# Patient Record
Sex: Male | Born: 1935
Health system: Southern US, Community
[De-identification: ages and names within clinical notes are randomized; demographics above are authoritative.]

## PROBLEM LIST (undated history)

## (undated) DIAGNOSIS — Z9289 Personal history of other medical treatment: Secondary | ICD-10-CM

## (undated) DIAGNOSIS — L0291 Cutaneous abscess, unspecified: Secondary | ICD-10-CM

## (undated) DIAGNOSIS — I219 Acute myocardial infarction, unspecified: Secondary | ICD-10-CM

## (undated) DIAGNOSIS — Z85038 Personal history of other malignant neoplasm of large intestine: Secondary | ICD-10-CM

## (undated) DIAGNOSIS — I482 Chronic atrial fibrillation, unspecified: Secondary | ICD-10-CM

## (undated) DIAGNOSIS — B957 Other staphylococcus as the cause of diseases classified elsewhere: Secondary | ICD-10-CM

## (undated) DIAGNOSIS — R55 Syncope and collapse: Secondary | ICD-10-CM

## (undated) DIAGNOSIS — Z86010 Personal history of colonic polyps: Secondary | ICD-10-CM

## (undated) DIAGNOSIS — I493 Ventricular premature depolarization: Secondary | ICD-10-CM

## (undated) DIAGNOSIS — I10 Essential (primary) hypertension: Secondary | ICD-10-CM

## (undated) DIAGNOSIS — E785 Hyperlipidemia, unspecified: Secondary | ICD-10-CM

## (undated) DIAGNOSIS — E079 Disorder of thyroid, unspecified: Secondary | ICD-10-CM

## (undated) DIAGNOSIS — T8149XA Infection following a procedure, other surgical site, initial encounter: Secondary | ICD-10-CM

## (undated) DIAGNOSIS — Z860101 Personal history of adenomatous and serrated colon polyps: Secondary | ICD-10-CM

## (undated) DIAGNOSIS — C801 Malignant (primary) neoplasm, unspecified: Secondary | ICD-10-CM

## (undated) HISTORY — PX: POLYPECTOMY: SHX149

## (undated) HISTORY — DX: Cutaneous abscess, unspecified: L02.91

## (undated) HISTORY — DX: Ventricular premature depolarization: I49.3

## (undated) HISTORY — DX: Infection following a procedure, other surgical site, initial encounter: T81.49XA

## (undated) HISTORY — DX: Other staphylococcus as the cause of diseases classified elsewhere: B95.7

## (undated) HISTORY — DX: Hyperlipidemia, unspecified: E78.5

## (undated) HISTORY — DX: Personal history of colonic polyps: Z86.010

## (undated) HISTORY — DX: Personal history of other medical treatment: Z92.89

## (undated) HISTORY — DX: Personal history of other malignant neoplasm of large intestine: Z85.038

## (undated) HISTORY — DX: Disorder of thyroid, unspecified: E07.9

## (undated) HISTORY — PX: INCISE AND DRAIN ABCESS: PRO64

## (undated) HISTORY — DX: Essential (primary) hypertension: I10

## (undated) HISTORY — PX: EYE SURGERY: SHX253

## (undated) HISTORY — DX: Syncope and collapse: R55

## (undated) HISTORY — DX: Chronic atrial fibrillation, unspecified: I48.20

## (undated) HISTORY — DX: Personal history of adenomatous and serrated colon polyps: Z86.0101

## (undated) HISTORY — DX: Malignant (primary) neoplasm, unspecified: C80.1

---

## 1975-05-16 HISTORY — PX: LUMBAR LAMINECTOMY: SHX95

## 1983-05-16 DIAGNOSIS — I219 Acute myocardial infarction, unspecified: Secondary | ICD-10-CM

## 1983-05-16 HISTORY — DX: Acute myocardial infarction, unspecified: I21.9

## 1998-05-15 DIAGNOSIS — Z85038 Personal history of other malignant neoplasm of large intestine: Secondary | ICD-10-CM

## 1998-05-15 HISTORY — DX: Personal history of other malignant neoplasm of large intestine: Z85.038

## 1998-05-15 HISTORY — PX: COLECTOMY: SHX59

## 1998-07-20 ENCOUNTER — Other Ambulatory Visit: Admission: RE | Admit: 1998-07-20 | Discharge: 1998-07-20 | Payer: Self-pay | Admitting: Gastroenterology

## 1998-07-22 ENCOUNTER — Inpatient Hospital Stay (HOSPITAL_COMMUNITY): Admission: RE | Admit: 1998-07-22 | Discharge: 1998-07-28 | Payer: Self-pay

## 1998-11-29 ENCOUNTER — Ambulatory Visit (HOSPITAL_COMMUNITY): Admission: RE | Admit: 1998-11-29 | Discharge: 1998-11-29 | Payer: Self-pay

## 1999-02-07 ENCOUNTER — Ambulatory Visit (HOSPITAL_COMMUNITY): Admission: RE | Admit: 1999-02-07 | Discharge: 1999-02-07 | Payer: Self-pay | Admitting: Oncology

## 1999-02-07 ENCOUNTER — Encounter: Payer: Self-pay | Admitting: Oncology

## 1999-04-07 ENCOUNTER — Emergency Department (HOSPITAL_COMMUNITY): Admission: EM | Admit: 1999-04-07 | Discharge: 1999-04-07 | Payer: Self-pay | Admitting: Emergency Medicine

## 1999-04-14 ENCOUNTER — Encounter (INDEPENDENT_AMBULATORY_CARE_PROVIDER_SITE_OTHER): Payer: Self-pay | Admitting: Specialist

## 1999-04-14 ENCOUNTER — Other Ambulatory Visit: Admission: RE | Admit: 1999-04-14 | Discharge: 1999-04-14 | Payer: Self-pay | Admitting: Gastroenterology

## 1999-05-05 ENCOUNTER — Ambulatory Visit (HOSPITAL_BASED_OUTPATIENT_CLINIC_OR_DEPARTMENT_OTHER): Admission: RE | Admit: 1999-05-05 | Discharge: 1999-05-05 | Payer: Self-pay

## 2000-01-12 ENCOUNTER — Encounter: Admission: RE | Admit: 2000-01-12 | Discharge: 2000-04-11 | Payer: Self-pay | Admitting: Oncology

## 2000-07-17 ENCOUNTER — Encounter: Payer: Self-pay | Admitting: Oncology

## 2000-07-17 ENCOUNTER — Ambulatory Visit (HOSPITAL_COMMUNITY): Admission: RE | Admit: 2000-07-17 | Discharge: 2000-07-17 | Payer: Self-pay | Admitting: Oncology

## 2000-08-29 ENCOUNTER — Encounter: Payer: Self-pay | Admitting: Urology

## 2000-08-29 ENCOUNTER — Ambulatory Visit (HOSPITAL_COMMUNITY): Admission: RE | Admit: 2000-08-29 | Discharge: 2000-08-29 | Payer: Self-pay | Admitting: Urology

## 2002-09-13 HISTORY — PX: HERNIA REPAIR: SHX51

## 2002-09-16 ENCOUNTER — Ambulatory Visit (HOSPITAL_COMMUNITY): Admission: RE | Admit: 2002-09-16 | Discharge: 2002-09-16 | Payer: Self-pay | Admitting: Surgery

## 2004-01-08 ENCOUNTER — Emergency Department (HOSPITAL_COMMUNITY): Admission: EM | Admit: 2004-01-08 | Discharge: 2004-01-08 | Payer: Self-pay | Admitting: Emergency Medicine

## 2004-05-15 HISTORY — PX: WOUND EXPLORATION: SHX2669

## 2004-07-21 ENCOUNTER — Encounter (INDEPENDENT_AMBULATORY_CARE_PROVIDER_SITE_OTHER): Payer: Self-pay | Admitting: *Deleted

## 2004-07-21 ENCOUNTER — Ambulatory Visit (HOSPITAL_BASED_OUTPATIENT_CLINIC_OR_DEPARTMENT_OTHER): Admission: RE | Admit: 2004-07-21 | Discharge: 2004-07-21 | Payer: Self-pay | Admitting: General Surgery

## 2004-07-21 ENCOUNTER — Ambulatory Visit (HOSPITAL_COMMUNITY): Admission: RE | Admit: 2004-07-21 | Discharge: 2004-07-21 | Payer: Self-pay | Admitting: General Surgery

## 2004-08-11 ENCOUNTER — Ambulatory Visit: Payer: Self-pay | Admitting: Oncology

## 2004-08-24 ENCOUNTER — Ambulatory Visit (HOSPITAL_COMMUNITY): Admission: RE | Admit: 2004-08-24 | Discharge: 2004-08-24 | Payer: Self-pay | Admitting: Oncology

## 2004-09-08 ENCOUNTER — Ambulatory Visit (HOSPITAL_COMMUNITY): Admission: RE | Admit: 2004-09-08 | Discharge: 2004-09-08 | Payer: Self-pay | Admitting: Oncology

## 2004-11-04 ENCOUNTER — Ambulatory Visit: Payer: Self-pay | Admitting: Oncology

## 2005-05-04 ENCOUNTER — Ambulatory Visit (HOSPITAL_COMMUNITY): Admission: RE | Admit: 2005-05-04 | Discharge: 2005-05-04 | Payer: Self-pay | Admitting: Oncology

## 2005-05-17 ENCOUNTER — Ambulatory Visit: Payer: Self-pay | Admitting: Gastroenterology

## 2005-06-01 ENCOUNTER — Ambulatory Visit: Payer: Self-pay | Admitting: Oncology

## 2005-07-04 ENCOUNTER — Ambulatory Visit: Payer: Self-pay | Admitting: Infectious Diseases

## 2005-11-20 ENCOUNTER — Ambulatory Visit: Payer: Self-pay | Admitting: Infectious Diseases

## 2006-01-24 ENCOUNTER — Ambulatory Visit: Payer: Self-pay | Admitting: Infectious Diseases

## 2006-02-15 ENCOUNTER — Encounter: Payer: Self-pay | Admitting: Infectious Diseases

## 2006-05-30 ENCOUNTER — Ambulatory Visit: Payer: Self-pay | Admitting: Oncology

## 2006-06-04 LAB — CBC WITH DIFFERENTIAL/PLATELET
BASO%: 1 % (ref 0.0–2.0)
EOS%: 1.9 % (ref 0.0–7.0)
HCT: 43.3 % (ref 38.7–49.9)
MCH: 30.1 pg (ref 28.0–33.4)
MCHC: 34.4 g/dL (ref 32.0–35.9)
MONO#: 0.3 10*3/uL (ref 0.1–0.9)
NEUT%: 56.9 % (ref 40.0–75.0)
RBC: 4.95 10*6/uL (ref 4.20–5.71)
RDW: 10.8 % — ABNORMAL LOW (ref 11.2–14.6)
WBC: 5.4 10*3/uL (ref 4.0–10.0)
lymph#: 1.8 10*3/uL (ref 0.9–3.3)

## 2006-06-04 LAB — COMPREHENSIVE METABOLIC PANEL
ALT: 26 U/L (ref 0–53)
AST: 23 U/L (ref 0–37)
Calcium: 9.6 mg/dL (ref 8.4–10.5)
Chloride: 104 mEq/L (ref 96–112)
Creatinine, Ser: 0.85 mg/dL (ref 0.40–1.50)
Sodium: 144 mEq/L (ref 135–145)
Total Protein: 6.5 g/dL (ref 6.0–8.3)

## 2006-06-04 LAB — LACTATE DEHYDROGENASE: LDH: 154 U/L (ref 94–250)

## 2006-12-12 ENCOUNTER — Ambulatory Visit (HOSPITAL_COMMUNITY): Admission: RE | Admit: 2006-12-12 | Discharge: 2006-12-12 | Payer: Self-pay | Admitting: Infectious Diseases

## 2006-12-12 ENCOUNTER — Ambulatory Visit: Payer: Self-pay | Admitting: Infectious Diseases

## 2006-12-12 DIAGNOSIS — B957 Other staphylococcus as the cause of diseases classified elsewhere: Secondary | ICD-10-CM | POA: Insufficient documentation

## 2006-12-12 DIAGNOSIS — S0000XA Unspecified superficial injury of scalp, initial encounter: Secondary | ICD-10-CM | POA: Insufficient documentation

## 2006-12-12 DIAGNOSIS — S1090XA Unspecified superficial injury of unspecified part of neck, initial encounter: Secondary | ICD-10-CM

## 2006-12-12 DIAGNOSIS — S0080XA Unspecified superficial injury of other part of head, initial encounter: Secondary | ICD-10-CM

## 2006-12-12 DIAGNOSIS — G4700 Insomnia, unspecified: Secondary | ICD-10-CM | POA: Insufficient documentation

## 2006-12-12 HISTORY — DX: Other staphylococcus as the cause of diseases classified elsewhere: B95.7

## 2007-05-16 HISTORY — PX: COLONOSCOPY: SHX174

## 2007-05-23 ENCOUNTER — Ambulatory Visit: Payer: Self-pay | Admitting: Gastroenterology

## 2007-05-30 ENCOUNTER — Ambulatory Visit: Payer: Self-pay | Admitting: Oncology

## 2007-05-31 ENCOUNTER — Ambulatory Visit: Payer: Self-pay | Admitting: Gastroenterology

## 2007-05-31 ENCOUNTER — Encounter: Payer: Self-pay | Admitting: Gastroenterology

## 2007-06-04 LAB — COMPREHENSIVE METABOLIC PANEL
ALT: 42 U/L (ref 0–53)
AST: 33 U/L (ref 0–37)
Calcium: 9.5 mg/dL (ref 8.4–10.5)
Chloride: 104 mEq/L (ref 96–112)
Creatinine, Ser: 0.96 mg/dL (ref 0.40–1.50)
Sodium: 142 mEq/L (ref 135–145)
Total Protein: 6.9 g/dL (ref 6.0–8.3)

## 2007-06-04 LAB — CBC WITH DIFFERENTIAL/PLATELET
BASO%: 0.4 % (ref 0.0–2.0)
Basophils Absolute: 0 10*3/uL (ref 0.0–0.1)
EOS%: 2.3 % (ref 0.0–7.0)
Eosinophils Absolute: 0.1 10*3/uL (ref 0.0–0.5)
HCT: 45.6 % (ref 38.7–49.9)
HGB: 15.5 g/dL (ref 13.0–17.1)
LYMPH%: 30 % (ref 14.0–48.0)
MCH: 29.3 pg (ref 28.0–33.4)
MCHC: 34 g/dL (ref 32.0–35.9)
MCV: 86.1 fL (ref 81.6–98.0)
MONO#: 0.5 10*3/uL (ref 0.1–0.9)
MONO%: 8.6 % (ref 0.0–13.0)
NEUT#: 3.6 10*3/uL (ref 1.5–6.5)
NEUT%: 58.7 % (ref 40.0–75.0)
Platelets: 235 10*3/uL (ref 145–400)
RBC: 5.3 10*6/uL (ref 4.20–5.71)
RDW: 12.7 % (ref 11.2–14.6)
WBC: 6.2 10*3/uL (ref 4.0–10.0)
lymph#: 1.8 10*3/uL (ref 0.9–3.3)

## 2007-06-04 LAB — CEA: CEA: 0.9 ng/mL (ref 0.0–5.0)

## 2008-01-28 ENCOUNTER — Encounter: Admission: RE | Admit: 2008-01-28 | Discharge: 2008-01-28 | Payer: Self-pay | Admitting: Internal Medicine

## 2008-05-29 ENCOUNTER — Ambulatory Visit: Payer: Self-pay | Admitting: Oncology

## 2008-06-02 LAB — COMPREHENSIVE METABOLIC PANEL
ALT: 52 U/L (ref 0–53)
Alkaline Phosphatase: 57 U/L (ref 39–117)
Creatinine, Ser: 0.53 mg/dL (ref 0.40–1.50)
Sodium: 143 mEq/L (ref 135–145)
Total Bilirubin: 0.9 mg/dL (ref 0.3–1.2)
Total Protein: 6.1 g/dL (ref 6.0–8.3)

## 2008-06-02 LAB — CBC WITH DIFFERENTIAL/PLATELET
BASO%: 0.5 % (ref 0.0–2.0)
EOS%: 1.1 % (ref 0.0–7.0)
HCT: 42.1 % (ref 38.7–49.9)
HGB: 14 g/dL (ref 13.0–17.1)
LYMPH%: 37.5 % (ref 14.0–48.0)
MONO#: 0.6 10*3/uL (ref 0.1–0.9)
MONO%: 16.4 % — ABNORMAL HIGH (ref 0.0–13.0)
NEUT#: 1.7 10*3/uL (ref 1.5–6.5)
RDW: 11.4 % (ref 11.2–14.6)
lymph#: 1.4 10*3/uL (ref 0.9–3.3)

## 2008-06-09 ENCOUNTER — Encounter: Payer: Self-pay | Admitting: Gastroenterology

## 2008-06-09 LAB — MORPHOLOGY
PLT EST: ADEQUATE
RBC Comments: NORMAL

## 2008-06-09 LAB — CBC WITH DIFFERENTIAL/PLATELET
BASO%: 0.4 % (ref 0.0–2.0)
Eosinophils Absolute: 0 10*3/uL (ref 0.0–0.5)
MCHC: 34.1 g/dL (ref 32.0–35.9)
MONO#: 0.8 10*3/uL (ref 0.1–0.9)
NEUT#: 2 10*3/uL (ref 1.5–6.5)
Platelets: 152 10*3/uL (ref 145–400)
RBC: 4.89 10*6/uL (ref 4.20–5.71)
RDW: 11.6 % (ref 11.2–14.6)
WBC: 4.4 10*3/uL (ref 4.0–10.0)
lymph#: 1.4 10*3/uL (ref 0.9–3.3)

## 2008-06-09 LAB — CHCC SMEAR

## 2008-06-17 ENCOUNTER — Encounter (HOSPITAL_COMMUNITY): Admission: RE | Admit: 2008-06-17 | Discharge: 2008-09-15 | Payer: Self-pay | Admitting: Endocrinology

## 2008-07-06 LAB — CBC WITH DIFFERENTIAL/PLATELET
Basophils Absolute: 0 10*3/uL (ref 0.0–0.1)
Eosinophils Absolute: 0.1 10*3/uL (ref 0.0–0.5)
HCT: 40.5 % (ref 38.4–49.9)
HGB: 13.7 g/dL (ref 13.0–17.1)
LYMPH%: 31.9 % (ref 14.0–49.0)
MONO#: 0.5 10*3/uL (ref 0.1–0.9)
NEUT#: 2.3 10*3/uL (ref 1.5–6.5)
NEUT%: 53.7 % (ref 39.0–75.0)
Platelets: 160 10*3/uL (ref 140–400)
WBC: 4.3 10*3/uL (ref 4.0–10.3)
lymph#: 1.4 10*3/uL (ref 0.9–3.3)

## 2008-07-06 LAB — CHCC SMEAR

## 2008-07-06 LAB — HEPATIC FUNCTION PANEL
Albumin: 3.8 g/dL (ref 3.5–5.2)
Total Bilirubin: 0.6 mg/dL (ref 0.3–1.2)
Total Protein: 6 g/dL (ref 6.0–8.3)

## 2008-07-06 LAB — MORPHOLOGY: PLT EST: ADEQUATE

## 2008-12-04 ENCOUNTER — Encounter: Payer: Self-pay | Admitting: Family Medicine

## 2009-04-26 ENCOUNTER — Encounter: Admission: RE | Admit: 2009-04-26 | Discharge: 2009-04-26 | Payer: Self-pay | Admitting: *Deleted

## 2009-05-28 ENCOUNTER — Ambulatory Visit: Payer: Self-pay | Admitting: Oncology

## 2009-06-08 ENCOUNTER — Encounter: Payer: Self-pay | Admitting: Gastroenterology

## 2009-06-08 LAB — MORPHOLOGY: PLT EST: ADEQUATE

## 2009-06-08 LAB — CBC WITH DIFFERENTIAL/PLATELET
Basophils Absolute: 0 10*3/uL (ref 0.0–0.1)
EOS%: 2.5 % (ref 0.0–7.0)
HGB: 16.2 g/dL (ref 13.0–17.1)
LYMPH%: 32.1 % (ref 14.0–49.0)
MCH: 29.5 pg (ref 27.2–33.4)
MCV: 88 fL (ref 79.3–98.0)
MONO#: 0.7 10*3/uL (ref 0.1–0.9)
MONO%: 10.7 % (ref 0.0–14.0)
NEUT#: 3.6 10*3/uL (ref 1.5–6.5)
NEUT%: 54.2 % (ref 39.0–75.0)
Platelets: 166 10*3/uL (ref 140–400)
WBC: 6.6 10*3/uL (ref 4.0–10.3)
lymph#: 2.1 10*3/uL (ref 0.9–3.3)

## 2009-06-08 LAB — COMPREHENSIVE METABOLIC PANEL
BUN: 14 mg/dL (ref 6–23)
CO2: 32 mEq/L (ref 19–32)
Calcium: 10.2 mg/dL (ref 8.4–10.5)
Chloride: 103 mEq/L (ref 96–112)
Creatinine, Ser: 0.99 mg/dL (ref 0.40–1.50)
Total Bilirubin: 0.8 mg/dL (ref 0.3–1.2)

## 2009-12-23 ENCOUNTER — Ambulatory Visit: Payer: Self-pay | Admitting: Cardiovascular Disease

## 2010-01-04 ENCOUNTER — Ambulatory Visit: Payer: Self-pay | Admitting: Cardiovascular Disease

## 2010-01-21 ENCOUNTER — Ambulatory Visit: Payer: Self-pay | Admitting: Cardiovascular Disease

## 2010-03-07 ENCOUNTER — Ambulatory Visit: Payer: Self-pay | Admitting: Cardiology

## 2010-03-21 ENCOUNTER — Ambulatory Visit: Payer: Self-pay | Admitting: Cardiovascular Disease

## 2010-03-22 ENCOUNTER — Ambulatory Visit: Payer: Self-pay | Admitting: Cardiovascular Disease

## 2010-04-14 ENCOUNTER — Ambulatory Visit: Payer: Self-pay | Admitting: Cardiovascular Disease

## 2010-05-27 ENCOUNTER — Ambulatory Visit: Payer: Self-pay | Admitting: Oncology

## 2010-05-31 LAB — CBC WITH DIFFERENTIAL/PLATELET
BASO%: 0.6 % (ref 0.0–2.0)
Basophils Absolute: 0 10*3/uL (ref 0.0–0.1)
EOS%: 1.8 % (ref 0.0–7.0)
Eosinophils Absolute: 0.1 10*3/uL (ref 0.0–0.5)
HCT: 45.2 % (ref 38.4–49.9)
HGB: 15.3 g/dL (ref 13.0–17.1)
LYMPH%: 32.8 % (ref 14.0–49.0)
MCH: 30 pg (ref 27.2–33.4)
MCHC: 33.9 g/dL (ref 32.0–36.0)
MCV: 88.6 fL (ref 79.3–98.0)
MONO#: 0.6 10*3/uL (ref 0.1–0.9)
MONO%: 11.9 % (ref 0.0–14.0)
NEUT#: 2.9 10*3/uL (ref 1.5–6.5)
NEUT%: 52.9 % (ref 39.0–75.0)
Platelets: 154 10*3/uL (ref 140–400)
RBC: 5.1 10*6/uL (ref 4.20–5.82)
RDW: 12.9 % (ref 11.0–14.6)
WBC: 5.5 10*3/uL (ref 4.0–10.3)
lymph#: 1.8 10*3/uL (ref 0.9–3.3)

## 2010-05-31 LAB — COMPREHENSIVE METABOLIC PANEL
ALT: 20 U/L (ref 0–53)
AST: 24 U/L (ref 0–37)
Albumin: 4.5 g/dL (ref 3.5–5.2)
Alkaline Phosphatase: 46 U/L (ref 39–117)
BUN: 15 mg/dL (ref 6–23)
CO2: 24 mEq/L (ref 19–32)
Calcium: 10 mg/dL (ref 8.4–10.5)
Chloride: 104 mEq/L (ref 96–112)
Creatinine, Ser: 0.98 mg/dL (ref 0.40–1.50)
Glucose, Bld: 131 mg/dL — ABNORMAL HIGH (ref 70–99)
Potassium: 4 mEq/L (ref 3.5–5.3)
Sodium: 140 mEq/L (ref 135–145)
Total Bilirubin: 0.7 mg/dL (ref 0.3–1.2)
Total Protein: 6.4 g/dL (ref 6.0–8.3)

## 2010-05-31 LAB — PSA: PSA: 1.14 ng/mL (ref <=4.00)

## 2010-05-31 LAB — CEA: CEA: 1.1 ng/mL (ref 0.0–5.0)

## 2010-05-31 LAB — LACTATE DEHYDROGENASE: LDH: 134 U/L (ref 94–250)

## 2010-06-14 NOTE — Progress Notes (Signed)
Summary: Office Visit  Office Visit   Imported By: Randon Goldsmith 12/14/2006 12:56:47  _____________________________________________________________________  External Attachment:    Type:   Image     Comment:   External Document

## 2010-06-14 NOTE — Letter (Signed)
[facility]   Imported By: Sherian Rein 06/24/2009 12:26:01  _____________________________________________________________________  External Attachment:    Type:   Image     Comment:   External Document

## 2010-06-20 ENCOUNTER — Encounter (HOSPITAL_BASED_OUTPATIENT_CLINIC_OR_DEPARTMENT_OTHER): Payer: Medicare Other | Admitting: Oncology

## 2010-06-20 ENCOUNTER — Encounter: Payer: Self-pay | Admitting: Gastroenterology

## 2010-06-20 DIAGNOSIS — Z85038 Personal history of other malignant neoplasm of large intestine: Secondary | ICD-10-CM

## 2010-07-25 ENCOUNTER — Encounter (INDEPENDENT_AMBULATORY_CARE_PROVIDER_SITE_OTHER): Payer: BLUE CROSS/BLUE SHIELD

## 2010-07-25 DIAGNOSIS — Z7901 Long term (current) use of anticoagulants: Secondary | ICD-10-CM

## 2010-07-25 DIAGNOSIS — I4891 Unspecified atrial fibrillation: Secondary | ICD-10-CM

## 2010-07-26 NOTE — Letter (Signed)
Summary: [facility]  [facility]   Imported By: Sherian Rein 07/20/2010 14:37:20  _____________________________________________________________________  External Attachment:    Type:   Image     Comment:   External Document

## 2010-08-01 ENCOUNTER — Encounter (INDEPENDENT_AMBULATORY_CARE_PROVIDER_SITE_OTHER): Payer: Medicare Other

## 2010-08-01 DIAGNOSIS — I4891 Unspecified atrial fibrillation: Secondary | ICD-10-CM

## 2010-08-01 DIAGNOSIS — Z7901 Long term (current) use of anticoagulants: Secondary | ICD-10-CM

## 2010-08-08 ENCOUNTER — Ambulatory Visit (INDEPENDENT_AMBULATORY_CARE_PROVIDER_SITE_OTHER): Payer: Medicare Other | Admitting: *Deleted

## 2010-08-08 DIAGNOSIS — Z7901 Long term (current) use of anticoagulants: Secondary | ICD-10-CM

## 2010-08-08 DIAGNOSIS — I4891 Unspecified atrial fibrillation: Secondary | ICD-10-CM | POA: Insufficient documentation

## 2010-08-08 LAB — POCT INR: INR: 2.6

## 2010-08-22 ENCOUNTER — Ambulatory Visit (INDEPENDENT_AMBULATORY_CARE_PROVIDER_SITE_OTHER): Payer: Medicare Other | Admitting: *Deleted

## 2010-08-22 DIAGNOSIS — I4891 Unspecified atrial fibrillation: Secondary | ICD-10-CM

## 2010-08-22 DIAGNOSIS — Z7901 Long term (current) use of anticoagulants: Secondary | ICD-10-CM

## 2010-09-12 ENCOUNTER — Encounter: Payer: Medicare Other | Admitting: *Deleted

## 2010-09-12 ENCOUNTER — Ambulatory Visit (INDEPENDENT_AMBULATORY_CARE_PROVIDER_SITE_OTHER): Payer: Medicare Other | Admitting: *Deleted

## 2010-09-12 DIAGNOSIS — Z7901 Long term (current) use of anticoagulants: Secondary | ICD-10-CM

## 2010-09-12 DIAGNOSIS — I4891 Unspecified atrial fibrillation: Secondary | ICD-10-CM

## 2010-09-12 LAB — POCT INR: INR: 2.8

## 2010-09-30 NOTE — Op Note (Signed)
[facility]  Patient:    Christian Maxwell, Christian Maxwell                   MRN: [id number] Proc. Date: 08/29/00 Adm. Date:  [id number] Attending:  Thermon Leyland                           Operative Report  PREOPERATIVE DIAGNOSIS:  Symptomatic left hydrocele.  POSTOPERATIVE DIAGNOSIS:  Symptomatic left hydrocele.  PROCEDURE:  Left hydrocele repair.  SURGEON:  Dr. Isabel Caprice.  ANESTHESIA:  IV sedation with spermatic cord block.  INDICATIONS FOR PROCEDURE:  [name] is a 77 year old male. He has had a longstanding complaint of some left scrotal swelling. We have diagnosed him with what appears to be a simple unilocular hydrocele. He had no obvious testicular abnormality on ultrasound. We had previously recommended that he simply observe this unless it became more bothersome to him. He recently returned for evaluation and felt that this was getting larger and becoming more annoying to him. After discussion, he elected to have this repaired. Pros and cons of the surgical approach as well as the potential complications were all discussed with him and full informed consent was obtained.  TECHNIQUE AND FINDINGS:  The patient was brought to the operating room. He had some intravenous sedation and after prepping he had a spermatic cord block and local infiltration of the scrotal skin with a combination of Marcaine and lidocaine. A small horizontal incision was made in the left hemiscrotum. This was carried down to the level of the tunica vaginalis and the hydrocele sac was entered. The tunica vaginalis was markedly thickened and at least several times normal thickness. Approximately 200 cc of straw colored hydrocele fluid was obtained. The testicle was then brought out of the incision and carefully examined. The testicle and epididymal structure showed no significant abnormalities. Again the patient had a fairly thick hydrocele sac with quite a lot of adhesions to  the surrounding spermatic cord structures. A small amount of redundant sac was dissected free and trimmed with electrocautery. The remaining edges of the hydrocele sac were then reefed in a lord manner with some 3-0 Vicryl suture. Hemostasis was quite good. The testicle was then returned to the hemiscrotum making sure that the spermatic cord was not twisted in any way. The dartos layer was closed with a running #0 Vicryl suture and the skin was closed with interrupted 4-0 Vicryl. A clear plastic dressing was applied and then fluffs were used for some additional scrotal support. The patient appeared to tolerate the procedure well and there were no obvious complications. DD:  08/29/00 TD:  08/29/00 Job: [id number]

## 2010-09-30 NOTE — Op Note (Signed)
NAMEMatias, [name]               ACCOUNT NO.:  [id number]   MEDICAL RECORD NO.:  [id number]          PATIENT TYPE:  AMB   LOCATION:  DSC                          FACILITY:  [facility]   PHYSICIAN:  Gabrielle Dare. [name], M.D.DATE OF BIRTH:  07-Dec-1932   DATE OF PROCEDURE:  07/21/2004  DATE OF DISCHARGE:                                 OPERATIVE REPORT   Dr. transecting   PREOPERATIVE DIAGNOSIS:  Stitch abscess abdominal wall.   POSTOPERATIVE DIAGNOSIS:  Stitch abscess abdominal wall.   PROCEDURE:  Excision stitch abscess abdominal wall.   SURGEON:  Gabrielle Dare. [name], M.D.   ANESTHESIA:  MAC.   HISTORY OF PRESENT ILLNESS:  The patient is a 77 year old white male who  underwent colectomy by Dr. Milus Mallick for colon cancer about five  years ago.  Dr. Magnus Ivan and myself had seen him in our office since that  time for a stitch abscess in the lower third of his the incision.  There he  did not respond to conservative treatment. He initially put off having  surgery but returned to the office in order to schedule elective excision of  this stitch abscess. He continues to have some irritation at the area.   PROCEDURE IN DETAIL:  Informed consent was obtained.  The patient was  identified and received intravenous antibiotics. He was brought to the  operating room.  MAC anesthesia was administered.  A 50/50 mixture of a  0.25% Marcaine with epinephrine and 1% lidocaine was injected into the  surrounding area.  An elliptical incision was made encompassing this 1 cm  bud of granulation tissue. Subcutaneous tissues were dissected down to the  anterior fascia. This stitch abscess was excised in one piece. It seems that  most of the suture has dissolved.  The area was traced down and taken down  and cut off right at the anterior fascia.  There seemed to be a at least a  portion of a stitched within that was divided.  The specimen was sent to  pathology. No other stitch or abnormality was  visible along its fascia.  The  area was copiously irrigated.  Some additional local anesthetic was injected  and the deep subcutaneous tissues were approximated with interrupted 3-0  Vicryl sutures. The superficial subcutaneous tissues were also approximated  with some interrupted 3-0 Vicryl suture sparingly and then the skin was  closed with interrupted 3-0 nylon sutures after insuring excellent  hemostasis. Sponge, needle and instrument counts were correct. A sterile  dressing was applied. The patient tolerated procedure well without apparent  complication and was taken to the recovery room in stable condition.     BET/MEDQ  D:  07/21/2004  T:  07/21/2004  Job:  [id number]

## 2010-09-30 NOTE — Op Note (Signed)
NAMEDARRYEL, DIODATO                         ACCOUNT NO.:  [id number]   MEDICAL RECORD NO.:  [id number]                   PATIENT TYPE:  OIB   LOCATION:  2874                                 FACILITY:  [facility]   PHYSICIAN:  Abigail Miyamoto, M.D.              DATE OF BIRTH:  02-Apr-1933   DATE OF PROCEDURE:  09/16/2002  DATE OF DISCHARGE:                                 OPERATIVE REPORT   PREOPERATIVE DIAGNOSIS:  Right inguinal hernia.   POSTOPERATIVE DIAGNOSIS:  Right inguinal hernia.   OPERATION PERFORMED:  Right inguinal hernia repair with mesh.   SURGEON:  Douglas A. Magnus Ivan, M.D.   ANESTHESIA:  General endotracheal.   ESTIMATED BLOOD LOSS:  Minimal.   DESCRIPTION OF PROCEDURE:  The patient was brought to the operating room and  identified.  He was placed supine on the operating table and general  anesthesia was induced.  His right groin and abdomen was prepped and draped  in the usual sterile fashion.  Using a 10 blade, a small longitudinal  incision was made in the patient's right lower quadrant.  The incision was  carried down through Scarpa's fascia with electrocautery.  The external  oblique fascia was then identified and opened with a scalpel and further  toward the external ring with the Metzenbaum scissors.  The underlying  testicular cord and structures were identified and controlled with a Penrose  drain.  The patient was found to have a large indirect inguinal hernia with  a chronic, thick-appearing sac.  The hernia sac and its contents were  separated from the surrounding cord structures.  The sac was opened up and  found to contain only some omentum.  The entire sac and omentum were reduced  back into the abdominal cavity.  Next, the preperitoneal space was dissected  out with a Ray-Tec sponge.  A piece of Prolene mesh from the [employer] Prolene  hernia system was brought onto the field.  A large piece of mesh was then  used.  The preperitoneal portion was  placed into the preperitoneal space and  unfolded.  The extraperitoneal portion was opened up into the inguinal  floor.  A small slit was cut in the mesh.  It was incorporated around the  cord structures.  The mesh was then sewn in place circumferentially with  interrupted 2-0 Vicryl sutures sewing it to the pubic tubercle, the shelving  edge of the inguinal ligament, transversalis fascia and out laterally.  Good  coverage of the inguinal floor appeared to be achieved.  The external  oblique fascia was then closed with running 2-0 Vicryl suture.  Scarpa's  fascia was then closed with interrupted 3-0 Vicryl sutures.  The wound was  then anesthetized with 0.5% Marcaine.  The skin was then closed with running  4-0 Monocryl.  Steri-Strips, gauze and tape were then applied.  The patient  tolerated the procedure well.  All sponge, needle  and instrument counts were  correct at the end of the procedure.  The patient was then extubated in the  operating room and taken in stable condition to the recovery room.                                                  Abigail Miyamoto, M.D.   DB/MEDQ  D:  09/16/2002  T:  09/16/2002  Job:  [id number]

## 2010-09-30 NOTE — Op Note (Signed)
. [facility]  Patient:    Christian Maxwell, Christian Maxwell                        MRN: [id number] Proc. Date: 11/29/98 Attending:  Milus Mallick, M.D.                           Operative Report  [facility] [place] NUMBER:  [id number].  PREOPERATIVE DIAGNOSIS:  Carcinoma of the colon.  POSTOPERATIVE DIAGNOSIS:  Carcinoma of the colon.  PROCEDURE:  Insertion of Bard subcutaneous port.  SURGEON:  Milus Mallick, M.D.   NO FURTHER DICTATION - STATIC DD:  11/29/98 TD:  11/29/98 Job: [id number]

## 2010-10-13 ENCOUNTER — Other Ambulatory Visit: Payer: Self-pay | Admitting: *Deleted

## 2010-10-13 ENCOUNTER — Encounter: Payer: Medicare Other | Admitting: *Deleted

## 2010-10-13 DIAGNOSIS — E785 Hyperlipidemia, unspecified: Secondary | ICD-10-CM

## 2010-10-13 DIAGNOSIS — Z79899 Other long term (current) drug therapy: Secondary | ICD-10-CM

## 2010-10-14 ENCOUNTER — Encounter: Payer: Self-pay | Admitting: Cardiovascular Disease

## 2010-10-14 ENCOUNTER — Ambulatory Visit (INDEPENDENT_AMBULATORY_CARE_PROVIDER_SITE_OTHER): Payer: Medicare Other | Admitting: Cardiovascular Disease

## 2010-10-14 ENCOUNTER — Other Ambulatory Visit (INDEPENDENT_AMBULATORY_CARE_PROVIDER_SITE_OTHER): Payer: Medicare Other | Admitting: *Deleted

## 2010-10-14 VITALS — BP 110/66 | HR 64 | Ht 70.0 in | Wt 195.6 lb

## 2010-10-14 DIAGNOSIS — E785 Hyperlipidemia, unspecified: Secondary | ICD-10-CM

## 2010-10-14 DIAGNOSIS — I4891 Unspecified atrial fibrillation: Secondary | ICD-10-CM

## 2010-10-14 DIAGNOSIS — Z79899 Other long term (current) drug therapy: Secondary | ICD-10-CM

## 2010-10-14 DIAGNOSIS — Z7901 Long term (current) use of anticoagulants: Secondary | ICD-10-CM

## 2010-10-14 LAB — LIPID PANEL
Cholesterol: 129 mg/dL (ref 0–200)
HDL: 43.9 mg/dL (ref >39.00)
LDL Cholesterol: 58 mg/dL (ref 0–99)
Total CHOL/HDL Ratio: 3

## 2010-10-14 LAB — BASIC METABOLIC PANEL
BUN: 20 mg/dL (ref 6–23)
CO2: 26 mEq/L (ref 19–32)
Chloride: 108 mEq/L (ref 96–112)
Creatinine, Ser: 0.7 mg/dL (ref 0.4–1.5)
Glucose, Bld: 106 mg/dL — ABNORMAL HIGH (ref 70–99)
Sodium: 139 mEq/L (ref 135–145)

## 2010-10-14 LAB — CBC WITH DIFFERENTIAL/PLATELET
Basophils Relative: 0.4 % (ref 0.0–3.0)
Eosinophils Relative: 1.1 % (ref 0.0–5.0)
Hemoglobin: 15.1 g/dL (ref 13.0–17.0)
Lymphocytes Relative: 20.5 % (ref 12.0–46.0)
Lymphs Abs: 2 10*3/uL (ref 0.7–4.0)
Monocytes Absolute: 1 10*3/uL (ref 0.1–1.0)
Monocytes Relative: 10.6 % (ref 3.0–12.0)
Neutro Abs: 6.5 10*3/uL (ref 1.4–7.7)
RBC: 5.02 Mil/uL (ref 4.22–5.81)

## 2010-10-14 LAB — HEPATIC FUNCTION PANEL
Albumin: 3.7 g/dL (ref 3.5–5.2)
Total Bilirubin: 1.3 mg/dL — ABNORMAL HIGH (ref 0.3–1.2)
Total Protein: 6.3 g/dL (ref 6.0–8.3)

## 2010-10-14 LAB — POCT INR: INR: 1.5

## 2010-10-14 NOTE — Assessment & Plan Note (Addendum)
His atrial fibrillation is very well controlled. We will continue with his same medications.  His INR was only 1.5 today but he skipped several doses. I've encouraged him to get back on his medicine. We'll check his  INR in Coumadin clinic.

## 2010-10-14 NOTE — Patient Instructions (Signed)
Try Zertec ( certrizine) for allergies

## 2010-10-14 NOTE — Progress Notes (Signed)
Christian Maxwell Date of Birth  1933/05/12 [facility] [address]     [address] [place], [place]  [address] [phone]  Fax  [phone]  History of Present Illness:  Christian Maxwell is an elderly gentleman with a history of atrial fibrillation, hypothyroidism, and hyperlipidemia.  He presents today for followup of these issues. He does not have any complaints today.  Current Outpatient Prescriptions  Medication Sig Dispense Refill  . aspirin 81 MG tablet Take 81 mg by mouth daily.        [name] atorvastatin (LIPITOR) 40 MG tablet Take 40 mg by mouth daily.        . calcium carbonate (OS-CAL - DOSED IN MG OF ELEMENTAL CALCIUM) 1250 MG tablet Take 1 tablet by mouth daily.        [name] CALCIUM PO Take by mouth daily.        . fish oil-omega-3 fatty acids 1000 MG capsule Take by mouth daily.        [name] FOLIC ACID PO Take by mouth daily.        . metoprolol (LOPRESSOR) 50 MG tablet Take 50 mg by mouth 2 (two) times daily.        . Thiamine HCl (VITAMIN B-1 PO) Take by mouth daily.        [name] warfarin (COUMADIN) 5 MG tablet Take 5 mg by mouth as directed.        . Zolpidem Tartrate (AMBIEN PO) Take by mouth at bedtime.        [name] DISCONTD: dabigatran (PRADAXA) 150 MG CAPS Take 150 mg by mouth every 12 (twelve) hours.        [name] DISCONTD: doxycycline (DORYX) 100 MG DR capsule Take 100 mg by mouth daily.           No Known Allergies  Past Medical History  Diagnosis Date  . Hypertension   . Hyperlipidemia   . Syncope and collapse   . Atrial fibrillation   . Chest pain   . Dyspnea   . PVC's (premature ventricular contractions)     Past Surgical History  Procedure Date  . Lumbar laminectomy 1977  . Colon surgery 2000    History  Smoking status  . Former Smoker  . Quit date: 10/13/1977  Smokeless tobacco  . Not on file    History  Alcohol Use No    Family History  Problem Relation Age of Onset  . Stroke Mother   . Stroke Father   . Hypertension  Father   . Heart attack Father   . Stroke Sister   . Stroke Brother   . Stroke Brother     Reviw of Systems:  Reviewed in the HPI.  All other systems are negative.  Physical Exam: BP 110/66  Pulse 64  Ht 5\' 10"  (1.778 m)  Wt 195 lb 9.6 oz (88.724 kg)  BMI 28.07 kg/m2 The patient is alert and oriented x 3.  The mood and affect are normal.  The skin is warm and dry.  Color is normal.  The HEENT exam reveals that the sclera are nonicteric.  The mucous membranes are moist.  The carotids are 2+ without bruits.  There is no thyromegaly.  There is no JVD.  The lungs are clear.  The chest wall is non tender.  The heart exam reveals an irregular rate with a normal S1 and S2.  There are no murmurs, gallops, or rubs.  The PMI is not  displaced.   Abdominal exam reveals good bowel sounds.  There is no guarding or rebound.  There is no hepatosplenomegaly or tenderness.  There are no masses.  Exam of the legs reveal no clubbing, cyanosis, or edema.  The legs are without rashes.  The distal pulses are intact.  Cranial nerves II - XII are intact.  Motor and sensory functions are intact.  The gait is normal.  Assessment / Plan:

## 2010-10-14 NOTE — Assessment & Plan Note (Signed)
He continue with his same dose of atorvastatin we'll check a lipid level and basic metabolic profile, HFP at his next visit.

## 2010-10-17 ENCOUNTER — Ambulatory Visit (INDEPENDENT_AMBULATORY_CARE_PROVIDER_SITE_OTHER): Payer: Self-pay | Admitting: *Deleted

## 2010-10-17 DIAGNOSIS — R0989 Other specified symptoms and signs involving the circulatory and respiratory systems: Secondary | ICD-10-CM

## 2010-10-17 LAB — POCT INR: INR: 1.5

## 2010-10-18 NOTE — Progress Notes (Signed)
Pt called with normal results Pt verbalized understanding. Alfonso Ramus RN

## 2010-10-21 ENCOUNTER — Ambulatory Visit (INDEPENDENT_AMBULATORY_CARE_PROVIDER_SITE_OTHER): Payer: Medicare Other | Admitting: *Deleted

## 2010-10-21 DIAGNOSIS — I4891 Unspecified atrial fibrillation: Secondary | ICD-10-CM

## 2010-10-21 DIAGNOSIS — Z7901 Long term (current) use of anticoagulants: Secondary | ICD-10-CM

## 2010-11-11 ENCOUNTER — Ambulatory Visit (INDEPENDENT_AMBULATORY_CARE_PROVIDER_SITE_OTHER): Payer: Medicare Other | Admitting: *Deleted

## 2010-11-11 DIAGNOSIS — Z7901 Long term (current) use of anticoagulants: Secondary | ICD-10-CM

## 2010-11-11 DIAGNOSIS — I4891 Unspecified atrial fibrillation: Secondary | ICD-10-CM

## 2010-12-09 ENCOUNTER — Ambulatory Visit (INDEPENDENT_AMBULATORY_CARE_PROVIDER_SITE_OTHER): Payer: Medicare Other | Admitting: *Deleted

## 2010-12-09 DIAGNOSIS — Z7901 Long term (current) use of anticoagulants: Secondary | ICD-10-CM

## 2010-12-09 DIAGNOSIS — I4891 Unspecified atrial fibrillation: Secondary | ICD-10-CM

## 2011-01-06 ENCOUNTER — Encounter: Payer: Medicare Other | Admitting: *Deleted

## 2011-01-06 ENCOUNTER — Ambulatory Visit (INDEPENDENT_AMBULATORY_CARE_PROVIDER_SITE_OTHER): Payer: Medicare Other | Admitting: *Deleted

## 2011-01-06 DIAGNOSIS — Z7901 Long term (current) use of anticoagulants: Secondary | ICD-10-CM

## 2011-01-06 DIAGNOSIS — I4891 Unspecified atrial fibrillation: Secondary | ICD-10-CM

## 2011-01-06 LAB — POCT INR: INR: 2.3

## 2011-01-18 ENCOUNTER — Ambulatory Visit (INDEPENDENT_AMBULATORY_CARE_PROVIDER_SITE_OTHER): Payer: Medicare Other | Admitting: General Surgery

## 2011-01-18 ENCOUNTER — Encounter (INDEPENDENT_AMBULATORY_CARE_PROVIDER_SITE_OTHER): Payer: Self-pay | Admitting: General Surgery

## 2011-01-18 VITALS — BP 118/68 | HR 68 | Temp 97.2°F | Ht 70.0 in | Wt 196.4 lb

## 2011-01-18 DIAGNOSIS — L02219 Cutaneous abscess of trunk, unspecified: Secondary | ICD-10-CM

## 2011-01-18 DIAGNOSIS — L03319 Cellulitis of trunk, unspecified: Secondary | ICD-10-CM

## 2011-01-18 DIAGNOSIS — L02211 Cutaneous abscess of abdominal wall: Secondary | ICD-10-CM

## 2011-01-18 NOTE — Patient Instructions (Signed)
Tomorrow her removed and the wound packing. Washing the shower daily and keep the wound covered until healed. Take her antibiotics until they are gone. Call if not improving.

## 2011-01-18 NOTE — Progress Notes (Signed)
Subjective:   Abscess  Patient ID: Christian Maxwell, male   DOB: 11/30/1932, 77 y.o.   MRN: [id number]  HPI The patient is a 77 year old male with a history of colectomy by Dr. Orpah Greek in 2000 for colon cancer. He has had recurring problems with a wound abscess. In 2006 he had exploration of the lower end of his incision under local anesthesia by Dr. [name] for a possible stitch abscess. He however has had several recurrent abscesses in the same area since then. The last one was drained in the office about 1-1/2 years ago but we don't have those records today. He now presents with several days of pain and swelling at the lower end of his midline incision suprapubic. Just this afternoon it began draining bloody fluid. He has no generalized abdominal or GI complaints.  Past Medical History  Diagnosis Date  . Hypertension   . Hyperlipidemia   . Syncope and collapse   . Atrial fibrillation   . Chest pain   . Dyspnea   . PVC's (premature ventricular contractions)   . Abscess   . MRSA (methicillin resistant Staphylococcus aureus)    Past Surgical History  Procedure Date  . Lumbar laminectomy 1977  . Colon surgery 2000  . Wound exploration 2006    For possible stitch abscess Dr [name]   Current Outpatient Prescriptions  Medication Sig Dispense Refill  . aspirin 81 MG tablet Take 81 mg by mouth daily.        [name] atorvastatin (LIPITOR) 40 MG tablet Take 40 mg by mouth daily.        . calcium carbonate (OS-CAL - DOSED IN MG OF ELEMENTAL CALCIUM) 1250 MG tablet Take 1 tablet by mouth daily.        [name] CALCIUM PO Take by mouth daily.        . fish oil-omega-3 fatty acids 1000 MG capsule Take by mouth daily.        [name] FOLIC ACID PO Take by mouth daily.        . metoprolol (LOPRESSOR) 50 MG tablet Take 50 mg by mouth 2 (two) times daily.        . Thiamine HCl (VITAMIN B-1 PO) Take by mouth daily.        [name] warfarin (COUMADIN) 5 MG tablet Take 5 mg by mouth as directed.        . Zolpidem Tartrate  (AMBIEN PO) Take by mouth at bedtime.         No Known Allergies    Review of Systems  Constitutional: Negative for fever.  Gastrointestinal: Negative for vomiting, abdominal pain, diarrhea and constipation.       Objective:   Physical Exam General: Alert, well-developed, no acute distress Abdomen: Long healed midline incision. Generally soft and nontender. At the lower end of his incision in the suprapubic area is a partially draining fluctuant and indurated area with mild erythema measuring about 3-4 cm.     Assessment:    Recurrent wound abscess with history of colectomy in 2000. He had wound exploration and attempt to remove a stitch abscess in 2006 under local anesthesia. He has had recurrent abscesses in the same area. This may represent a stitch abscess. There is nothing clinically to suggest a fistula or intra-abdominal problem.     Plan:     Today under local anesthesia I unroofed the area. I vigorously explored the base of the wound which goes down to the fascia. I was unable to retrieve any  suture material or other foreign material. The wound was cleaned and packed. We will put him on antibiotics for 10 days. He will return in 4 weeks. We can discuss pros and cons of further wound exploration. If we decide to do this I would probably get a CT scan rule out any intra-abdominal component.

## 2011-01-24 ENCOUNTER — Ambulatory Visit (INDEPENDENT_AMBULATORY_CARE_PROVIDER_SITE_OTHER): Payer: Medicare Other | Admitting: Surgery

## 2011-01-24 ENCOUNTER — Encounter (INDEPENDENT_AMBULATORY_CARE_PROVIDER_SITE_OTHER): Payer: Self-pay | Admitting: Surgery

## 2011-01-24 ENCOUNTER — Telehealth: Payer: Self-pay | Admitting: *Deleted

## 2011-01-24 VITALS — BP 102/68 | HR 70 | Temp 96.2°F | Ht 70.0 in | Wt 197.2 lb

## 2011-01-24 DIAGNOSIS — L03319 Cellulitis of trunk, unspecified: Secondary | ICD-10-CM

## 2011-01-24 DIAGNOSIS — L02219 Cutaneous abscess of trunk, unspecified: Secondary | ICD-10-CM

## 2011-01-24 MED ORDER — DOXYCYCLINE HYCLATE 100 MG PO TABS: 100.0000 mg | ORAL_TABLET | Freq: Two times a day (BID) | ORAL | Status: AC

## 2011-01-24 NOTE — Patient Instructions (Addendum)
Try doxycycline antibiotic x 3 weeks to control infection.  SHAVE ALL PUBIC HAIRS OFF for the rest of your life  Consider surgery to remove remaining permanent stitch in incision.  MRSA Overview MRSA stands for methicillin-resistant staphylococcus aureus. It is a type of bacteria that is resistant to some common antibiotics and can cause infections in the skin and many other places in the body. Staphylococcus aureus, often called "staph," are bacteria that normally live on the skin or in the nose. Staph on the surface of the skin do not cause problems. However, if the staph enter the body through a cut or a wound or another break in the skin, an infection can happen.   Up until recently, problems with the MRSA type of staph have mainly been in healthcare settings. There are now more and more problems with MRSA infections in the community. Infections with MRSA are serious health problems that can cause death.  Most MRSA infections are acquired in one of two ways:  Healthcare-Associated MRSA (HA-MRSA):   Can be acquired by people in any healthcare setting. MRSA can be a big problem for the elderly, people with weakened immune systems, dialysis patients (people undergoing blood cleansing treatment because of kidney failure), and those who have had surgery.   Community-Associated MRSA (CA-MRSA):   Community spread of MRSA is becoming more common. It is known to spread among small children, in crowded settings or in situations where there is close skin-to-skin contact. MRSA can be spread through shared items, such as children's toys, razors, towels or sports equipment.  CAUSES All staph, including MRSA, are normally harmless unless they enter the body through a scratch, cut or another wound such as with surgery. All staph, including MRSA, can be spread from person to person by touching contaminated objects as well as through direct contact. SPECIAL GROUPS MRSA can present problems for special groups  of people. Some of these groups include:  Breastfeeding women.   The most common problem is CA-MRSA infection of the breast (mastitis). There is evidence that MRSA can be passed to an infant from infected breast milk. Your caregiver may recommend that you stop breastfeeding until the mastitis is under control.   If you are breastfeeding and have a MRSA infection in a place other than the breast, you may usually continue breastfeeding while under treatment. If taking antibiotics, ask your caregiver if it is safe to continue breastfeeding while taking your prescribed medicines.   Neonates (babies from birth to one month old) and infants (babies from one month to one year old). There is evidence that MRSA can be passed to an infant at birth if the mother has a CA-MRSA on the skin, in or around the birth canal, or an infection in the uterus, cervix, or vagina. CA-MRSA infection can look the same as a normal newborn or infant rash or several other skin infections. This can make it hard to diagnose MRSA.   Immune compromised individuals. If you have an immune system problem, you may have a higher chance of developing an MRSA infection.   Persons after any type of surgery. Staph in general, including MRSA, are the most common cause of infections that occur at the site of recent surgery.   Persons on long-term steroid medications. These kinds of medications can lower your resistance to infection. This can increase the chances of getting MRSA.  DIAGNOSIS  Diagnosis of MRSA is done by cultures of fluid samples that may come from:   Swabs taken  from cuts or wounds in infected areas.   Nasal swabs.   Saliva or deep cough specimens from the lungs (sputum).   Urine.   Blood.   Many people are "colonized" with MRSA but have no signs of infection. This means that people carry the MRSA germ on their skin or in their nose and do not have and may never develop MRSA infection.  TREATMENT Treatment varies  and is based on how serious, how deep or how extensive the infection process is. For example:  Some skin infections, such as a small boil or abscess, may be treated by draining pus from the site of the infection.   Deeper or more widespread soft tissue infections are usually treated with surgery to drain pus and antibiotics given by vein or by mouth. This may be recommended even if you are pregnant.   Serious infections may require a hospital stay.   If antibiotics are given, they may be needed for several weeks.  PREVENTION Because many people are colonized with staph, including MRSA, preventing spread of the bacteria from person to person is most important. The best way to prevent the spread of bacteria and other germs is through proper hand washing or by using alcohol-based hand disinfectants. The following are other ways to help prevent MRSA infection within the hospital and community settings.   Healthcare Settings:   In health care settings, strict hand washing or hand disinfection procedures should be followed before and after touching every patient.   Patients infected with MRSA are placed in isolation to prevent the spread of the bacteria.   Health care workers will routinely wear disposable gowns and gloves when touching or caring for patients infected with MRSA.   Hospital surfaces are disinfected frequently.   Community Settings:   Wash your hands frequently with soap and water for at least 15 seconds. Otherwise, use alcohol based hand disinfectants when soap and water is not available.   Do not share personal items. For example, avoid sharing razors, towels, clothing and athletic equipment.   Keep wounds covered. Pus from infected sores may contain MRSA and other bacteria. Keep cuts and abrasions clean and covered with sterile, dry bandages until they are healed.   If you have a wound that appears infected, ask your doctor if a culture for MRSA and other bacteria should be  done.   Take all antibiotic medication as prescribed by your caregiver. Even if you feel better, do not stop taking it.   If you are breastfeeding, talk to your caregiver about MRSA. You may be asked to temporarily stop breastfeeding.  HOME CARE INSTRUCTIONS  Take all medications as prescribed. Do not skip medications or stop them early just because you seem to be doing better.   For those with MRSA infections, avoid close contact with those around you as much as possible. Do not use towels, razors, toothbrushes, or bedding etc. that will be used by others.   To fight the infection, follow your caregiver's instructions for wound care.  SEEK IMMEDIATE MEDICAL CARE IF:  The infection appears to be getting worse, such as:   Increased warmth, redness or tenderness around the wound site.   A red line develops and extends from the infection site.   The area around the infection begins to turn dark in color.   Wound drainage is tan, yellow or green in appearance.   Foul smell.   You develop nausea and vomiting or cannot keep medicine down.   You  or your child has an unexplained oral temperature above 102.0 F (38.9 C). You or your child develop sweating, chills or body aches and pains.   Your baby is older than 3 months with a rectal temperature of 102 F (38.9 C) or higher.   Your baby is 34 months old or younger with a rectal temperature of 100.4 F (38 C) or higher.   You have difficulty breathing.  MAKE SURE YOU:   Understand these instructions.   Will watch your condition.   Will get help right away if you are not doing well or get worse.  Document Released: 05/01/2005 Document Re-Released: 07/26/2009 [facility] Patient Information 2011 [place], [place].Cellulitis Cellulitis is an infection of the skin and the tissue beneath it. The area is typically red and tender. It is caused by germs (bacteria) (usually staph or strep) that enter the body through cuts or sores. Cellulitis  most commonly occurs in the arms or lower legs.  HOME CARE INSTRUCTIONS  If you are given a prescription for medications which kill germs (antibiotics), take as directed until finished.   If the infection is on the arm or leg, keep the limb elevated as able.   Use a warm cloth several times per day to relieve pain and encourage healing.   See your caregiver for recheck of the infected site as scheduled or sooner if problems arise.   Only take over-the-counter or prescription medicines for pain, discomfort, or fever as directed by your caregiver.  SEEK MEDICAL CARE IF:  An oral temperature above 102F develops, not controlled by medication.   The area of redness (inflammation) is spreading, there are red streaks coming from the infected site, or if a part of the infection begins to turn dark in color.   The joint or bone underneath the infected skin becomes painful after the skin has healed.   The infection returns in the same or another area after it seems to have gone away.   A boil or bump swells up. This may be an abscess.   New, unexplained problems such as pain or fever develop.  SEEK IMMEDIATE MEDICAL CARE IF:  You or your child feels drowsy or lethargic.   There is vomiting, diarrhea, or lasting discomfort or feeling ill (malaise) with muscle aches and pains.  MAKE SURE YOU:   Understand these instructions.   Will watch your condition.   Will get help right away if you are not doing well or get worse.  Document Released: 02/08/2005 Document Re-Released: 02/26/2009 [facility] Patient Information 2011 [place], [place].

## 2011-01-24 NOTE — Progress Notes (Signed)
Subjective:     Patient ID: Christian Maxwell, male   DOB: [birth year], 77 y.o.   MRN: [id number]  HPI  Reason for visit: Followup on lower gum wound. History of recurrent stitch abscesses and abdominal wall abscesses.  The patient is a elderly male familiar to our group. He had an open colectomy in 2000 for a colon cancer stage II by Dr. Orpah Greek. His fascia was closed with Prolene. He had an open inguinal hernia repair with mesh and absorbable suture by Dr. Magnus Ivan in 2004.  He has had issues with subcutaneous abscesses/infections after these operations. These have been presumed to be due to stitch abscesses. He had it operatively explored in 2006 by Dr. [name]. He is come back several times the past 2 years to have them drained in the office. He's been placed on oral Bactrim for several months intermittently at a time.  Follwed by ID (Dr. [name]). He's been primarily followed by Dr. [name] but many other members of the group have seen him.  More recently, he had an inflamed area drained last week by Dr. Johna Sheriff. He thinks he was placed on Augmentin but cannot recall what exactly was put on. He comes today for followup.  He feels much better  No new lesions. The drainage has gone down. They are no longer able to pack it. Tenderness down.  Past Medical History  Diagnosis Date  . Hypertension   . Hyperlipidemia   . Syncope and collapse   . Atrial fibrillation   . Chest pain   . Dyspnea   . PVC's (premature ventricular contractions)   . Abscess   . MRSA (methicillin resistant Staphylococcus aureus)   . Cancer 2000    T3N0   Past Surgical History  Procedure Date  . Lumbar laminectomy 1977  . Wound exploration 2006    For possible stitch abscess Dr [name]  . Incise and drain abcess 2009,2010,2011,2012  . Hernia repair [phone]    DB  . Colon surgery 2000   Current outpatient prescriptions:aspirin 81 MG tablet, Take 81 mg by mouth daily.  , Disp: , Rfl: ;  atorvastatin (LIPITOR) 40  MG tablet, Take 40 mg by mouth daily.  , Disp: , Rfl: ;  calcium carbonate (OS-CAL - DOSED IN MG OF ELEMENTAL CALCIUM) 1250 MG tablet, Take 1 tablet by mouth daily.  , Disp: , Rfl: ;  CALCIUM PO, Take by mouth daily.  , Disp: , Rfl: ;  fish oil-omega-3 fatty acids 1000 MG capsule, Take by mouth daily.  , Disp: , Rfl:  FOLIC ACID PO, Take by mouth daily.  , Disp: , Rfl: ;  metoprolol (LOPRESSOR) 50 MG tablet, Take 50 mg by mouth 2 (two) times daily.  , Disp: , Rfl: ;  Thiamine HCl (VITAMIN B-1 PO), Take by mouth daily.  , Disp: , Rfl: ;  warfarin (COUMADIN) 5 MG tablet, Take 5 mg by mouth as directed.  , Disp: , Rfl: ;  Zolpidem Tartrate (AMBIEN PO), Take by mouth at bedtime.  , Disp: , Rfl:  doxycycline (VIBRA-TABS) 100 MG tablet, Take 1 tablet (100 mg total) by mouth 2 (two) times daily., Disp: 42 tablet, Rfl: 3  No Known Allergies   Review of Systems  Skin: Positive for wound. Negative for color change, pallor and rash.  All other systems reviewed and are negative.       Objective:   Physical Exam  Constitutional: He is oriented to person, place, and time. He appears well-developed and  well-nourished. No distress.  HENT:  Head: Normocephalic.  Mouth/Throat: Oropharynx is clear and moist. No oropharyngeal exudate.  Eyes: Conjunctivae and EOM are normal. Pupils are equal, round, and reactive to light. No scleral icterus.  Neck: Normal range of motion. Neck supple. No tracheal deviation present.  Cardiovascular: Normal rate, regular rhythm and intact distal pulses.   Pulmonary/Chest: Effort normal and breath sounds normal. No respiratory distress.  Abdominal: Soft. He exhibits no distension. There is no tenderness. Hernia confirmed negative in the right inguinal area and confirmed negative in the left inguinal area.       Suprapubic SQ Nodules x2 (both) 1X2cm.  Mobile, firm. Not fluctulent.  Central 2mm opening with sanguinopurulent scant drainage.  Proves ~74mm deep.   No hernia.  Numerous  pubic hairs - I shaved  Musculoskeletal: Normal range of motion. He exhibits no tenderness.  Lymphadenopathy:    He has no cervical adenopathy.       Right: No inguinal adenopathy present.       Left: No inguinal adenopathy present.  Neurological: He is alert and oriented to person, place, and time. No cranial nerve deficit. He exhibits normal muscle tone. Coordination normal.  Skin: Skin is warm and dry. No rash noted. He is not diaphoretic. No erythema. No pallor.  Psychiatric: He has a normal mood and affect. His behavior is normal. Judgment and thought content normal.       Assessment:     Recurrent subcutaneous infections suspicious for stitch abscesses. Improved with most recent incision and drainage last week, though not fully resolved.    Plan:     I placed him on doxycycline. 3 weeks minimum. Probable refills.  ? ID re-evaluation?  I agree with Dr. Johna Sheriff that it would be reasonable to consider a CAT scan to make sure there is no deep pockets that are causing persistent problems if this does not improve. I do not know if he benefit from an operative reexploration to remove those nodular areas & make sure no more prolene/stitch remain.  It does not seem to be in the right groin, so hopefully the mesh is not involved.  Return to clinic in 1 to 2 weeks to make sure this area improves.  Do not pack at this point since it seems to me be improved according to the patient. Continue to follow closely.

## 2011-01-24 NOTE — Telephone Encounter (Signed)
Pt walked in here to office be seen, was seen at [facility] and sent here because in afib and k+ 6.6. Pt walking without assist, no sob, appears stable no acute distress. Wants to be seen now because he has app in 20 min upstairs with a Careers adviser. Told him to go to his app and stop back here after and I will update him. I called [facility] and they repeated his k+ and it was then normal per Dr Marylouise Stacks staff/ pt unaware of new updated k+ level. They wanted him to be seen for afib/ app to be made. Pt will be updated when he returns.

## 2011-01-30 ENCOUNTER — Ambulatory Visit (INDEPENDENT_AMBULATORY_CARE_PROVIDER_SITE_OTHER): Payer: Medicare Other | Admitting: Cardiovascular Disease

## 2011-01-30 ENCOUNTER — Encounter: Payer: Self-pay | Admitting: Cardiovascular Disease

## 2011-01-30 VITALS — BP 110/64 | HR 76 | Ht 70.0 in | Wt 198.8 lb

## 2011-01-30 DIAGNOSIS — I4891 Unspecified atrial fibrillation: Secondary | ICD-10-CM

## 2011-01-30 NOTE — Progress Notes (Signed)
Christian Maxwell Date of Birth  June 15, 1932 [facility] [address]    [address] [place], [place]  [address] [phone]  Fax  [phone]  History of Present Illness:  Christian Maxwell is a 77 year old gentleman with a history of atrial fibrillation, hyperlipidemia, and a history of PVCs. He exercises on a regular basis. He has not had any episodes of chest pain or shortness of breath. He denies any syncope or presyncope.  He was having some episodes of dizziness but these seemed to have resolved.  Current Outpatient Prescriptions on File Prior to Visit  Medication Sig Dispense Refill  . aspirin 81 MG tablet Take 81 mg by mouth daily.        [name] atorvastatin (LIPITOR) 40 MG tablet Take 40 mg by mouth daily.        [name] CALCIUM PO Take by mouth daily.        [name] doxycycline (VIBRA-TABS) 100 MG tablet Take 1 tablet (100 mg total) by mouth 2 (two) times daily.  42 tablet  3  . fish oil-omega-3 fatty acids 1000 MG capsule Take by mouth daily.        [name] FOLIC ACID PO Take by mouth daily.        . metoprolol (LOPRESSOR) 50 MG tablet Take 50 mg by mouth 2 (two) times daily.        . Thiamine HCl (VITAMIN B-1 PO) Take by mouth daily.        [name] warfarin (COUMADIN) 5 MG tablet Take 5 mg by mouth as directed.        . Zolpidem Tartrate (AMBIEN PO) Take by mouth at bedtime.          No Known Allergies  Past Medical History  Diagnosis Date  . Hypertension   . Hyperlipidemia   . Syncope and collapse   . Atrial fibrillation   . Chest pain   . Dyspnea   . PVC's (premature ventricular contractions)   . Abscess   . MRSA (methicillin resistant Staphylococcus aureus)   . Cancer 2000    T3N0    Past Surgical History  Procedure Date  . Lumbar laminectomy 1977  . Wound exploration 2006    For possible stitch abscess Dr [name]  . Incise and drain abcess 2009,2010,2011,2012  . Hernia repair [phone]    DB  . Colon surgery 2000    History  Smoking status  . Former Smoker  . Quit date:  10/13/1977  Smokeless tobacco  . Not on file    History  Alcohol Use No    Family History  Problem Relation Age of Onset  . Stroke Mother   . Stroke Father   . Hypertension Father   . Heart attack Father   . Stroke Sister   . Stroke Brother   . Stroke Brother     Reviw of Systems:  Reviewed in the HPI.  All other systems are negative.  Physical Exam: BP 110/64  Pulse 76  Ht 5\' 10"  (1.778 m)  Wt 198 lb 12.8 oz (90.175 kg)  BMI 28.52 kg/m2 The patient is alert and oriented x 3.  The mood and affect are normal.   Skin: warm and dry.  Color is normal.    HEENT:   the sclera are nonicteric.  The mucous membranes are moist.  The carotids are 2+ without bruits.  There is no thyromegaly.  There is no JVD.    Lungs: clear.  The chest wall is non tender.  Heart: irregular rate with a normal S1 and S2.  There are no murmurs, gallops, or rubs. The PMI is not displaced.     Abdomen: good bowel sounds.  There is no guarding or rebound.  There is no hepatosplenomegaly or tenderness.  There are no masses.   Extremities:  no clubbing, cyanosis, or edema.  The legs are without rashes.  The distal pulses are intact.   Neuro:  Cranial nerves II - XII are intact.  Motor and sensory functions are intact.    The gait is normal.  ECG:  Assessment / Plan:

## 2011-01-30 NOTE — Assessment & Plan Note (Signed)
His heart rate is well controlled. He remains in atrial fibrillation.  He is followed in our Coumadin clinic and his INR levels have been well controlled.

## 2011-02-01 ENCOUNTER — Encounter (INDEPENDENT_AMBULATORY_CARE_PROVIDER_SITE_OTHER): Payer: Self-pay | Admitting: Surgery

## 2011-02-01 ENCOUNTER — Ambulatory Visit (INDEPENDENT_AMBULATORY_CARE_PROVIDER_SITE_OTHER): Payer: Medicare Other | Admitting: Surgery

## 2011-02-01 VITALS — BP 112/70 | HR 70 | Temp 97.1°F | Resp 16 | Ht 70.0 in | Wt 197.0 lb

## 2011-02-01 DIAGNOSIS — L03319 Cellulitis of trunk, unspecified: Secondary | ICD-10-CM

## 2011-02-01 DIAGNOSIS — L02219 Cutaneous abscess of trunk, unspecified: Secondary | ICD-10-CM

## 2011-02-01 NOTE — Patient Instructions (Signed)
Complete antibiotics (last day 02 October).  Keep pubic hair trimmed to prevent new or continuing infections  Consider follow-up to see if further surgery or studies needed to prevent further infections.

## 2011-02-01 NOTE — Progress Notes (Signed)
Subjective:     Patient ID: Christian Maxwell, male   DOB: Mar 17, 1933, 77 y.o.   MRN: [id number]  HPI  Procedure:  I&D abscess suprapubic abd wall 01/18/2011 by Dr. Johna Sheriff.  Reason for visit: Followup on lower abdominal wound. History of recurrent stitch abscesses and abdominal wall abscesses.  The patient is a elderly male familiar to our group. He had an open colectomy in 2000 for a colon cancer stage II by Dr. Orpah Greek. His fascia was closed with running Prolene. He had an open inguinal hernia repair with mesh and absorbable suture by Dr. Magnus Ivan in 2004.  He has had issues with subcutaneous abscesses/infections after these operations. These have been presumed to be due to stitch abscesses. He had it operatively explored in 2006 by Dr. [name]. He is come back several times the past 2 years to have infections drained in the office. He's been placed on oral Bactrim for several months intermittently at a time.  Follwed by ID (Dr. [name]). He's been primarily followed by Dr. [name] but many other members of the group have seen him.  More recently, he had an inflamed area drained early Sept 2012 by Dr. Johna Sheriff. The patient is in the middle of a doxycycline course until early Oct.  He feels much better.  No new lesions. The drainage has stopped.  Tenderness down.  Past Medical History  Diagnosis Date  . Hypertension   . Hyperlipidemia   . Syncope and collapse   . Atrial fibrillation   . Chest pain   . Dyspnea   . PVC's (premature ventricular contractions)   . Abscess   . MRSA (methicillin resistant Staphylococcus aureus)   . Cancer 2000    T3N0   Past Surgical History  Procedure Date  . Lumbar laminectomy 1977  . Wound exploration 2006    For possible stitch abscess Dr [name]  . Incise and drain abcess 2009,2010,2011,2012  . Hernia repair [phone]    DB  . Colon surgery 2000   Current outpatient prescriptions:aspirin 81 MG tablet, Take 81 mg by mouth daily.  , Disp: , Rfl: ;   atorvastatin (LIPITOR) 40 MG tablet, Take 40 mg by mouth daily.  , Disp: , Rfl: ;  CALCIUM PO, Take by mouth daily.  , Disp: , Rfl: ;  doxycycline (VIBRA-TABS) 100 MG tablet, Take 1 tablet (100 mg total) by mouth 2 (two) times daily., Disp: 42 tablet, Rfl: 3;  fish oil-omega-3 fatty acids 1000 MG capsule, Take by mouth daily.  , Disp: , Rfl:  FOLIC ACID PO, Take by mouth daily.  , Disp: , Rfl: ;  metoprolol (LOPRESSOR) 50 MG tablet, Take 50 mg by mouth 2 (two) times daily.  , Disp: , Rfl: ;  SIMVASTATIN PO, Take by mouth.  , Disp: , Rfl: ;  Thiamine HCl (VITAMIN B-1 PO), Take by mouth daily.  , Disp: , Rfl: ;  warfarin (COUMADIN) 5 MG tablet, Take 5 mg by mouth as directed.  , Disp: , Rfl: ;  Zolpidem Tartrate (AMBIEN PO), Take by mouth at bedtime.  , Disp: , Rfl:   No Known Allergies   Review of Systems  Skin: Positive for wound. Negative for color change, pallor and rash.  All other systems reviewed and are negative.       Objective:   Physical Exam  Constitutional: He is oriented to person, place, and time. He appears well-developed and well-nourished. No distress.  HENT:  Head: Normocephalic.  Mouth/Throat: Oropharynx is clear and  moist. No oropharyngeal exudate.  Eyes: Conjunctivae and EOM are normal. Pupils are equal, round, and reactive to light. No scleral icterus.  Neck: Normal range of motion. Neck supple. No tracheal deviation present.  Cardiovascular: Normal rate, regular rhythm and intact distal pulses.   Pulmonary/Chest: Effort normal and breath sounds normal. No respiratory distress.  Abdominal: Soft. He exhibits no distension. There is no tenderness. Hernia confirmed negative in the right inguinal area and confirmed negative in the left inguinal area.       Suprapubic SQ Nodules x2 (both) 1X2cm.  Mobile, firm. Not fluctulent.  Wound closed.  No fluctuance.  Mild erythema 5mm.  Improved.  No hernia.  Surrounding pubic hairs remain trimmed.  Musculoskeletal: Normal range of  motion. He exhibits no tenderness.  Lymphadenopathy:    He has no cervical adenopathy.       Right: No inguinal adenopathy present.       Left: No inguinal adenopathy present.  Neurological: He is alert and oriented to person, place, and time. No cranial nerve deficit. He exhibits normal muscle tone. Coordination normal.  Skin: Skin is warm and dry. No rash noted. He is not diaphoretic. No erythema. No pallor.  Psychiatric: He has a normal mood and affect. His behavior is normal. Judgment and thought content normal.       Assessment:     Recurrent subcutaneous infections suspicious for stitch abscesses. Improved with most recent incision and drainage 2 weeks ago, though not fully resolved.    Plan:     Complete doxycycline x3 weeks minimum. Probable refills.  ? ID re-evaluation?  I agree with Dr. Johna Sheriff that it would be reasonable to consider a CAT scan to make sure there is no deep pockets that are causing persistent problems if this does not improve. I do not know if he benefit from an operative reexploration to remove those nodular areas & make sure no more prolene/stitch remain.  It does not seem to be in the right groin, so hopefully the mesh is not involved.  Return to clinic in 2-3 weeks to make sure this area improves.  He prefers Dr. [name] if possible.  See if more workup ?CT or surgery needed.  Do not pack at this point since it is improved.  Keep pubic hairs trimmed to avoid folliculitis / recurrent infections

## 2011-02-02 ENCOUNTER — Telehealth: Payer: Self-pay | Admitting: Cardiovascular Disease

## 2011-02-02 NOTE — Telephone Encounter (Signed)
Had duplicate orders on AV sheet, explained after reviewing that it was placed in computer twice,

## 2011-02-02 NOTE — Telephone Encounter (Signed)
Pt stating that paper that was given to him when he left. Pt wife would like to talk to someone about it. Please call [phone] (cell) if pt doesn't answer home number.

## 2011-02-03 ENCOUNTER — Ambulatory Visit (INDEPENDENT_AMBULATORY_CARE_PROVIDER_SITE_OTHER): Payer: Medicare Other | Admitting: *Deleted

## 2011-02-03 DIAGNOSIS — I4891 Unspecified atrial fibrillation: Secondary | ICD-10-CM

## 2011-02-03 DIAGNOSIS — Z7901 Long term (current) use of anticoagulants: Secondary | ICD-10-CM

## 2011-02-03 LAB — POCT INR: INR: 2.3

## 2011-02-08 ENCOUNTER — Ambulatory Visit (INDEPENDENT_AMBULATORY_CARE_PROVIDER_SITE_OTHER): Payer: Medicare Other | Admitting: General Surgery

## 2011-02-08 ENCOUNTER — Other Ambulatory Visit (INDEPENDENT_AMBULATORY_CARE_PROVIDER_SITE_OTHER): Payer: Self-pay | Admitting: General Surgery

## 2011-02-08 ENCOUNTER — Encounter (INDEPENDENT_AMBULATORY_CARE_PROVIDER_SITE_OTHER): Payer: Self-pay | Admitting: General Surgery

## 2011-02-08 VITALS — BP 100/82 | HR 66 | Temp 97.3°F | Resp 18 | Ht 70.0 in | Wt 197.5 lb

## 2011-02-08 DIAGNOSIS — L03319 Cellulitis of trunk, unspecified: Secondary | ICD-10-CM

## 2011-02-08 DIAGNOSIS — L02211 Cutaneous abscess of abdominal wall: Secondary | ICD-10-CM

## 2011-02-08 NOTE — Patient Instructions (Signed)
Keep wound covered. Continue Doxycycline

## 2011-02-08 NOTE — Progress Notes (Signed)
Subjective:     Patient ID: Christian Maxwell, male   DOB: 07/07/1932, 77 y.o.   MRN: [id number]  HPI  Patient presents for followup of abdominal abscess. This has recently incised and drained. He is currently taking doxycycline. He has a history of stitch abscesses of his abdominal wall. He has also had localized abdominal wall abscess with MRSA in the past. He is still having a little bit of drainage. Most of the pain is improved. Review of Systems     Objective:   Physical Exam  Eyes: Conjunctivae are normal. Pupils are equal, round, and reactive to light.  Pulmonary/Chest: Effort normal. No respiratory distress. He has no wheezes.  Abdominal: Soft. He exhibits no distension. There is no tenderness. There is no rebound.  Low on the midline of the abdominal wall is a small I. and D. site. There is minimal serosanguineous drainage. There is no residual fluctuance. There is no erythema. There is minimal tenderness. The fluid was sent for culture.     Assessment:     Abdominal wall abscess    Plan:     Cultures were sent today. He'll continue his doxycycline. If this area does not stop draining we may need to proceed with excising the surrounding tissue in the operating room. He has undergone this previously. See him back in 2 weeks. Questions were answered.

## 2011-02-11 LAB — WOUND CULTURE: Gram Stain: NONE SEEN

## 2011-02-17 ENCOUNTER — Encounter (INDEPENDENT_AMBULATORY_CARE_PROVIDER_SITE_OTHER): Payer: Medicare Other | Admitting: General Surgery

## 2011-02-22 ENCOUNTER — Encounter (INDEPENDENT_AMBULATORY_CARE_PROVIDER_SITE_OTHER): Payer: Self-pay | Admitting: General Surgery

## 2011-02-22 ENCOUNTER — Ambulatory Visit (INDEPENDENT_AMBULATORY_CARE_PROVIDER_SITE_OTHER): Payer: Medicare Other | Admitting: General Surgery

## 2011-02-22 VITALS — BP 106/62 | HR 60 | Temp 96.9°F | Resp 16 | Ht 70.0 in | Wt 196.6 lb

## 2011-02-22 DIAGNOSIS — L02211 Cutaneous abscess of abdominal wall: Secondary | ICD-10-CM

## 2011-02-22 DIAGNOSIS — L02219 Cutaneous abscess of trunk, unspecified: Secondary | ICD-10-CM

## 2011-02-22 NOTE — Progress Notes (Signed)
Subjective:     Patient ID: Christian Maxwell, male   DOB: 1932/12/30, 77 y.o.   MRN: [id number]  HPI Patient presented for followup of lower abdominal wall abscess. This is on the midline at the bottom of his previous laparotomy scar. He has a history of chronic recurrent stitch abscesses, at times these are also infected with MRSA. He is currently off antibiotics. He continues to have draining from the small sinus.  Review of Systems     Objective:   Physical Exam  Constitutional: He appears well-developed and well-nourished.  HENT:  Head: Normocephalic and atraumatic.  Neck: Normal range of motion. Neck supple.  Pulmonary/Chest: Effort normal and breath sounds normal.  Abdominal: Soft. He exhibits no distension. There is no tenderness.  There is a 1 cm there is a small amount of cloudy drainage. The does not seem to track with the tip ofA silver nitrate stick. This has not changed significantly from his last visit. Cardiac: Irregular irregular the patient.     Assessment:     Chronic stitch abscess lower abdominal wall.    Plan:     I feel this is non-improving. We need to excise it surgically. Procedures and benefits were discussed in detail with the patient. He will need to hold his Coumadin and aspirin for 5 days. We will obtain clearance for that from Dr. Delane Ginger  his cardiologist. We will then plan to schedule in the near future. Questions were answered.

## 2011-02-22 NOTE — Patient Instructions (Signed)
Scheduling will call you after we receive cardiac clearance

## 2011-03-01 ENCOUNTER — Ambulatory Visit (INDEPENDENT_AMBULATORY_CARE_PROVIDER_SITE_OTHER): Payer: Medicare Other | Admitting: General Surgery

## 2011-03-01 ENCOUNTER — Encounter (INDEPENDENT_AMBULATORY_CARE_PROVIDER_SITE_OTHER): Payer: Self-pay | Admitting: General Surgery

## 2011-03-01 VITALS — BP 106/68 | HR 60 | Temp 96.5°F | Resp 20 | Ht 70.0 in | Wt 197.0 lb

## 2011-03-01 DIAGNOSIS — L923 Foreign body granuloma of the skin and subcutaneous tissue: Secondary | ICD-10-CM

## 2011-03-01 NOTE — Progress Notes (Signed)
Subjective:     Patient ID: Christian Maxwell, male   DOB: [birth year], 77 y.o.   MRN: [id number]  HPI This patient is known to our practice for a prior colectomy for colon cancer approximately 10 years ago. He has done well from his cancer standpoint, however he has continual drainage from a lower midline sinus. He has some purulent drainage draining from a small area at the lower abdomen. He has some discomfort in areas well and has been treated in the past for MRSA. He is taking antibiotics twice daily. He denies any fevers or chills. He is scheduled for surgery in approximately 2 weeks with Dr. [name].  Review of Systems     Objective:   Physical Exam No distress and nontoxic-appearing  Abdomen soft and nontender on exam his midline incision is well-healed but at the lower midline he has a small 1-2 mm sinus tract with some purulent drainage but no surrounding cellulitis or significant tenderness. He has some redness in the outline of his Band-Aid but this does not appear to be cellulitis but reactive to his Band-Aid.    Assessment:     Abdominal wall sinus and suture abscess-no evidence of acute infection    Plan:     This is chronically infected and likely a residual suture abscess or granuloma due to foreign body. He is rescheduled for wound exploration with Dr. [name] in 2 weeks and I agree that this is what is necessary for treatment of this. There is no evidence of acute infection or need for emergent incision and drainage.

## 2011-03-03 ENCOUNTER — Encounter: Payer: Medicare Other | Admitting: *Deleted

## 2011-03-16 ENCOUNTER — Ambulatory Visit (HOSPITAL_BASED_OUTPATIENT_CLINIC_OR_DEPARTMENT_OTHER): Admission: RE | Admit: 2011-03-16 | Payer: Medicare Other | Source: Ambulatory Visit | Admitting: General Surgery

## 2011-03-22 ENCOUNTER — Ambulatory Visit (INDEPENDENT_AMBULATORY_CARE_PROVIDER_SITE_OTHER): Payer: Medicare Other | Admitting: General Surgery

## 2011-03-22 ENCOUNTER — Encounter (INDEPENDENT_AMBULATORY_CARE_PROVIDER_SITE_OTHER): Payer: Self-pay | Admitting: General Surgery

## 2011-03-22 VITALS — BP 122/80 | HR 72 | Wt 196.0 lb

## 2011-03-22 DIAGNOSIS — L03319 Cellulitis of trunk, unspecified: Secondary | ICD-10-CM

## 2011-03-22 DIAGNOSIS — L02219 Cutaneous abscess of trunk, unspecified: Secondary | ICD-10-CM

## 2011-03-22 DIAGNOSIS — L02211 Cutaneous abscess of abdominal wall: Secondary | ICD-10-CM

## 2011-03-22 NOTE — Progress Notes (Signed)
Subjective:     Patient ID: Christian Maxwell, male   DOB: Sep 08, 1932, 77 y.o.   MRN: [id number]  HPI  Cerclage presents for followup of draining stitch abscess of the abdominal wall. He was scheduled for excision of this area earlier this week. He called and canceled that surgery saying that the area was much improved. He does wear a bandage at night but has not noted much drainage. Review of Systems     Objective:   Physical Exam  Neck: Normal range of motion. Neck supple.  Cardiovascular: Normal rate and regular rhythm.   Pulmonary/Chest: Effort normal and breath sounds normal. No respiratory distress. He has no wheezes.  Abdominal: Soft. Bowel sounds are normal. He exhibits no distension. There is no tenderness.  Lower midline has a small bit of granulation tissue. There is less fullness and less induration in the area. There is minimal pink discharge expressible. There is no evidence of cellulitis or ongoing infection.     Assessment:     Improvement in her lower midline abdominal wall drainage. This is likely a chronic stitch abscess.    Plan:       The patient is reluctant to have the area excised. After great to observe things and see him back in followup. At the end of the exam he shiowed  me an area on his right knee where he fell and suffered an abrasion. There is no evidence of infection. There is a 2 cm abrasion. This was dressed with antibiotic ointment and a bandage. We'll see him back in 8 weeks.

## 2011-04-21 ENCOUNTER — Encounter: Payer: Self-pay | Admitting: Cardiovascular Disease

## 2011-04-21 ENCOUNTER — Ambulatory Visit (INDEPENDENT_AMBULATORY_CARE_PROVIDER_SITE_OTHER): Payer: Medicare Other | Admitting: Cardiovascular Disease

## 2011-04-21 ENCOUNTER — Telehealth (INDEPENDENT_AMBULATORY_CARE_PROVIDER_SITE_OTHER): Payer: Self-pay

## 2011-04-21 DIAGNOSIS — I951 Orthostatic hypotension: Secondary | ICD-10-CM

## 2011-04-21 DIAGNOSIS — R079 Chest pain, unspecified: Secondary | ICD-10-CM

## 2011-04-21 DIAGNOSIS — I959 Hypotension, unspecified: Secondary | ICD-10-CM

## 2011-04-21 DIAGNOSIS — R42 Dizziness and giddiness: Secondary | ICD-10-CM | POA: Insufficient documentation

## 2011-04-21 DIAGNOSIS — R0602 Shortness of breath: Secondary | ICD-10-CM

## 2011-04-21 DIAGNOSIS — R0789 Other chest pain: Secondary | ICD-10-CM | POA: Insufficient documentation

## 2011-04-21 MED ORDER — METOPROLOL TARTRATE 25 MG PO TABS: 25.0000 mg | ORAL_TABLET | Freq: Two times a day (BID) | ORAL | Status: DC

## 2011-04-21 NOTE — Telephone Encounter (Signed)
Offered reassurance to [name] r/t lower abdomen . Walk-in pt, no scheduled appointment, requested to be seen by RN. Examination of lower abdomen unremarkable, no redness , no fever , no c/o pain or discomfort. Pt . Has appointment with Dr. [name] in less than 3 weeks.

## 2011-04-21 NOTE — Patient Instructions (Signed)
Your physician recommends that you schedule a follow-up appointment in: 3 months  Your physician recommends that you return for lab work in: 3 months BMP  Your physician has recommended you make the following change in your medication:   DECREASE: metoprolol to 25 mg one tablet 2 times a day 12 hours apart

## 2011-04-21 NOTE — Assessment & Plan Note (Signed)
He's having some dizziness which is probably related to his low blood pressure and bradycardia. We'll decrease his metoprolol to 25 mg twice a day. We'll followup with him in several months.

## 2011-04-21 NOTE — Assessment & Plan Note (Signed)
Christian Maxwell presents with some vague episodes of chest pain and chest discomfort. He has a long history of atrial flutter ablation. For further evaluation, I like to schedule him for a [employer] study. We'll also get an echocardiogram. I'll see him back in the office in several months for a followup office visit.

## 2011-04-21 NOTE — Progress Notes (Addendum)
Christian Maxwell Date of Birth  Apr 23, 1933 [facility] [address]    [address] [place], [place]  [address] [phone]  Fax  [phone]  History of Present Illness:  Christian Maxwell is a 77 year old gentleman with a history of atrial fibrillation, hyperlipidemia, and a history of PVCs. He exercises on a regular basis. He has not had any episodes of chest pain or shortness of breath. He denies any syncope or presyncope.  He was having some episodes of dizziness but these seemed to have resolved.  He's also been having some shortness of breath particularly when he bends over. He's not had any exertional shortness breath.     Current Outpatient Prescriptions on File Prior to Visit  Medication Sig Dispense Refill  . aspirin 81 MG tablet Take 81 mg by mouth daily.        [name] atorvastatin (LIPITOR) 40 MG tablet Take 40 mg by mouth daily.        [name] CALCIUM PO Take by mouth daily.        . fish oil-omega-3 fatty acids 1000 MG capsule Take by mouth daily.        [name] FOLIC ACID PO Take by mouth daily.        . metoprolol (LOPRESSOR) 50 MG tablet Take 50 mg by mouth 2 (two) times daily.        [name] SIMVASTATIN PO Take by mouth.        . Thiamine HCl (VITAMIN B-1 PO) Take by mouth daily.        [name] warfarin (COUMADIN) 5 MG tablet Take 5 mg by mouth as directed.        . Zolpidem Tartrate (AMBIEN PO) Take by mouth at bedtime.          No Known Allergies  Past Medical History  Diagnosis Date  . Hypertension   . Hyperlipidemia   . Syncope and collapse   . Atrial fibrillation   . Chest pain   . Dyspnea   . PVC's (premature ventricular contractions)   . Abscess   . MRSA (methicillin resistant Staphylococcus aureus)   . Cancer 2000    T3N0    Past Surgical History  Procedure Date  . Lumbar laminectomy 1977  . Wound exploration 2006    For possible stitch abscess Dr [name]  . Incise and drain abcess 2009,2010,2011,2012  . Hernia repair [phone]    DB  . Colon surgery 2000    History    Smoking status  . Former Smoker  . Quit date: 10/13/1977  Smokeless tobacco  . Not on file    History  Alcohol Use No    Family History  Problem Relation Age of Onset  . Stroke Mother   . Stroke Father   . Hypertension Father   . Heart attack Father   . Stroke Sister   . Stroke Brother   . Stroke Brother     Reviw of Systems:  Reviewed in the HPI.  All other systems are negative.  Physical Exam: BP 90/54  Pulse 94  Resp 18  Ht 5\' 10"  (1.778 m)  Wt 197 lb (89.359 kg)  BMI 28.27 kg/m2 The patient is alert and oriented x 3.  The mood and affect are normal.   Skin: warm and dry.  Color is normal.    HEENT:   the sclera are nonicteric.  The mucous membranes are moist.  The carotids are 2+ without bruits.  There is no thyromegaly.  There is  no JVD.    Lungs: clear.  The chest wall is non tender.    Heart: irregular rate with a normal S1 and S2.  There are no murmurs, gallops, or rubs. The PMI is not displaced.     Abdomen: good bowel sounds.  There is no guarding or rebound.  There is no hepatosplenomegaly or tenderness.  There are no masses.   Extremities:  no clubbing, cyanosis, or edema.  The legs are without rashes.  The distal pulses are intact.   Neuro:  Cranial nerves II - XII are intact.  Motor and sensory functions are intact.    The gait is normal.  ECG: Atrial fibrillation with a slow ventricular response.  Assessment / Plan:

## 2011-05-17 ENCOUNTER — Encounter (INDEPENDENT_AMBULATORY_CARE_PROVIDER_SITE_OTHER): Payer: Medicare Other | Admitting: General Surgery

## 2011-06-08 ENCOUNTER — Ambulatory Visit (INDEPENDENT_AMBULATORY_CARE_PROVIDER_SITE_OTHER): Payer: Medicare Other | Admitting: Cardiovascular Disease

## 2011-06-08 ENCOUNTER — Encounter: Payer: Self-pay | Admitting: Cardiovascular Disease

## 2011-06-08 VITALS — BP 103/72 | HR 51 | Ht 70.0 in | Wt 195.8 lb

## 2011-06-08 DIAGNOSIS — I4891 Unspecified atrial fibrillation: Secondary | ICD-10-CM

## 2011-06-08 DIAGNOSIS — R0600 Dyspnea, unspecified: Secondary | ICD-10-CM

## 2011-06-08 DIAGNOSIS — E785 Hyperlipidemia, unspecified: Secondary | ICD-10-CM

## 2011-06-08 DIAGNOSIS — R0609 Other forms of dyspnea: Secondary | ICD-10-CM

## 2011-06-08 NOTE — Progress Notes (Signed)
Christian Maxwell Date of Birth  1932-08-08 [name]     [place]  [address]    [address]   [address] [place], [place]  [address]    [place], [place]  [address] [phone]  Fax  [phone]  [phone]  Fax [phone]   History of Present Illness:  Problem list: 1. Atrial fibrillation 2. Hyperlipidemia 3. Premature ventricular contractions  Christian Maxwell is a 78 year old gentleman with a history of atrial fibrillation, hyperlipidemia, and a history of PVCs. He exercises on a regular basis. He has not had any episodes of chest pain or shortness of breath. He denies any syncope or presyncope.   he presents today for shortness of breath. He short of breath at rest and also with exertion. He denies any cough or sputum production. He denies any upper respiratory illness.  Current Outpatient Prescriptions on File Prior to Visit  Medication Sig Dispense Refill  . aspirin 81 MG tablet Take 81 mg by mouth daily.        [name] atorvastatin (LIPITOR) 40 MG tablet Take 40 mg by mouth daily.        [name] CALCIUM PO Take by mouth daily.        . fish oil-omega-3 fatty acids 1000 MG capsule Take by mouth daily.        [name] FOLIC ACID PO Take by mouth daily.        . metoprolol (LOPRESSOR) 25 MG tablet Take 1 tablet (25 mg total) by mouth 2 (two) times daily.  60 tablet  5  . SIMVASTATIN PO Take by mouth.        . Thiamine HCl (VITAMIN B-1 PO) Take by mouth daily.        [name] warfarin (COUMADIN) 5 MG tablet Take 5 mg by mouth as directed.        . Zolpidem Tartrate (AMBIEN PO) Take by mouth at bedtime.          No Known Allergies  Past Medical History  Diagnosis Date  . Hypertension   . Hyperlipidemia   . Syncope and collapse   . Atrial fibrillation   . Chest pain   . Dyspnea   . PVC's (premature ventricular contractions)   . Abscess   . MRSA (methicillin resistant Staphylococcus aureus)   . Cancer 2000    T3N0    Past Surgical History  Procedure Date  . Lumbar  laminectomy 1977  . Wound exploration 2006    For possible stitch abscess Dr [name]  . Incise and drain abcess 2009,2010,2011,2012  . Hernia repair [phone]    DB  . Colon surgery 2000    History  Smoking status  . Former Smoker  . Quit date: 10/13/1977  Smokeless tobacco  . Not on file    History  Alcohol Use No    Family History  Problem Relation Age of Onset  . Stroke Mother   . Stroke Father   . Hypertension Father   . Heart attack Father   . Stroke Sister   . Stroke Brother   . Stroke Brother     Reviw of Systems:  Reviewed in the HPI.  All other systems are negative.  Physical Exam: BP 103/72  Pulse 51  Ht 5\' 10"  (1.778 m)  Wt 195 lb 12.8 oz (88.814 kg)  BMI 28.09 kg/m2  SpO2 96% The patient is alert and oriented x 3.  The mood and affect are normal.   Skin: warm  and dry.  Color is normal.    HEENT:   Normocephalic/atraumatic. No JVD. His carotids are normal.  Lungs: Lungs are clear.   Heart: Irregularly irregular. He has no murmurs.    Abdomen: Is good bowel sounds. There is no hepatomegaly  Extremities:  No clubbing cyanosis or edema.  Neuro:  Exam is nonfocal. His gait is normal.    ECG:  His oxygen saturation was 96 at rest. The nurse ambulated him for about a minute or 2. He walks 200 feet. His O2 saturation stayed at 95%. His heart rate went up to 100.  Assessment / Plan:

## 2011-06-08 NOTE — Progress Notes (Signed)
Ambulated 100 ft on room air, spo2 range 92-97, greatest hr 114 down to 70s

## 2011-06-08 NOTE — Assessment & Plan Note (Signed)
He has stable atrial fibrillation. He is seen in our Coumadin clinic.

## 2011-06-08 NOTE — Patient Instructions (Signed)
Your physician recommends that you schedule a follow-up appointment in: 3 months  Your physician has requested that you have an echocardiogram. Echocardiography is a painless test that uses sound waves to create images of your heart. It provides your doctor with information about the size and shape of your heart and how well your heart's chambers and valves are working. This procedure takes approximately one hour. There are no restrictions for this procedure.  Your physician recommends that you return for a FASTING lipid profile: 3 months 

## 2011-06-08 NOTE — Assessment & Plan Note (Signed)
Christian Maxwell has been complaining of some shortness of breath. He has these episodes both with rest and exertion. His O2 saturation at rest was 96%. He ambulated for 200 feet. His heart rate got up to 100. His O2 sat stayed between 94- 96%  We'll have him get an echocardiogram. We'll see him back in several months.

## 2011-06-15 ENCOUNTER — Ambulatory Visit (INDEPENDENT_AMBULATORY_CARE_PROVIDER_SITE_OTHER): Payer: Medicare Other | Admitting: Nurse Practitioner

## 2011-06-15 ENCOUNTER — Encounter: Payer: Self-pay | Admitting: Nurse Practitioner

## 2011-06-15 VITALS — BP 126/84 | HR 80 | Ht 70.0 in | Wt 196.0 lb

## 2011-06-15 DIAGNOSIS — R0989 Other specified symptoms and signs involving the circulatory and respiratory systems: Secondary | ICD-10-CM | POA: Diagnosis not present

## 2011-06-15 DIAGNOSIS — R0609 Other forms of dyspnea: Secondary | ICD-10-CM | POA: Diagnosis not present

## 2011-06-15 DIAGNOSIS — I4891 Unspecified atrial fibrillation: Secondary | ICD-10-CM

## 2011-06-15 DIAGNOSIS — R0600 Dyspnea, unspecified: Secondary | ICD-10-CM

## 2011-06-15 NOTE — Assessment & Plan Note (Signed)
He seems ok from our standpoint. I have asked him to keep his echo appointment next week. We will see him back as scheduled in April. Patient is agreeable to this plan and will call if any problems develop in the interim.

## 2011-06-15 NOTE — Patient Instructions (Signed)
Lets get your ultrasound as planned of your heart.   We will see you back at your regular time.  Call the [facility] office at [phone] if you have any questions, problems or concerns.

## 2011-06-15 NOTE — Progress Notes (Signed)
Christian Maxwell Date of Birth: October 23, 1932 Medical Record #[id number]  History of Present Illness: Christian Maxwell is seen today for a work in visit. He is seen for Dr. Elease Hashimoto. He is a 78 year old male with atrial fib, hyperlipidemia and PVC's. He was here about one week ago with the complaint of shortness of breath. His oxygen sats were satisfactory with ambulation in the office. Heart rate increased to about 100. He is set up for an echo next week.  He comes in today. He says he is feeling better. No cough. No swelling. Not really short of breath. No chest pain. Feels ok on his medicines.   Current Outpatient Prescriptions on File Prior to Visit  Medication Sig Dispense Refill  . aspirin 81 MG tablet Take 81 mg by mouth daily.        [name] atorvastatin (LIPITOR) 40 MG tablet Take 40 mg by mouth daily.        [name] CALCIUM PO Take by mouth daily.        . fish oil-omega-3 fatty acids 1000 MG capsule Take by mouth daily.        [name] FOLIC ACID PO Take by mouth daily.        . metoprolol (LOPRESSOR) 25 MG tablet Take 1 tablet (25 mg total) by mouth 2 (two) times daily.  60 tablet  5  . SIMVASTATIN PO Take by mouth.        . Thiamine HCl (VITAMIN B-1 PO) Take by mouth daily.        [name] warfarin (COUMADIN) 5 MG tablet Take 5 mg by mouth as directed.        . Zolpidem Tartrate (AMBIEN PO) Take by mouth at bedtime.          No Known Allergies  Past Medical History  Diagnosis Date  . Hypertension   . Hyperlipidemia   . Syncope and collapse   . Atrial fibrillation   . Dyspnea   . PVC's (premature ventricular contractions)   . Abscess   . MRSA (methicillin resistant Staphylococcus aureus)   . Cancer 2000    T3N0  . Normal nuclear stress test 2004    Past Surgical History  Procedure Date  . Lumbar laminectomy 1977  . Wound exploration 2006    For possible stitch abscess Dr [name]  . Incise and drain abcess 2009,2010,2011,2012  . Hernia repair [phone]    DB  . Colon surgery 2000    History    Smoking status  . Former Smoker  . Quit date: 10/13/1977  Smokeless tobacco  . Not on file    History  Alcohol Use No    Family History  Problem Relation Age of Onset  . Stroke Mother   . Stroke Father   . Hypertension Father   . Heart attack Father   . Stroke Sister   . Stroke Brother   . Stroke Brother     Review of Systems: The review of systems is per the HPI.  All other systems were reviewed and are negative.  Physical Exam: BP 126/84  Pulse 80  Ht 5\' 10"  (1.778 m)  Wt 196 lb (88.905 kg)  BMI 28.12 kg/m2  SpO2 99% Patient is pleasant and in no acute distress. Skin is warm and dry. Color is normal.  HEENT is unremarkable. Normocephalic/atraumatic. PERRL. Sclera are nonicteric. Neck is supple. No masses. No JVD. Lungs are clear. Cardiac exam shows an irregular rhythm. Rate is controlled. Abdomen is soft.  Extremities are without edema. Gait and ROM are intact. No gross neurologic deficits noted.  LABORATORY DATA: N/A   Assessment / Plan:

## 2011-06-15 NOTE — Assessment & Plan Note (Signed)
This is chronic. Rate is controlled. He remains on coumadin.

## 2011-06-16 ENCOUNTER — Other Ambulatory Visit (HOSPITAL_COMMUNITY): Payer: Medicare Other

## 2011-06-17 DIAGNOSIS — S60429A Blister (nonthermal) of unspecified finger, initial encounter: Secondary | ICD-10-CM | POA: Diagnosis not present

## 2011-06-20 ENCOUNTER — Other Ambulatory Visit (HOSPITAL_COMMUNITY): Payer: Medicare Other

## 2011-06-20 ENCOUNTER — Other Ambulatory Visit (HOSPITAL_COMMUNITY): Payer: Medicare Other | Admitting: Radiology

## 2011-06-21 ENCOUNTER — Ambulatory Visit (HOSPITAL_COMMUNITY): Payer: Medicare Other | Attending: Cardiovascular Disease | Admitting: Radiology

## 2011-06-21 DIAGNOSIS — R55 Syncope and collapse: Secondary | ICD-10-CM | POA: Diagnosis not present

## 2011-06-21 DIAGNOSIS — Z87891 Personal history of nicotine dependence: Secondary | ICD-10-CM | POA: Diagnosis not present

## 2011-06-21 DIAGNOSIS — R0609 Other forms of dyspnea: Secondary | ICD-10-CM | POA: Diagnosis not present

## 2011-06-21 DIAGNOSIS — I4891 Unspecified atrial fibrillation: Secondary | ICD-10-CM | POA: Diagnosis not present

## 2011-06-21 DIAGNOSIS — I4949 Other premature depolarization: Secondary | ICD-10-CM | POA: Insufficient documentation

## 2011-06-21 DIAGNOSIS — R0989 Other specified symptoms and signs involving the circulatory and respiratory systems: Secondary | ICD-10-CM | POA: Insufficient documentation

## 2011-06-21 DIAGNOSIS — R079 Chest pain, unspecified: Secondary | ICD-10-CM | POA: Diagnosis not present

## 2011-06-21 DIAGNOSIS — R0600 Dyspnea, unspecified: Secondary | ICD-10-CM

## 2011-06-21 DIAGNOSIS — E785 Hyperlipidemia, unspecified: Secondary | ICD-10-CM | POA: Diagnosis not present

## 2011-07-18 DIAGNOSIS — H18419 Arcus senilis, unspecified eye: Secondary | ICD-10-CM | POA: Diagnosis not present

## 2011-07-18 DIAGNOSIS — H2510 Age-related nuclear cataract, unspecified eye: Secondary | ICD-10-CM | POA: Diagnosis not present

## 2011-07-18 DIAGNOSIS — H25049 Posterior subcapsular polar age-related cataract, unspecified eye: Secondary | ICD-10-CM | POA: Diagnosis not present

## 2011-07-18 DIAGNOSIS — H25019 Cortical age-related cataract, unspecified eye: Secondary | ICD-10-CM | POA: Diagnosis not present

## 2011-07-19 ENCOUNTER — Encounter (INDEPENDENT_AMBULATORY_CARE_PROVIDER_SITE_OTHER): Payer: Self-pay | Admitting: General Surgery

## 2011-07-19 ENCOUNTER — Ambulatory Visit (INDEPENDENT_AMBULATORY_CARE_PROVIDER_SITE_OTHER): Payer: Medicare Other | Admitting: General Surgery

## 2011-07-19 VITALS — BP 112/70 | HR 87 | Temp 97.0°F | Ht 70.0 in | Wt 193.8 lb

## 2011-07-19 DIAGNOSIS — T8140XA Infection following a procedure, unspecified, initial encounter: Secondary | ICD-10-CM | POA: Diagnosis not present

## 2011-07-19 DIAGNOSIS — T8141XA Infection following a procedure, superficial incisional surgical site, initial encounter: Secondary | ICD-10-CM

## 2011-07-19 HISTORY — DX: Infection following a procedure, superficial incisional surgical site, initial encounter: T81.41XA

## 2011-07-19 NOTE — Progress Notes (Signed)
Subjective:     Patient ID: Christian Maxwell, male   DOB: 1933-05-05, 78 y.o.   MRN: [id number]  HPI Patient has history of chronic abdominal wall stitch abscesses. Patient previously become infected with MRSA. He was scheduled for excision of the area at the lower point of his old scar several months ago. He canceled that surgery. Since then it is doing better. He says it only occasionally drinks. He is having no pain.  Review of Systems     Objective:   Physical Exam  Cardiovascular:       Irregularly irregular  Pulmonary/Chest: Effort normal and breath sounds normal. He has no wheezes.  Abdominal: Soft. Bowel sounds are normal. He exhibits no distension. There is no tenderness. There is no rebound.       Small bud of granulation tissue in the suprapubic region at the lower end of midline incision, minimal clear fluid can be  Expressed,no cellulitis       Assessment:     Chronic stitch abscess    Plan:     I have offered again to excise this area. The patient wants to avoid surgery. There is no evidence of infection.  I am willing to see how it looks in 2 months. We will discuss things again then.

## 2011-07-20 ENCOUNTER — Ambulatory Visit: Payer: Medicare Other | Admitting: Cardiovascular Disease

## 2011-07-25 ENCOUNTER — Ambulatory Visit: Payer: Medicare Other | Admitting: Family Medicine

## 2011-07-27 ENCOUNTER — Telehealth: Payer: Self-pay | Admitting: Gastroenterology

## 2011-07-27 NOTE — Telephone Encounter (Signed)
Pt has tried some milk of magnesia and is constipated. Suggested pt try a bottle of magnesium citrate. Pt instructed to take 1/2 the bottle and wait 1 hour. If no results take the rest of the bottle. Pt verbalized understanding.

## 2011-07-31 ENCOUNTER — Telehealth: Payer: Self-pay

## 2011-07-31 DIAGNOSIS — R413 Other amnesia: Secondary | ICD-10-CM | POA: Diagnosis not present

## 2011-07-31 DIAGNOSIS — E0500 Thyrotoxicosis with diffuse goiter without thyrotoxic crisis or storm: Secondary | ICD-10-CM | POA: Diagnosis not present

## 2011-07-31 DIAGNOSIS — C189 Malignant neoplasm of colon, unspecified: Secondary | ICD-10-CM | POA: Diagnosis not present

## 2011-07-31 DIAGNOSIS — E785 Hyperlipidemia, unspecified: Secondary | ICD-10-CM | POA: Diagnosis not present

## 2011-07-31 NOTE — Telephone Encounter (Signed)
Received VM from pt requesting an appt with Dr Cyndie Chime for "constipation."    Called pt back who reports he is no longer constipated after utilizing magnesium citrate as directed by RN at GI office.  Questions if "Dr Cyndie Chime would want to see me since I was constipated."  Instructed pt he could continue to follow with GI office and if Dr Patsy Lager services were needed GI office would notify us.   Per Dr Patsy Lager office note on 06/20/10 - pt has "graduated" from our practice.  Pt ensured that Dr Cyndie Chime would be glad to see him in the future should the need arise.   Pt verbalizes appreciation and understanding. dph

## 2011-08-02 ENCOUNTER — Encounter (INDEPENDENT_AMBULATORY_CARE_PROVIDER_SITE_OTHER): Payer: Medicare Other | Admitting: General Surgery

## 2011-08-04 ENCOUNTER — Other Ambulatory Visit: Payer: Self-pay | Admitting: *Deleted

## 2011-08-04 MED ORDER — SIMVASTATIN 40 MG PO TABS: 40.0000 mg | ORAL_TABLET | Freq: Every evening | ORAL | Status: DC

## 2011-08-07 DIAGNOSIS — M899 Disorder of bone, unspecified: Secondary | ICD-10-CM | POA: Diagnosis not present

## 2011-08-07 DIAGNOSIS — M949 Disorder of cartilage, unspecified: Secondary | ICD-10-CM | POA: Diagnosis not present

## 2011-08-07 DIAGNOSIS — I2510 Atherosclerotic heart disease of native coronary artery without angina pectoris: Secondary | ICD-10-CM | POA: Diagnosis not present

## 2011-08-07 DIAGNOSIS — M542 Cervicalgia: Secondary | ICD-10-CM | POA: Diagnosis not present

## 2011-08-22 DIAGNOSIS — M79609 Pain in unspecified limb: Secondary | ICD-10-CM | POA: Diagnosis not present

## 2011-08-22 DIAGNOSIS — D2370 Other benign neoplasm of skin of unspecified lower limb, including hip: Secondary | ICD-10-CM | POA: Diagnosis not present

## 2011-08-28 ENCOUNTER — Other Ambulatory Visit: Payer: Medicare Other

## 2011-08-28 ENCOUNTER — Ambulatory Visit (INDEPENDENT_AMBULATORY_CARE_PROVIDER_SITE_OTHER): Payer: Medicare Other | Admitting: Cardiovascular Disease

## 2011-08-28 ENCOUNTER — Encounter: Payer: Self-pay | Admitting: Cardiovascular Disease

## 2011-08-28 VITALS — BP 130/80 | HR 78 | Ht 69.0 in | Wt 194.1 lb

## 2011-08-28 DIAGNOSIS — E785 Hyperlipidemia, unspecified: Secondary | ICD-10-CM

## 2011-08-28 DIAGNOSIS — R0609 Other forms of dyspnea: Secondary | ICD-10-CM

## 2011-08-28 DIAGNOSIS — R0600 Dyspnea, unspecified: Secondary | ICD-10-CM

## 2011-08-28 DIAGNOSIS — I4891 Unspecified atrial fibrillation: Secondary | ICD-10-CM | POA: Diagnosis not present

## 2011-08-28 NOTE — Patient Instructions (Signed)
Your physician wants you to follow-up in: 6 MONTHS  You will receive a reminder letter in the mail two months in advance. If you don't receive a letter, please call our office to schedule the follow-up appointment.  Your physician recommends that you return for a FASTING lipid profile: TODAY AND IN 6 MONTHS  

## 2011-08-28 NOTE — Assessment & Plan Note (Addendum)
He has chronic atrial fibrillation. He is asymptomatic. He remains on Coumadin.

## 2011-08-28 NOTE — Assessment & Plan Note (Signed)
We'll check labs today.  We'll also check and again was seen again in 6 months for an office visit.

## 2011-08-28 NOTE — Progress Notes (Signed)
Christian Maxwell Date of Birth  1932/06/08 [facility], [facility], [facility]     [place]  [address]    [address]   [address] [place], [place]  [address]    [place], [place]  [address] [phone]  Fax  [phone]  [phone]  Fax [phone]   History of Present Illness:  Problem list: 1. Atrial fibrillation 2. Hyperlipidemia 3. Premature ventricular contractions 4. Hypothyroidism    Christian Maxwell is a 78 year old gentleman with a history of atrial fibrillation, hyperlipidemia, and a history of PVCs. He exercises on a regular basis. He has not had any episodes of chest pain or shortness of breath. He denies any syncope or presyncope.  He's doing much better. He was having some shortness of breath earlier in the year and the end of last year.  Current Outpatient Prescriptions on File Prior to Visit  Medication Sig Dispense Refill  . aspirin 81 MG tablet Take 81 mg by mouth daily.        [name] atorvastatin (LIPITOR) 40 MG tablet Take 40 mg by mouth daily.        [name] CALCIUM PO Take by mouth daily.        . fish oil-omega-3 fatty acids 1000 MG capsule Take by mouth daily.        [name] FOLIC ACID PO Take by mouth daily.        . metoprolol (LOPRESSOR) 25 MG tablet Take 1 tablet (25 mg total) by mouth 2 (two) times daily.  60 tablet  5  . simvastatin (ZOCOR) 40 MG tablet Take 1 tablet (40 mg total) by mouth every evening.  30 tablet  6  . Thiamine HCl (VITAMIN B-1 PO) Take by mouth daily.        [name] warfarin (COUMADIN) 5 MG tablet Take 5 mg by mouth as directed.        . Zolpidem Tartrate (AMBIEN PO) Take by mouth at bedtime.          No Known Allergies  Past Medical History  Diagnosis Date  . Hypertension   . Hyperlipidemia   . Syncope and collapse   . Atrial fibrillation   . Dyspnea   . PVC's (premature ventricular contractions)   . Abscess   . MRSA (methicillin resistant Staphylococcus aureus)   . Cancer 2000    T3N0  . Normal nuclear stress test 2004    Past Surgical  History  Procedure Date  . Lumbar laminectomy 1977  . Wound exploration 2006    For possible stitch abscess Dr [name]  . Incise and drain abcess 2009,2010,2011,2012  . Hernia repair [phone]    DB  . Colon surgery 2000    History  Smoking status  . Former Smoker  . Quit date: 10/13/1977  Smokeless tobacco  . Not on file    History  Alcohol Use No    Family History  Problem Relation Age of Onset  . Stroke Mother   . Stroke Father   . Hypertension Father   . Heart attack Father   . Stroke Sister   . Stroke Brother   . Stroke Brother     Reviw of Systems:  Reviewed in the HPI.  All other systems are negative.  Physical Exam: BP 130/80  Pulse 78  Ht 5\' 9"  (1.753 m)  Wt 194 lb 1.9 oz (88.052 kg)  BMI 28.67 kg/m2 The patient is alert and oriented x 3.  The mood and affect are normal.  Skin: warm and dry.  Color is normal.    HEENT:   Normocephalic/atraumatic. No JVD. His carotids are normal.  Lungs: Lungs are clear.   Heart: Irregularly irregular. He has no murmurs.    Abdomen: Is good bowel sounds. There is no hepatomegaly  Extremities:  No clubbing cyanosis or edema.  Neuro:  Exam is nonfocal. His gait is normal.    ECG:  Assessment / Plan:

## 2011-08-29 LAB — LIPID PANEL
Cholesterol: 180 mg/dL (ref 0–200)
HDL: 42.9 mg/dL (ref >39.00)
Triglycerides: 165 mg/dL — ABNORMAL HIGH (ref 0.0–149.0)

## 2011-08-29 LAB — BASIC METABOLIC PANEL
Calcium: 10.2 mg/dL (ref 8.4–10.5)
Creatinine, Ser: 0.9 mg/dL (ref 0.4–1.5)
GFR: 91.7 mL/min (ref >60.00)
Sodium: 140 mEq/L (ref 135–145)

## 2011-08-29 LAB — HEPATIC FUNCTION PANEL
AST: 25 U/L (ref 0–37)
Bilirubin, Direct: 0.1 mg/dL (ref 0.0–0.3)
Total Bilirubin: 1 mg/dL (ref 0.3–1.2)

## 2011-08-30 ENCOUNTER — Other Ambulatory Visit: Payer: Self-pay | Admitting: Cardiovascular Disease

## 2011-08-30 MED ORDER — SIMVASTATIN 40 MG PO TABS: 40.0000 mg | ORAL_TABLET | Freq: Every evening | ORAL | Status: DC

## 2011-09-11 DIAGNOSIS — H2510 Age-related nuclear cataract, unspecified eye: Secondary | ICD-10-CM | POA: Diagnosis not present

## 2011-09-11 DIAGNOSIS — H269 Unspecified cataract: Secondary | ICD-10-CM | POA: Diagnosis not present

## 2011-09-12 DIAGNOSIS — H2510 Age-related nuclear cataract, unspecified eye: Secondary | ICD-10-CM | POA: Diagnosis not present

## 2011-09-20 ENCOUNTER — Ambulatory Visit (INDEPENDENT_AMBULATORY_CARE_PROVIDER_SITE_OTHER): Payer: Medicare Other | Admitting: General Surgery

## 2011-09-20 VITALS — BP 120/70 | HR 72 | Temp 96.7°F | Resp 18 | Wt 195.0 lb

## 2011-09-20 DIAGNOSIS — T8140XA Infection following a procedure, unspecified, initial encounter: Secondary | ICD-10-CM

## 2011-09-20 NOTE — Progress Notes (Signed)
Patient ID: Christian Maxwell, male   DOB: [birth year], 78 y.o.   MRN: [id number]

## 2011-09-20 NOTE — Progress Notes (Signed)
Subjective:     Patient ID: DEV DHONDT, male   DOB: 08/29/1932, 78 y.o.   MRN: [id number]  HPI Patient presents for followup lower midline postoperative stitch abscess. This has been uncomplicated in the past by MRSA infection. He claims it is doing much better. He occasionally has some drainage but this is nearly resolved. He is having no pain.  Review of Systems     Objective:   Physical Exam Awake and alert Lungs clear Abdomen soft, nontender, nondistended. Lower midline has a spot of granulation tissue consistent with his chronic stitch abscess. There is no current drainage. There is no cellulitis. No induration.    Assessment:     Chronic stitch abscess lower abdomen    Plan:     I Again offered to excise this area as a outpatient procedure. I have scheduled this before and the patient canceled. He claims is feeling much better he does not want to go through surgery. He has agreed to call me if his symptoms change at all. If that occurs we will again discuss surgical excision.

## 2011-10-02 DIAGNOSIS — H269 Unspecified cataract: Secondary | ICD-10-CM | POA: Diagnosis not present

## 2011-10-02 DIAGNOSIS — H2510 Age-related nuclear cataract, unspecified eye: Secondary | ICD-10-CM | POA: Diagnosis not present

## 2011-10-04 ENCOUNTER — Telehealth: Payer: Self-pay | Admitting: *Deleted

## 2011-10-04 NOTE — Telephone Encounter (Signed)
Called pt to inform that will need to be seen in coumadin clinic to obtain INR before will be able to refill coumadin. This message left on his mobile phone with clinic number to return call

## 2011-10-19 DIAGNOSIS — I4891 Unspecified atrial fibrillation: Secondary | ICD-10-CM | POA: Diagnosis not present

## 2011-10-19 DIAGNOSIS — M542 Cervicalgia: Secondary | ICD-10-CM | POA: Diagnosis not present

## 2011-11-03 ENCOUNTER — Ambulatory Visit (INDEPENDENT_AMBULATORY_CARE_PROVIDER_SITE_OTHER): Payer: Medicare Other | Admitting: *Deleted

## 2011-11-03 DIAGNOSIS — I4891 Unspecified atrial fibrillation: Secondary | ICD-10-CM

## 2011-11-03 LAB — POCT INR: INR: 3.5

## 2011-11-03 MED ORDER — WARFARIN SODIUM 5 MG PO TABS: ORAL_TABLET | ORAL | Status: DC

## 2011-11-13 DIAGNOSIS — L0291 Cutaneous abscess, unspecified: Secondary | ICD-10-CM

## 2011-11-13 HISTORY — DX: Cutaneous abscess, unspecified: L02.91

## 2011-11-22 ENCOUNTER — Encounter (INDEPENDENT_AMBULATORY_CARE_PROVIDER_SITE_OTHER): Payer: Self-pay | Admitting: General Surgery

## 2011-11-22 ENCOUNTER — Ambulatory Visit (INDEPENDENT_AMBULATORY_CARE_PROVIDER_SITE_OTHER): Payer: Medicare Other | Admitting: General Surgery

## 2011-11-22 ENCOUNTER — Telehealth: Payer: Self-pay | Admitting: Cardiovascular Disease

## 2011-11-22 ENCOUNTER — Encounter: Payer: Self-pay | Admitting: Physician Assistant

## 2011-11-22 ENCOUNTER — Ambulatory Visit (INDEPENDENT_AMBULATORY_CARE_PROVIDER_SITE_OTHER): Payer: Medicare Other | Admitting: Physician Assistant

## 2011-11-22 VITALS — BP 113/80 | HR 74 | Temp 98.6°F | Resp 18 | Ht 70.0 in | Wt 193.4 lb

## 2011-11-22 VITALS — BP 106/74 | HR 90 | Ht 70.0 in | Wt 195.0 lb

## 2011-11-22 DIAGNOSIS — Z01810 Encounter for preprocedural cardiovascular examination: Secondary | ICD-10-CM | POA: Diagnosis not present

## 2011-11-22 DIAGNOSIS — T8140XA Infection following a procedure, unspecified, initial encounter: Secondary | ICD-10-CM

## 2011-11-22 DIAGNOSIS — I4891 Unspecified atrial fibrillation: Secondary | ICD-10-CM | POA: Diagnosis not present

## 2011-11-22 NOTE — Telephone Encounter (Signed)
Pt needs to have surgical clearance for a wall abscess and needs appt asap Christian Maxwell wants to know is there any way pt can seen this Friday when he comes for coumadin I looked at all the PA's schedules and they are booked. I told Christian Maxwell I would have nurse to call her back

## 2011-11-22 NOTE — Progress Notes (Signed)
[address] [place], [place]  [address] Phone: [phone] Fax:  [phone]  Date:  11/22/2011   Name:  Christian Maxwell   DOB:  [birth year]   MRN:  [id number]  PCP:  Johny Sax, MD  Primary Cardiologist:  Dr. Delane Ginger  Primary Electrophysiologist:  None    History of Present Illness: Christian Maxwell is a 78 y.o. male who returns for surgical clearance.  He has a history of atrial fibrillation, hyperlipidemia, post-ablative hypothyroidism and PVCs.  Cardiolite in 2004 negative for ischemia.  Echocardiogram 06/2011: Mild LVH, EF 55-60%, mild MR, moderate LAE, mild to moderate RAE.  He has a history of colectomy and has a chronic stitch abscess that has been followed by general surgery.  He was seen again today and the plan is to proceed with excision.  However, this needs to be done off of Coumadin.  He was referred for clearance today.  He is overall doing well.  Denies chest pain, shortness of breath, syncope and palpitations.  Denies orthopnea, PND or edema.  He describes class 1-2 symptoms.  He can achieve greater than 4 METs without anginal symptoms.     Past Medical History  Diagnosis Date  . Hypertension   . Hyperlipidemia   . Syncope and collapse   . Atrial fibrillation   . Dyspnea   . PVC's (premature ventricular contractions)   . Abscess   . MRSA (methicillin resistant Staphylococcus aureus)   . Cancer 2000    T3N0  . Normal nuclear stress test 2004    Current Outpatient Prescriptions  Medication Sig Dispense Refill  . aspirin 81 MG tablet Take 81 mg by mouth daily.        [name] atorvastatin (LIPITOR) 40 MG tablet Take 40 mg by mouth daily.        [name] CALCIUM PO Take by mouth daily.        . fish oil-omega-3 fatty acids 1000 MG capsule Take by mouth daily.        [name] FOLIC ACID PO Take by mouth daily.        . metoprolol (LOPRESSOR) 25 MG tablet Take 1 tablet (25 mg total) by mouth 2 (two) times daily.  60 tablet  5  . simvastatin (ZOCOR)  40 MG tablet Take 1 tablet (40 mg total) by mouth every evening.  90 tablet  2  . Thiamine HCl (VITAMIN B-1 PO) Take by mouth daily.        [name] warfarin (COUMADIN) 5 MG tablet Take as directed by coumadin clinic  40 tablet  0  . Zolpidem Tartrate (AMBIEN PO) Take by mouth at bedtime.          Allergies: No Known Allergies  History  Substance Use Topics  . Smoking status: Former Smoker    Quit date: 10/13/1977  . Smokeless tobacco: Former Neurosurgeon    Types: Chew    Quit date: 10/13/1977  . Alcohol Use: No     ROS:  Please see the history of present illness.    All other systems reviewed and negative.   PHYSICAL EXAM: VS:  BP 106/74  Pulse 90  Ht 5\' 10"  (1.778 m)  Wt 195 lb (88.451 kg)  BMI 27.98 kg/m2 Well nourished, well developed, in no acute distress HEENT: normal Neck: no JVD Cardiac:  normal S1, S2; Irregularly irregular; no murmur Lungs:  clear to auscultation bilaterally, no wheezing, rhonchi or rales Abd: soft, nontender, no hepatomegaly Ext: no edema  Skin: warm and dry Neuro:  CNs 2-12 intact, no focal abnormalities noted  EKG:  Atrial fibrillation, heart rate 73, normal axis      ASSESSMENT AND PLAN:  1.  Atrial fibrillation The patient does not have any unstable cardiac conditions.  His heart rate in AFib is well controlled.  The patient can achieve 4 METs or greater without anginal symptoms.  According to [facility] and AHA guidelines, the patient requires no further cardiac workup prior to his noncardiac surgery.  The patient should be at acceptable risk.  Our service is available as necessary in the perioperative period.   He has a CHADS2 score of 1 (age).  He is overall low risk for thrombosis.  Bridging anticoagulation is not recommended.  He may hold his Coumadin and aspirin 5 days prior to his procedure and restart postoperatively when felt to be safe by his surgeon.  Follow up with Dr. Delane Ginger scheduled.   Signed, Tereso Newcomer, PA-C  2:24 PM 11/22/2011

## 2011-11-22 NOTE — Progress Notes (Signed)
Patient ID: Christian Maxwell, male   DOB: 12/10/1932, 78 y.o.   MRN: [id number]  Chief Complaint  Patient presents with  . Follow-up    HPI Christian Maxwell is a 78 y.o. male.   HPIPatient has history of chronic recurrent stitch abscess at the lower point of his lower midline incision. It has been infected at times with MRSA. I have scheduled to excise the area several months ago but he canceled as things are improving. Area is bothering him more again and is draining some tan fluid on an intermittent basis. He has no generalized abdominal pain.  Past Medical History  Diagnosis Date  . Hypertension   . Hyperlipidemia   . Syncope and collapse   . Atrial fibrillation   . Dyspnea   . PVC's (premature ventricular contractions)   . Abscess   . MRSA (methicillin resistant Staphylococcus aureus)   . Cancer 2000    T3N0  . Normal nuclear stress test 2004    Past Surgical History  Procedure Date  . Lumbar laminectomy 1977  . Wound exploration 2006    For possible stitch abscess Dr Calil Amor  . Incise and drain abcess 2009,2010,2011,2012  . Hernia repair 5/42004    DB  . Colon surgery 2000    Family History  Problem Relation Age of Onset  . Stroke Mother   . Stroke Father   . Hypertension Father   . Heart attack Father   . Stroke Sister   . Stroke Brother   . Stroke Brother     Social History History  Substance Use Topics  . Smoking status: Former Smoker    Quit date: 10/13/1977  . Smokeless tobacco: Not on file  . Alcohol Use: No    No Known Allergies  Current Outpatient Prescriptions  Medication Sig Dispense Refill  . aspirin 81 MG tablet Take 81 mg by mouth daily.        . atorvastatin (LIPITOR) 40 MG tablet Take 40 mg by mouth daily.        . CALCIUM PO Take by mouth daily.        . fish oil-omega-3 fatty acids 1000 MG capsule Take by mouth daily.        . FOLIC ACID PO Take by mouth daily.        . metoprolol (LOPRESSOR) 25 MG tablet Take 1 tablet (25 mg total)  by mouth 2 (two) times daily.  60 tablet  5  . simvastatin (ZOCOR) 40 MG tablet Take 1 tablet (40 mg total) by mouth every evening.  90 tablet  2  . Thiamine HCl (VITAMIN B-1 PO) Take by mouth daily.        . warfarin (COUMADIN) 5 MG tablet Take as directed by coumadin clinic  40 tablet  0  . Zolpidem Tartrate (AMBIEN PO) Take by mouth at bedtime.          Review of Systems Review of Systems  Constitutional: Negative.   HENT: Negative.   Eyes: Negative.   Respiratory: Negative.   Cardiovascular:       Atrial fibrillation  Gastrointestinal:       See history of present illness  Genitourinary: Negative.   Musculoskeletal: Negative.   Neurological: Negative.     Blood pressure 113/80, pulse 74, temperature 98.6 F (37 C), temperature source Temporal, resp. rate 18, height 5' 10" (1.778 m), weight 193 lb 6.4 oz (87.726 kg).  Physical Exam Physical Exam  Constitutional: He appears well-developed. No distress.    HENT:  Head: Normocephalic.  Eyes: EOM are normal. Pupils are equal, round, and reactive to light.  Neck: Normal range of motion. Neck supple.  Cardiovascular: Normal rate and intact distal pulses.        Irregularly irregular rhythm  Pulmonary/Chest: Effort normal and breath sounds normal. No respiratory distress. He has no wheezes. He has no rales.  Abdominal: Soft. He exhibits no distension. There is no tenderness. There is no rebound and no guarding.       Granulation tissue area with mild tan drainage and inferior aspect of lower midline scar.No significant surrounding erythema or fluctuance.  Musculoskeletal: Normal range of motion.    Data Reviewed   Assessment    Chronic stitch abscess lower midline    Plan    I again offered excision of the area as an outpatient surgical procedure. He'll need to have his Coumadin for 5 days before so we will obtain cardiac clearance for this from Dr.Nahser. Procedure, risks, and benefits were discussed in detail with the  patient. This will be an outpatient procedure. He is agreeable.       [name] E 11/22/2011, 9:56 AM    

## 2011-11-22 NOTE — Patient Instructions (Addendum)
OK TO HOLD YOUR COUMADIN AND ASPIRIN 5 DAYS BEFORE SURGERY AND RE-START AFTER SURGERY PER RECOMMENDATION FROM THE SURGEON.  FOLLOW UP WITH DR. Elease Hashimoto 02/25/12

## 2011-11-22 NOTE — Telephone Encounter (Signed)
App with [employer] PA for today, pt accepting of plan.

## 2011-11-22 NOTE — Patient Instructions (Addendum)
Are schedulers will call you once we have cardiac clearance

## 2011-11-24 ENCOUNTER — Other Ambulatory Visit: Payer: Self-pay | Admitting: General Surgery

## 2011-11-27 NOTE — Progress Notes (Signed)
Pt very active Abscess [id number] MRSA-treated for that  To come in for pt-[id number] Cardiac clearance given

## 2011-11-30 DIAGNOSIS — E0500 Thyrotoxicosis with diffuse goiter without thyrotoxic crisis or storm: Secondary | ICD-10-CM | POA: Diagnosis not present

## 2011-11-30 DIAGNOSIS — R413 Other amnesia: Secondary | ICD-10-CM | POA: Diagnosis not present

## 2011-11-30 DIAGNOSIS — E785 Hyperlipidemia, unspecified: Secondary | ICD-10-CM | POA: Diagnosis not present

## 2011-12-01 ENCOUNTER — Encounter (HOSPITAL_BASED_OUTPATIENT_CLINIC_OR_DEPARTMENT_OTHER)
Admission: RE | Admit: 2011-12-01 | Discharge: 2011-12-01 | Disposition: A | Discharge status: Home / Self Care | Payer: Medicare Other | Source: Ambulatory Visit | Attending: General Surgery | Admitting: General Surgery

## 2011-12-01 DIAGNOSIS — L02219 Cutaneous abscess of trunk, unspecified: Secondary | ICD-10-CM | POA: Diagnosis not present

## 2011-12-01 DIAGNOSIS — Z01812 Encounter for preprocedural laboratory examination: Secondary | ICD-10-CM | POA: Diagnosis not present

## 2011-12-01 DIAGNOSIS — Z8614 Personal history of Methicillin resistant Staphylococcus aureus infection: Secondary | ICD-10-CM | POA: Diagnosis not present

## 2011-12-01 DIAGNOSIS — E785 Hyperlipidemia, unspecified: Secondary | ICD-10-CM | POA: Diagnosis not present

## 2011-12-01 DIAGNOSIS — I10 Essential (primary) hypertension: Secondary | ICD-10-CM | POA: Diagnosis not present

## 2011-12-01 LAB — BASIC METABOLIC PANEL
BUN: 15 mg/dL (ref 6–23)
CO2: 28 mEq/L (ref 19–32)
Calcium: 10.3 mg/dL (ref 8.4–10.5)
Chloride: 103 mEq/L (ref 96–112)
Creatinine, Ser: 0.86 mg/dL (ref 0.50–1.35)
GFR calc Af Amer: >90 mL/min (ref >90)
GFR calc non Af Amer: 82 mL/min — ABNORMAL LOW (ref >90)
Glucose, Bld: 122 mg/dL — ABNORMAL HIGH (ref 70–99)
Potassium: 4.3 mEq/L (ref 3.5–5.1)
Sodium: 141 mEq/L (ref 135–145)

## 2011-12-01 LAB — APTT: aPTT: 28 seconds (ref 24–37)

## 2011-12-04 ENCOUNTER — Encounter (HOSPITAL_BASED_OUTPATIENT_CLINIC_OR_DEPARTMENT_OTHER): Payer: Self-pay | Admitting: Anesthesiology

## 2011-12-04 ENCOUNTER — Encounter (HOSPITAL_BASED_OUTPATIENT_CLINIC_OR_DEPARTMENT_OTHER)
Admission: RE | Disposition: A | Discharge status: Home / Self Care | Payer: Self-pay | Source: Ambulatory Visit | Attending: General Surgery

## 2011-12-04 ENCOUNTER — Ambulatory Visit (HOSPITAL_BASED_OUTPATIENT_CLINIC_OR_DEPARTMENT_OTHER)
Admission: RE | Admit: 2011-12-04 | Discharge: 2011-12-04 | Disposition: A | Discharge status: Home / Self Care | Payer: Medicare Other | Source: Ambulatory Visit | Attending: General Surgery | Admitting: General Surgery

## 2011-12-04 ENCOUNTER — Ambulatory Visit (HOSPITAL_BASED_OUTPATIENT_CLINIC_OR_DEPARTMENT_OTHER): Payer: Medicare Other | Admitting: Anesthesiology

## 2011-12-04 ENCOUNTER — Encounter (HOSPITAL_BASED_OUTPATIENT_CLINIC_OR_DEPARTMENT_OTHER): Payer: Self-pay | Admitting: *Deleted

## 2011-12-04 DIAGNOSIS — I10 Essential (primary) hypertension: Secondary | ICD-10-CM | POA: Diagnosis not present

## 2011-12-04 DIAGNOSIS — T8140XA Infection following a procedure, unspecified, initial encounter: Secondary | ICD-10-CM | POA: Diagnosis not present

## 2011-12-04 DIAGNOSIS — Z8614 Personal history of Methicillin resistant Staphylococcus aureus infection: Secondary | ICD-10-CM | POA: Diagnosis not present

## 2011-12-04 DIAGNOSIS — L03319 Cellulitis of trunk, unspecified: Secondary | ICD-10-CM

## 2011-12-04 DIAGNOSIS — L02219 Cutaneous abscess of trunk, unspecified: Secondary | ICD-10-CM | POA: Insufficient documentation

## 2011-12-04 DIAGNOSIS — Z01812 Encounter for preprocedural laboratory examination: Secondary | ICD-10-CM | POA: Insufficient documentation

## 2011-12-04 DIAGNOSIS — E785 Hyperlipidemia, unspecified: Secondary | ICD-10-CM | POA: Diagnosis not present

## 2011-12-04 SURGERY — INCISION AND DRAINAGE, ABSCESS
Anesthesia: General | Site: Abdomen | Wound class: Dirty or Infected

## 2011-12-04 MED ORDER — VANCOMYCIN HCL IN DEXTROSE 1-5 GM/200ML-% IV SOLN
1000.0000 mg | INTRAVENOUS | Status: AC
  Administered 2011-12-04 (×2): 1000 mg via INTRAVENOUS

## 2011-12-04 MED ORDER — LIDOCAINE HCL (CARDIAC) 20 MG/ML IV SOLN
INTRAVENOUS | Status: DC | PRN
  Administered 2011-12-04: 50 mg via INTRAVENOUS

## 2011-12-04 MED ORDER — FENTANYL CITRATE 0.05 MG/ML IJ SOLN
INTRAMUSCULAR | Status: DC | PRN
  Administered 2011-12-04 (×2): 50 ug via INTRAVENOUS

## 2011-12-04 MED ORDER — LACTATED RINGERS IV SOLN
INTRAVENOUS | Status: DC
  Administered 2011-12-04: 08:00:00 via INTRAVENOUS

## 2011-12-04 MED ORDER — METOCLOPRAMIDE HCL 5 MG/ML IJ SOLN
INTRAMUSCULAR | Status: DC | PRN
  Administered 2011-12-04: 10 mg via INTRAVENOUS

## 2011-12-04 MED ORDER — PROPOFOL 10 MG/ML IV EMUL
INTRAVENOUS | Status: DC | PRN
  Administered 2011-12-04: 200 mg via INTRAVENOUS

## 2011-12-04 MED ORDER — BUPIVACAINE-EPINEPHRINE 0.5% -1:200000 IJ SOLN
INTRAMUSCULAR | Status: DC | PRN
  Administered 2011-12-04: 20 mL

## 2011-12-04 MED ORDER — ONDANSETRON HCL 4 MG/2ML IJ SOLN
INTRAMUSCULAR | Status: DC | PRN
  Administered 2011-12-04: 4 mg via INTRAVENOUS

## 2011-12-04 MED ORDER — OXYCODONE-ACETAMINOPHEN 5-325 MG PO TABS: 1.0000 | ORAL_TABLET | Freq: Four times a day (QID) | ORAL | Status: AC | PRN

## 2011-12-04 MED ORDER — DEXAMETHASONE SODIUM PHOSPHATE 4 MG/ML IJ SOLN
INTRAMUSCULAR | Status: DC | PRN
  Administered 2011-12-04: 8 mg via INTRAVENOUS

## 2011-12-04 SURGICAL SUPPLY — 57 items
ADH SKN CLS APL DERMABOND .7 (GAUZE/BANDAGES/DRESSINGS) ×1
APL SKNCLS STERI-STRIP NONHPOA (GAUZE/BANDAGES/DRESSINGS)
BENZOIN TINCTURE PRP APPL 2/3 (GAUZE/BANDAGES/DRESSINGS) IMPLANT
BLADE HEX COATED 2.75 (ELECTRODE) ×2 IMPLANT
BLADE SURG 15 STRL LF DISP TIS (BLADE) ×1 IMPLANT
BLADE SURG 15 STRL SS (BLADE) ×2
BLADE SURG ROTATE 9660 (MISCELLANEOUS) IMPLANT
CANISTER SUCTION 1200CC (MISCELLANEOUS) IMPLANT
CHLORAPREP W/TINT 26ML (MISCELLANEOUS) IMPLANT
CLOTH BEACON ORANGE TIMEOUT ST (SAFETY) ×2 IMPLANT
COVER MAYO STAND STRL (DRAPES) ×2 IMPLANT
COVER TABLE BACK 60X90 (DRAPES) ×2 IMPLANT
DECANTER SPIKE VIAL GLASS SM (MISCELLANEOUS) ×1 IMPLANT
DERMABOND ADVANCED (GAUZE/BANDAGES/DRESSINGS) ×1
DERMABOND ADVANCED .7 DNX12 (GAUZE/BANDAGES/DRESSINGS) IMPLANT
DRAPE PED LAPAROTOMY (DRAPES) ×2 IMPLANT
DRAPE U-SHAPE 76X120 STRL (DRAPES) IMPLANT
DRAPE UTILITY XL STRL (DRAPES) ×2 IMPLANT
ELECT REM PT RETURN 9FT ADLT (ELECTROSURGICAL) ×2
ELECTRODE REM PT RTRN 9FT ADLT (ELECTROSURGICAL) ×1 IMPLANT
GAUZE SPONGE 4X4 12PLY STRL LF (GAUZE/BANDAGES/DRESSINGS) ×2 IMPLANT
GLOVE BIO SURGEON STRL SZ7 (GLOVE) ×1 IMPLANT
GLOVE BIO SURGEON STRL SZ8 (GLOVE) ×2 IMPLANT
GLOVE BIOGEL PI IND STRL 8 (GLOVE) ×1 IMPLANT
GLOVE BIOGEL PI INDICATOR 8 (GLOVE) ×1
GOWN PREVENTION PLUS XLARGE (GOWN DISPOSABLE) ×1 IMPLANT
GOWN PREVENTION PLUS XXLARGE (GOWN DISPOSABLE) ×3 IMPLANT
NDL HYPO 25X1 1.5 SAFETY (NEEDLE) ×1 IMPLANT
NEEDLE 27GAX1X1/2 (NEEDLE) IMPLANT
NEEDLE HYPO 25X1 1.5 SAFETY (NEEDLE) ×2 IMPLANT
NS IRRIG 1000ML POUR BTL (IV SOLUTION) IMPLANT
PACK BASIN DAY SURGERY FS (CUSTOM PROCEDURE TRAY) ×2 IMPLANT
PENCIL BUTTON HOLSTER BLD 10FT (ELECTRODE) ×2 IMPLANT
SHEET MEDIUM DRAPE 40X70 STRL (DRAPES) IMPLANT
SLEEVE SCD COMPRESS KNEE MED (MISCELLANEOUS) IMPLANT
SPONGE GAUZE 4X4 12PLY (GAUZE/BANDAGES/DRESSINGS) IMPLANT
SPONGE LAP 4X18 X RAY DECT (DISPOSABLE) IMPLANT
STRIP CLOSURE SKIN 1/2X4 (GAUZE/BANDAGES/DRESSINGS) IMPLANT
SUT ETHILON 3 0 PS 1 (SUTURE) IMPLANT
SUT ETHILON 5 0 PS 2 18 (SUTURE) IMPLANT
SUT MON AB 4-0 PC3 18 (SUTURE) IMPLANT
SUT SILK 2 0 FS (SUTURE) IMPLANT
SUT VIC AB 3-0 PS1 18 (SUTURE)
SUT VIC AB 3-0 PS1 18XBRD (SUTURE) IMPLANT
SUT VIC AB 3-0 SH 27 (SUTURE)
SUT VIC AB 3-0 SH 27X BRD (SUTURE) IMPLANT
SUT VIC AB 4-0 SH 18 (SUTURE) ×2 IMPLANT
SUT VIC AB 5-0 PS2 18 (SUTURE) IMPLANT
SWAB CULTURE LIQ STUART DBL (MISCELLANEOUS) IMPLANT
SYR BULB 3OZ (MISCELLANEOUS) IMPLANT
SYR CONTROL 10ML LL (SYRINGE) ×2 IMPLANT
TOWEL OR 17X24 6PK STRL BLUE (TOWEL DISPOSABLE) ×3 IMPLANT
TOWEL OR NON WOVEN STRL DISP B (DISPOSABLE) ×2 IMPLANT
TUBE ANAEROBIC SPECIMEN COL (MISCELLANEOUS) IMPLANT
TUBE CONNECTING 20X1/4 (TUBING) IMPLANT
WATER STERILE IRR 1000ML POUR (IV SOLUTION) IMPLANT
YANKAUER SUCT BULB TIP NO VENT (SUCTIONS) IMPLANT

## 2011-12-04 NOTE — Anesthesia Preprocedure Evaluation (Signed)
Anesthesia Evaluation  Patient identified by MRN, date of birth, ID band Patient awake    Reviewed: Allergy & Precautions, H&P , NPO status , Patient's Chart, lab work & pertinent test results, reviewed documented beta blocker date and time   Airway Mallampati: II TM Distance: >3 FB Neck ROM: full    Dental   Pulmonary shortness of breath and with exertion,          Cardiovascular hypertension, On Medications and On Home Beta Blockers + dysrhythmias Atrial Fibrillation     Neuro/Psych negative neurological ROS  negative psych ROS   GI/Hepatic negative GI ROS, Neg liver ROS,   Endo/Other  negative endocrine ROS  Renal/GU negative Renal ROS  negative genitourinary   Musculoskeletal   Abdominal   Peds  Hematology negative hematology ROS (+)   Anesthesia Other Findings See surgeon's H&P   Reproductive/Obstetrics negative OB ROS                           Anesthesia Physical Anesthesia Plan  ASA: III  Anesthesia Plan: General   Post-op Pain Management:    Induction: Intravenous  Airway Management Planned: LMA  Additional Equipment:   Intra-op Plan:   Post-operative Plan: Extubation in OR  Informed Consent: I have reviewed the patients History and Physical, chart, labs and discussed the procedure including the risks, benefits and alternatives for the proposed anesthesia with the patient or authorized representative who has indicated his/her understanding and acceptance.   Dental Advisory Given  Plan Discussed with: CRNA and Surgeon  Anesthesia Plan Comments:         Anesthesia Quick Evaluation

## 2011-12-04 NOTE — Anesthesia Procedure Notes (Addendum)
Performed by: Zenia Resides D   Procedure Name: LMA Insertion Date/Time: 12/04/2011 8:38 AM Performed by: Zenia Resides D Pre-anesthesia Checklist: Patient identified, Emergency Drugs available, Suction available, Patient being monitored and Timeout performed Patient Re-evaluated:Patient Re-evaluated prior to inductionOxygen Delivery Method: Circle System Utilized Preoxygenation: Pre-oxygenation with 100% oxygen Intubation Type: IV induction Ventilation: Mask ventilation without difficulty LMA: LMA inserted LMA Size: 4.0 Number of attempts: 1 Airway Equipment and Method: bite block Placement Confirmation: positive ETCO2 and breath sounds checked- equal and bilateral Tube secured with: Tape Dental Injury: Teeth and Oropharynx as per pre-operative assessment

## 2011-12-04 NOTE — Anesthesia Postprocedure Evaluation (Signed)
Anesthesia Post Note  Patient: Christian Maxwell  Procedure(s) Performed: Procedure(s) (LRB): INCISION AND DRAINAGE ABSCESS (N/A)  Anesthesia type: General  Patient location: PACU  Post pain: Pain level controlled  Post assessment: Patient's Cardiovascular Status Stable  Last Vitals:  Filed Vitals:   12/04/11 0945  BP: 126/84  Pulse: 52  Temp:   Resp: 13    Post vital signs: Reviewed and stable  Level of consciousness: alert  Complications: No apparent anesthesia complications

## 2011-12-04 NOTE — Interval H&P Note (Signed)
History and Physical Interval Note:  12/04/2011 8:26 AM  Christian Maxwell  has presented today for surgery, with the diagnosis of chronic stitch abscess  The various methods of treatment have been discussed with the patient and family. After consideration of risks, benefits and other options for treatment, the patient has consented to  Procedure(s) (LRB): INCISION AND DRAINAGE ABSCESS (N/A) as a surgical intervention .  The patient's history has been reviewed, patient re-examined, no change in status, stable for surgery.  I have reviewed the patient's chart and labs.  Questions were answered to the patient's satisfaction.     Samie Barclift E

## 2011-12-04 NOTE — Transfer of Care (Signed)
Immediate Anesthesia Transfer of Care Note  Patient: Christian Maxwell  Procedure(s) Performed: Procedure(s) (LRB): INCISION AND DRAINAGE ABSCESS (N/A)  Patient Location: PACU  Anesthesia Type: General  Level of Consciousness: awake  Airway & Oxygen Therapy: Patient Spontanous Breathing and Patient connected to face mask oxygen  Post-op Assessment: Report given to PACU RN and Post -op Vital signs reviewed and stable  Post vital signs: Reviewed and stable  Complications: No apparent anesthesia complications

## 2011-12-04 NOTE — H&P (View-Only) (Signed)
Patient ID: Christian Maxwell, male   DOB: 1932/11/16, 78 y.o.   MRN: [id number]  Chief Complaint  Patient presents with  . Follow-up    HPI Christian Maxwell is a 78 y.o. male.   HPIPatient has history of chronic recurrent stitch abscess at the lower point of his lower midline incision. It has been infected at times with MRSA. I have scheduled to excise the area several months ago but he canceled as things are improving. Area is bothering him more again and is draining some tan fluid on an intermittent basis. He has no generalized abdominal pain.  Past Medical History  Diagnosis Date  . Hypertension   . Hyperlipidemia   . Syncope and collapse   . Atrial fibrillation   . Dyspnea   . PVC's (premature ventricular contractions)   . Abscess   . MRSA (methicillin resistant Staphylococcus aureus)   . Cancer 2000    T3N0  . Normal nuclear stress test 2004    Past Surgical History  Procedure Date  . Lumbar laminectomy 1977  . Wound exploration 2006    For possible stitch abscess Dr [name]  . Incise and drain abcess 2009,2010,2011,2012  . Hernia repair [phone]    DB  . Colon surgery 2000    Family History  Problem Relation Age of Onset  . Stroke Mother   . Stroke Father   . Hypertension Father   . Heart attack Father   . Stroke Sister   . Stroke Brother   . Stroke Brother     Social History History  Substance Use Topics  . Smoking status: Former Smoker    Quit date: 10/13/1977  . Smokeless tobacco: Not on file  . Alcohol Use: No    No Known Allergies  Current Outpatient Prescriptions  Medication Sig Dispense Refill  . aspirin 81 MG tablet Take 81 mg by mouth daily.        [name] atorvastatin (LIPITOR) 40 MG tablet Take 40 mg by mouth daily.        [name] CALCIUM PO Take by mouth daily.        . fish oil-omega-3 fatty acids 1000 MG capsule Take by mouth daily.        [name] FOLIC ACID PO Take by mouth daily.        . metoprolol (LOPRESSOR) 25 MG tablet Take 1 tablet (25 mg total)  by mouth 2 (two) times daily.  60 tablet  5  . simvastatin (ZOCOR) 40 MG tablet Take 1 tablet (40 mg total) by mouth every evening.  90 tablet  2  . Thiamine HCl (VITAMIN B-1 PO) Take by mouth daily.        [name] warfarin (COUMADIN) 5 MG tablet Take as directed by coumadin clinic  40 tablet  0  . Zolpidem Tartrate (AMBIEN PO) Take by mouth at bedtime.          Review of Systems Review of Systems  Constitutional: Negative.   HENT: Negative.   Eyes: Negative.   Respiratory: Negative.   Cardiovascular:       Atrial fibrillation  Gastrointestinal:       See history of present illness  Genitourinary: Negative.   Musculoskeletal: Negative.   Neurological: Negative.     Blood pressure 113/80, pulse 74, temperature 98.6 F (37 C), temperature source Temporal, resp. rate 18, height 5\' 10"  (1.778 m), weight 193 lb 6.4 oz (87.726 kg).  Physical Exam Physical Exam  Constitutional: He appears well-developed. No distress.  HENT:  Head: Normocephalic.  Eyes: EOM are normal. Pupils are equal, round, and reactive to light.  Neck: Normal range of motion. Neck supple.  Cardiovascular: Normal rate and intact distal pulses.        Irregularly irregular rhythm  Pulmonary/Chest: Effort normal and breath sounds normal. No respiratory distress. He has no wheezes. He has no rales.  Abdominal: Soft. He exhibits no distension. There is no tenderness. There is no rebound and no guarding.       Granulation tissue area with mild tan drainage and inferior aspect of lower midline scar.No significant surrounding erythema or fluctuance.  Musculoskeletal: Normal range of motion.    Data Reviewed   Assessment    Chronic stitch abscess lower midline    Plan    I again offered excision of the area as an outpatient surgical procedure. He'll need to have his Coumadin for 5 days before so we will obtain cardiac clearance for this from Dr.Nahser. Procedure, risks, and benefits were discussed in detail with the  patient. This will be an outpatient procedure. He is agreeable.       Levenia Skalicky E 11/22/2011, 9:56 AM

## 2011-12-04 NOTE — Op Note (Signed)
12/04/2011  9:14 AM  PATIENT:  Christian Maxwell  78 y.o. male  PRE-OPERATIVE DIAGNOSIS:  Chronic abdominal wall abscess  POST-OPERATIVE DIAGNOSIS:  Chronic abdominal wall abscess  PROCEDURE:  Procedure(s):  EXCISION CHRONIC ABDOMINAL WALL ABSCESS  SURGEON:  Surgeon(s): [name], MD  PHYSICIAN ASSISTANT:   ASSISTANTS: none   ANESTHESIA:   local and general  EBL:     BLOOD ADMINISTERED:none  DRAINS: none   SPECIMEN:  Excision  DISPOSITION OF SPECIMEN:  PATHOLOGY  COUNTS:  YES  DICTATION: .Dragon Dictation Patient presents for excision of chronic abdominal wall abscess. This has grown MRSA in the past.He was identified in the preop holding area. He received vancomycin for antibiotics intravenously. His site was marked. Informed consent was obtained. He was brought to the operating room and general anesthesia with laryngeal mask airway was administered by the anesthesia staff. His abdomen was prepped and draped in sterile fashion. We did a time out procedure. 1/4% marcaine with epi was injected. An elliptical incision was made transversely to the encompass the entirety of the chronic inflammation area and facilitate closure. Subcutaneous tissues were dissected down. There was a central mass of chronic inflammation the tract down to the anterior fascia. This was all excised just off of the anterior fascia. I did not see a stitch, however, I excised the entirety en mass.  This was sent to pathology. The fascia remained intact without hernia. Hemostasis was obtained and subcutaneous tissues. Some additional local anesthetic was injected. Area was copiously irrigated. Subcutaneous tissues were approximated with interrupted 3-0 Vicryl sutures. Skin was closed with running 4-0 Monocryl subcuticular stitch followed by Dermabond. Sponge needle and instrument counts were correct. Patient tolerated procedure well without apparent complication was taken recovery in stable condition. There  were no apparent complications.  PATIENT DISPOSITION:  PACU - hemodynamically stable.   Delay start of Pharmacological VTE agent (>24hrs) due to surgical blood loss or risk of bleeding:  no  Violeta Gelinas, MD, MPH, FACS Pager: [phone]  7/22/20139:14 AM

## 2011-12-05 ENCOUNTER — Ambulatory Visit (INDEPENDENT_AMBULATORY_CARE_PROVIDER_SITE_OTHER): Payer: Medicare Other | Admitting: General Surgery

## 2011-12-05 ENCOUNTER — Encounter (INDEPENDENT_AMBULATORY_CARE_PROVIDER_SITE_OTHER): Payer: Self-pay | Admitting: General Surgery

## 2011-12-05 ENCOUNTER — Telehealth (INDEPENDENT_AMBULATORY_CARE_PROVIDER_SITE_OTHER): Payer: Self-pay | Admitting: General Surgery

## 2011-12-05 VITALS — BP 98/62 | HR 64 | Temp 98.1°F | Resp 18

## 2011-12-05 DIAGNOSIS — IMO0001 Reserved for inherently not codable concepts without codable children: Secondary | ICD-10-CM

## 2011-12-05 DIAGNOSIS — Z4800 Encounter for change or removal of nonsurgical wound dressing: Secondary | ICD-10-CM

## 2011-12-05 NOTE — Telephone Encounter (Signed)
The patient contacted the office stating he is having dark red blood coming from incision site and is concerned it is to much, The patient denies changing his bandage since surgery yesterday he would like for [place] to look at the area. He will call back when his wife comes home and come to the office for a nurse visit.

## 2011-12-05 NOTE — Progress Notes (Signed)
The patient enters the office c/o bleeding from incision site, pt had surgery yesterday for a post operative stitch abscess. Incision is approximately 3-4 inches long with a small area of dehiscence on the left approximately 1/8 in.. It appears that the glue had come off of the incision in that area. 3 steri strips 1/4 in placed in the area, the patient was advised to let them fall off on their own, shower as normal and to contact the office if the bleeding continues, explained the difference between seepage and bleeding. Area dressed with guaze. Patient verbalized understanding.

## 2011-12-06 ENCOUNTER — Telehealth (INDEPENDENT_AMBULATORY_CARE_PROVIDER_SITE_OTHER): Payer: Self-pay

## 2011-12-06 NOTE — Telephone Encounter (Signed)
Patient called in complaining of his wound still leaking blood.  He wonders if he should be seen.  I spoke to Dr [name] and he says this is expected due to the size of incision and area he removed.  He wants him to reinforce the bandage and call us before the weekend to let us know if it is no better.  I called the patient and asked him to reinforce with gauze and call me Friday morning if no better.  I tentatively scheduled him for Friday at 2:45 in the urgent clinic and told him we can cancel if needed.  He agrees.

## 2011-12-07 ENCOUNTER — Encounter (HOSPITAL_BASED_OUTPATIENT_CLINIC_OR_DEPARTMENT_OTHER): Payer: Self-pay

## 2011-12-08 ENCOUNTER — Encounter (INDEPENDENT_AMBULATORY_CARE_PROVIDER_SITE_OTHER): Payer: Self-pay | Admitting: General Surgery

## 2011-12-08 ENCOUNTER — Ambulatory Visit (INDEPENDENT_AMBULATORY_CARE_PROVIDER_SITE_OTHER): Payer: Medicare Other | Admitting: General Surgery

## 2011-12-08 VITALS — BP 128/70 | HR 76 | Temp 97.0°F | Resp 18 | Ht 70.0 in | Wt 191.8 lb

## 2011-12-08 DIAGNOSIS — Z9884 Bariatric surgery status: Secondary | ICD-10-CM

## 2011-12-08 NOTE — Progress Notes (Signed)
History: The patient returns to the office early having had excision of a stitch abscess by Dr. [name] for days ago. The first couple of days after surgery he had some moderate bleeding from the incision. It is actually not bled anymore today. He's not having any unusual pain or fever.  Exam: There was a gauze with some old blood on it that I removed. There is a moderate amount of ecchymosis suprapubically along the incision. The incision itself has a Steri-Strip and Dermabond appears intact. There is no active bleeding or any significant hematoma that I can see.  The wound was redressed and he will change her dry gauze dressing daily. He will cause for any further concerns as needed getting back to the office within 2 weeks.

## 2011-12-08 NOTE — Progress Notes (Deleted)
History: Patient returns for followup lab and placed in 2009. She has had very good weight loss but we had to remove half the fluid from her band one month ago due to worsening reflux symptoms. This completely relieved her reflux symptoms and she is no longer requiring acid reduction medication. She also feels like she is hungry her needing more. She has been on 4 pounds in one month.  Exam: BP 128/70  Pulse 76  Temp 97 F (36.1 C) (Temporal)  Resp 18  Ht 5\' 10"  (1.778 m)  Wt 191 lb 12.8 oz (87 kg)  BMI 27.52 kg/m2  Gen.: Appears well Abdomen: Soft and nontender  Assessment and plan: Reflux relieved likely secondary to restriction. We elected to go ahead with the refill today and I added 1 cc to bring her to 3 cc. She will likely need about 3-1/2-3.75 cc ultimately. Return in one month.

## 2011-12-08 NOTE — Addendum Note (Signed)
Addended by: Glenna Fellows T on: 12/08/2011 03:04 PM   Modules accepted: Level of Service

## 2011-12-11 ENCOUNTER — Telehealth (INDEPENDENT_AMBULATORY_CARE_PROVIDER_SITE_OTHER): Payer: Self-pay

## 2011-12-11 NOTE — Telephone Encounter (Signed)
Pathology consistent with foreign body reaction most likely from suture.  Thanks

## 2011-12-11 NOTE — Telephone Encounter (Signed)
The patient called about his incision.  He says there is still some drainage on the bandage.  He wants to know if he should come by and let me look at it.  He was seen Friday by Dr Johna Sheriff and told to change the dressing daily.  I asked him to remove the bandage on it now and make sure he is showering and keeping it clean and dry.  He then should apply a clean gauze bandage to it daily.  He and his wife who got on the phone with me asked about the lab results from surgery.  I saw a pathology report and told them those results.  They want to know if it showed that the stitch was in what was removed that keeps causing the abscess.  He keeps having this recur every 3 years or so. I told them from what I read, it doesn't say that the stitch was in the mass.  They asked about MRSA and I didn't see where that was tested.  The patient has an appointment 8/14.  I asked him to call if there are anymore problems.

## 2011-12-12 ENCOUNTER — Ambulatory Visit (INDEPENDENT_AMBULATORY_CARE_PROVIDER_SITE_OTHER): Payer: Medicare Other | Admitting: Surgery

## 2011-12-12 ENCOUNTER — Encounter (INDEPENDENT_AMBULATORY_CARE_PROVIDER_SITE_OTHER): Payer: Self-pay | Admitting: Surgery

## 2011-12-12 VITALS — BP 112/62 | HR 88 | Temp 96.9°F | Resp 20 | Ht 70.0 in | Wt 191.8 lb

## 2011-12-12 DIAGNOSIS — IMO0002 Reserved for concepts with insufficient information to code with codable children: Secondary | ICD-10-CM | POA: Insufficient documentation

## 2011-12-12 NOTE — Patient Instructions (Signed)
WOUND CARE  It is important that the wound be kept open.   -Keeping the skin edges apart will allow the wound to gradually heal from the base upwards.   - If the skin edges of the wound close too early, a new fluid pocket can form and infection can occur. -This is the reason to pack deeper wounds with gauze or ribbon -This is why drained wounds cannot be sewed closed right away  A healthy wound should form a lining of bright red "beefy" granulating tissue that will help shrink the wound and help the edges grow new skin into it.   -A little mucus / yellow discharge is normal (the body's natural way to try and form a scab) and should be gently washed off with soap and water with daily dressing changes.  -Green or foul smelling drainage implies bacterial colonization and can slow wound healing - a short course of antibiotic ointment (3-5 days) can help it clear up.  Call the doctor if it does not improve or worsens  -Avoid use of antibiotic ointments for more than a week as they can slow wound healing over time.    -Sometimes other wound care products will be used to reduce need for dressing changes and/or help clean up dirty wounds -Sometimes the surgeon needs to debride the wound in the office to remove dead or infected tissue out of the wound so it can heal more quickly and safely.    Change the dressing at least once a day -Wash the wound with mild soap and water gently every day.  It is good to shower or bathe the wound to help it clean out. -Use clean 4x4 gauze for medium/large wounds or ribbon plain NU-gauze for smaller wounds (it does not need to be sterile, just clean) -Keep the raw wound moist with a little saline or KY (saline) gel on the gauze.  -A dry wound will take longer to heal.  -Keep the skin dry around the wound to prevent breakdown and irritation. -Pack the wound down to the base -The goal is to keep the skin apart, not overpack the wound -Use a Q-tip or blunt-tipped kabob  stick toothpick to push the gauze down to the base in narrow or deep wounds   -Cover with a clean gauze and tape -paper or Medipore tape tend to be gentle on the skin -rotate the orientation of the tape to avoid repeated stress/trauma on the skin -using an ACE or Coban wrap on wounds on arms or legs can be used instead.  Complete all antibiotics through the entire prescription to help the infection heal and prevent new places of infection   Returning the see the surgeon is helpful to follow the healing process and help the wound close as fast as possible.    Hematoma A hematoma is a pocket of blood that collects under the skin, in an organ, in a body space, in a joint space, or in other tissue. The blood can clot to form a lump that you can see and feel. The lump is often firm, sore, and sometimes even painful and tender. Most hematomas get better in a few days to weeks. However, some hematomas may be serious and require medical care.Hematomas can range in size from very small to very large. CAUSES  A hematoma can be caused by a blunt or penetrating injury. It can also be caused by leakage from a blood vessel under the skin. Spontaneous leakage from a blood vessel is  more likely to occur in elderly people, especially those taking blood thinners. Sometimes, a hematoma can develop after certain medical procedures. SYMPTOMS  Unlike a bruise, a hematoma forms a firm lump that you can feel. This lump is the collection of blood. The collection of blood can also cause your skin to turn a blue to dark blue color. If the hematoma is close to the surface of the skin, it often produces a yellowish color in the skin. DIAGNOSIS  Your caregiver can determine whether you have a hematoma based on your history and a physical exam. TREATMENT  Hematomas usually go away on their own over time. Rarely does the blood need to be drained out of the body. HOME CARE INSTRUCTIONS   Put ice on the injured area.   Put  ice in a plastic bag.   Place a towel between your skin and the bag.   Leave the ice on for 15 to 20 minutes, 3 to 4 times a day for the first 1 to 2 days.   After the first 2 days, switch to using warm compresses on the hematoma.   Elevate the injured area to help decrease pain and swelling. Wrapping the area with an elastic bandage may also be helpful. Compression helps to reduce swelling and promotes shrinking of the hematoma. Make sure the bandage is not wrapped too tight.   If your hematoma is on a lower extremity and is painful, crutches may be helpful for a couple days.   Only take over-the-counter or prescription medicines for pain, discomfort, or fever as directed by your caregiver. Most patients can take acetaminophen or ibuprofen for the pain.  SEEK IMMEDIATE MEDICAL CARE IF:   You have increasing pain, or your pain is not controlled with medicine.   You have a fever.   You have worsening swelling or discoloration.   Your skin over the hematoma breaks or starts bleeding.  MAKE SURE YOU:   Understand these instructions.   Will watch your condition.   Will get help right away if you are not doing well or get worse.  Document Released: 12/14/2003 Document Revised: 04/20/2011 Document Reviewed: 01/02/2011 [facility] Patient Information 2012 [place], [place].

## 2011-12-12 NOTE — Progress Notes (Signed)
Subjective:     Patient ID: Christian Maxwell, male   DOB: 1933/03/01, 78 y.o.   MRN: [id number]  HPI  Christian Maxwell  Mar 07, 1933 [id number]  Patient Care Team: [name], MD as PCP - General [name], MD as Consulting Physician (General Surgery)  This patient is a 78 y.o.male who presents today for surgical evaluation.   Procedure: Excision of fibrotic material in the lower abdomen, probable stitch abscess  Pathology:  FINAL DIAGNOSIS Diagnosis Soft tissue mass, simple excision, Abdominal wall - FIBROSIS, INFLAMMATION AND FOCAL FOREIGN BODY REACTION. - NO EVIDENCE OF MALIGNANCY. [name] MD Pathologist, Electronic Signature (Case signed 12/05/2011) Specimen [name] and Clinical Information Specimen(s) Obtained: Soft tissue mass, simple excision, Abdominal wall Specimen Clinical Information Chronic stitch abscess (gat) [name] Received in fixative is a mass of yellow lobulated adipose tissue measuring 3.5 x 2.5 x 2.5 cm. It displays an adherent ellipse of tan white, wrinkled, hair bearing skin measuring 3.3 x 1.0 cm. The mass is firm to palpation. Sectioning reveals a markedly fibrotic area located in the center of the specimen, containing what appears to be a 0.5 cm area of abscess cavity. A presentative section is submitted in a single block. (JC:eps 12/04/11)  Reason for visit: Concern of possible wound infection.  Patient is eight days from removal of some chronic scar tissue concerning for a stitch abscess.  No evidence of any definite stitch/abscess.  Area was primarily closed.  Patient is chronically anticoagulation on warfarin.  His back on that..Some swelling around his incision and bruising as well.  Has had a couple instances of bloody drainage.  Less now.  His wife is concerned that perhaps he was getting an infection in needed to be on antibiotics.  No fevers or chills.  Patient denies any severe pain.  His wife wanted him to be seen today so we fit  him in urgently.  Both of them were sort of tag-teaming me with numerous questions.  Patient Active Problem List  Diagnosis  . INSOMNIA, MILD  . INJURY, SUPERFICIAL, HEAD NEC W/O INFECTION  . Atrial fibrillation  . Hyperlipidemia  . Chest pain  . Dizziness  . Dyspnea  . Hematoma, postoperative    Past Medical History  Diagnosis Date  . Hypertension   . Hyperlipidemia   . Syncope and collapse   . Atrial fibrillation   . Dyspnea   . PVC's (premature ventricular contractions)   . Abscess   . MRSA (methicillin resistant Staphylococcus aureus)   . Cancer 2000    T3N0  . Normal nuclear stress test 2004  . METHICILLIN RESISTANT STAPHYLOCOCCUS AUREUS INFECTION 12/12/2006    Annotation: stitch abcess in lower abdomen after colon cancer resection Qualifier: Diagnosis of  By: [name] MD, [name]    . Postoperative stitch abscess 07/19/2011    Past Surgical History  Procedure Date  . Lumbar laminectomy 1977  . Wound exploration 2006    For possible stitch abscess Dr [name]  . Incise and drain abcess 2009,2010,2011,2012  . Hernia repair [phone]    DB  . Colon surgery 2000    History   Social History  . Marital Status: Married    Spouse Name: N/A    Number of Children: N/A  . Years of Education: N/A   Occupational History  . Not on file.   Social History Main Topics  . Smoking status: Former Smoker    Quit date: 10/13/1977  . Smokeless tobacco: Former Neurosurgeon    Types:  [name]    Quit date: 10/13/1977  . Alcohol Use: No  . Drug Use: No  . Sexually Active: Yes   Other Topics Concern  . Not on file   Social History Narrative  . No narrative on file    Family History  Problem Relation Age of Onset  . Stroke Mother   . Stroke Father   . Hypertension Father   . Heart attack Father   . Stroke Sister   . Stroke Brother   . Stroke Brother     Current Outpatient Prescriptions  Medication Sig Dispense Refill  . aspirin 81 MG tablet Take 81 mg by mouth daily.         [name] CALCIUM PO Take by mouth daily.        . fish oil-omega-3 fatty acids 1000 MG capsule Take by mouth daily.        [name] FOLIC ACID PO Take by mouth daily.        . metoprolol (LOPRESSOR) 25 MG tablet Take 1 tablet (25 mg total) by mouth 2 (two) times daily.  60 tablet  5  . oxyCODONE-acetaminophen (ROXICET) 5-325 MG per tablet Take 1-2 tablets by mouth every 6 (six) hours as needed for pain.  30 tablet  0  . simvastatin (ZOCOR) 40 MG tablet Take 1 tablet (40 mg total) by mouth every evening.  90 tablet  2  . Thiamine HCl (VITAMIN B-1 PO) Take by mouth daily.        [name] warfarin (COUMADIN) 5 MG tablet Take as directed by coumadin clinic  40 tablet  0  . Zolpidem Tartrate (AMBIEN PO) Take by mouth at bedtime.           No Known Allergies  BP 112/62  Pulse 88  Temp 96.9 F (36.1 C) (Temporal)  Resp 20  Ht 5\' 10"  (1.778 m)  Wt 191 lb 12.8 oz (87 kg)  BMI 27.52 kg/m2  No results found.   Review of Systems  Constitutional: Negative for fever, chills and diaphoresis.  HENT: Negative for sore throat, trouble swallowing and neck pain.   Eyes: Negative for photophobia and visual disturbance.  Respiratory: Negative for choking and shortness of breath.   Cardiovascular: Negative for chest pain and palpitations.  Gastrointestinal: Positive for abdominal pain. Negative for nausea, vomiting, diarrhea, constipation, blood in stool, abdominal distention, anal bleeding and rectal pain.  Genitourinary: Negative for dysuria, urgency, difficulty urinating and testicular pain.  Musculoskeletal: Negative for myalgias, arthralgias and gait problem.  Skin: Positive for color change. Negative for rash.  Neurological: Negative for dizziness, speech difficulty, weakness and numbness.  Hematological: Negative for adenopathy. Bruises/bleeds easily.  Psychiatric/Behavioral: Negative for hallucinations, confusion and agitation.       Objective:   Physical Exam  Constitutional: He is oriented to person,  place, and time. He appears well-developed and well-nourished. No distress.  HENT:  Head: Normocephalic.  Mouth/Throat: Oropharynx is clear and moist. No oropharyngeal exudate.  Eyes: Conjunctivae and EOM are normal. Pupils are equal, round, and reactive to light. No scleral icterus.  Neck: Normal range of motion. No tracheal deviation present.  Cardiovascular: Normal rate, normal heart sounds and intact distal pulses.   Pulmonary/Chest: Effort normal. No respiratory distress.  Abdominal: Soft. He exhibits no distension. There is no tenderness. Hernia confirmed negative in the right inguinal area and confirmed negative in the left inguinal area.         Incisions clean with normal healing ridges.  No hernias  Musculoskeletal: Normal range of motion. He exhibits no tenderness.  Neurological: He is alert and oriented to person, place, and time. No cranial nerve deficit. He exhibits normal muscle tone. Coordination normal.  Skin: Skin is warm and dry. No rash noted. He is not diaphoretic.  Psychiatric: He has a normal mood and affect. His behavior is normal.       Assessment:     Postoperative hematoma with ecchymosis and a chronic anticoagulated patient.  No evidence of cellulitis    Plan:     I went and sterilely probed through the granulation tissue centrally.  I released a few milliliters of old hematoma.  No purulence.  Rest of closure intact.  I placed a wick of ribbon gauze in it.  He tolerated well.  Tried to reassure the patient and his wife that there is no evidence of cellulitis or abscess.  Allow the wick to stay in for a few days and let it fall out.  Allow whole blood to drain out.  Do heating pads to help encourage area to heal up.  Followup with Dr. [name] later to make sure the area heals up.  If the area becomes much more painful or hot or tender or has more purulent drainage, then may benefit from antibiotics.  However, given the fact year he has a history of MRSA as  complicated medically, I do not want to put him on antibiotics anymore that he needs to be.  He and his wife seemed to be reassured after reexplaining things a few times

## 2011-12-18 ENCOUNTER — Ambulatory Visit (INDEPENDENT_AMBULATORY_CARE_PROVIDER_SITE_OTHER): Payer: Medicare Other | Admitting: General Surgery

## 2011-12-18 ENCOUNTER — Other Ambulatory Visit: Payer: Self-pay | Admitting: Cardiovascular Disease

## 2011-12-18 ENCOUNTER — Encounter (INDEPENDENT_AMBULATORY_CARE_PROVIDER_SITE_OTHER): Payer: Self-pay | Admitting: General Surgery

## 2011-12-18 VITALS — BP 106/66 | HR 82 | Temp 96.8°F | Ht 70.0 in | Wt 193.0 lb

## 2011-12-18 DIAGNOSIS — Z09 Encounter for follow-up examination after completed treatment for conditions other than malignant neoplasm: Secondary | ICD-10-CM

## 2011-12-18 MED ORDER — METOPROLOL TARTRATE 25 MG PO TABS: 25.0000 mg | ORAL_TABLET | Freq: Two times a day (BID) | ORAL | Status: DC

## 2011-12-18 NOTE — Telephone Encounter (Signed)
[phone] regarding met[facility]rolol med.  Warfarin med was recvd via surecript, but no metoprolol.

## 2011-12-18 NOTE — Progress Notes (Signed)
Pt doing well with less discomfort and minimal pain  Exam: Wound is somewhat indurated but no erythema,  Small open area in center probed but no unusual drainage  A/P Progressing well.  F/U arranged Dr [name] in two week.

## 2011-12-20 ENCOUNTER — Ambulatory Visit (INDEPENDENT_AMBULATORY_CARE_PROVIDER_SITE_OTHER): Payer: Medicare Other | Admitting: Pharmacist

## 2011-12-20 DIAGNOSIS — I4891 Unspecified atrial fibrillation: Secondary | ICD-10-CM | POA: Diagnosis not present

## 2011-12-22 ENCOUNTER — Encounter (INDEPENDENT_AMBULATORY_CARE_PROVIDER_SITE_OTHER): Payer: Medicare Other | Admitting: General Surgery

## 2011-12-27 ENCOUNTER — Ambulatory Visit (INDEPENDENT_AMBULATORY_CARE_PROVIDER_SITE_OTHER): Payer: Medicare Other | Admitting: General Surgery

## 2011-12-27 ENCOUNTER — Encounter (INDEPENDENT_AMBULATORY_CARE_PROVIDER_SITE_OTHER): Payer: Self-pay | Admitting: General Surgery

## 2011-12-27 VITALS — BP 124/86 | HR 87 | Temp 98.1°F | Resp 20 | Ht 70.0 in | Wt 193.2 lb

## 2011-12-27 DIAGNOSIS — T8140XA Infection following a procedure, unspecified, initial encounter: Secondary | ICD-10-CM

## 2011-12-27 NOTE — Patient Instructions (Addendum)
Please call if any further problems and be sure to let me know if this small area on the back of your right arm does not go away

## 2011-12-27 NOTE — Progress Notes (Signed)
Subjective:     Patient ID: Christian Maxwell, male   DOB: 21-Jul-1932, 78 y.o.   MRN: [id number]  HPI Patient presents status post excision of chronic stitch granuloma lower midline wound. His wound has healed and not draining anything for one week.  Review of Systems     Objective:   Physical Exam Wound is healed there is no evidence of ongoing infections no drainage during the exam the patient showed me a small area of the back of his right arm which feels like a small subcutaneous mass with overlying skin change, possibly a cyst. He says it has gotten smaller over the past week.    Assessment:     Doing well postoperatively    Plan:     No further wound care necessary. Return when necessary. Advised patient to watch this area on his arm closely for the next month. If it does not go away I would like to see him again as it may need to be removed. I answered his questions.

## 2012-01-08 DIAGNOSIS — Z7901 Long term (current) use of anticoagulants: Secondary | ICD-10-CM | POA: Diagnosis not present

## 2012-01-08 DIAGNOSIS — S6980XA Other specified injuries of unspecified wrist, hand and finger(s), initial encounter: Secondary | ICD-10-CM | POA: Diagnosis not present

## 2012-01-08 DIAGNOSIS — L03019 Cellulitis of unspecified finger: Secondary | ICD-10-CM | POA: Diagnosis not present

## 2012-01-08 DIAGNOSIS — Z79899 Other long term (current) drug therapy: Secondary | ICD-10-CM | POA: Diagnosis not present

## 2012-01-08 DIAGNOSIS — S6990XA Unspecified injury of unspecified wrist, hand and finger(s), initial encounter: Secondary | ICD-10-CM | POA: Diagnosis not present

## 2012-01-08 DIAGNOSIS — L02519 Cutaneous abscess of unspecified hand: Secondary | ICD-10-CM | POA: Diagnosis not present

## 2012-01-08 DIAGNOSIS — L0291 Cutaneous abscess, unspecified: Secondary | ICD-10-CM | POA: Diagnosis not present

## 2012-01-08 DIAGNOSIS — I252 Old myocardial infarction: Secondary | ICD-10-CM | POA: Diagnosis not present

## 2012-01-17 ENCOUNTER — Ambulatory Visit (INDEPENDENT_AMBULATORY_CARE_PROVIDER_SITE_OTHER): Payer: Medicare Other | Admitting: Pharmacist

## 2012-01-17 ENCOUNTER — Encounter (INDEPENDENT_AMBULATORY_CARE_PROVIDER_SITE_OTHER): Payer: Self-pay | Admitting: General Surgery

## 2012-01-17 ENCOUNTER — Ambulatory Visit (INDEPENDENT_AMBULATORY_CARE_PROVIDER_SITE_OTHER): Payer: Medicare Other | Admitting: General Surgery

## 2012-01-17 VITALS — BP 128/80 | HR 88 | Temp 98.6°F | Resp 20 | Ht 70.0 in | Wt 192.8 lb

## 2012-01-17 DIAGNOSIS — I4891 Unspecified atrial fibrillation: Secondary | ICD-10-CM | POA: Diagnosis not present

## 2012-01-17 DIAGNOSIS — T8141XA Infection following a procedure, superficial incisional surgical site, initial encounter: Secondary | ICD-10-CM

## 2012-01-17 DIAGNOSIS — T8140XA Infection following a procedure, unspecified, initial encounter: Secondary | ICD-10-CM

## 2012-01-17 NOTE — Progress Notes (Signed)
Subjective:     Patient ID: Christian Maxwell, male   DOB: 06/17/1932, 78 y.o.   MRN: [id number]  HPI Patient presents status post excision of chronic stitch abscess in lower abdominal wall. He is doing well. No wound drainage.  Review of Systems     Objective:   Physical Exam Abdomen is soft and nontender. Incision clean dry and intact. No drainage. No infection.    Assessment:     Doing well status post excision of chronic stitch abscess    Plan:     May stop antibiotics. He is cleared to write his bike and return to normal activities. Return when necessary.

## 2012-02-07 ENCOUNTER — Ambulatory Visit (INDEPENDENT_AMBULATORY_CARE_PROVIDER_SITE_OTHER): Payer: Medicare Other | Admitting: Pharmacist

## 2012-02-07 DIAGNOSIS — I4891 Unspecified atrial fibrillation: Secondary | ICD-10-CM

## 2012-02-12 ENCOUNTER — Telehealth: Payer: Self-pay | Admitting: General Surgery

## 2012-02-12 NOTE — Telephone Encounter (Signed)
Returned call regarding surgery for a mass on his R arm.  No answer. Left message

## 2012-02-13 ENCOUNTER — Telehealth: Payer: Self-pay | Admitting: General Surgery

## 2012-02-13 ENCOUNTER — Other Ambulatory Visit: Payer: Self-pay | Admitting: General Surgery

## 2012-02-13 NOTE — Telephone Encounter (Signed)
I returned patient's call regarding scheduling surgery for removal of mass right arm.  We discussed this in the office previously.  I spoke to his wife and we will proceed with scheduling.

## 2012-02-28 ENCOUNTER — Ambulatory Visit: Payer: Medicare Other | Admitting: Cardiovascular Disease

## 2012-02-28 ENCOUNTER — Ambulatory Visit (INDEPENDENT_AMBULATORY_CARE_PROVIDER_SITE_OTHER): Payer: Medicare Other | Admitting: *Deleted

## 2012-02-28 ENCOUNTER — Encounter: Payer: Self-pay | Admitting: Cardiovascular Disease

## 2012-02-28 ENCOUNTER — Ambulatory Visit (INDEPENDENT_AMBULATORY_CARE_PROVIDER_SITE_OTHER): Payer: Medicare Other | Admitting: Cardiovascular Disease

## 2012-02-28 VITALS — BP 120/72 | HR 57 | Ht 70.0 in | Wt 193.0 lb

## 2012-02-28 DIAGNOSIS — I4891 Unspecified atrial fibrillation: Secondary | ICD-10-CM

## 2012-02-28 LAB — POCT INR: INR: 2.9

## 2012-02-28 NOTE — Assessment & Plan Note (Addendum)
Christian Maxwell  is doing well. He's not having episodes of shortness breath or dizziness.  We will continue with his same medications. I seen again in 6 months for a followup office visit.  He has a small mass on his right arm. He is at low risk for having this removed. He may stop Coumadin for 5 days prior to the excision of this small mass. He should restart his Coumadin on the day after surgery if his wound is stable from the surgeon's standpoint.

## 2012-02-28 NOTE — Progress Notes (Signed)
Christian Maxwell Date of Birth  1932-07-05 [facility]     [place]  [address]    [address]   [address] [place], [place]  [address]    [place], [place]  [address] [phone]  Fax  [phone]  [phone]  Fax [phone]   History of Present Illness:  Problem list: 1. Atrial fibrillation 2. Hyperlipidemia 3. Premature ventricular contractions 4. Hypothyroidism    Christian Maxwell is a 78 year old gentleman with a history of atrial fibrillation, hyperlipidemia, and a history of PVCs. He exercises on a regular basis. He has not had any episodes of chest pain or shortness of breath. He denies any syncope or presyncope.  He's doing much better. He was having some shortness of breath earlier in the year and the end of last year.  Current Outpatient Prescriptions on File Prior to Visit  Medication Sig Dispense Refill  . aspirin 81 MG tablet Take 81 mg by mouth daily.        [name] CALCIUM PO Take by mouth daily.        . fish oil-omega-3 fatty acids 1000 MG capsule Take by mouth daily.        [name] FOLIC ACID PO Take by mouth daily.        . metoprolol tartrate (LOPRESSOR) 25 MG tablet Take 1 tablet (25 mg total) by mouth 2 (two) times daily.  60 tablet  5  . simvastatin (ZOCOR) 40 MG tablet Take 1 tablet (40 mg total) by mouth every evening.  90 tablet  2  . Thiamine HCl (VITAMIN B-1 PO) Take by mouth daily.        [name] warfarin (COUMADIN) 5 MG tablet TAKE AS DIRECTED BY COUMADIN CLINIC  40 tablet  2  . Zolpidem Tartrate (AMBIEN PO) Take by mouth at bedtime.          No Known Allergies  Past Medical History  Diagnosis Date  . Hypertension   . Hyperlipidemia   . Syncope and collapse   . Atrial fibrillation   . Dyspnea   . PVC's (premature ventricular contractions)   . Abscess   . MRSA (methicillin resistant Staphylococcus aureus)   . Cancer 2000    T3N0  . Normal nuclear stress test 2004  . METHICILLIN RESISTANT STAPHYLOCOCCUS AUREUS INFECTION 12/12/2006   Annotation: stitch abcess in lower abdomen after colon cancer resection Qualifier: Diagnosis of  By: [name] MD, [name]    . Postoperative stitch abscess 07/19/2011    Past Surgical History  Procedure Date  . Lumbar laminectomy 1977  . Wound exploration 2006    For possible stitch abscess Dr [name]  . Incise and drain abcess 2009,2010,2011,2012  . Hernia repair [phone]    DB  . Colon surgery 2000    History  Smoking status  . Former Smoker  . Quit date: 10/13/1977  Smokeless tobacco  . Former Neurosurgeon  . Types: Chew  . Quit date: 10/13/1977    History  Alcohol Use No    Family History  Problem Relation Age of Onset  . Stroke Mother   . Stroke Father   . Hypertension Father   . Heart attack Father   . Stroke Sister   . Stroke Brother   . Stroke Brother     Reviw of Systems:  Reviewed in the HPI.  All other systems are negative.  Physical Exam: BP 120/72  Pulse 57  Ht 5\' 10"  (1.778 m)  Wt 193 lb (87.544 kg)  BMI 27.69 kg/m2  SpO2 97% The patient is alert and oriented x 3.  The mood and affect are normal.   Skin: warm and dry.  Color is normal.    HEENT:   Normocephalic/atraumatic. No JVD. His carotids are normal.  Lungs: Lungs are clear.   Heart: Irregularly irregular. He has no murmurs.    Abdomen: Is good bowel sounds. There is no hepatomegaly  Extremities:  No clubbing cyanosis or edema.  There is a small mobile mass on his right elbow. It is nontender. There is no drainage.  Neuro:  Exam is nonfocal. His gait is normal.    ECG:  Assessment / Plan:

## 2012-02-28 NOTE — Patient Instructions (Addendum)
Your physician wants you to follow-up in: 6 MONTHS You will receive a reminder letter in the mail two months in advance. If you don't receive a letter, please call our office to schedule the follow-up appointment. 

## 2012-03-01 ENCOUNTER — Encounter (HOSPITAL_BASED_OUTPATIENT_CLINIC_OR_DEPARTMENT_OTHER): Payer: Self-pay | Admitting: *Deleted

## 2012-03-04 ENCOUNTER — Encounter: Payer: Self-pay | Admitting: Cardiovascular Disease

## 2012-03-04 NOTE — Progress Notes (Signed)
To come in for pt [id number] ekg 7/13 Saw dr Melburn Popper 02/28/12 for cardiac clearance

## 2012-03-05 ENCOUNTER — Encounter: Payer: Self-pay | Admitting: Cardiovascular Disease

## 2012-03-05 ENCOUNTER — Encounter (HOSPITAL_BASED_OUTPATIENT_CLINIC_OR_DEPARTMENT_OTHER)
Admission: RE | Admit: 2012-03-05 | Discharge: 2012-03-05 | Disposition: A | Discharge status: Home / Self Care | Payer: Medicare Other | Source: Ambulatory Visit | Attending: General Surgery | Admitting: General Surgery

## 2012-03-05 DIAGNOSIS — R229 Localized swelling, mass and lump, unspecified: Secondary | ICD-10-CM | POA: Diagnosis not present

## 2012-03-05 DIAGNOSIS — I10 Essential (primary) hypertension: Secondary | ICD-10-CM | POA: Diagnosis not present

## 2012-03-05 LAB — BASIC METABOLIC PANEL
BUN: 16 mg/dL (ref 6–23)
CO2: 30 mEq/L (ref 19–32)
Chloride: 102 mEq/L (ref 96–112)
Creatinine, Ser: 0.85 mg/dL (ref 0.50–1.35)
GFR calc Af Amer: >90 mL/min (ref >90)
Glucose, Bld: 109 mg/dL — ABNORMAL HIGH (ref 70–99)
Potassium: 4.3 mEq/L (ref 3.5–5.1)

## 2012-03-05 LAB — APTT: aPTT: 30 seconds (ref 24–37)

## 2012-03-05 NOTE — Progress Notes (Signed)
Lab cleared with dr singer for pt

## 2012-03-07 ENCOUNTER — Ambulatory Visit (HOSPITAL_BASED_OUTPATIENT_CLINIC_OR_DEPARTMENT_OTHER): Payer: Medicare Other | Admitting: Anesthesiology

## 2012-03-07 ENCOUNTER — Ambulatory Visit (HOSPITAL_BASED_OUTPATIENT_CLINIC_OR_DEPARTMENT_OTHER)
Admission: RE | Admit: 2012-03-07 | Discharge: 2012-03-07 | Disposition: A | Discharge status: Home / Self Care | Payer: Medicare Other | Source: Ambulatory Visit | Attending: General Surgery | Admitting: General Surgery

## 2012-03-07 ENCOUNTER — Encounter (HOSPITAL_BASED_OUTPATIENT_CLINIC_OR_DEPARTMENT_OTHER)
Admission: RE | Disposition: A | Discharge status: Home / Self Care | Payer: Self-pay | Source: Ambulatory Visit | Attending: General Surgery

## 2012-03-07 ENCOUNTER — Encounter (HOSPITAL_BASED_OUTPATIENT_CLINIC_OR_DEPARTMENT_OTHER): Payer: Self-pay | Admitting: Anesthesiology

## 2012-03-07 ENCOUNTER — Encounter (HOSPITAL_BASED_OUTPATIENT_CLINIC_OR_DEPARTMENT_OTHER): Payer: Self-pay | Admitting: *Deleted

## 2012-03-07 DIAGNOSIS — I10 Essential (primary) hypertension: Secondary | ICD-10-CM | POA: Insufficient documentation

## 2012-03-07 DIAGNOSIS — D2110 Benign neoplasm of connective and other soft tissue of unspecified upper limb, including shoulder: Secondary | ICD-10-CM

## 2012-03-07 DIAGNOSIS — L0390 Cellulitis, unspecified: Secondary | ICD-10-CM | POA: Diagnosis not present

## 2012-03-07 DIAGNOSIS — L0291 Cutaneous abscess, unspecified: Secondary | ICD-10-CM | POA: Diagnosis not present

## 2012-03-07 DIAGNOSIS — R229 Localized swelling, mass and lump, unspecified: Secondary | ICD-10-CM | POA: Diagnosis not present

## 2012-03-07 DIAGNOSIS — R2230 Localized swelling, mass and lump, unspecified upper limb: Secondary | ICD-10-CM

## 2012-03-07 HISTORY — PX: MASS EXCISION: SHX2000

## 2012-03-07 SURGERY — EXCISION MASS
Anesthesia: General | Site: Arm Upper | Laterality: Right

## 2012-03-07 MED ORDER — PROPOFOL 10 MG/ML IV BOLUS
INTRAVENOUS | Status: DC | PRN
  Administered 2012-03-07: 150 mg via INTRAVENOUS

## 2012-03-07 MED ORDER — ONDANSETRON HCL 4 MG/2ML IJ SOLN
INTRAMUSCULAR | Status: DC | PRN
  Administered 2012-03-07: 4 mg via INTRAVENOUS

## 2012-03-07 MED ORDER — CHLORHEXIDINE GLUCONATE 4 % EX LIQD: 1.0000 "application " | Freq: Once | CUTANEOUS | Status: DC

## 2012-03-07 MED ORDER — FENTANYL CITRATE 0.05 MG/ML IJ SOLN
INTRAMUSCULAR | Status: DC | PRN
  Administered 2012-03-07: 50 ug via INTRAVENOUS

## 2012-03-07 MED ORDER — CEFAZOLIN SODIUM-DEXTROSE 2-3 GM-% IV SOLR
2.0000 g | INTRAVENOUS | Status: AC
  Administered 2012-03-07: 2 g via INTRAVENOUS

## 2012-03-07 MED ORDER — LIDOCAINE HCL (CARDIAC) 20 MG/ML IV SOLN
INTRAVENOUS | Status: DC | PRN
  Administered 2012-03-07: 25 mg via INTRAVENOUS

## 2012-03-07 MED ORDER — OXYCODONE-ACETAMINOPHEN 5-325 MG PO TABS: 1.0000 | ORAL_TABLET | ORAL | Status: DC | PRN

## 2012-03-07 MED ORDER — ONDANSETRON HCL 4 MG/2ML IJ SOLN: 4.0000 mg | Freq: Once | INTRAMUSCULAR | Status: DC | PRN

## 2012-03-07 MED ORDER — ACETAMINOPHEN 10 MG/ML IV SOLN: 1000.0000 mg | Freq: Once | INTRAVENOUS | Status: DC | PRN

## 2012-03-07 MED ORDER — LACTATED RINGERS IV SOLN
INTRAVENOUS | Status: DC
  Administered 2012-03-07: 12:00:00 via INTRAVENOUS

## 2012-03-07 MED ORDER — FENTANYL CITRATE 0.05 MG/ML IJ SOLN: INTRAMUSCULAR | Status: DC | PRN

## 2012-03-07 MED ORDER — BUPIVACAINE-EPINEPHRINE 0.25% -1:200000 IJ SOLN
INTRAMUSCULAR | Status: DC | PRN
  Administered 2012-03-07: 10 mL

## 2012-03-07 MED ORDER — HYDROMORPHONE HCL PF 1 MG/ML IJ SOLN: 0.2500 mg | INTRAMUSCULAR | Status: DC | PRN

## 2012-03-07 MED ORDER — DEXAMETHASONE SODIUM PHOSPHATE 4 MG/ML IJ SOLN
INTRAMUSCULAR | Status: DC | PRN
  Administered 2012-03-07: 10 mg via INTRAVENOUS

## 2012-03-07 SURGICAL SUPPLY — 57 items
ADH SKN CLS APL DERMABOND .7 (GAUZE/BANDAGES/DRESSINGS)
APL SKNCLS STERI-STRIP NONHPOA (GAUZE/BANDAGES/DRESSINGS)
BENZOIN TINCTURE PRP APPL 2/3 (GAUZE/BANDAGES/DRESSINGS) IMPLANT
BLADE HEX COATED 2.75 (ELECTRODE) ×2 IMPLANT
BLADE SURG 15 STRL LF DISP TIS (BLADE) ×1 IMPLANT
BLADE SURG 15 STRL SS (BLADE) ×2
BLADE SURG ROTATE 9660 (MISCELLANEOUS) IMPLANT
CANISTER SUCTION 1200CC (MISCELLANEOUS) IMPLANT
CHLORAPREP W/TINT 26ML (MISCELLANEOUS) ×2 IMPLANT
CLOTH BEACON ORANGE TIMEOUT ST (SAFETY) ×2 IMPLANT
COVER MAYO STAND STRL (DRAPES) ×2 IMPLANT
COVER TABLE BACK 60X90 (DRAPES) ×2 IMPLANT
DECANTER SPIKE VIAL GLASS SM (MISCELLANEOUS) ×2 IMPLANT
DERMABOND ADVANCED (GAUZE/BANDAGES/DRESSINGS)
DERMABOND ADVANCED .7 DNX12 (GAUZE/BANDAGES/DRESSINGS) IMPLANT
DRAPE PED LAPAROTOMY (DRAPES) ×2 IMPLANT
DRAPE U-SHAPE 76X120 STRL (DRAPES) IMPLANT
DRAPE UTILITY XL STRL (DRAPES) ×2 IMPLANT
ELECT REM PT RETURN 9FT ADLT (ELECTROSURGICAL) ×2
ELECTRODE REM PT RTRN 9FT ADLT (ELECTROSURGICAL) ×1 IMPLANT
GAUZE SPONGE 4X4 12PLY STRL LF (GAUZE/BANDAGES/DRESSINGS) ×2 IMPLANT
GLOVE BIO SURGEON STRL SZ7 (GLOVE) ×2 IMPLANT
GLOVE BIO SURGEON STRL SZ8 (GLOVE) ×2 IMPLANT
GLOVE BIOGEL PI IND STRL 7.0 (GLOVE) ×2 IMPLANT
GLOVE BIOGEL PI IND STRL 8 (GLOVE) ×1 IMPLANT
GLOVE BIOGEL PI INDICATOR 7.0 (GLOVE) ×2
GLOVE BIOGEL PI INDICATOR 8 (GLOVE) ×1
GOWN PREVENTION PLUS XLARGE (GOWN DISPOSABLE) ×2 IMPLANT
GOWN PREVENTION PLUS XXLARGE (GOWN DISPOSABLE) ×2 IMPLANT
NDL HYPO 25X1 1.5 SAFETY (NEEDLE) ×1 IMPLANT
NEEDLE 27GAX1X1/2 (NEEDLE) IMPLANT
NEEDLE HYPO 25X1 1.5 SAFETY (NEEDLE) ×2 IMPLANT
NS IRRIG 1000ML POUR BTL (IV SOLUTION) IMPLANT
PACK BASIN DAY SURGERY FS (CUSTOM PROCEDURE TRAY) ×2 IMPLANT
PENCIL BUTTON HOLSTER BLD 10FT (ELECTRODE) ×2 IMPLANT
SHEET MEDIUM DRAPE 40X70 STRL (DRAPES) IMPLANT
SLEEVE SCD COMPRESS KNEE MED (MISCELLANEOUS) ×1 IMPLANT
SPONGE GAUZE 4X4 12PLY (GAUZE/BANDAGES/DRESSINGS) IMPLANT
SPONGE LAP 4X18 X RAY DECT (DISPOSABLE) IMPLANT
STRIP CLOSURE SKIN 1/2X4 (GAUZE/BANDAGES/DRESSINGS) IMPLANT
SUT ETHILON 3 0 PS 1 (SUTURE) IMPLANT
SUT ETHILON 5 0 PS 2 18 (SUTURE) IMPLANT
SUT MON AB 4-0 PC3 18 (SUTURE) ×1 IMPLANT
SUT SILK 2 0 FS (SUTURE) IMPLANT
SUT VIC AB 3-0 PS1 18 (SUTURE)
SUT VIC AB 3-0 PS1 18XBRD (SUTURE) IMPLANT
SUT VIC AB 3-0 SH 27 (SUTURE)
SUT VIC AB 3-0 SH 27X BRD (SUTURE) IMPLANT
SUT VIC AB 4-0 SH 18 (SUTURE) ×2 IMPLANT
SUT VIC AB 5-0 PS2 18 (SUTURE) IMPLANT
SYR BULB 3OZ (MISCELLANEOUS) IMPLANT
SYR CONTROL 10ML LL (SYRINGE) ×2 IMPLANT
TOWEL OR 17X24 6PK STRL BLUE (TOWEL DISPOSABLE) ×4 IMPLANT
TOWEL OR NON WOVEN STRL DISP B (DISPOSABLE) ×2 IMPLANT
TUBE CONNECTING 20X1/4 (TUBING) IMPLANT
WATER STERILE IRR 1000ML POUR (IV SOLUTION) ×2 IMPLANT
YANKAUER SUCT BULB TIP NO VENT (SUCTIONS) IMPLANT

## 2012-03-07 NOTE — Interval H&P Note (Signed)
History and Physical Interval Note:  03/07/2012 12:27 PM  Christian Maxwell  has presented today for surgery, with the diagnosis of mass right arm  The various methods of treatment have been discussed with the patient and family. After consideration of risks, benefits and other options for treatment, the patient has consented to  Procedure(s) (LRB) with comments: EXCISION MASS (Right) - removal mass right arm as a surgical intervention .  The patient's history has been reviewed, patient examined, no change in status, stable for surgery.  I have reviewed the patient's chart and labs.  Questions were answered to the patient's satisfaction.     [name] E

## 2012-03-07 NOTE — Anesthesia Procedure Notes (Signed)
Procedure Name: LMA Insertion Performed by: Vasil Juhasz W Pre-anesthesia Checklist: Patient identified, Timeout performed, Emergency Drugs available, Suction available and Patient being monitored Patient Re-evaluated:Patient Re-evaluated prior to inductionOxygen Delivery Method: Circle system utilized Preoxygenation: Pre-oxygenation with 100% oxygen Intubation Type: IV induction Ventilation: Mask ventilation without difficulty LMA: LMA inserted LMA Size: 4.0 Number of attempts: 1 Placement Confirmation: breath sounds checked- equal and bilateral and positive ETCO2 Tube secured with: Tape Dental Injury: Teeth and Oropharynx as per pre-operative assessment      

## 2012-03-07 NOTE — H&P (Signed)
Christian Maxwell is an 78 y.o. male.   Chief Complaint: mass posterior R arm HPI: patient presents for removal mass posterior right arm  Past Medical History  Diagnosis Date  . Hypertension   . Hyperlipidemia   . Syncope and collapse   . Atrial fibrillation   . Dyspnea   . PVC's (premature ventricular contractions)   . Abscess 7/13    abd abscess  . MRSA (methicillin resistant Staphylococcus aureus)   . Cancer 2000    T3N0  . Normal nuclear stress test 2004  . METHICILLIN RESISTANT STAPHYLOCOCCUS AUREUS INFECTION 12/12/2006    Annotation: stitch abcess in lower abdomen after colon cancer resection Qualifier: Diagnosis of  By: [name] MD, [name]    . Postoperative stitch abscess 07/19/2011    Past Surgical History  Procedure Date  . Lumbar laminectomy 1977  . Wound exploration 2006    For possible stitch abscess Dr [name]  . Incise and drain abcess 2009,2010,2011,2012  . Hernia repair [phone]    DB  . Colon surgery 2000    Family History  Problem Relation Age of Onset  . Stroke Mother   . Stroke Father   . Hypertension Father   . Heart attack Father   . Stroke Sister   . Stroke Brother   . Stroke Brother    Social History:  reports that he quit smoking about 34 years ago. He quit smokeless tobacco use about 34 years ago. His smokeless tobacco use included Chew. He reports that he does not drink alcohol or use illicit drugs.  Allergies: No Known Allergies  Medications Prior to Admission  Medication Sig Dispense Refill  . aspirin 81 MG tablet Take 81 mg by mouth daily.        [name] CALCIUM PO Take by mouth daily.        . fish oil-omega-3 fatty acids 1000 MG capsule Take by mouth daily.        [name] FOLIC ACID PO Take by mouth daily.        . metoprolol tartrate (LOPRESSOR) 25 MG tablet Take 1 tablet (25 mg total) by mouth 2 (two) times daily.  60 tablet  5  . simvastatin (ZOCOR) 40 MG tablet Take 1 tablet (40 mg total) by mouth every evening.  90 tablet  2  . Thiamine HCl  (VITAMIN B-1 PO) Take by mouth daily.        [name] warfarin (COUMADIN) 5 MG tablet TAKE AS DIRECTED BY COUMADIN CLINIC  40 tablet  2  . Zolpidem Tartrate (AMBIEN PO) Take by mouth at bedtime.          No results found for this or any previous visit (from the past 48 hour(s)). No results found.  ROS  Blood pressure 116/78, pulse 68, temperature 97.4 F (36.3 C), temperature source Oral, resp. rate 16, height 5\' 10"  (1.778 m), weight 87.261 kg (192 lb 6 oz), SpO2 95.00%. Physical Exam  Constitutional: He appears well-developed.  HENT:  Head: Normocephalic.  Eyes: Pupils are equal, round, and reactive to light.  Neck: Normal range of motion.  Cardiovascular: Normal heart sounds.        irreg irreg  Respiratory: Effort normal. No respiratory distress. He has no wheezes.  GI: Soft. Bowel sounds are normal. He exhibits no distension. There is no tenderness.  Musculoskeletal:       Arms:      2cm mass posterior right arm, somewhat fixed to muscle     Assessment/Plan Mass posterior  right arm.  For removal.  Procedure, risks, benefits discussed. He agrees.  Casaundra Takacs E 03/07/2012, 12:24 PM

## 2012-03-07 NOTE — Op Note (Signed)
03/07/2012  1:30 PM  PATIENT:  Christian Maxwell  78 y.o. male  PRE-OPERATIVE DIAGNOSIS:  mass right arm  POST-OPERATIVE DIAGNOSIS:  mass right arm  PROCEDURE:  Procedure(s): EXCISION MASS RIGHT ARM 2CM WITH LAYERED CLOSURE  SURGEON:  Surgeon(s): [name], MD  PHYSICIAN ASSISTANT:   ASSISTANTS: none   ANESTHESIA:   local and general  EBL:  Total I/O In: 600 [I.V.:600] Out: -   BLOOD ADMINISTERED:none  DRAINS: none   SPECIMEN:  Excision  DISPOSITION OF SPECIMEN:  PATHOLOGY  COUNTS:  YES  DICTATION: .Dragon Dictation  Patient presents for excision of mass right posterior arm. Patient was identified in the preop holding area. Informed consent was obtained. Patient received intravenous antibiotics. His site was marked. He was brought to the operating room and general anesthesia with laryngeal mask airway was a minister by anesthesia. His right arm was prepped and draped in sterile fashion after positioning. Quarter percent Marcaine with epinephrine was injected over this palpable mass. Vertical incision was made. Subcutaneous tissues were dissected down revealing a 2 cm circular mass. This was dissected free off the underlying fascia and then freed up from surrounding subcuticular tissue with cautery. Mass was sent to pathology. Area was irrigated. Additional local was injected. Wound was then closed with deep layers tacked down to the fascia with interrupted 4-0 Vicryl. Skin was closed with running 4-0 Monocryl followed by Dermabond. Excellent hemostasis was obtained. There were no apparent complications. Patient tolerated procedure well and was taken recovery in stable condition.  PATIENT DISPOSITION:  PACU - hemodynamically stable.   Delay start of Pharmacological VTE agent (>24hrs) due to surgical blood loss or risk of bleeding:  no  Violeta Gelinas, MD, MPH, FACS Pager: [phone]  10/24/20131:30 PM

## 2012-03-07 NOTE — Transfer of Care (Signed)
Immediate Anesthesia Transfer of Care Note  Patient: Christian Maxwell  Procedure(s) Performed: Procedure(s) (LRB) with comments: EXCISION MASS (Right) - removal mass posterior right arm  Patient Location: PACU  Anesthesia Type: General  Level of Consciousness: awake  Airway & Oxygen Therapy: Patient Spontanous Breathing and Patient connected to face mask oxygen  Post-op Assessment: Report given to PACU RN and Post -op Vital signs reviewed and stable  Post vital signs: Reviewed and stable  Complications: No apparent anesthesia complications

## 2012-03-07 NOTE — Anesthesia Postprocedure Evaluation (Signed)
  Anesthesia Post-op Note  Patient: Christian Maxwell  Procedure(s) Performed: Procedure(s) (LRB) with comments: EXCISION MASS (Right) - removal mass posterior right arm  Patient Location: PACU  Anesthesia Type: General  Level of Consciousness: awake, alert  and oriented  Airway and Oxygen Therapy: Patient Spontanous Breathing  Post-op Pain: none  Post-op Assessment: Post-op Vital signs reviewed and Patient's Cardiovascular Status Stable  Post-op Vital Signs: stable  Complications: No apparent anesthesia complications

## 2012-03-07 NOTE — Anesthesia Preprocedure Evaluation (Addendum)
Anesthesia Evaluation  Patient identified by MRN, date of birth, ID band Patient awake    Reviewed: Allergy & Precautions, H&P , NPO status , Patient's Chart, lab work & pertinent test results  Airway Mallampati: II      Dental  (+) Teeth Intact and Dental Advisory Given   Pulmonary  breath sounds clear to auscultation        Cardiovascular Rhythm:Irregular Rate:Normal     Neuro/Psych    GI/Hepatic   Endo/Other    Renal/GU      Musculoskeletal   Abdominal   Peds  Hematology   Anesthesia Other Findings   Reproductive/Obstetrics                           Anesthesia Physical Anesthesia Plan  ASA: III  Anesthesia Plan: General   Post-op Pain Management:    Induction:   Airway Management Planned: LMA  Additional Equipment:   Intra-op Plan:   Post-operative Plan:   Informed Consent: I have reviewed the patients History and Physical, chart, labs and discussed the procedure including the risks, benefits and alternatives for the proposed anesthesia with the patient or authorized representative who has indicated his/her understanding and acceptance.   Dental advisory given  Plan Discussed with: CRNA and Surgeon  Anesthesia Plan Comments: (Chronic AFib EF 60% by Echo   Plan GA with LMA  Kipp Brood, MD)        Anesthesia Quick Evaluation

## 2012-03-08 ENCOUNTER — Telehealth: Payer: Self-pay | Admitting: General Surgery

## 2012-03-08 ENCOUNTER — Encounter (HOSPITAL_BASED_OUTPATIENT_CLINIC_OR_DEPARTMENT_OTHER): Payer: Self-pay | Admitting: General Surgery

## 2012-03-09 NOTE — Telephone Encounter (Signed)
Called to report pathology

## 2012-03-11 ENCOUNTER — Telehealth: Payer: Self-pay | Admitting: General Surgery

## 2012-03-11 ENCOUNTER — Ambulatory Visit (INDEPENDENT_AMBULATORY_CARE_PROVIDER_SITE_OTHER): Payer: Medicare Other | Admitting: General Surgery

## 2012-03-11 ENCOUNTER — Encounter (INDEPENDENT_AMBULATORY_CARE_PROVIDER_SITE_OTHER): Payer: Self-pay | Admitting: General Surgery

## 2012-03-11 VITALS — BP 124/62 | HR 84 | Temp 97.4°F | Resp 16 | Ht 70.0 in | Wt 194.6 lb

## 2012-03-11 DIAGNOSIS — T148XXA Other injury of unspecified body region, initial encounter: Secondary | ICD-10-CM | POA: Insufficient documentation

## 2012-03-11 NOTE — Progress Notes (Signed)
The patient had a right arm lesion excised by Dr. Violeta Gelinas. This was done 4 days ago. The wound has opened up and started to drain serosanguineous fluid. Initially drained the hematoma and now it is open. It measures approximately 3 cm long by a centimeter wide and has a shelf and the left that measures probably 2-3 cm circumferentially. I was able to easily packed a wet 4 x 4 gauze in the wound.  The wound does not appear to be infected. The only thing draining from it was serous fluid. I believe that he developed a hematoma which subsequently expresses of an open up the wound.  This should heal with conservative management which means wet-to-dry dressings at least once a day. The patient can shower with the wound and get wet with soap and water and then padded dry. He is returned to see Dr. [name] in approximately 2 weeks.

## 2012-03-11 NOTE — Telephone Encounter (Signed)
I spoke to the patient and gave him the pathology results.  His wound separated overnight Saturday with spontaneous evacuation of hematoma/seroma by history.  His wife is doing wound care and he is coming to the urgent office today for evaluation. Violeta Gelinas, MD, MPH, FACS Pager: [phone]

## 2012-03-14 ENCOUNTER — Ambulatory Visit (INDEPENDENT_AMBULATORY_CARE_PROVIDER_SITE_OTHER): Payer: Medicare Other

## 2012-03-14 DIAGNOSIS — T148XXA Other injury of unspecified body region, initial encounter: Secondary | ICD-10-CM

## 2012-03-14 NOTE — Progress Notes (Signed)
Pt is here today at his request to check abscess wound on his right arm.  The wound is uncovered and unpacked.  His wife is concerned that it may get infected and requested preventive antibiotics.  I asked our urgent office physician, Dr. Dwain Sarna, to see this patient.  He reassured him that the wound was not infected, and would heal very gradually from the inside out, and drainage was normal unless it began to look like pus. Continue packing the wound with damp saline gauze qd until next f/u appointment on 11/13 with Dr. [name].  Dr. Dwain Sarna explained in detail why he did not want to prescribe a preventive antibiotic. The patient was told to call if the wound becomes red and warm to the touch and we would see him same day in our urgent office.

## 2012-03-15 ENCOUNTER — Ambulatory Visit (INDEPENDENT_AMBULATORY_CARE_PROVIDER_SITE_OTHER): Payer: Medicare Other

## 2012-03-15 DIAGNOSIS — I4891 Unspecified atrial fibrillation: Secondary | ICD-10-CM

## 2012-03-15 LAB — POCT INR: INR: 1.4

## 2012-03-18 ENCOUNTER — Ambulatory Visit (INDEPENDENT_AMBULATORY_CARE_PROVIDER_SITE_OTHER): Payer: Medicare Other | Admitting: General Surgery

## 2012-03-18 ENCOUNTER — Encounter (INDEPENDENT_AMBULATORY_CARE_PROVIDER_SITE_OTHER): Payer: Self-pay | Admitting: General Surgery

## 2012-03-18 VITALS — BP 110/68 | HR 68 | Temp 97.0°F | Resp 16 | Ht 70.0 in | Wt 193.2 lb

## 2012-03-18 DIAGNOSIS — IMO0002 Reserved for concepts with insufficient information to code with codable children: Secondary | ICD-10-CM

## 2012-03-18 MED ORDER — OXYCODONE-ACETAMINOPHEN 5-325 MG PO TABS: 1.0000 | ORAL_TABLET | ORAL | Status: DC | PRN

## 2012-03-18 MED ORDER — CEPHALEXIN 500 MG PO CAPS: 500.0000 mg | ORAL_CAPSULE | Freq: Three times a day (TID) | ORAL | Status: AC

## 2012-03-18 NOTE — Assessment & Plan Note (Signed)
Pt with open wound. Due to the surrounding warmth and faint erythema, I will add 1 week keflex.  Follow up with Dr. [name] in 1 week as scheduled.

## 2012-03-18 NOTE — Patient Instructions (Signed)
Pack wound once daily.  Wean off oxycodone.  Discuss with primary care physician refill on cholesterol medication  Take antibiotic 3 times/day for 1 week.

## 2012-03-18 NOTE — Progress Notes (Signed)
HISTORY: Patient is a 78 year old male it is approximately a 1-1/2-2 weeks status post excision of right upper arm mass. He developed a hematoma that opened up. He's been doing packing twice daily. He is worried since his arm looked like it was a little red and is getting ready to go to the beach.    EXAM: General:  Alert and oriented x3. Incision:  1 x 2 cm x 3 cm open wound on posterior right arm. Around two thirds of a 4 x 4 inch gauze that is moist was able to be packed into the wound.   PATHOLOGY: Soft tissue mass, simple excision, Right posterior arm - BENIGN FIBROFATTY SOFT TISSUE WITH ACUTE AND CHRONIC INFLAMMATION, ABSCESS FORMATION, FAT NECROSIS AND GIANT CELL REACTION. - NO EVIDENCE OF MALIGNANCY.   ASSESSMENT AND PLAN:   Hematoma, postoperative Pt with open wound. Due to the surrounding warmth and faint erythema, I will add 1 week keflex.  Follow up with Dr. [name] in 1 week as scheduled.        Maudry Diego, MD Surgical Oncology, General & Endocrine Surgery [facility]  Johny Sax, MD [name], MD

## 2012-03-27 ENCOUNTER — Encounter (INDEPENDENT_AMBULATORY_CARE_PROVIDER_SITE_OTHER): Payer: Medicare Other | Admitting: General Surgery

## 2012-04-03 ENCOUNTER — Ambulatory Visit (INDEPENDENT_AMBULATORY_CARE_PROVIDER_SITE_OTHER): Payer: Medicare Other | Admitting: General Surgery

## 2012-04-03 ENCOUNTER — Encounter (INDEPENDENT_AMBULATORY_CARE_PROVIDER_SITE_OTHER): Payer: Self-pay | Admitting: General Surgery

## 2012-04-03 VITALS — BP 116/72 | HR 74 | Temp 97.8°F | Resp 16 | Ht 70.0 in | Wt 194.1 lb

## 2012-04-03 DIAGNOSIS — T148XXA Other injury of unspecified body region, initial encounter: Secondary | ICD-10-CM

## 2012-04-03 NOTE — Progress Notes (Signed)
Subjective:     Patient ID: Christian Maxwell, male   DOB: January 27, 1933, 78 y.o.   MRN: [id number]  HPI Patient presents status post removal of mass posterior right arm. Pathology revealed chronic cyst. He had primary dehiscence of the wound. He has been treating it with local wound care.  Review of Systems     Objective:   Physical Exam On physical exam the area is clean and healing. It is also contracting. It is only about 1 cm in size. Granulation base is red. There is no evidence of infection. Wound was cleaned and a new bandage was placed.    Assessment:     Status post excision of a chronic cyst right arm, Healing by secondary intention    Plan:     Return for followup in 3 weeks. Continue wound care.

## 2012-04-03 NOTE — Patient Instructions (Addendum)
Keep area covered with a bandage until it closes

## 2012-04-18 ENCOUNTER — Ambulatory Visit (INDEPENDENT_AMBULATORY_CARE_PROVIDER_SITE_OTHER): Payer: Medicare Other | Admitting: Cardiovascular Disease

## 2012-04-18 ENCOUNTER — Encounter: Payer: Self-pay | Admitting: Cardiovascular Disease

## 2012-04-18 VITALS — BP 110/66 | HR 62 | Ht 70.0 in | Wt 197.0 lb

## 2012-04-18 DIAGNOSIS — M940 Chondrocostal junction syndrome [Tietze]: Secondary | ICD-10-CM | POA: Diagnosis not present

## 2012-04-18 DIAGNOSIS — I4891 Unspecified atrial fibrillation: Secondary | ICD-10-CM

## 2012-04-18 NOTE — Progress Notes (Signed)
Christian Maxwell Feeback Date of Birth  01-20-1933 [facility]     [place]  [address]    [address]   [address] [place], [place]  [address]    [place], [place]  [address] [phone]  Fax  [phone]  [phone]  Fax [phone]   History of Present Illness:  Problem list: 1. Atrial fibrillation 2. Hyperlipidemia 3. Premature ventricular contractions 4. Hypothyroidism    Christian Maxwell is a 78 year old gentleman with a history of atrial fibrillation, hyperlipidemia, and a history of PVCs. He exercises on a regular basis. He has not had any episodes of chest pain or shortness of breath. He denies any syncope or presyncope.  He's doing much better. He was having some shortness of breath earlier in the year and the end of last year.  Dec. 4, 2013 :   He presents today with some right sided chest wall pain.   The discomfort increases with deep breath, right arm movement and with twisting of his torso.  No dyspnea.  No angina  Current Outpatient Prescriptions on File Prior to Visit  Medication Sig Dispense Refill  . aspirin 81 MG tablet Take 81 mg by mouth daily.        [name] CALCIUM PO Take by mouth daily.        . fish oil-omega-3 fatty acids 1000 MG capsule Take by mouth daily.        [name] FOLIC ACID PO Take by mouth daily.        . metoprolol tartrate (LOPRESSOR) 25 MG tablet Take 1 tablet (25 mg total) by mouth 2 (two) times daily.  60 tablet  5  . oxyCODONE-acetaminophen (ROXICET) 5-325 MG per tablet Take 1 tablet by mouth every 4 (four) hours as needed for pain.  30 tablet  0  . simvastatin (ZOCOR) 40 MG tablet Take 1 tablet (40 mg total) by mouth every evening.  90 tablet  2  . Thiamine HCl (VITAMIN B-1 PO) Take by mouth daily.        [name] warfarin (COUMADIN) 5 MG tablet TAKE AS DIRECTED BY COUMADIN CLINIC  40 tablet  2  . Zolpidem Tartrate (AMBIEN PO) Take by mouth at bedtime.          No Known Allergies  Past Medical History  Diagnosis Date  . Hypertension    . Hyperlipidemia   . Syncope and collapse   . Atrial fibrillation   . Dyspnea   . PVC's (premature ventricular contractions)   . Abscess 7/13    abd abscess  . MRSA (methicillin resistant Staphylococcus aureus)   . Cancer 2000    T3N0  . Normal nuclear stress test 2004  . METHICILLIN RESISTANT STAPHYLOCOCCUS AUREUS INFECTION 12/12/2006    Annotation: stitch abcess in lower abdomen after colon cancer resection Qualifier: Diagnosis of  By: [name] MD, [name]    . Postoperative stitch abscess 07/19/2011    Past Surgical History  Procedure Date  . Lumbar laminectomy 1977  . Wound exploration 2006    For possible stitch abscess Dr [name]  . Incise and drain abcess 2009,2010,2011,2012  . Hernia repair [phone]    DB  . Colon surgery 2000  . Mass excision 03/07/2012    Procedure: EXCISION MASS;  Surgeon: [name], MD;  Location: [facility];  Service: General;  Laterality: Right;  removal mass posterior right arm    History  Smoking status  . Former Smoker  . Quit date: 10/13/1977  Smokeless tobacco  . Former Neurosurgeon  . Types: Chew  . Quit date: 10/13/1977    History  Alcohol Use No    Family History  Problem Relation Age of Onset  . Stroke Mother   . Stroke Father   . Hypertension Father   . Heart attack Father   . Stroke Sister   . Stroke Brother   . Stroke Brother     Reviw of Systems:  Reviewed in the HPI.  All other systems are negative.  Physical Exam: BP 110/66  Pulse 62  Ht 5\' 10"  (1.778 m)  Wt 197 lb (89.359 kg)  BMI 28.27 kg/m2 The patient is alert and oriented x 3.  The mood and affect are normal.   Skin: warm and dry.  Color is normal.    HEENT:   Normocephalic/atraumatic. No JVD. His carotids are normal.  Lungs: Lungs are clear.   Heart: Irregularly irregular. He has no murmurs.   The right side of his chest has significant chest wall tenderness.  Compression of his rib cage on caused worsening of his rib pain. Abdomen:  Is good bowel sounds. There is no hepatomegaly  Extremities:  No clubbing cyanosis or edema.    Neuro:  Exam is nonfocal. His gait is normal.    ECG: 04/18/2012: Atrial fibrillation with controlled ventricular response. He has no ST or T wave changes.  Assessment / Plan:

## 2012-04-18 NOTE — Assessment & Plan Note (Addendum)
Christian Maxwell presents with symptoms that are c/w costoshronditis.  He has point tendernes of his lower rib on the right side just below his nipple.  This discomfort is reproduced with rib pressure, taking a deep breath, stretching, and right arm movement.   I have recommended that he take Motrin 200- 400 mg TID for 2-3 days to help with the costochondritis.    He will keep his apt. In May/June of 2014.  I'll see him sooner if needed.

## 2012-04-18 NOTE — Patient Instructions (Addendum)
Your physician wants you to follow-up in: KEEP RECALL IN April 2014 You will receive a reminder letter in the mail two months in advance. If you don't receive a letter, please call our office to schedule the follow-up appointment.  Your physician recommends that you continue on your current medications as directed. Please refer to the Current Medication list given to you today.  TAKE MOTRIN 1-2 TABLETS THREE  TIMES A DAY FOR 5-7 DAYS FOR PAIN

## 2012-04-18 NOTE — Assessment & Plan Note (Signed)
His atrial fibrillation remaines stable.

## 2012-05-01 ENCOUNTER — Encounter (INDEPENDENT_AMBULATORY_CARE_PROVIDER_SITE_OTHER): Payer: Self-pay | Admitting: General Surgery

## 2012-05-01 ENCOUNTER — Ambulatory Visit (INDEPENDENT_AMBULATORY_CARE_PROVIDER_SITE_OTHER): Payer: Medicare Other | Admitting: General Surgery

## 2012-05-01 VITALS — BP 120/82 | HR 60 | Temp 97.0°F | Resp 18 | Wt 196.0 lb

## 2012-05-01 DIAGNOSIS — T148XXA Other injury of unspecified body region, initial encounter: Secondary | ICD-10-CM

## 2012-05-01 NOTE — Progress Notes (Signed)
Subjective:     Patient ID: Christian Maxwell, male   DOB: 04-04-1933, 78 y.o.   MRN: [id number]  HPI Patient is status post excision of a chronic cyst posterior right arm. Wound is healing by secondary intention. It is much improved according to the patient.  Review of Systems     Objective:   Physical Exam What is almost completely closed. There is a central portion left open. Antibiotic ointment was applied and a bandage was placed.    Assessment:     Doing well status post excision of chronic cyst posterior right arm    Plan:     Continuing care for another week or so until the wound heals. Followup when necessary

## 2012-05-06 ENCOUNTER — Other Ambulatory Visit: Payer: Self-pay | Admitting: *Deleted

## 2012-05-06 MED ORDER — WARFARIN SODIUM 5 MG PO TABS: ORAL_TABLET | ORAL | Status: DC

## 2012-05-21 ENCOUNTER — Encounter: Payer: Self-pay | Admitting: Cardiovascular Disease

## 2012-05-21 ENCOUNTER — Ambulatory Visit (INDEPENDENT_AMBULATORY_CARE_PROVIDER_SITE_OTHER): Payer: Medicare Other | Admitting: Cardiovascular Disease

## 2012-05-21 VITALS — BP 120/60 | HR 73 | Ht 70.0 in | Wt 199.0 lb

## 2012-05-21 DIAGNOSIS — I4891 Unspecified atrial fibrillation: Secondary | ICD-10-CM | POA: Diagnosis not present

## 2012-05-21 NOTE — Progress Notes (Signed)
Christian Maxwell Date of Birth  07-06-1932 [facility]     [place]  [address]    [address]   [address] [place], [place]  [address]    [place], [place]  [address] [phone]  Fax  [phone]  [phone]  Fax [phone]   History of Present Illness:  Problem list: 1. Atrial fibrillation 2. Hyperlipidemia 3. Premature ventricular contractions 4. Hypothyroidism    Christian Maxwell is a 79 year old gentleman with a history of atrial fibrillation, hyperlipidemia, and a history of PVCs. He exercises on a regular basis. He has not had any episodes of chest pain or shortness of breath. He denies any syncope or presyncope.  He's doing much better. He was having some shortness of breath earlier in the year and the end of last year.  Dec. 4, 2013 :   He presents today with some right sided chest wall pain.   The discomfort increases with deep breath, right arm movement and with twisting of his torso.  No dyspnea.  No angina.    May 22, 2011: He's doing very well at this point. He is no longer having any episodes of chest wall pain.  He still rides a stationary bike for 30 minutes twice a day and does not have any episodes of pain.   Current Outpatient Prescriptions on File Prior to Visit  Medication Sig Dispense Refill  . aspirin 81 MG tablet Take 81 mg by mouth daily.        [name] CALCIUM PO Take by mouth daily.        . fish oil-omega-3 fatty acids 1000 MG capsule Take by mouth daily.        [name] FOLIC ACID PO Take by mouth daily.        . metoprolol tartrate (LOPRESSOR) 25 MG tablet Take 1 tablet (25 mg total) by mouth 2 (two) times daily.  60 tablet  5  . simvastatin (ZOCOR) 40 MG tablet Take 1 tablet (40 mg total) by mouth every evening.  90 tablet  2  . Thiamine HCl (VITAMIN B-1 PO) Take by mouth daily.        [name] warfarin (COUMADIN) 5 MG tablet TAKE AS DIRECTED BY COUMADIN CLINIC  40 tablet  2  . Zolpidem Tartrate (AMBIEN PO) Take by mouth at bedtime.            No Known Allergies  Past Medical History  Diagnosis Date  . Hypertension   . Hyperlipidemia   . Syncope and collapse   . Atrial fibrillation   . Dyspnea   . PVC's (premature ventricular contractions)   . Abscess 7/13    abd abscess  . MRSA (methicillin resistant Staphylococcus aureus)   . Cancer 2000    T3N0  . Normal nuclear stress test 2004  . METHICILLIN RESISTANT STAPHYLOCOCCUS AUREUS INFECTION 12/12/2006    Annotation: stitch abcess in lower abdomen after colon cancer resection Qualifier: Diagnosis of  By: [name] MD, [name]    . Postoperative stitch abscess 07/19/2011    Past Surgical History  Procedure Date  . Lumbar laminectomy 1977  . Wound exploration 2006    For possible stitch abscess Dr [name]  . Incise and drain abcess 2009,2010,2011,2012  . Hernia repair [phone]    DB  . Colon surgery 2000  . Mass excision 03/07/2012    Procedure: EXCISION MASS;  Surgeon: [name], MD;  Location: [facility];  Service: General;  Laterality: Right;  removal mass posterior right arm    History  Smoking status  . Former Smoker  . Quit date: 10/13/1977  Smokeless tobacco  . Former Neurosurgeon  . Types: Chew  . Quit date: 10/13/1977    History  Alcohol Use No    Family History  Problem Relation Age of Onset  . Stroke Mother   . Stroke Father   . Hypertension Father   . Heart attack Father   . Stroke Sister   . Stroke Brother   . Stroke Brother     Reviw of Systems:  Reviewed in the HPI.  All other systems are negative.  Physical Exam: BP 120/60  Pulse 73  Ht 5\' 10"  (1.778 m)  Wt 199 lb (90.266 kg)  BMI 28.55 kg/m2  SpO2 96% The patient is alert and oriented x 3.  The mood and affect are normal.   Skin: warm and dry.  Color is normal.    HEENT:   Normocephalic/atraumatic. No JVD. His carotids are normal.  Lungs: Lungs are clear.   Heart: Irregularly irregular. He has no murmurs.   The right side of his chest has significant  chest wall tenderness.  Compression of his rib cage on caused worsening of his rib pain. Abdomen: Is good bowel sounds. There is no hepatomegaly  Extremities:  No clubbing cyanosis or edema.    Neuro:  Exam is nonfocal. His gait is normal.    ECG: 04/18/2012: Atrial fibrillation with controlled ventricular response. He has no ST or T wave changes.  Assessment / Plan:

## 2012-05-21 NOTE — Assessment & Plan Note (Signed)
Christian Maxwell is doing much better. He's not having any further episodes of chest pain. His atrial fibrillation appears to be stable.  I have asked him to continue riding his stationary bike twice a day. I'll see him back in the office in 6 months. We'll get an EKG at that time.

## 2012-05-21 NOTE — Patient Instructions (Addendum)
Your physician wants you to follow-up in: 6 months  You will receive a reminder letter in the mail two months in advance. If you don't receive a letter, please call our office to schedule the follow-up appointment.  Your physician recommends that you continue on your current medications as directed. Please refer to the Current Medication list given to you today.  

## 2012-05-27 ENCOUNTER — Encounter: Payer: Self-pay | Admitting: Gastroenterology

## 2012-06-04 ENCOUNTER — Other Ambulatory Visit: Payer: Self-pay | Admitting: *Deleted

## 2012-06-04 MED ORDER — METOPROLOL TARTRATE 25 MG PO TABS: 25.0000 mg | ORAL_TABLET | Freq: Two times a day (BID) | ORAL | Status: DC

## 2012-06-04 NOTE — Telephone Encounter (Signed)
Fax Received. Refill Completed. Gorman Safi Chowoe (R.M.A)   

## 2012-06-24 ENCOUNTER — Ambulatory Visit (INDEPENDENT_AMBULATORY_CARE_PROVIDER_SITE_OTHER): Payer: Medicare Other | Admitting: Physician Assistant

## 2012-06-24 ENCOUNTER — Encounter: Payer: Self-pay | Admitting: Physician Assistant

## 2012-06-24 ENCOUNTER — Other Ambulatory Visit: Payer: Self-pay | Admitting: *Deleted

## 2012-06-24 VITALS — BP 96/54 | HR 64 | Ht 70.0 in | Wt 195.4 lb

## 2012-06-24 DIAGNOSIS — D126 Benign neoplasm of colon, unspecified: Secondary | ICD-10-CM | POA: Diagnosis not present

## 2012-06-24 DIAGNOSIS — Z85038 Personal history of other malignant neoplasm of large intestine: Secondary | ICD-10-CM

## 2012-06-24 DIAGNOSIS — Z86010 Personal history of colonic polyps: Secondary | ICD-10-CM

## 2012-06-24 MED ORDER — MOVIPREP 100 G PO SOLR: 1.0000 | Freq: Once | ORAL | Status: AC

## 2012-06-24 NOTE — Progress Notes (Signed)
Reviewed and agree with management. Shanyiah Conde D. Codie Hainer, M.D., FACG  

## 2012-06-24 NOTE — Progress Notes (Signed)
Subjective:    Patient ID: Christian Maxwell, male    DOB: 1932-08-05, 79 y.o.   MRN: [id number]  HPI  Christian Maxwell is a very nice 79 year old of white male known to Dr. Arlyce Dice from prior colonoscopies. Patient was diagnosed with a stage I colon cancer in 2000, this was a T3 N0 lesion and he underwent segmental colectomy. His last colonoscopy was done in January of 2009 he had one 10 mm polyp removed and this was an adenoma .He comes in today to discuss followup colonoscopy. He says he has been doing very well and has absolutely no GI complaints. He is on chronic Coumadin for atrial fibrillation, and is followed by Dr. Melburn Popper. He also has history of hyperlipidemia, and hypothyroidism. Last echo showed an EF of 55-60%    Review of Systems  Constitutional: Negative.   HENT: Negative.   Eyes: Negative.   Respiratory: Negative.   Cardiovascular: Negative.   Gastrointestinal: Negative.   Endocrine: Negative.   Genitourinary: Negative.   Musculoskeletal: Negative.   Neurological: Negative.   Hematological: Negative.   Psychiatric/Behavioral: Negative.    Outpatient Prescriptions Prior to Visit  Medication Sig Dispense Refill  . aspirin 81 MG tablet Take 81 mg by mouth daily.        [name] CALCIUM PO Take by mouth daily.        . fish oil-omega-3 fatty acids 1000 MG capsule Take by mouth daily.        [name] FOLIC ACID PO Take by mouth daily.        . metoprolol tartrate (LOPRESSOR) 25 MG tablet Take 1 tablet (25 mg total) by mouth 2 (two) times daily.  60 tablet  5  . simvastatin (ZOCOR) 40 MG tablet Take 1 tablet (40 mg total) by mouth every evening.  90 tablet  2  . Thiamine HCl (VITAMIN B-1 PO) Take by mouth daily.        [name] warfarin (COUMADIN) 5 MG tablet TAKE AS DIRECTED BY COUMADIN CLINIC  40 tablet  2  . Zolpidem Tartrate (AMBIEN PO) Take by mouth at bedtime.         No facility-administered medications prior to visit.   No Known Allergies Patient Active Problem List  Diagnosis  . INSOMNIA,  MILD  . INJURY, SUPERFICIAL, HEAD NEC W/O INFECTION  . Atrial fibrillation  . Hyperlipidemia  . Chest pain  . Dizziness  . Dyspnea  . Hematoma, postoperative  . Postoperative stitch abscess  . Open wound  . Costochondritis  . H/O colon cancer, stage I  . Adenomatous colon polyp   History  Substance Use Topics  . Smoking status: Former Smoker    Quit date: 10/13/1977  . Smokeless tobacco: Former Neurosurgeon    Types: Chew    Quit date: 10/13/1977  . Alcohol Use: No   family history includes Diabetes in his brother; Heart attack in his father; Heart disease in his brother; Hypertension in his father; and Stroke in his brothers, father, mother, and sister.     Objective:   Physical Exam well-developed elderly white male in no acute distress, very pleasant blood pressure 96/54 pulse 64 height 5 foot 10 weight 195. HEENT; traumatic normocephalic EOMI PERRLA sclera anicteric,Neck; Supple no JVD, Cardiovascular; regular rate and rhythm with S1-S2 no murmur or gallop, Pulmonary; clear bilaterally, Abdomen; soft nontender nondistended bowel sounds are active he has a low midline incisional scar no palpable mass or hepatosplenomegaly, Rectal ;exam not done, Extremities; no clubbing cyanosis or edema skin  warm and dry, Psych; mood and affect normal and appropriate        Assessment & Plan:  #32 79 year old male with history of stage I colon cancer diagnosed 2000 and status post sigmoid colectomy.-Due for followup colonoscopy #2 history of adenomatous colon polyps #3 chronic anticoagulation with Coumadin #4 atrial fibrillation #5 hyperlipidemia  Plan patient is scheduled for colonoscopy with Dr. Arlyce Dice, procedure was discussed in detail with the patient and he is agreeable to proceed. He will need to  come  off of his Coumadin for 5 days prior to the procedure, we discussed relative risks of stopping anticoagulation short-term. We will obtain consent from Dr. Melburn Popper, his cardiologist.

## 2012-06-24 NOTE — Patient Instructions (Addendum)
We sent a prescription to [facility] for the Moviprep.  You have been scheduled for a colonoscopy with propofol. Please follow written instructions given to you at your visit today.  Please pick up your prep kit at the pharmacy within the next 1-3 days. If you use inhalers (even only as needed) or a CPAP machine, please bring them with you on the day of your procedure.

## 2012-06-25 ENCOUNTER — Telehealth: Payer: Self-pay | Admitting: *Deleted

## 2012-06-25 NOTE — Telephone Encounter (Signed)
Dr. Elease Hashimoto got back to me, responded to my staff message. The patient can come off his Coumadin 5 days prior to his colonoscopy. He may restart it the day after the procedure.

## 2012-07-01 ENCOUNTER — Telehealth (INDEPENDENT_AMBULATORY_CARE_PROVIDER_SITE_OTHER): Payer: Self-pay | Admitting: General Surgery

## 2012-07-01 ENCOUNTER — Telehealth: Payer: Self-pay | Admitting: *Deleted

## 2012-07-01 NOTE — Telephone Encounter (Signed)
Pt called for appt, but was redirected to call his PCP.  His complaint was back pain, especially when he bends over.  Pt states he noted this upon waking today.  He will call his PCP to evaluate his back pain.

## 2012-07-01 NOTE — Telephone Encounter (Signed)
Called pt to advise him he can stop his Coumadin on 07-18-2012 and resume it on 3-12 -2014.   Colonoscopy scheduled for 07-23-2012.

## 2012-07-17 ENCOUNTER — Encounter (INDEPENDENT_AMBULATORY_CARE_PROVIDER_SITE_OTHER): Payer: Self-pay | Admitting: General Surgery

## 2012-07-17 ENCOUNTER — Ambulatory Visit (INDEPENDENT_AMBULATORY_CARE_PROVIDER_SITE_OTHER): Payer: Medicare Other | Admitting: General Surgery

## 2012-07-17 VITALS — BP 136/84 | HR 74 | Temp 97.8°F | Resp 18 | Ht 70.0 in | Wt 195.0 lb

## 2012-07-17 DIAGNOSIS — R300 Dysuria: Secondary | ICD-10-CM | POA: Diagnosis not present

## 2012-07-17 NOTE — Progress Notes (Signed)
Subjective:     Patient ID: Christian Maxwell, male   DOB: April 06, 1933, 79 y.o.   MRN: [id number]  HPI Patient is well known to me status post excision of recurrent stitch abscess lower midline. He returns complaining of a mass underneath the most recent excision. He was initially somewhat tender but the pain is resolved. It has had no drainage or redness. Of note he mentions frequent urination recently. He's had no fevers. No burning.  Review of Systems     Objective:   Physical Exam  Constitutional: He appears well-developed and well-nourished.  HENT:  Head: Normocephalic.  Pulmonary/Chest: Effort normal. No respiratory distress.  Genitourinary: Penis normal.   Abdomen is soft and nontender. Previous stitch granuloma excision site over his symphysis pubis has some underlying palpable scar tissue. There is no erythema. No cellulitis. No drainage. There is no tenderness on palpating this small area of scar tissue. No other evidence of infection along the abdominal wall.    Assessment:     Scar tissue after excision of abdominal wall stitch abscess without evidence of recurrent infection for a sheath, dysuria    Plan:     We'll check urinalysis with micro-. We will call him with these results. Of note he mentions that he is having a colonoscopy next week. It will be best to address his urine issues prior to that. He will let us know if there any changes in the scar tissue over his symphysis pubis.

## 2012-07-17 NOTE — Patient Instructions (Addendum)
We will call regarding your urinalysis

## 2012-07-18 ENCOUNTER — Telehealth (INDEPENDENT_AMBULATORY_CARE_PROVIDER_SITE_OTHER): Payer: Self-pay

## 2012-07-18 LAB — URINALYSIS, ROUTINE W REFLEX MICROSCOPIC
Glucose, UA: NEGATIVE mg/dL
Leukocytes, UA: NEGATIVE
Nitrite: NEGATIVE
Protein, ur: NEGATIVE mg/dL
Urobilinogen, UA: 0.2 mg/dL (ref 0.0–1.0)

## 2012-07-18 NOTE — Telephone Encounter (Signed)
I called and spoke to the wife and let her know the ua was ok.  She will tell the pt.

## 2012-07-18 NOTE — Telephone Encounter (Signed)
Message copied by Ivory Broad on Thu Jul 18, 2012  9:30 AM ------      Message from: [name]      Created: Thu Jul 18, 2012  8:10 AM       Please call Alvester and tell him his U/A is OK      thx ------

## 2012-07-23 ENCOUNTER — Encounter: Payer: Medicare Other | Admitting: Gastroenterology

## 2012-08-07 DIAGNOSIS — C189 Malignant neoplasm of colon, unspecified: Secondary | ICD-10-CM | POA: Diagnosis not present

## 2012-08-07 DIAGNOSIS — I4891 Unspecified atrial fibrillation: Secondary | ICD-10-CM | POA: Diagnosis not present

## 2012-08-07 DIAGNOSIS — R413 Other amnesia: Secondary | ICD-10-CM | POA: Diagnosis not present

## 2012-08-07 DIAGNOSIS — E0500 Thyrotoxicosis with diffuse goiter without thyrotoxic crisis or storm: Secondary | ICD-10-CM | POA: Diagnosis not present

## 2012-08-07 DIAGNOSIS — M542 Cervicalgia: Secondary | ICD-10-CM | POA: Diagnosis not present

## 2012-08-07 DIAGNOSIS — E785 Hyperlipidemia, unspecified: Secondary | ICD-10-CM | POA: Diagnosis not present

## 2012-08-08 ENCOUNTER — Encounter: Payer: Medicare Other | Admitting: Gastroenterology

## 2012-08-14 ENCOUNTER — Encounter (INDEPENDENT_AMBULATORY_CARE_PROVIDER_SITE_OTHER): Payer: Self-pay | Admitting: General Surgery

## 2012-08-14 ENCOUNTER — Ambulatory Visit (INDEPENDENT_AMBULATORY_CARE_PROVIDER_SITE_OTHER): Payer: Medicare Other | Admitting: General Surgery

## 2012-08-14 ENCOUNTER — Other Ambulatory Visit (INDEPENDENT_AMBULATORY_CARE_PROVIDER_SITE_OTHER): Payer: Self-pay | Admitting: General Surgery

## 2012-08-14 VITALS — BP 110/66 | HR 72 | Temp 97.5°F | Resp 16 | Ht 70.0 in | Wt 194.2 lb

## 2012-08-14 DIAGNOSIS — R1900 Intra-abdominal and pelvic swelling, mass and lump, unspecified site: Secondary | ICD-10-CM | POA: Diagnosis not present

## 2012-08-14 DIAGNOSIS — R222 Localized swelling, mass and lump, trunk: Secondary | ICD-10-CM

## 2012-08-14 DIAGNOSIS — IMO0002 Reserved for concepts with insufficient information to code with codable children: Secondary | ICD-10-CM | POA: Diagnosis not present

## 2012-08-14 DIAGNOSIS — T8140XA Infection following a procedure, unspecified, initial encounter: Secondary | ICD-10-CM | POA: Diagnosis not present

## 2012-08-14 LAB — BUN: BUN: 19 mg/dL (ref 6–23)

## 2012-08-14 NOTE — Progress Notes (Signed)
Subjective:     Patient ID: Christian Maxwell, male   DOB: [birth year], 79 y.o.   MRN: [id number]  HPI Patient presents for followup. He has history of multiple stitch granulomas and stitch abscesses from lower midline incision in the past. He developed soft tissue swelling over his pubic tubercle and presents for followup. It seems to get larger after doing heavy activity. No change in bowel or bladder habits.  Review of Systems     Objective:   Physical Exam  Constitutional: He is oriented to person, place, and time.  Neck: Normal range of motion.  Cardiovascular: Normal rate and regular rhythm.   Pulmonary/Chest: Effort normal. No respiratory distress.  Abdominal: Soft. He exhibits no distension. There is no tenderness. There is no rebound.    Mass over pubic tubercle  Neurological: He is alert and oriented to person, place, and time.       Assessment:     History multiple abdominal wall stitch abscesses now with mass over pubic tubercle    Plan:     To evaluate this further with a CT of the pelvis with intravenous contrast. He had lab work done in Dr. Meriam Sprague office last week. We will arrange CT pelvis with IV contrast.  I willl call him with the results. We will plan accordingly.

## 2012-08-16 ENCOUNTER — Ambulatory Visit
Admission: RE | Admit: 2012-08-16 | Discharge: 2012-08-16 | Disposition: A | Discharge status: Home / Self Care | Payer: Medicare Other | Source: Ambulatory Visit | Attending: General Surgery | Admitting: General Surgery

## 2012-08-16 DIAGNOSIS — N4829 Other inflammatory disorders of penis: Secondary | ICD-10-CM | POA: Diagnosis not present

## 2012-08-16 MED ORDER — IOHEXOL 300 MG/ML  SOLN
100.0000 mL | Freq: Once | INTRAMUSCULAR | Status: AC | PRN
  Administered 2012-08-16: 100 mL via INTRAVENOUS

## 2012-08-20 ENCOUNTER — Telehealth (INDEPENDENT_AMBULATORY_CARE_PROVIDER_SITE_OTHER): Payer: Self-pay | Admitting: General Surgery

## 2012-08-20 NOTE — Telephone Encounter (Signed)
Pt called and would like to speak with Dr. [name] to discuss results of CT and plan of care.  He can be reached at home tomorrow morning or on his cell phone otherwise.

## 2012-08-21 ENCOUNTER — Ambulatory Visit (INDEPENDENT_AMBULATORY_CARE_PROVIDER_SITE_OTHER): Payer: Medicare Other | Admitting: General Surgery

## 2012-08-21 ENCOUNTER — Encounter (INDEPENDENT_AMBULATORY_CARE_PROVIDER_SITE_OTHER): Payer: Self-pay | Admitting: General Surgery

## 2012-08-21 VITALS — BP 120/82 | HR 72 | Temp 97.0°F | Resp 20 | Wt 190.5 lb

## 2012-08-21 DIAGNOSIS — L03319 Cellulitis of trunk, unspecified: Secondary | ICD-10-CM

## 2012-08-21 DIAGNOSIS — L02219 Cutaneous abscess of trunk, unspecified: Secondary | ICD-10-CM | POA: Insufficient documentation

## 2012-08-21 MED ORDER — DOXYCYCLINE HYCLATE 50 MG PO CAPS: 100.0000 mg | ORAL_CAPSULE | Freq: Two times a day (BID) | ORAL | Status: DC

## 2012-08-21 MED ORDER — OXYCODONE HCL 5 MG PO TABS: 5.0000 mg | ORAL_TABLET | Freq: Three times a day (TID) | ORAL | Status: DC | PRN

## 2012-08-21 NOTE — Patient Instructions (Signed)
Change gauze daily. Leave packing taping until Saturday. Remove at bedtime. Continue to change gauze daily. I will let you know when the culture comes back.

## 2012-08-21 NOTE — Progress Notes (Signed)
Subjective:     Patient ID: ERAGON HAMMOND, male   DOB: [birth year], 79 y.o.   MRN: [id number]  HPI Patient underwent CT scan. This demonstrated the mass to in the suprapubic region was a fluid collection versus abscess. I called him and asked him to return for further evaluation.  Review of Systems     Objective:   Physical Exam Mass in suprapubic region is unchanged. Procedure note: The area was prepped in a sterile fashion. Local anesthetic was injected. 18-gauge needle was inserted. This withdrew purulent fluid. It was sent for culture. In light of that finding, I proceeded with incision and drainage of the area. Transverse incision was made. Subcutaneous tissues were dissected down into the pocket. This was cleaned out and packed with quarter-inch form gauze. Dry gauze dressing was placed on top. He tolerated this well.    Assessment:     Suprapubic abscess with history of previous stitch abscesses    Plan:    Doxycycline. He has tolerated this multiple times in the past while on Coumadin. I also gave her a prescription for oxycodone for pain control. We will check his cultures and notify him if any changes in antibiotics are needed. I gave him with instructions and I will see him back next week. I also, per his request, spoke with his wife on the phone regarding his disease process and the plan of care. I raised the possibility of seeing infectious disease again, he previously saw Dr. [name].  She was not interested in that. We will see how things go.

## 2012-08-23 LAB — WOUND CULTURE

## 2012-08-28 ENCOUNTER — Encounter (INDEPENDENT_AMBULATORY_CARE_PROVIDER_SITE_OTHER): Payer: Self-pay | Admitting: General Surgery

## 2012-08-28 ENCOUNTER — Ambulatory Visit (INDEPENDENT_AMBULATORY_CARE_PROVIDER_SITE_OTHER): Payer: Medicare Other | Admitting: General Surgery

## 2012-08-28 VITALS — BP 120/82 | HR 84 | Temp 96.8°F | Resp 12 | Wt 191.0 lb

## 2012-08-28 DIAGNOSIS — L02219 Cutaneous abscess of trunk, unspecified: Secondary | ICD-10-CM

## 2012-08-28 NOTE — Progress Notes (Signed)
Subjective:     Patient ID: Christian Maxwell, male   DOB: 07-04-1932, 79 y.o.   MRN: [id number]  HPI Patient underwent incision and drainage of suprapubic abscess. Cultures have been negative. He is taking doxycycline.  Review of Systems     Objective:   Physical Exam The area has healed. There is no overlying cellulitis. There is still some palpable scar tissue/fullness in the suprapubic region that is smaller than previously    Assessment:     Status post incision and drainage of suprapubic abscess    Plan:     Complete doxycycline course. Cultures are negative. I will see him back in a few weeks to make sure this area continues to resolve. I spoke to him and his wife regarding his history of intermittent subcutaneous abscesses. He did not want to go back and see infectious disease at this time. We will see how he does.

## 2012-09-02 ENCOUNTER — Encounter (INDEPENDENT_AMBULATORY_CARE_PROVIDER_SITE_OTHER): Payer: Self-pay | Admitting: General Surgery

## 2012-09-02 ENCOUNTER — Ambulatory Visit (INDEPENDENT_AMBULATORY_CARE_PROVIDER_SITE_OTHER): Payer: Medicare Other | Admitting: General Surgery

## 2012-09-02 VITALS — BP 110/62 | HR 84 | Temp 96.2°F | Resp 18 | Ht 70.0 in | Wt 187.6 lb

## 2012-09-02 DIAGNOSIS — L02219 Cutaneous abscess of trunk, unspecified: Secondary | ICD-10-CM

## 2012-09-02 NOTE — Progress Notes (Signed)
Subjective:     Patient ID: Christian Maxwell, male   DOB: 25-Oct-1932, 79 y.o.   MRN: [id number]  HPI The patient is a 79 year old white male who is a patient of Dr. Carollee Massed. He recently had an incision and drainage of a suprapubic abscess. He was placed on doxycycline. The patient states that the size of the area has continued to go down but he has recently noticed over the last day or 2 some bleeding from the opening in the skin. He denies any fevers or chills.  Review of Systems  Constitutional: Negative.   HENT: Negative.   Eyes: Negative.   Respiratory: Negative.   Cardiovascular: Negative.   Gastrointestinal: Negative.   Endocrine: Negative.   Genitourinary: Negative.   Musculoskeletal: Negative.   Skin: Positive for wound.  Allergic/Immunologic: Negative.   Neurological: Negative.   Hematological: Bruises/bleeds easily.  Psychiatric/Behavioral: Negative.        Objective:   Physical Exam  Constitutional: He is oriented to person, place, and time. He appears well-developed and well-nourished.  HENT:  Head: Normocephalic and atraumatic.  Eyes: Conjunctivae and EOM are normal. Pupils are equal, round, and reactive to light.  Neck: Normal range of motion. Neck supple.  Cardiovascular: Normal rate, regular rhythm and normal heart sounds.   Pulmonary/Chest: Effort normal and breath sounds normal.  Abdominal: Bowel sounds are normal.  There is an area of induration in the suprapubic area with a small opening in the skin. There is some watery-appearing bloody drainage coming from the area. There is no palpable fluctuance. This area was probed with a Q-tip and there is no significant cavity. Pressure was held on the area for several minutes and then the area was packed with gauze and a dressing was applied  Musculoskeletal: Normal range of motion.  Neurological: He is alert and oriented to person, place, and time.  Skin: Skin is warm and dry.  Psychiatric: He has a normal mood and  affect. His behavior is normal.       Assessment:     The patient is status post incision and drainage of a suprapubic abscess. He now has some bleeding from the area. He is on Coumadin and we have no recent check of his INR.     Plan:     I have packed the wound today. He remove the packing tomorrow and start showering. He go tomorrow to see cardiologist office to have his INR checked. He will followup next week with Dr. [name]

## 2012-09-02 NOTE — Patient Instructions (Signed)
Remove dressing on Tuesday and shower. Keep area clean and covered with gauze

## 2012-09-03 ENCOUNTER — Ambulatory Visit (INDEPENDENT_AMBULATORY_CARE_PROVIDER_SITE_OTHER): Payer: Medicare Other | Admitting: *Deleted

## 2012-09-03 DIAGNOSIS — I4891 Unspecified atrial fibrillation: Secondary | ICD-10-CM

## 2012-09-03 LAB — POCT INR: INR: 2.5

## 2012-09-04 ENCOUNTER — Telehealth (INDEPENDENT_AMBULATORY_CARE_PROVIDER_SITE_OTHER): Payer: Self-pay

## 2012-09-04 ENCOUNTER — Telehealth (INDEPENDENT_AMBULATORY_CARE_PROVIDER_SITE_OTHER): Payer: Self-pay | Admitting: General Surgery

## 2012-09-04 NOTE — Telephone Encounter (Signed)
Pt called to see if request for antibiotic was approved. Per note in epic today pt advised Dr [name] declined refill of antibiotic. Pt advised to monitor for fever,redness or other changes in wound. Pt to call if any wound changes and to keep appt next week with Dr [name].

## 2012-09-04 NOTE — Telephone Encounter (Signed)
Request for refill of doxycycline received via fax. Denied per Dr [name] and faxed back to [facility] at [phone]. Confirmation received.

## 2012-09-05 ENCOUNTER — Ambulatory Visit: Payer: Medicare Other | Admitting: Family Medicine

## 2012-09-06 ENCOUNTER — Encounter (INDEPENDENT_AMBULATORY_CARE_PROVIDER_SITE_OTHER): Payer: Medicare Other | Admitting: Surgery

## 2012-09-11 ENCOUNTER — Encounter (INDEPENDENT_AMBULATORY_CARE_PROVIDER_SITE_OTHER): Payer: Self-pay | Admitting: General Surgery

## 2012-09-11 ENCOUNTER — Telehealth: Payer: Self-pay | Admitting: *Deleted

## 2012-09-11 ENCOUNTER — Ambulatory Visit (INDEPENDENT_AMBULATORY_CARE_PROVIDER_SITE_OTHER): Payer: Medicare Other | Admitting: General Surgery

## 2012-09-11 VITALS — BP 120/82 | HR 60 | Temp 97.0°F | Wt 185.0 lb

## 2012-09-11 DIAGNOSIS — L02219 Cutaneous abscess of trunk, unspecified: Secondary | ICD-10-CM

## 2012-09-11 DIAGNOSIS — L03319 Cellulitis of trunk, unspecified: Secondary | ICD-10-CM | POA: Diagnosis not present

## 2012-09-11 MED ORDER — ZOLPIDEM TARTRATE 10 MG PO TABS: 10.0000 mg | ORAL_TABLET | Freq: Every evening | ORAL | Status: DC | PRN

## 2012-09-11 MED ORDER — CLINDAMYCIN HCL 150 MG PO CAPS: 300.0000 mg | ORAL_CAPSULE | Freq: Three times a day (TID) | ORAL | Status: AC

## 2012-09-11 MED ORDER — OXYCODONE HCL 5 MG PO TABS: 5.0000 mg | ORAL_TABLET | Freq: Four times a day (QID) | ORAL | Status: DC | PRN

## 2012-09-11 MED ORDER — CLINDAMYCIN HCL 150 MG PO CAPS: 150.0000 mg | ORAL_CAPSULE | Freq: Four times a day (QID) | ORAL | Status: DC

## 2012-09-11 NOTE — Progress Notes (Signed)
Subjective:     Patient ID: Christian Maxwell, male   DOB: Jun 29, 1932, 79 y.o.   MRN: [id number]  HPI I have been following him for suprapubic abscess. He completed his course of antibiotics. Previous cultures were negative. After the antibiotic stopped, he developed some redness in the area. He has been having mild drainage.  Review of Systems     Objective:   Physical Exam  HENT:  Head: Normocephalic and atraumatic.  Eyes: EOM are normal. Pupils are equal, round, and reactive to light.  Neck: Normal range of motion. Neck supple. No tracheal deviation present.  Cardiovascular:  Irregularly irregular  Pulmonary/Chest: Effort normal and breath sounds normal. No stridor. No respiratory distress. He has no wheezes.  Abdominal: Soft. He exhibits no distension. There is no rebound.    Suprapubic erythema and fullness with bloody purulent drainage from previous I&D site   Procedure: The area was prepped in a sterile fashion. Lidocaine was injected. The previous I&D site was opened again with the scalpel and probed. Large volume of bloody purulent fluid was removed. This was sent for culture. Wound was irrigated out and packed with iodoform. A dressing was placed. Hemostasis obtained with pressure.    Assessment:     Recurrent suprapubic abscess    Plan:     Incision and drainage as above, return in 2 days to see my partner for packing change, start clindamycin with plan for 14 days, we'll followup cultures. I also referred him back to Dr. [name] from infectious disease. In addition, given her prescription for oxycodone for pain medication. He also requested a refill on his Ambien. His primary care usually provides that. I agree given 30 days and then he will return to his primary for refill.

## 2012-09-11 NOTE — Telephone Encounter (Signed)
Ezra Sites RN, from [employer] called to ask about patient who cancelled his colonoscopy scheduled for 07-23-2012.  Dr. Elease Hashimoto, [facility], gave [place] clearance for the Coumadin, 5 days prior to the procedure( the patient can hold the Coumadin.)   The patient rescheduled the colonoscopy to 09-20-2012.  Olegario Messier asked if the patient can use the same Coumadin instructions.  Per Amy Esterwood PA-C, the patient can hold the Coumadin this time also for 5 days prior to the procedure date as long as there were no changes in the patients health regarding cardic , blood,  health issues.  The patient had a suprabic abscess which he had taken care of.  Ezra Sites RN informed of this.

## 2012-09-12 ENCOUNTER — Telehealth (INDEPENDENT_AMBULATORY_CARE_PROVIDER_SITE_OTHER): Payer: Self-pay

## 2012-09-12 ENCOUNTER — Ambulatory Visit (AMBULATORY_SURGERY_CENTER): Payer: Medicare Other | Admitting: *Deleted

## 2012-09-12 VITALS — Ht 70.0 in | Wt 191.4 lb

## 2012-09-12 DIAGNOSIS — Z86010 Personal history of colonic polyps: Secondary | ICD-10-CM

## 2012-09-12 DIAGNOSIS — Z85038 Personal history of other malignant neoplasm of large intestine: Secondary | ICD-10-CM

## 2012-09-12 MED ORDER — NA SULFATE-K SULFATE-MG SULF 17.5-3.13-1.6 GM/177ML PO SOLN: 1.0000 | Freq: Once | ORAL | Status: DC

## 2012-09-12 NOTE — Progress Notes (Signed)
No egg or soy allergy. ewm  No problems with sedation in the past. ewm 

## 2012-09-12 NOTE — Telephone Encounter (Signed)
Pt's appt with Dr. [name] cancelled and he has been rescheduled with Dr. Algis Liming on 10/02/12 at 2:00pm.  Dr. [name] had an emergency and will be unavailable.  Pt has been notified.

## 2012-09-13 ENCOUNTER — Encounter (INDEPENDENT_AMBULATORY_CARE_PROVIDER_SITE_OTHER): Payer: Self-pay | Admitting: General Surgery

## 2012-09-13 ENCOUNTER — Ambulatory Visit: Payer: Medicare Other | Admitting: Infectious Diseases

## 2012-09-13 ENCOUNTER — Ambulatory Visit (INDEPENDENT_AMBULATORY_CARE_PROVIDER_SITE_OTHER): Payer: Medicare Other | Admitting: General Surgery

## 2012-09-13 VITALS — BP 110/70 | HR 78 | Temp 97.0°F | Resp 18 | Wt 189.4 lb

## 2012-09-13 DIAGNOSIS — L0291 Cutaneous abscess, unspecified: Secondary | ICD-10-CM

## 2012-09-13 DIAGNOSIS — L0390 Cellulitis, unspecified: Secondary | ICD-10-CM | POA: Diagnosis not present

## 2012-09-13 NOTE — Progress Notes (Signed)
Subjective:     Patient ID: Christian Maxwell, male   DOB: [birth year], 79 y.o.   MRN: [id number]  HPI This patient has been followed by Dr. [name] for evaluation of a suprapubic skin abscess which seems to be recurrent. He recently had this opened up and drained again and remains on antibiotics although he has not taken antibiotics for the last one to 2 days. He says he feels much better and denies any fevers or chills or redness.  He is not doing any wound packing  Review of Systems     Objective:   Physical Exam No acute distress and nontoxic-appearing He has a small suprapubic incision and some surrounding induration but there is no erythema and minimal tenderness. I was able to express some purulent material from this incision and I repacked the incision with 1/4 inch Nu Gauze    Assessment:     Recurrent suprapubic abscess-improving This abscess is currently draining and I repacked it today. I recommended that he continue the antibiotics as he was already prescribed and I recommended that he packed this wound with the new gauze ribbon in order to prevent the skin from healing of to allow continued drainage. He has an appointment with infectious disease pending and he will followup with Dr. [name] next week as already scheduled      Plan:     Daily wound packing Continue antibiotics ID followup Followup with surgery next week

## 2012-09-18 ENCOUNTER — Ambulatory Visit (INDEPENDENT_AMBULATORY_CARE_PROVIDER_SITE_OTHER): Payer: Medicare Other | Admitting: General Surgery

## 2012-09-18 ENCOUNTER — Telehealth: Payer: Self-pay | Admitting: Gastroenterology

## 2012-09-18 ENCOUNTER — Encounter (INDEPENDENT_AMBULATORY_CARE_PROVIDER_SITE_OTHER): Payer: Self-pay | Admitting: General Surgery

## 2012-09-18 ENCOUNTER — Encounter (HOSPITAL_BASED_OUTPATIENT_CLINIC_OR_DEPARTMENT_OTHER): Payer: Self-pay | Admitting: *Deleted

## 2012-09-18 VITALS — BP 126/60 | HR 64 | Temp 97.4°F | Resp 16 | Ht 70.0 in | Wt 188.0 lb

## 2012-09-18 DIAGNOSIS — L02219 Cutaneous abscess of trunk, unspecified: Secondary | ICD-10-CM

## 2012-09-18 NOTE — Patient Instructions (Signed)
Stop your Coumadin today

## 2012-09-18 NOTE — Progress Notes (Signed)
Patient ID: Christian Maxwell, male   DOB: 10/26/1932, 79 y.o.   MRN: [id number]  Chief Complaint  Patient presents with  . Follow-up    f/u suprapubic abscess    HPI Christian Maxwell is a 79 y.o. male.  CC: ongoing suprapubic abscess HPI  Patient presents for F/U suprapubic abscess.  Redness has abated on clinda but drainage persists.He removed the packing prior to this visit.  Past Medical History  Diagnosis Date  . Hypertension   . Hyperlipidemia   . Syncope and collapse   . Atrial fibrillation   . Dyspnea   . PVC's (premature ventricular contractions)   . Abscess 7/13    abd abscess  . MRSA (methicillin resistant Staphylococcus aureus)   . Colon cancer 2000    T3N0  . Normal nuclear stress test 2004  . METHICILLIN RESISTANT STAPHYLOCOCCUS AUREUS INFECTION 12/12/2006    Annotation: stitch abcess in lower abdomen after colon cancer resection Qualifier: Diagnosis of  By: Hatcher MD, Jeffrey    . Postoperative stitch abscess 07/19/2011  . Internal hemorrhoids   . Hx of adenomatous colonic polyps     Past Surgical History  Procedure Laterality Date  . Lumbar laminectomy  1977  . Wound exploration  2006    For possible stitch abscess Dr Abshir Paolini  . Incise and drain abcess  2009,2010,2011,2012  . Hernia repair  09/2002    DB  . Colon surgery  2000  . Mass excision  03/07/2012    Procedure: EXCISION MASS;  Surgeon: Chalyn Amescua E Taitum Alms, MD;  Location: [facility];  Service: General;  Laterality: Right;  removal mass posterior right arm  . Polypectomy    . Colonoscopy      Family History  Problem Relation Age of Onset  . Stroke Mother   . Stroke Father   . Hypertension Father   . Heart attack Father   . Stroke Sister   . Stroke Brother   . Stroke Brother   . Diabetes Brother   . Heart disease Brother   . Colon cancer Neg Hx     Social History History  Substance Use Topics  . Smoking status: Former Smoker    Quit date: 10/13/1977  . Smokeless tobacco:  Former User    Types: Chew    Quit date: 10/13/1977  . Alcohol Use: No    No Known Allergies  Current Outpatient Prescriptions  Medication Sig Dispense Refill  . aspirin 81 MG tablet Take 81 mg by mouth daily. 2 po daily      . CALCIUM PO Take by mouth daily.        . clindamycin (CLEOCIN) 150 MG capsule Take 2 capsules (300 mg total) by mouth 3 (three) times daily.  56 capsule  0  . fish oil-omega-3 fatty acids 1000 MG capsule Take by mouth daily.        . FOLIC ACID PO Take by mouth daily.        . metoprolol tartrate (LOPRESSOR) 25 MG tablet Take 1 tablet (25 mg total) by mouth 2 (two) times daily.  60 tablet  5  . Na Sulfate-K Sulfate-Mg Sulf (SUPREP BOWEL PREP) SOLN Take 1 kit by mouth once. suprep as directed. No substitutions  354 mL  0  . oxyCODONE (ROXICODONE) 5 MG immediate release tablet Take 1 tablet (5 mg total) by mouth every 6 (six) hours as needed for pain.  30 tablet  0  . simvastatin (ZOCOR) 40 MG tablet Take 1   tablet (40 mg total) by mouth every evening.  90 tablet  2  . Thiamine HCl (VITAMIN B-1 PO) Take by mouth daily.        . warfarin (COUMADIN) 5 MG tablet TAKE AS DIRECTED BY COUMADIN CLINIC  40 tablet  2  . zolpidem (AMBIEN) 10 MG tablet Take 1 tablet (10 mg total) by mouth at bedtime as needed for sleep.  30 tablet  0   No current facility-administered medications for this visit.    Review of Systems Review of Systems  Blood pressure 126/60, pulse 64, temperature 97.4 F (36.3 C), temperature source Temporal, resp. rate 16, height 5' 10" (1.778 m), weight 188 lb (85.276 kg).  Physical Exam Physical Exam  Constitutional: He is oriented to person, place, and time. He appears well-developed and well-nourished.  HENT:  Head: Normocephalic.  Eyes: Pupils are equal, round, and reactive to light.  Cardiovascular: Normal rate and regular rhythm.   Pulmonary/Chest: Effort normal and breath sounds normal. No respiratory distress. He has no wheezes.  Abdominal:  Soft. He exhibits no distension. There is no tenderness.    No generalized tenderness, suprapubic area has decreased erythema, less but persistent fluctuance, opening probing and 10 cc of bloody purulent fluid was evacuated, plain new gauze was placed as packing  Genitourinary: Penis normal.  Abscess is just above base of penis  Musculoskeletal: Normal range of motion.  Neurological: He is alert and oriented to person, place, and time.    Assessment    Suprapubic abscess without complete resolution despite incision and drainage and antibiotics    Plan    I am not satisfied with progress. Will take to the operating room for wide excision of this area with plan to leave it open and heal by secondary intention. I also took a new culture today. He is on clindamycin. Previous culture did not grow any organisms. Procedure, risks, and benefits were discussed in detail with the patient. I also discussed this case with my partner, Dr. Layton, who saw him last week as well. Patient is agreeable. He'll need to stop his Coumadin today.       Lakeysha Slutsky E 09/18/2012, 9:15 AM    

## 2012-09-18 NOTE — Telephone Encounter (Signed)
Pt cancelled his colon appt because he is having a surgical procedure with [facility]. Pt has a Suprapubic abscess that has to be drained. Dr. Arlyce Dice notified.

## 2012-09-18 NOTE — Progress Notes (Signed)
Pt was here 10/13 for another surgery-to come in for pt ptt inr bmet

## 2012-09-19 ENCOUNTER — Encounter (HOSPITAL_BASED_OUTPATIENT_CLINIC_OR_DEPARTMENT_OTHER)
Admission: RE | Admit: 2012-09-19 | Discharge: 2012-09-19 | Disposition: A | Discharge status: Home / Self Care | Payer: Medicare Other | Source: Ambulatory Visit | Attending: General Surgery | Admitting: General Surgery

## 2012-09-19 ENCOUNTER — Encounter (HOSPITAL_BASED_OUTPATIENT_CLINIC_OR_DEPARTMENT_OTHER): Payer: Self-pay | Admitting: *Deleted

## 2012-09-19 DIAGNOSIS — Z85038 Personal history of other malignant neoplasm of large intestine: Secondary | ICD-10-CM | POA: Diagnosis not present

## 2012-09-19 DIAGNOSIS — I4949 Other premature depolarization: Secondary | ICD-10-CM | POA: Diagnosis not present

## 2012-09-19 DIAGNOSIS — Z7901 Long term (current) use of anticoagulants: Secondary | ICD-10-CM | POA: Diagnosis not present

## 2012-09-19 DIAGNOSIS — Z8614 Personal history of Methicillin resistant Staphylococcus aureus infection: Secondary | ICD-10-CM | POA: Diagnosis not present

## 2012-09-19 DIAGNOSIS — I4891 Unspecified atrial fibrillation: Secondary | ICD-10-CM | POA: Diagnosis not present

## 2012-09-19 DIAGNOSIS — Z7982 Long term (current) use of aspirin: Secondary | ICD-10-CM | POA: Diagnosis not present

## 2012-09-19 DIAGNOSIS — I10 Essential (primary) hypertension: Secondary | ICD-10-CM | POA: Diagnosis not present

## 2012-09-19 DIAGNOSIS — E785 Hyperlipidemia, unspecified: Secondary | ICD-10-CM | POA: Diagnosis not present

## 2012-09-19 DIAGNOSIS — L02219 Cutaneous abscess of trunk, unspecified: Secondary | ICD-10-CM | POA: Diagnosis not present

## 2012-09-19 DIAGNOSIS — Z87891 Personal history of nicotine dependence: Secondary | ICD-10-CM | POA: Diagnosis not present

## 2012-09-19 DIAGNOSIS — Z79899 Other long term (current) drug therapy: Secondary | ICD-10-CM | POA: Diagnosis not present

## 2012-09-19 LAB — BASIC METABOLIC PANEL
Chloride: 104 mEq/L (ref 96–112)
GFR calc Af Amer: >90 mL/min (ref >90)
GFR calc non Af Amer: 83 mL/min — ABNORMAL LOW (ref >90)
Potassium: 3.9 mEq/L (ref 3.5–5.1)
Sodium: 140 mEq/L (ref 135–145)

## 2012-09-19 LAB — PROTIME-INR: Prothrombin Time: 14.1 seconds (ref 11.6–15.2)

## 2012-09-19 LAB — APTT: aPTT: 30 seconds (ref 24–37)

## 2012-09-20 ENCOUNTER — Encounter (HOSPITAL_BASED_OUTPATIENT_CLINIC_OR_DEPARTMENT_OTHER): Payer: Self-pay | Admitting: Certified Registered Nurse Anesthetist

## 2012-09-20 ENCOUNTER — Encounter: Payer: Medicare Other | Admitting: Gastroenterology

## 2012-09-20 ENCOUNTER — Other Ambulatory Visit (INDEPENDENT_AMBULATORY_CARE_PROVIDER_SITE_OTHER): Payer: Self-pay

## 2012-09-20 ENCOUNTER — Encounter (HOSPITAL_BASED_OUTPATIENT_CLINIC_OR_DEPARTMENT_OTHER)
Admission: RE | Disposition: A | Discharge status: Home / Self Care | Payer: Self-pay | Source: Ambulatory Visit | Attending: General Surgery

## 2012-09-20 ENCOUNTER — Ambulatory Visit (HOSPITAL_BASED_OUTPATIENT_CLINIC_OR_DEPARTMENT_OTHER): Payer: Medicare Other | Admitting: Certified Registered Nurse Anesthetist

## 2012-09-20 ENCOUNTER — Ambulatory Visit (HOSPITAL_BASED_OUTPATIENT_CLINIC_OR_DEPARTMENT_OTHER)
Admission: RE | Admit: 2012-09-20 | Discharge: 2012-09-20 | Disposition: A | Discharge status: Home / Self Care | Payer: Medicare Other | Source: Ambulatory Visit | Attending: General Surgery | Admitting: General Surgery

## 2012-09-20 ENCOUNTER — Encounter (HOSPITAL_BASED_OUTPATIENT_CLINIC_OR_DEPARTMENT_OTHER): Payer: Self-pay | Admitting: *Deleted

## 2012-09-20 DIAGNOSIS — I10 Essential (primary) hypertension: Secondary | ICD-10-CM | POA: Insufficient documentation

## 2012-09-20 DIAGNOSIS — I4891 Unspecified atrial fibrillation: Secondary | ICD-10-CM | POA: Diagnosis not present

## 2012-09-20 DIAGNOSIS — Z85038 Personal history of other malignant neoplasm of large intestine: Secondary | ICD-10-CM | POA: Insufficient documentation

## 2012-09-20 DIAGNOSIS — Z7982 Long term (current) use of aspirin: Secondary | ICD-10-CM | POA: Insufficient documentation

## 2012-09-20 DIAGNOSIS — L02219 Cutaneous abscess of trunk, unspecified: Secondary | ICD-10-CM

## 2012-09-20 DIAGNOSIS — E785 Hyperlipidemia, unspecified: Secondary | ICD-10-CM | POA: Diagnosis not present

## 2012-09-20 DIAGNOSIS — L03319 Cellulitis of trunk, unspecified: Secondary | ICD-10-CM | POA: Insufficient documentation

## 2012-09-20 DIAGNOSIS — Z8614 Personal history of Methicillin resistant Staphylococcus aureus infection: Secondary | ICD-10-CM | POA: Insufficient documentation

## 2012-09-20 DIAGNOSIS — Z87891 Personal history of nicotine dependence: Secondary | ICD-10-CM | POA: Insufficient documentation

## 2012-09-20 DIAGNOSIS — I4949 Other premature depolarization: Secondary | ICD-10-CM | POA: Insufficient documentation

## 2012-09-20 DIAGNOSIS — L089 Local infection of the skin and subcutaneous tissue, unspecified: Secondary | ICD-10-CM | POA: Diagnosis not present

## 2012-09-20 DIAGNOSIS — Z79899 Other long term (current) drug therapy: Secondary | ICD-10-CM | POA: Insufficient documentation

## 2012-09-20 DIAGNOSIS — L0390 Cellulitis, unspecified: Secondary | ICD-10-CM | POA: Diagnosis not present

## 2012-09-20 DIAGNOSIS — Z7901 Long term (current) use of anticoagulants: Secondary | ICD-10-CM | POA: Insufficient documentation

## 2012-09-20 DIAGNOSIS — L0291 Cutaneous abscess, unspecified: Secondary | ICD-10-CM | POA: Diagnosis not present

## 2012-09-20 HISTORY — PX: INCISION AND DRAINAGE ABSCESS: SHX5864

## 2012-09-20 SURGERY — INCISION AND DRAINAGE, ABSCESS
Anesthesia: General | Site: Abdomen | Wound class: Dirty or Infected

## 2012-09-20 MED ORDER — BUPIVACAINE-EPINEPHRINE 0.5% -1:200000 IJ SOLN
INTRAMUSCULAR | Status: DC | PRN
  Administered 2012-09-20: 20 mL

## 2012-09-20 MED ORDER — ONDANSETRON HCL 4 MG/2ML IJ SOLN
INTRAMUSCULAR | Status: DC | PRN
  Administered 2012-09-20: 4 mg via INTRAVENOUS

## 2012-09-20 MED ORDER — FENTANYL CITRATE 0.05 MG/ML IJ SOLN
INTRAMUSCULAR | Status: DC | PRN
  Administered 2012-09-20: 25 ug via INTRAVENOUS
  Administered 2012-09-20: 50 ug via INTRAVENOUS

## 2012-09-20 MED ORDER — CHLORHEXIDINE GLUCONATE 4 % EX LIQD: 1.0000 "application " | Freq: Once | CUTANEOUS | Status: DC

## 2012-09-20 MED ORDER — 0.9 % SODIUM CHLORIDE (POUR BTL) OPTIME
TOPICAL | Status: DC | PRN
  Administered 2012-09-20: 1000 mL

## 2012-09-20 MED ORDER — LIDOCAINE HCL (CARDIAC) 20 MG/ML IV SOLN
INTRAVENOUS | Status: DC | PRN
  Administered 2012-09-20: 60 mg via INTRAVENOUS

## 2012-09-20 MED ORDER — ONDANSETRON HCL 4 MG/2ML IJ SOLN: 4.0000 mg | Freq: Four times a day (QID) | INTRAMUSCULAR | Status: DC | PRN

## 2012-09-20 MED ORDER — OXYCODONE HCL 5 MG PO TABS: 5.0000 mg | ORAL_TABLET | Freq: Once | ORAL | Status: DC | PRN

## 2012-09-20 MED ORDER — LACTATED RINGERS IV SOLN
INTRAVENOUS | Status: DC
  Administered 2012-09-20: 09:00:00 via INTRAVENOUS

## 2012-09-20 MED ORDER — FENTANYL CITRATE 0.05 MG/ML IJ SOLN: 25.0000 ug | INTRAMUSCULAR | Status: DC | PRN

## 2012-09-20 MED ORDER — DEXAMETHASONE SODIUM PHOSPHATE 4 MG/ML IJ SOLN
INTRAMUSCULAR | Status: DC | PRN
  Administered 2012-09-20: 4 mg via INTRAVENOUS

## 2012-09-20 MED ORDER — OXYCODONE HCL 5 MG PO TABS: 5.0000 mg | ORAL_TABLET | ORAL | Status: DC | PRN

## 2012-09-20 MED ORDER — VANCOMYCIN HCL IN DEXTROSE 1-5 GM/200ML-% IV SOLN
1000.0000 mg | INTRAVENOUS | Status: AC
  Administered 2012-09-20: 1000 mg via INTRAVENOUS

## 2012-09-20 MED ORDER — OXYCODONE HCL 5 MG/5ML PO SOLN: 5.0000 mg | Freq: Once | ORAL | Status: DC | PRN

## 2012-09-20 MED ORDER — PROPOFOL 10 MG/ML IV BOLUS
INTRAVENOUS | Status: DC | PRN
  Administered 2012-09-20: 150 mg via INTRAVENOUS

## 2012-09-20 SURGICAL SUPPLY — 57 items
ADH SKN CLS APL DERMABOND .7 (GAUZE/BANDAGES/DRESSINGS)
APL SKNCLS STERI-STRIP NONHPOA (GAUZE/BANDAGES/DRESSINGS)
BENZOIN TINCTURE PRP APPL 2/3 (GAUZE/BANDAGES/DRESSINGS) IMPLANT
BLADE HEX COATED 2.75 (ELECTRODE) ×2 IMPLANT
BLADE SURG 15 STRL LF DISP TIS (BLADE) ×1 IMPLANT
BLADE SURG 15 STRL SS (BLADE) ×2
BLADE SURG ROTATE 9660 (MISCELLANEOUS) ×2 IMPLANT
CANISTER SUCTION 1200CC (MISCELLANEOUS) ×1 IMPLANT
CHLORAPREP W/TINT 26ML (MISCELLANEOUS) ×2 IMPLANT
CLOTH BEACON ORANGE TIMEOUT ST (SAFETY) ×2 IMPLANT
COVER MAYO STAND STRL (DRAPES) ×2 IMPLANT
COVER TABLE BACK 60X90 (DRAPES) ×2 IMPLANT
DECANTER SPIKE VIAL GLASS SM (MISCELLANEOUS) ×2 IMPLANT
DERMABOND ADVANCED (GAUZE/BANDAGES/DRESSINGS)
DERMABOND ADVANCED .7 DNX12 (GAUZE/BANDAGES/DRESSINGS) IMPLANT
DRAPE PED LAPAROTOMY (DRAPES) ×2 IMPLANT
DRAPE U-SHAPE 76X120 STRL (DRAPES) IMPLANT
DRAPE UTILITY XL STRL (DRAPES) ×2 IMPLANT
ELECT REM PT RETURN 9FT ADLT (ELECTROSURGICAL) ×2
ELECTRODE REM PT RTRN 9FT ADLT (ELECTROSURGICAL) ×1 IMPLANT
GAUZE SPONGE 4X4 12PLY STRL LF (GAUZE/BANDAGES/DRESSINGS) ×2 IMPLANT
GLOVE BIO SURGEON STRL SZ8 (GLOVE) ×2 IMPLANT
GLOVE BIOGEL PI IND STRL 8 (GLOVE) ×1 IMPLANT
GLOVE BIOGEL PI INDICATOR 8 (GLOVE) ×1
GLOVE ECLIPSE 6.5 STRL STRAW (GLOVE) ×1 IMPLANT
GLOVE INDICATOR 6.5 STRL GRN (GLOVE) ×1 IMPLANT
GOWN PREVENTION PLUS XLARGE (GOWN DISPOSABLE) ×2 IMPLANT
GOWN PREVENTION PLUS XXLARGE (GOWN DISPOSABLE) ×2 IMPLANT
NDL HYPO 25X1 1.5 SAFETY (NEEDLE) ×1 IMPLANT
NEEDLE 27GAX1X1/2 (NEEDLE) IMPLANT
NEEDLE HYPO 25X1 1.5 SAFETY (NEEDLE) ×2 IMPLANT
NS IRRIG 1000ML POUR BTL (IV SOLUTION) ×1 IMPLANT
PACK BASIN DAY SURGERY FS (CUSTOM PROCEDURE TRAY) ×2 IMPLANT
PENCIL BUTTON HOLSTER BLD 10FT (ELECTRODE) ×2 IMPLANT
SHEET MEDIUM DRAPE 40X70 STRL (DRAPES) IMPLANT
SLEEVE SCD COMPRESS KNEE MED (MISCELLANEOUS) IMPLANT
SPONGE GAUZE 4X4 12PLY (GAUZE/BANDAGES/DRESSINGS) IMPLANT
SPONGE LAP 4X18 X RAY DECT (DISPOSABLE) ×1 IMPLANT
STRIP CLOSURE SKIN 1/2X4 (GAUZE/BANDAGES/DRESSINGS) IMPLANT
SUT ETHILON 3 0 PS 1 (SUTURE) IMPLANT
SUT ETHILON 5 0 PS 2 18 (SUTURE) IMPLANT
SUT MON AB 4-0 PC3 18 (SUTURE) IMPLANT
SUT SILK 2 0 FS (SUTURE) IMPLANT
SUT VIC AB 3-0 PS1 18 (SUTURE)
SUT VIC AB 3-0 PS1 18XBRD (SUTURE) IMPLANT
SUT VIC AB 3-0 SH 27 (SUTURE)
SUT VIC AB 3-0 SH 27X BRD (SUTURE) IMPLANT
SUT VIC AB 4-0 SH 18 (SUTURE) IMPLANT
SUT VIC AB 5-0 PS2 18 (SUTURE) IMPLANT
SWAB COLLECTION DEVICE MRSA (MISCELLANEOUS) ×2 IMPLANT
SYR BULB 3OZ (MISCELLANEOUS) IMPLANT
SYR CONTROL 10ML LL (SYRINGE) ×2 IMPLANT
TOWEL OR 17X24 6PK STRL BLUE (TOWEL DISPOSABLE) ×4 IMPLANT
TOWEL OR NON WOVEN STRL DISP B (DISPOSABLE) ×2 IMPLANT
TUBE ANAEROBIC SPECIMEN COL (MISCELLANEOUS) IMPLANT
TUBE CONNECTING 20X1/4 (TUBING) ×1 IMPLANT
YANKAUER SUCT BULB TIP NO VENT (SUCTIONS) ×1 IMPLANT

## 2012-09-20 NOTE — Transfer of Care (Signed)
Immediate Anesthesia Transfer of Care Note  Patient: Christian Maxwell  Procedure(s) Performed: Procedure(s): INCISION AND DRAINAGE SUPRAPUBIC ABSCESS (N/A)  Patient Location: PACU  Anesthesia Type:General  Level of Consciousness: awake, alert , oriented and patient cooperative  Airway & Oxygen Therapy: Patient Spontanous Breathing and Patient connected to face mask oxygen  Post-op Assessment: Report given to PACU RN and Post -op Vital signs reviewed and stable  Post vital signs: Reviewed and stable  Complications: No apparent anesthesia complications

## 2012-09-20 NOTE — Anesthesia Postprocedure Evaluation (Signed)
Anesthesia Post Note  Patient: Christian Maxwell  Procedure(s) Performed: Procedure(s) (LRB): INCISION AND DRAINAGE SUPRAPUBIC ABSCESS (N/A)  Anesthesia type: General  Patient location: PACU  Post pain: Pain level controlled and Adequate analgesia  Post assessment: Post-op Vital signs reviewed, Patient's Cardiovascular Status Stable, Respiratory Function Stable, Patent Airway and Pain level controlled  Last Vitals:  Filed Vitals:   09/20/12 1100  BP: 117/76  Pulse: 66  Temp:   Resp: 10    Post vital signs: Reviewed and stable  Level of consciousness: awake, alert  and oriented  Complications: No apparent anesthesia complications

## 2012-09-20 NOTE — Interval H&P Note (Signed)
History and Physical Interval Note:  09/20/2012 9:58 AM  Ly N Scarfone  has presented today for surgery, with the diagnosis of abscess  The various methods of treatment have been discussed with the patient and family. After consideration of risks, benefits and other options for treatment, the patient has consented to  Procedure(s): INCISION AND DRAINAGE SUPRAPUBIC ABSCESS (N/A) as a surgical intervention .  The patient's history has been reviewed, patient re-examined, site marked, no change in status, stable for surgery.  I have reviewed the patient's chart and labs.  Questions were answered to the patient's satisfaction.     Brendaliz Kuk E

## 2012-09-20 NOTE — Anesthesia Procedure Notes (Signed)
Procedure Name: LMA Insertion Date/Time: 09/20/2012 10:06 AM Performed by: Cordai Rodrigue D Pre-anesthesia Checklist: Patient identified, Emergency Drugs available, Suction available and Patient being monitored Patient Re-evaluated:Patient Re-evaluated prior to inductionOxygen Delivery Method: Circle System Utilized Preoxygenation: Pre-oxygenation with 100% oxygen Intubation Type: IV induction Ventilation: Mask ventilation without difficulty LMA: LMA inserted LMA Size: 4.0 Number of attempts: 1 Airway Equipment and Method: bite block Placement Confirmation: positive ETCO2 Tube secured with: Tape Dental Injury: Teeth and Oropharynx as per pre-operative assessment

## 2012-09-20 NOTE — Anesthesia Preprocedure Evaluation (Signed)
Anesthesia Evaluation  Patient identified by MRN, date of birth, ID band Patient awake    Reviewed: Allergy & Precautions, H&P , NPO status , Patient's Chart, lab work & pertinent test results  Airway Mallampati: II  Neck ROM: full    Dental   Pulmonary shortness of breath, former smoker,          Cardiovascular hypertension, + dysrhythmias Atrial Fibrillation     Neuro/Psych    GI/Hepatic   Endo/Other    Renal/GU      Musculoskeletal   Abdominal   Peds  Hematology   Anesthesia Other Findings   Reproductive/Obstetrics                           Anesthesia Physical Anesthesia Plan  ASA: II  Anesthesia Plan: General   Post-op Pain Management:    Induction: Intravenous  Airway Management Planned: LMA  Additional Equipment:   Intra-op Plan:   Post-operative Plan:   Informed Consent: I have reviewed the patients History and Physical, chart, labs and discussed the procedure including the risks, benefits and alternatives for the proposed anesthesia with the patient or authorized representative who has indicated his/her understanding and acceptance.     Plan Discussed with: CRNA, Anesthesiologist and Surgeon  Anesthesia Plan Comments:         Anesthesia Quick Evaluation

## 2012-09-20 NOTE — Op Note (Signed)
09/20/2012  10:40 AM  PATIENT:  Christian Maxwell  79 y.o. male  PRE-OPERATIVE DIAGNOSIS: Suprapubic abscess  POST-OPERATIVE DIAGNOSIS:  suprapubic abcess  PROCEDURE:  Procedure(s): INCISION AND DRAINAGE SUPRAPUBIC ABSCESS  SURGEON:  Surgeon(s): [name], MD  PHYSICIAN ASSISTANT:   ASSISTANTS: none   ANESTHESIA:   local and general  EBL:     BLOOD ADMINISTERED:none  DRAINS: none   SPECIMEN:  Excision  DISPOSITION OF SPECIMEN:  PATHOLOGY  COUNTS:  YES  DICTATION: .Dragon Dictation  Patient presents for incision and drainage of suprapubic abscess. He was identified in the preop holding area. His site was marked. He received intravenous antibiotics. He is brought to the operating room. General anesthesia with laryngeal mask airway was administered by the anesthesia staff. Lower abdomen and genital area were prepped and draped in sterile fashion. Time out procedure was done. Cultures were taken of pink purulent fluid which drained from the previous  I&D site. Local anesthetic was injected.A wide elliptical incision was made to encompass the area of fluctuance. Subcutaneous tissues were dissected down following a channel of chronic granulation tissue and abscess cavity extending slightly to the right of midline. All this tissue was excised. Area was copiously irrigated. No undrained collections were left. Cautery was used on some chronic granulation tissue in the area and to get good hemostasis. Wound was packed with sterile saline soaked gauze and covered with a dry dressing. All counts were correct. Patient tolerated procedure well without apparent complication and was taken recovery in stable condition  PATIENT DISPOSITION:  PACU - hemodynamically stable.   Delay start of Pharmacological VTE agent (>24hrs) due to surgical blood loss or risk of bleeding:  no  Violeta Gelinas, MD, MPH, FACS Pager: [phone]  5/9/201410:40 AM

## 2012-09-20 NOTE — H&P (View-Only) (Signed)
Patient ID: Christian Maxwell, male   DOB: 01-11-1933, 79 y.o.   MRN: [id number]  Chief Complaint  Patient presents with  . Follow-up    f/u suprapubic abscess    HPI Christian Maxwell is a 79 y.o. male.  CC: ongoing suprapubic abscess HPI  Patient presents for F/U suprapubic abscess.  Redness has abated on clinda but drainage persists.He removed the packing prior to this visit.  Past Medical History  Diagnosis Date  . Hypertension   . Hyperlipidemia   . Syncope and collapse   . Atrial fibrillation   . Dyspnea   . PVC's (premature ventricular contractions)   . Abscess 7/13    abd abscess  . MRSA (methicillin resistant Staphylococcus aureus)   . Colon cancer 2000    T3N0  . Normal nuclear stress test 2004  . METHICILLIN RESISTANT STAPHYLOCOCCUS AUREUS INFECTION 12/12/2006    Annotation: stitch abcess in lower abdomen after colon cancer resection Qualifier: Diagnosis of  By: [name] MD, [name]    . Postoperative stitch abscess 07/19/2011  . Internal hemorrhoids   . Hx of adenomatous colonic polyps     Past Surgical History  Procedure Laterality Date  . Lumbar laminectomy  1977  . Wound exploration  2006    For possible stitch abscess Dr [name]  . Incise and drain abcess  2009,2010,2011,2012  . Hernia repair  09/2002    DB  . Colon surgery  2000  . Mass excision  03/07/2012    Procedure: EXCISION MASS;  Surgeon: [name], MD;  Location: [facility];  Service: General;  Laterality: Right;  removal mass posterior right arm  . Polypectomy    . Colonoscopy      Family History  Problem Relation Age of Onset  . Stroke Mother   . Stroke Father   . Hypertension Father   . Heart attack Father   . Stroke Sister   . Stroke Brother   . Stroke Brother   . Diabetes Brother   . Heart disease Brother   . Colon cancer Neg Hx     Social History History  Substance Use Topics  . Smoking status: Former Smoker    Quit date: 10/13/1977  . Smokeless tobacco:  Former Neurosurgeon    Types: Chew    Quit date: 10/13/1977  . Alcohol Use: No    No Known Allergies  Current Outpatient Prescriptions  Medication Sig Dispense Refill  . aspirin 81 MG tablet Take 81 mg by mouth daily. 2 po daily      . CALCIUM PO Take by mouth daily.        . clindamycin (CLEOCIN) 150 MG capsule Take 2 capsules (300 mg total) by mouth 3 (three) times daily.  56 capsule  0  . fish oil-omega-3 fatty acids 1000 MG capsule Take by mouth daily.        [name] FOLIC ACID PO Take by mouth daily.        . metoprolol tartrate (LOPRESSOR) 25 MG tablet Take 1 tablet (25 mg total) by mouth 2 (two) times daily.  60 tablet  5  . Na Sulfate-K Sulfate-Mg Sulf (SUPREP BOWEL PREP) SOLN Take 1 kit by mouth once. suprep as directed. No substitutions  354 mL  0  . oxyCODONE (ROXICODONE) 5 MG immediate release tablet Take 1 tablet (5 mg total) by mouth every 6 (six) hours as needed for pain.  30 tablet  0  . simvastatin (ZOCOR) 40 MG tablet Take 1  tablet (40 mg total) by mouth every evening.  90 tablet  2  . Thiamine HCl (VITAMIN B-1 PO) Take by mouth daily.        [name] warfarin (COUMADIN) 5 MG tablet TAKE AS DIRECTED BY COUMADIN CLINIC  40 tablet  2  . zolpidem (AMBIEN) 10 MG tablet Take 1 tablet (10 mg total) by mouth at bedtime as needed for sleep.  30 tablet  0   No current facility-administered medications for this visit.    Review of Systems Review of Systems  Blood pressure 126/60, pulse 64, temperature 97.4 F (36.3 C), temperature source Temporal, resp. rate 16, height 5\' 10"  (1.778 m), weight 188 lb (85.276 kg).  Physical Exam Physical Exam  Constitutional: He is oriented to person, place, and time. He appears well-developed and well-nourished.  HENT:  Head: Normocephalic.  Eyes: Pupils are equal, round, and reactive to light.  Cardiovascular: Normal rate and regular rhythm.   Pulmonary/Chest: Effort normal and breath sounds normal. No respiratory distress. He has no wheezes.  Abdominal:  Soft. He exhibits no distension. There is no tenderness.    No generalized tenderness, suprapubic area has decreased erythema, less but persistent fluctuance, opening probing and 10 cc of bloody purulent fluid was evacuated, plain new gauze was placed as packing  Genitourinary: Penis normal.  Abscess is just above base of penis  Musculoskeletal: Normal range of motion.  Neurological: He is alert and oriented to person, place, and time.    Assessment    Suprapubic abscess without complete resolution despite incision and drainage and antibiotics    Plan    I am not satisfied with progress. Will take to the operating room for wide excision of this area with plan to leave it open and heal by secondary intention. I also took a new culture today. He is on clindamycin. Previous culture did not grow any organisms. Procedure, risks, and benefits were discussed in detail with the patient. I also discussed this case with my partner, Dr. Biagio Quint, who saw him last week as well. Patient is agreeable. He'll need to stop his Coumadin today.       Min Tunnell E 09/18/2012, 9:15 AM

## 2012-09-21 ENCOUNTER — Telehealth (INDEPENDENT_AMBULATORY_CARE_PROVIDER_SITE_OTHER): Payer: Self-pay | Admitting: Surgery

## 2012-09-21 DIAGNOSIS — I4891 Unspecified atrial fibrillation: Secondary | ICD-10-CM | POA: Diagnosis not present

## 2012-09-21 DIAGNOSIS — Z85038 Personal history of other malignant neoplasm of large intestine: Secondary | ICD-10-CM | POA: Diagnosis not present

## 2012-09-21 DIAGNOSIS — Z48817 Encounter for surgical aftercare following surgery on the skin and subcutaneous tissue: Secondary | ICD-10-CM | POA: Diagnosis not present

## 2012-09-21 DIAGNOSIS — M6281 Muscle weakness (generalized): Secondary | ICD-10-CM | POA: Diagnosis not present

## 2012-09-21 LAB — WOUND CULTURE
Gram Stain: NONE SEEN
Gram Stain: NONE SEEN
Organism ID, Bacteria: NO GROWTH

## 2012-09-21 NOTE — Telephone Encounter (Signed)
Patient with recurrent stitch/abdominal wall abscesses.  Underwent incision and drainage yesterday.  Wound packed.  They called today noting that home health has not called nor come by.  They do not have a number.  They do not recall this being discussed at discharge with the paperwork.  They claim they cannot find the discharge paperwork  However, they recall being told it was going to happen.  They do not feel comfortable doing dressing changes.  He is not having horrible drainage nor pain.  I apologize but noted that it would be difficult to set up home health on the weekend.  I recommend the outer dressing be changed daily and leave the packing in.  I recommended they come to the office Monday for the first dressing change and to have home health set up

## 2012-09-22 DIAGNOSIS — Z48817 Encounter for surgical aftercare following surgery on the skin and subcutaneous tissue: Secondary | ICD-10-CM | POA: Diagnosis not present

## 2012-09-22 DIAGNOSIS — I4891 Unspecified atrial fibrillation: Secondary | ICD-10-CM | POA: Diagnosis not present

## 2012-09-22 DIAGNOSIS — M6281 Muscle weakness (generalized): Secondary | ICD-10-CM | POA: Diagnosis not present

## 2012-09-22 DIAGNOSIS — Z85038 Personal history of other malignant neoplasm of large intestine: Secondary | ICD-10-CM | POA: Diagnosis not present

## 2012-09-23 ENCOUNTER — Telehealth (INDEPENDENT_AMBULATORY_CARE_PROVIDER_SITE_OTHER): Payer: Self-pay

## 2012-09-23 ENCOUNTER — Encounter (HOSPITAL_BASED_OUTPATIENT_CLINIC_OR_DEPARTMENT_OTHER): Payer: Self-pay | Admitting: General Surgery

## 2012-09-23 DIAGNOSIS — Z48817 Encounter for surgical aftercare following surgery on the skin and subcutaneous tissue: Secondary | ICD-10-CM | POA: Diagnosis not present

## 2012-09-23 DIAGNOSIS — M6281 Muscle weakness (generalized): Secondary | ICD-10-CM | POA: Diagnosis not present

## 2012-09-23 DIAGNOSIS — Z85038 Personal history of other malignant neoplasm of large intestine: Secondary | ICD-10-CM | POA: Diagnosis not present

## 2012-09-23 DIAGNOSIS — I4891 Unspecified atrial fibrillation: Secondary | ICD-10-CM | POA: Diagnosis not present

## 2012-09-23 LAB — CULTURE, ROUTINE-ABSCESS

## 2012-09-23 LAB — POCT HEMOGLOBIN-HEMACUE: Hemoglobin: 15.6 g/dL (ref 13.0–17.0)

## 2012-09-23 NOTE — Telephone Encounter (Signed)
I called and let Minus Liberty know that either is fine.

## 2012-09-23 NOTE — Telephone Encounter (Signed)
Yes - either

## 2012-09-23 NOTE — Telephone Encounter (Signed)
Johnny Bridge with [employer] called to ask the same question. The answer was relayed to her.

## 2012-09-23 NOTE — Telephone Encounter (Signed)
I called the pt to set up his follow up appointment for May 19th.  I asked him about home health and did he get a call this weekend.  He said he just talked to them.  I asked if they went out there this weekend and he said yes, Saturday and Sunday.  They will talk to him about going out today too.  I said I had to make sure because I heard he called Saturday because he hadn't heard from anyone.  He said they came later.

## 2012-09-23 NOTE — Telephone Encounter (Signed)
Rosie called to report Mrs Dudziak refuses to learn the dressing change.  Rosie asked if she can switch to Duracel Ag or Aquacel Ag so it can be done 3 x per week.

## 2012-09-25 DIAGNOSIS — M6281 Muscle weakness (generalized): Secondary | ICD-10-CM | POA: Diagnosis not present

## 2012-09-25 DIAGNOSIS — Z48817 Encounter for surgical aftercare following surgery on the skin and subcutaneous tissue: Secondary | ICD-10-CM | POA: Diagnosis not present

## 2012-09-25 DIAGNOSIS — I4891 Unspecified atrial fibrillation: Secondary | ICD-10-CM | POA: Diagnosis not present

## 2012-09-25 DIAGNOSIS — Z85038 Personal history of other malignant neoplasm of large intestine: Secondary | ICD-10-CM | POA: Diagnosis not present

## 2012-09-25 LAB — ANAEROBIC CULTURE

## 2012-09-27 DIAGNOSIS — M6281 Muscle weakness (generalized): Secondary | ICD-10-CM | POA: Diagnosis not present

## 2012-09-27 DIAGNOSIS — I4891 Unspecified atrial fibrillation: Secondary | ICD-10-CM | POA: Diagnosis not present

## 2012-09-27 DIAGNOSIS — Z48817 Encounter for surgical aftercare following surgery on the skin and subcutaneous tissue: Secondary | ICD-10-CM | POA: Diagnosis not present

## 2012-09-27 DIAGNOSIS — Z85038 Personal history of other malignant neoplasm of large intestine: Secondary | ICD-10-CM | POA: Diagnosis not present

## 2012-09-30 ENCOUNTER — Ambulatory Visit (INDEPENDENT_AMBULATORY_CARE_PROVIDER_SITE_OTHER): Payer: Medicare Other | Admitting: General Surgery

## 2012-09-30 ENCOUNTER — Encounter (INDEPENDENT_AMBULATORY_CARE_PROVIDER_SITE_OTHER): Payer: Self-pay | Admitting: General Surgery

## 2012-09-30 VITALS — BP 126/79 | HR 78 | Temp 98.6°F | Resp 16 | Ht 70.0 in | Wt 189.0 lb

## 2012-09-30 DIAGNOSIS — L02219 Cutaneous abscess of trunk, unspecified: Secondary | ICD-10-CM

## 2012-09-30 NOTE — Progress Notes (Signed)
Subjective:     Patient ID: Christian Maxwell, male   DOB: [birth year], 79 y.o.   MRN: [id number]  HPI S/p I&D/excision suprapubic abscess  Review of Systems     Objective:   Physical Exam Wound clean, no cellulitis, wet to dry placed    Assessment:     Doing well    Plan:     Operative CXs reviewed - all negative.  Finish ABX.  See Dr. Ainsley Spinner From ID this week.  Return 10/09/12.

## 2012-10-01 ENCOUNTER — Ambulatory Visit (INDEPENDENT_AMBULATORY_CARE_PROVIDER_SITE_OTHER): Payer: Medicare Other | Admitting: Pharmacist

## 2012-10-01 DIAGNOSIS — Z85038 Personal history of other malignant neoplasm of large intestine: Secondary | ICD-10-CM | POA: Diagnosis not present

## 2012-10-01 DIAGNOSIS — I4891 Unspecified atrial fibrillation: Secondary | ICD-10-CM

## 2012-10-01 DIAGNOSIS — M6281 Muscle weakness (generalized): Secondary | ICD-10-CM | POA: Diagnosis not present

## 2012-10-01 DIAGNOSIS — Z48817 Encounter for surgical aftercare following surgery on the skin and subcutaneous tissue: Secondary | ICD-10-CM | POA: Diagnosis not present

## 2012-10-02 ENCOUNTER — Ambulatory Visit: Payer: Medicare Other | Admitting: Infectious Disease

## 2012-10-02 DIAGNOSIS — I4891 Unspecified atrial fibrillation: Secondary | ICD-10-CM | POA: Diagnosis not present

## 2012-10-02 DIAGNOSIS — M6281 Muscle weakness (generalized): Secondary | ICD-10-CM | POA: Diagnosis not present

## 2012-10-02 DIAGNOSIS — Z85038 Personal history of other malignant neoplasm of large intestine: Secondary | ICD-10-CM | POA: Diagnosis not present

## 2012-10-02 DIAGNOSIS — Z48817 Encounter for surgical aftercare following surgery on the skin and subcutaneous tissue: Secondary | ICD-10-CM | POA: Diagnosis not present

## 2012-10-04 DIAGNOSIS — I4891 Unspecified atrial fibrillation: Secondary | ICD-10-CM | POA: Diagnosis not present

## 2012-10-04 DIAGNOSIS — M6281 Muscle weakness (generalized): Secondary | ICD-10-CM | POA: Diagnosis not present

## 2012-10-04 DIAGNOSIS — Z48817 Encounter for surgical aftercare following surgery on the skin and subcutaneous tissue: Secondary | ICD-10-CM | POA: Diagnosis not present

## 2012-10-04 DIAGNOSIS — Z85038 Personal history of other malignant neoplasm of large intestine: Secondary | ICD-10-CM | POA: Diagnosis not present

## 2012-10-07 DIAGNOSIS — M6281 Muscle weakness (generalized): Secondary | ICD-10-CM | POA: Diagnosis not present

## 2012-10-07 DIAGNOSIS — Z48817 Encounter for surgical aftercare following surgery on the skin and subcutaneous tissue: Secondary | ICD-10-CM | POA: Diagnosis not present

## 2012-10-07 DIAGNOSIS — Z85038 Personal history of other malignant neoplasm of large intestine: Secondary | ICD-10-CM | POA: Diagnosis not present

## 2012-10-07 DIAGNOSIS — I4891 Unspecified atrial fibrillation: Secondary | ICD-10-CM | POA: Diagnosis not present

## 2012-10-09 ENCOUNTER — Ambulatory Visit (INDEPENDENT_AMBULATORY_CARE_PROVIDER_SITE_OTHER): Payer: Medicare Other | Admitting: General Surgery

## 2012-10-09 ENCOUNTER — Encounter (INDEPENDENT_AMBULATORY_CARE_PROVIDER_SITE_OTHER): Payer: Self-pay | Admitting: General Surgery

## 2012-10-09 VITALS — BP 130/76 | HR 74 | Temp 98.0°F | Resp 18 | Ht 70.0 in | Wt 188.0 lb

## 2012-10-09 DIAGNOSIS — L02219 Cutaneous abscess of trunk, unspecified: Secondary | ICD-10-CM

## 2012-10-09 DIAGNOSIS — R1900 Intra-abdominal and pelvic swelling, mass and lump, unspecified site: Secondary | ICD-10-CM

## 2012-10-09 DIAGNOSIS — R222 Localized swelling, mass and lump, trunk: Secondary | ICD-10-CM

## 2012-10-09 NOTE — Progress Notes (Signed)
Subjective:     Patient ID: Christian Maxwell, male   DOB: 14-Mar-1933, 79 y.o.   MRN: [id number]  HPI  Patient presents status post incision, drainage, and debridement of suprapubic abscess. On nursing is applying a silver impregnated dressing. He is feeling better. He did not go to his infectious disease appointment. He claims he did not want to go. Review of Systems     Objective:   Physical Exam Wound is clean, still about a centimeter and a half deep. There is no undrained collection or fluctuance. No ongoing cellulitis. Wet-to-dry dressing was placed.    Assessment:     Healing gradually status post incision, drainage, and debridement of suprapubic abscess. All cultures have been negative.    Plan:     He has completed his previous course of antibiotics. He did not show up for his infectious disease referral. I recommend ongoing wound care. I will see him back next month. We have been in regular contact with home health nursing as well.

## 2012-10-10 DIAGNOSIS — I4891 Unspecified atrial fibrillation: Secondary | ICD-10-CM | POA: Diagnosis not present

## 2012-10-10 DIAGNOSIS — Z85038 Personal history of other malignant neoplasm of large intestine: Secondary | ICD-10-CM | POA: Diagnosis not present

## 2012-10-10 DIAGNOSIS — M6281 Muscle weakness (generalized): Secondary | ICD-10-CM | POA: Diagnosis not present

## 2012-10-10 DIAGNOSIS — Z48817 Encounter for surgical aftercare following surgery on the skin and subcutaneous tissue: Secondary | ICD-10-CM | POA: Diagnosis not present

## 2012-10-14 DIAGNOSIS — Z85038 Personal history of other malignant neoplasm of large intestine: Secondary | ICD-10-CM | POA: Diagnosis not present

## 2012-10-14 DIAGNOSIS — Z48817 Encounter for surgical aftercare following surgery on the skin and subcutaneous tissue: Secondary | ICD-10-CM | POA: Diagnosis not present

## 2012-10-14 DIAGNOSIS — I4891 Unspecified atrial fibrillation: Secondary | ICD-10-CM | POA: Diagnosis not present

## 2012-10-14 DIAGNOSIS — M6281 Muscle weakness (generalized): Secondary | ICD-10-CM | POA: Diagnosis not present

## 2012-10-16 ENCOUNTER — Telehealth (INDEPENDENT_AMBULATORY_CARE_PROVIDER_SITE_OTHER): Payer: Self-pay | Admitting: General Surgery

## 2012-10-16 NOTE — Telephone Encounter (Signed)
Christian Maxwell called to let us know they are d/c this patient b/c he is no longer home bound and his wife has been checked off to continue his wound dressing changes.

## 2012-10-17 ENCOUNTER — Ambulatory Visit (INDEPENDENT_AMBULATORY_CARE_PROVIDER_SITE_OTHER): Payer: Medicare Other

## 2012-10-17 DIAGNOSIS — I4891 Unspecified atrial fibrillation: Secondary | ICD-10-CM | POA: Diagnosis not present

## 2012-10-17 LAB — FUNGUS CULTURE W SMEAR

## 2012-10-17 LAB — POCT INR: INR: 2.7

## 2012-10-21 ENCOUNTER — Telehealth: Payer: Self-pay

## 2012-10-21 NOTE — Telephone Encounter (Signed)
pts wife said pt has appt 10/22/12 to establish with Dr Reece Agar; wants to make sure problem pt has with shoulder can be addressed at that appt. Mrs Wacker will be coming with pt and will mention to Dr Reece Agar tomorrow.

## 2012-10-22 ENCOUNTER — Ambulatory Visit (INDEPENDENT_AMBULATORY_CARE_PROVIDER_SITE_OTHER): Payer: Medicare Other | Admitting: Family Medicine

## 2012-10-22 ENCOUNTER — Encounter: Payer: Self-pay | Admitting: Family Medicine

## 2012-10-22 VITALS — BP 116/78 | HR 64 | Temp 97.5°F | Ht 70.0 in | Wt 187.8 lb

## 2012-10-22 DIAGNOSIS — G4700 Insomnia, unspecified: Secondary | ICD-10-CM

## 2012-10-22 DIAGNOSIS — J302 Other seasonal allergic rhinitis: Secondary | ICD-10-CM

## 2012-10-22 DIAGNOSIS — E079 Disorder of thyroid, unspecified: Secondary | ICD-10-CM | POA: Insufficient documentation

## 2012-10-22 DIAGNOSIS — J309 Allergic rhinitis, unspecified: Secondary | ICD-10-CM

## 2012-10-22 DIAGNOSIS — M25519 Pain in unspecified shoulder: Secondary | ICD-10-CM

## 2012-10-22 DIAGNOSIS — L02219 Cutaneous abscess of trunk, unspecified: Secondary | ICD-10-CM

## 2012-10-22 DIAGNOSIS — E785 Hyperlipidemia, unspecified: Secondary | ICD-10-CM | POA: Diagnosis not present

## 2012-10-22 DIAGNOSIS — M25512 Pain in left shoulder: Secondary | ICD-10-CM | POA: Insufficient documentation

## 2012-10-22 DIAGNOSIS — I4891 Unspecified atrial fibrillation: Secondary | ICD-10-CM

## 2012-10-22 MED ORDER — TRAZODONE 25 MG HALF TABLET: 25.0000 mg | ORAL_TABLET | Freq: Every evening | ORAL | Status: DC | PRN

## 2012-10-22 NOTE — Assessment & Plan Note (Signed)
Recommended OTC antihistamine and nasal saline as needed.

## 2012-10-22 NOTE — Assessment & Plan Note (Signed)
Long-term use of ambien - discussed sleep hygiene. Will do trial of trazodone 25-50mg  nightly.

## 2012-10-22 NOTE — Assessment & Plan Note (Signed)
Chronic, stable.  Continue to follow with cards. On aspirin 81mg  and warfarin.

## 2012-10-22 NOTE — Assessment & Plan Note (Signed)
Per pt healing well.  Followed by surgery.

## 2012-10-22 NOTE — Assessment & Plan Note (Signed)
Continue simvastatin. Check FLP next fasting blood draw

## 2012-10-22 NOTE — Progress Notes (Signed)
Subjective:    Patient ID: Christian Maxwell, male    DOB: 11/30/1932, 79 y.o.   MRN: [id number]  HPI CC: establish care  Christian Maxwell is a 79 yo with h/o HTN, HLD, afib on coumadin, h/o colon cancer 2000 s/p colon surgery, and recent suprapubic abscess abscess followed by surgery who presents today to establish care.  Recent suprapubic abscess s/p I&D by surgery.  Insomnia - on ambien 10mg  nightly for years and years.  Finds that he awakens nightly at 2-3am.  Desires to try another medication.  No eating after 4:30pm.  Drinks water with simvastatin at night.    L shoulder pain - flares up with working outside.  Takes aspirin 162mg  for this.  No current pains.  Tries tylenol for this as well.  Thyroid disease - followed by Dr. Evlyn Kanner, currently stable off meds.  Seasonal allergies - rhinorrhea.    H/o colon cancer - per pt, had colonoscopy 2012 by Dr. Arlyce Dice but not in chart   Caffeine: none Lives with wife (45yrs).  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  No h/o AMW.  Medications and allergies reviewed and updated in chart.  Past histories reviewed and updated if relevant as below. Patient Active Problem List   Diagnosis Date Noted  . Suprapubic abscess 08/21/2012  . H/O colon cancer, stage I 06/24/2012  . Adenomatous colon polyp 06/24/2012  . Costochondritis 04/18/2012  . Open wound 03/11/2012  . Postoperative stitch abscess 12/27/2011  . Hematoma, postoperative 12/12/2011  . Dyspnea 06/08/2011  . Chest pain 04/21/2011  . Dizziness 04/21/2011  . Hyperlipidemia 10/14/2010  . Atrial fibrillation 08/08/2010  . INSOMNIA, MILD 12/12/2006  . INJURY, SUPERFICIAL, HEAD NEC W/O INFECTION 12/12/2006   Past Medical History  Diagnosis Date  . Hypertension   . Hyperlipidemia   . Syncope and collapse   . Atrial fibrillation   . PVC's (premature ventricular  contractions)   . Abscess 7/13    abd abscess  . MRSA (methicillin resistant Staphylococcus aureus)   . Colon cancer 2000    T3N0  . Normal nuclear stress test 2004  . METHICILLIN RESISTANT STAPHYLOCOCCUS AUREUS INFECTION 12/12/2006    Annotation: stitch abcess in lower abdomen after colon cancer resection Qualifier: Diagnosis of  By: [name] MD, [name]    . Postoperative stitch abscess 07/19/2011  . Internal hemorrhoids   . Hx of adenomatous colonic polyps   . Dyspnea    Past Surgical History  Procedure Laterality Date  . Lumbar laminectomy  1977  . Wound exploration  2006    For possible stitch abscess Dr [name]  . Incise and drain abcess  2009,2010,2011,2012  . Hernia repair  09/2002    DB  . Colon surgery  2000  . Mass excision  03/07/2012    Procedure: EXCISION MASS;  Surgeon: [name], MD;  Location: [facility];  Service: General;  Laterality: Right;  removal mass posterior right arm  . Polypectomy    . Colonoscopy    . Incision and drainage abscess N/A 09/20/2012    Procedure: INCISION AND DRAINAGE SUPRAPUBIC ABSCESS;  Surgeon: [name], MD;  Location: [facility];  Service: General;  Laterality: N/A;   History  Substance Use Topics  . Smoking status: Former Smoker    Quit date: 10/13/1977  . Smokeless tobacco: Former Neurosurgeon    Types: Sports administrator  Quit date: 10/13/1977  . Alcohol Use: No   Family History  Problem Relation Age of Onset  . Stroke Mother   . Stroke Father   . Hypertension Father   . Heart attack Father   . Stroke Sister   . Stroke Brother   . Stroke Brother   . Diabetes Brother   . Heart disease Brother   . Colon cancer Neg Hx    No Known Allergies Current Outpatient Prescriptions on File Prior to Visit  Medication Sig Dispense Refill  . aspirin 81 MG tablet Take 81 mg by mouth daily. 2 po daily      . CALCIUM PO Take by mouth daily.        . fish oil-omega-3 fatty acids 1000 MG capsule Take by mouth  daily.       [name] FOLIC ACID PO Take by mouth daily.        . metoprolol tartrate (LOPRESSOR) 25 MG tablet Take 1 tablet (25 mg total) by mouth 2 (two) times daily.  60 tablet  5  . simvastatin (ZOCOR) 40 MG tablet Take 1 tablet (40 mg total) by mouth every evening.  90 tablet  2  . Thiamine HCl (VITAMIN B-1 PO) Take by mouth daily.        [name] warfarin (COUMADIN) 5 MG tablet TAKE AS DIRECTED BY COUMADIN CLINIC  40 tablet  2  . zolpidem (AMBIEN) 10 MG tablet Take 1 tablet (10 mg total) by mouth at bedtime as needed for sleep.  30 tablet  0   No current facility-administered medications on file prior to visit.      Review of Systems  Constitutional: Negative for fever, chills, activity change, appetite change, fatigue and unexpected weight change.  HENT: Negative for hearing loss and neck pain.   Eyes: Negative for visual disturbance.  Respiratory: Negative for cough, chest tightness, shortness of breath and wheezing.   Cardiovascular: Negative for chest pain, palpitations and leg swelling.  Gastrointestinal: Negative for nausea, vomiting, abdominal pain, diarrhea, constipation, blood in stool and abdominal distention.  Genitourinary: Negative for hematuria and difficulty urinating.  Musculoskeletal: Negative for myalgias and arthralgias.  Skin: Negative for rash.  Neurological: Negative for dizziness, seizures, syncope and headaches.  Hematological: Negative for adenopathy. Does not bruise/bleed easily.  Psychiatric/Behavioral: Negative for dysphoric mood. The patient is not nervous/anxious.        Objective:   Physical Exam  Nursing note and vitals reviewed. Constitutional: He is oriented to person, place, and time. He appears well-developed and well-nourished. No distress.  HENT:  Head: Normocephalic and atraumatic.  Right Ear: Hearing, tympanic membrane, external ear and ear canal normal.  Left Ear: Hearing, tympanic membrane, external ear and ear canal normal.  Nose: Nose normal.   Mouth/Throat: Oropharynx is clear and moist. No oropharyngeal exudate.  Eyes: Conjunctivae and EOM are normal. Pupils are equal, round, and reactive to light. No scleral icterus.  Neck: Normal range of motion. Neck supple. Carotid bruit is not present. No thyromegaly present.  Cardiovascular: Normal rate, normal heart sounds and intact distal pulses.  An irregular rhythm present.  No murmur heard. Pulses:      Radial pulses are 2+ on the right side, and 2+ on the left side.  Pulmonary/Chest: Effort normal and breath sounds normal. No respiratory distress. He has no wheezes. He has no rales.  Abdominal: Soft. Bowel sounds are normal. He exhibits no distension and no mass. There is no tenderness. There is no rebound and no  guarding.  Musculoskeletal: Normal range of motion. He exhibits no edema.  Lymphadenopathy:    He has no cervical adenopathy.  Neurological: He is alert and oriented to person, place, and time.  CN grossly intact, station and gait intact  Skin: Skin is warm and dry. No rash noted.  Psychiatric: He has a normal mood and affect. His behavior is normal. Judgment and thought content normal.       Assessment & Plan:

## 2012-10-22 NOTE — Patient Instructions (Addendum)
You are doing well. Let's stop ambien. Try trazodone 25 mg at bedtime.  After a few days may increse to 50mg  at bedtime (whole pill). Try claritin over the counter for allergies. Good to see you today, call us with questions. Return at your convenience fasting for blood work and afterwards for [employer] visit (3-6 months.)

## 2012-10-22 NOTE — Assessment & Plan Note (Signed)
Currently without pain. Will continue to monitor  Discussed use of tylenol prn. Anticipate shoulder strain vs bursitis.

## 2012-10-22 NOTE — Assessment & Plan Note (Signed)
Followed by Dr. Evlyn Kanner. Pending appt in next few months.

## 2012-10-25 ENCOUNTER — Encounter: Payer: Self-pay | Admitting: Cardiovascular Disease

## 2012-10-28 ENCOUNTER — Encounter: Payer: Self-pay | Admitting: Cardiovascular Disease

## 2012-10-30 ENCOUNTER — Encounter (INDEPENDENT_AMBULATORY_CARE_PROVIDER_SITE_OTHER): Payer: Self-pay | Admitting: General Surgery

## 2012-10-30 ENCOUNTER — Ambulatory Visit (INDEPENDENT_AMBULATORY_CARE_PROVIDER_SITE_OTHER): Payer: Medicare Other | Admitting: General Surgery

## 2012-10-30 ENCOUNTER — Telehealth (INDEPENDENT_AMBULATORY_CARE_PROVIDER_SITE_OTHER): Payer: Self-pay | Admitting: General Surgery

## 2012-10-30 VITALS — BP 130/79 | HR 86 | Temp 96.8°F | Resp 14 | Ht 70.0 in | Wt 187.9 lb

## 2012-10-30 DIAGNOSIS — L02219 Cutaneous abscess of trunk, unspecified: Secondary | ICD-10-CM | POA: Diagnosis not present

## 2012-10-30 DIAGNOSIS — L02211 Cutaneous abscess of abdominal wall: Secondary | ICD-10-CM

## 2012-10-30 DIAGNOSIS — L03319 Cellulitis of trunk, unspecified: Secondary | ICD-10-CM | POA: Diagnosis not present

## 2012-10-30 NOTE — Telephone Encounter (Signed)
Called [phone] to set up referral to infectious disease. Per their office- to fax referral and they will contact patient with appt. Referral faxed to [phone]. Awaiting appt date/time.

## 2012-10-30 NOTE — Progress Notes (Signed)
Subjective:     Patient ID: Christian Maxwell, male   DOB: 1932-08-20, 79 y.o.   MRN: [id number]  HPI Patient presents for followup for abdominal wall abscess. He is status post incision and debridement. He is doing daily wound care just with a Band-Aid. Wound is much smaller. He feels it's much better. He also does complain of several bruises on both of his arms. He is on Coumadin.  Review of Systems     Objective:   Physical Exam Suprapubic abscess is almost completely healed. There is minimal fibrinous discharge. Silver nitrate was applied to the exposed granulation tissue. It tunnels  less than 1 cm inside and is only a few millimeters across.    Assessment:     Suprapubic abscess nearly healed. Bruising on his arms likely from mild trauma complicated by anticoagulation.    Plan:     Patient has reconsidered and evaluation by infectious disease. I will refer him again. I also called his wife while he was here in the office and answered her questions. She is very interested in having the infectious disease consultation regarding his recurrent abscesses. Provided his wound heals over the next 2 weeks, he may return as needed.

## 2012-11-02 LAB — AFB CULTURE WITH SMEAR (NOT AT ARMC)

## 2012-11-04 ENCOUNTER — Ambulatory Visit (INDEPENDENT_AMBULATORY_CARE_PROVIDER_SITE_OTHER): Payer: Medicare Other | Admitting: Surgery

## 2012-11-04 ENCOUNTER — Encounter (INDEPENDENT_AMBULATORY_CARE_PROVIDER_SITE_OTHER): Payer: Self-pay | Admitting: Surgery

## 2012-11-04 VITALS — BP 108/64 | HR 78 | Temp 97.4°F | Resp 17 | Ht 70.0 in | Wt 188.4 lb

## 2012-11-04 DIAGNOSIS — L02219 Cutaneous abscess of trunk, unspecified: Secondary | ICD-10-CM

## 2012-11-04 MED ORDER — DOXYCYCLINE HYCLATE 50 MG PO CAPS: 100.0000 mg | ORAL_CAPSULE | Freq: Two times a day (BID) | ORAL | Status: DC

## 2012-11-04 NOTE — Patient Instructions (Signed)
Staphylococcal Infections   Staphylococcus aureus (Staph) is a germ that may cause infections, especially on broken skin or wounds. Methicillin is a drug sometimes used to treat Staph infections. If the germ is resistant to methicillin, it is called MRSA. Methicillin and some other drugs may not work to treat the infection. However, there are other antibiotic drugs that may be used that will treat the infection.  Your caregiver may do a culture from your wound, skin, or other site. This will tell him/her that you have MRSA present. Sometimes healthy people carry MRSA, but it may also cause an infection.   Staph infections, including MRSA can spread from one person to another by contact with an infected person. You may prevent spreading an MRSA infection to those you live with or others around you by following these steps:   Keep infections and pus or drainage material covered with clean dry bandages. Follow your caregiver's instructions on proper care of the wound. Pus from infected wounds can contain MRSA and spread the germ (bacteria) to others.   Advise your family and other close contacts to wash their hands frequently with soap and warm water. This should be done especially if they change your bandages or touch the infected wound or infectious materials.   Avoid sharing personal items (towels, washcloth, razor, clothing, or uniforms) that may have had contact with the infected wound.   Wash linens and clothes that become soiled, with hot water and laundry detergent. Drying clothes in a hot dryer, rather than air-drying, also helps kill bacteria in clothes.   Tell any healthcare providers who treat you that you have an MRSA infection. In the hospital steps will be taken to prevent the spread of MRSA.   Ask your caregiver about return to school or return to work if you have a Staph or MRSA infection.    If your caregiver has given you a follow-up appointment, it is very important to keep that appointment. Not keeping the appointment could result in a chronic or permanent injury, pain, and disability. If there is any problem keeping the appointment, you must call back to this facility for assistance.  Staph and MRSA infections can become very serious and you should contact your caregiver if your infection gets worse. To fight the infection, follow your doctor's instructions for wound care and take all medicines as prescribed.  SEEK MEDICAL CARE IF:    You have increased pus coming from the wound.   You have a fever.   You notice a bad smell coming from the wound or dressing.  SEEK IMMEDIATE MEDICAL CARE IF:   You have redness, red streaks, swelling, or increasing pain in the wound.  Document Released: 07/22/2002 Document Revised: 07/24/2011 Document Reviewed: 12/16/2007  ExitCare Patient Information 2014 [employer].

## 2012-11-04 NOTE — Progress Notes (Signed)
General Surgery [facility]  Visit Diagnoses: 1. Suprapubic abscess    Patient of Dr. Violeta Gelinas  HISTORY: Patient is a 78 year old male followed by Dr. [name] for recurrent abscesses involving the suprapubic region and upper extremity. Patient has had persistent drainage from the suprapubic wound where he underwent incision and drainage several weeks ago. He has completed his course of oral antibiotics. His wife continues to change the dressings daily.  PERTINENT REVIEW OF SYSTEMS: Small brownish drainage on each dressing change. No bleeding. No new lesions.  EXAM: HEENT: normocephalic; pupils equal and reactive; sclerae clear; dentition good; mucous membranes moist ABDOMEN: Abdomen is soft without distention; small, punctate opening just above the base of the penis and to the left of midline; small brownish drainage noted; wound is probed with Q-tip to a depth of 1 cm; induration superior to the wound; no fluctuance EXT:  non-tender without edema; no deformity NEURO: no gross focal deficits; no sign of tremor   IMPRESSION: Persistent chronic wound infection, suprapubic, likely methicillin-resistant staph aureus given patient history  PLAN: I am going to start the patient on doxycycline 100 mg twice daily. He will be out of town for the next 10 days. He has an appointment to return and see Dr. [name] in the near future.  Patient would like to see an infectious disease physician in consultation. I will asked Dr. [name] or his nurse to make arrangements for that consultation.  Velora Heckler, MD, [facility] Office: [phone]

## 2012-11-05 ENCOUNTER — Telehealth (INDEPENDENT_AMBULATORY_CARE_PROVIDER_SITE_OTHER): Payer: Self-pay | Admitting: *Deleted

## 2012-11-05 ENCOUNTER — Other Ambulatory Visit (INDEPENDENT_AMBULATORY_CARE_PROVIDER_SITE_OTHER): Payer: Self-pay | Admitting: *Deleted

## 2012-11-05 MED ORDER — ZOLPIDEM TARTRATE 10 MG PO TABS: 10.0000 mg | ORAL_TABLET | Freq: Every evening | ORAL | Status: DC | PRN

## 2012-11-05 NOTE — Telephone Encounter (Signed)
Patient called to request a refill of his Ambien.  Spoke to Dr. [name] who approved 1 refill at this time.  Prescription called into [facility] [phone]

## 2012-11-07 ENCOUNTER — Telehealth (INDEPENDENT_AMBULATORY_CARE_PROVIDER_SITE_OTHER): Payer: Self-pay | Admitting: General Surgery

## 2012-11-07 NOTE — Telephone Encounter (Signed)
Called to check on referral date for infection disease referral. Spoke with [facility]. She states she had spoke with Mr Wainer who refused date she offered him and told her he would call her to set this up. She has not heard back from him.

## 2012-11-08 ENCOUNTER — Encounter: Payer: Self-pay | Admitting: Gastroenterology

## 2012-11-11 ENCOUNTER — Ambulatory Visit: Payer: Medicare Other | Admitting: Internal Medicine

## 2012-11-14 ENCOUNTER — Ambulatory Visit: Payer: Medicare Other | Admitting: Family Medicine

## 2012-11-20 ENCOUNTER — Encounter (INDEPENDENT_AMBULATORY_CARE_PROVIDER_SITE_OTHER): Payer: Medicare Other | Admitting: General Surgery

## 2012-11-25 ENCOUNTER — Ambulatory Visit: Payer: Medicare Other | Admitting: Cardiovascular Disease

## 2012-11-25 ENCOUNTER — Ambulatory Visit (INDEPENDENT_AMBULATORY_CARE_PROVIDER_SITE_OTHER): Payer: Medicare Other | Admitting: *Deleted

## 2012-11-25 DIAGNOSIS — I4891 Unspecified atrial fibrillation: Secondary | ICD-10-CM | POA: Diagnosis not present

## 2012-11-25 LAB — POCT INR: INR: 3.9

## 2012-11-27 ENCOUNTER — Other Ambulatory Visit: Payer: Self-pay | Admitting: *Deleted

## 2012-11-27 MED ORDER — SIMVASTATIN 40 MG PO TABS: 40.0000 mg | ORAL_TABLET | Freq: Every evening | ORAL | Status: DC

## 2012-11-27 NOTE — Telephone Encounter (Signed)
Fax Received. Refill Completed. Helem Reesor Chowoe (R.M.A)   

## 2012-12-04 ENCOUNTER — Other Ambulatory Visit (INDEPENDENT_AMBULATORY_CARE_PROVIDER_SITE_OTHER): Payer: Self-pay | Admitting: General Surgery

## 2012-12-04 ENCOUNTER — Encounter (INDEPENDENT_AMBULATORY_CARE_PROVIDER_SITE_OTHER): Payer: Self-pay | Admitting: General Surgery

## 2012-12-04 ENCOUNTER — Ambulatory Visit (INDEPENDENT_AMBULATORY_CARE_PROVIDER_SITE_OTHER): Payer: Medicare Other | Admitting: General Surgery

## 2012-12-04 ENCOUNTER — Telehealth (INDEPENDENT_AMBULATORY_CARE_PROVIDER_SITE_OTHER): Payer: Self-pay | Admitting: General Surgery

## 2012-12-04 VITALS — BP 128/74 | HR 72 | Temp 97.0°F | Resp 15 | Ht 70.0 in | Wt 187.5 lb

## 2012-12-04 DIAGNOSIS — D235 Other benign neoplasm of skin of trunk: Secondary | ICD-10-CM | POA: Diagnosis not present

## 2012-12-04 DIAGNOSIS — L03319 Cellulitis of trunk, unspecified: Secondary | ICD-10-CM | POA: Diagnosis not present

## 2012-12-04 DIAGNOSIS — L820 Inflamed seborrheic keratosis: Secondary | ICD-10-CM | POA: Diagnosis not present

## 2012-12-04 DIAGNOSIS — L02219 Cutaneous abscess of trunk, unspecified: Secondary | ICD-10-CM | POA: Diagnosis not present

## 2012-12-04 DIAGNOSIS — D225 Melanocytic nevi of trunk: Secondary | ICD-10-CM | POA: Insufficient documentation

## 2012-12-04 NOTE — Telephone Encounter (Signed)
LMOM letting pt know that he has an appt w/ Dr. [name] on 7/31 at 9:00.  Address was given.

## 2012-12-04 NOTE — Telephone Encounter (Signed)
Correction: w/ Dr. Luciana Axe

## 2012-12-04 NOTE — Progress Notes (Signed)
Subjective:     Patient ID: Christian Maxwell, male   DOB: Oct 19, 1932, 79 y.o.   MRN: [id number]  HPI Patient is to follow up with suprapubic abscess. He still is a small amount of drainage from the area. He is taking doxycycline after seeing my partner, Dr. Gerrit Friends in the office. Additionally, he requests removal of a lesion that has been growing on his chest over his sternum.  Review of Systems     Objective:   Physical Exam  Constitutional: He appears well-developed and well-nourished.  Cardiovascular:  Irregularly irregular  Pulmonary/Chest: Effort normal. No respiratory distress. He has no wheezes.    8 mm Skin mass anterior chest over the sternum  Abdominal: Soft. There is no tenderness.  Just above his penis there is a small hole which drains brown fluid. It tracks less than 1 cm deep. The channel was treated with silver nitrate. No surrounding cellulitis or fluctuance   His chest was prepped in sterile fashion. Local anesthetic was injected. The nevus was removed and a shave biopsy fashion. It was completely removed. Silver nitrate was used to get hemostasis.     Assessment:      1. Suprapubic abscess without complete resolution 2. Nevus chest    Plan:     Nevus was removed as above. I will call him with pathology.He will complete his course of antibiotics. We have arranged infectious disease consultation. I will see him back as needed afterwards.

## 2012-12-09 ENCOUNTER — Telehealth (INDEPENDENT_AMBULATORY_CARE_PROVIDER_SITE_OTHER): Payer: Self-pay

## 2012-12-09 NOTE — Telephone Encounter (Signed)
Pt called stating he noticed a nodule on his arm that is sore if you press on it and wants to come by our office for an xray of area. No redness or fever. Pt states he has not had any recent IV or injury. Pt advised to see his pcp or urgent care to have area evaluated and an xray if indicated. Pt advised we do not have imaging here in our office.  Pt states he understands.

## 2012-12-10 ENCOUNTER — Telehealth: Payer: Self-pay | Admitting: General Surgery

## 2012-12-10 NOTE — Telephone Encounter (Signed)
Left VM with pathology result

## 2012-12-11 ENCOUNTER — Encounter: Payer: Self-pay | Admitting: Physician Assistant

## 2012-12-11 ENCOUNTER — Ambulatory Visit (INDEPENDENT_AMBULATORY_CARE_PROVIDER_SITE_OTHER): Payer: Medicare Other | Admitting: Pharmacist

## 2012-12-11 ENCOUNTER — Telehealth (INDEPENDENT_AMBULATORY_CARE_PROVIDER_SITE_OTHER): Payer: Self-pay | Admitting: General Surgery

## 2012-12-11 ENCOUNTER — Ambulatory Visit (INDEPENDENT_AMBULATORY_CARE_PROVIDER_SITE_OTHER): Payer: Medicare Other | Admitting: Physician Assistant

## 2012-12-11 VITALS — BP 113/76 | HR 66 | Ht 70.0 in | Wt 187.0 lb

## 2012-12-11 DIAGNOSIS — R229 Localized swelling, mass and lump, unspecified: Secondary | ICD-10-CM | POA: Diagnosis not present

## 2012-12-11 DIAGNOSIS — E785 Hyperlipidemia, unspecified: Secondary | ICD-10-CM | POA: Diagnosis not present

## 2012-12-11 DIAGNOSIS — I4891 Unspecified atrial fibrillation: Secondary | ICD-10-CM

## 2012-12-11 LAB — POCT INR: INR: 2.9

## 2012-12-11 NOTE — Progress Notes (Signed)
[address] [place], [place]  [address] Phone: [phone] Fax:  [phone]  Date:  12/11/2012   ID:  Christian Maxwell, DOB 30-Jul-1932, MRN [id number]  PCP:  Eustaquio Boyden, MD  Cardiologist:  Dr. Delane Ginger     History of Present Illness: Christian Maxwell is a 79 y.o. male who returns for follow up.  He has a hx of permanent AFib, HTN, HL, hypothyroidism.  Cardiolite in 9/04: EF 67%, no ischemia.  Echo 06/2011: Mild LVH, EF 55-60%, mild MR, moderate LAE, mild to moderate RAE.  Last seen by Dr. Elease Hashimoto 05/2012. He denies any melena, hematochezia, hematuria, hemoptysis. He continues to exercise on his bike 1-2 times a day. He denies chest pain or shortness of breath. Denies syncope, orthopnea, PND or edema.  Labs (4/13):  K 4.7, Cr 0.9, ALT 23, LDL 104  Labs (5/14):  K 3.9, Cr 0.82, Hgb 15.6  Wt Readings from Last 3 Encounters:  12/11/12 187 lb (84.823 kg)  12/04/12 187 lb 8 oz (85.049 kg)  11/04/12 188 lb 6.4 oz (85.458 kg)     Past Medical History  Diagnosis Date  . Hypertension   . Hyperlipidemia   . Syncope and collapse     pt denies  . Atrial fibrillation   . PVC's (premature ventricular contractions)   . Abscess 7/13    abd abscess  . History of colon cancer 2000    T3N0 s/p colectomy  . Normal nuclear stress test 2004  . METHICILLIN RESISTANT STAPHYLOCOCCUS AUREUS INFECTION 12/12/2006    Annotation: stitch abcess in lower abdomen after colon cancer resection Qualifier: Diagnosis of  By: [name] MD, [name]    . Postoperative stitch abscess 07/19/2011  . Hx of adenomatous colonic polyps   . Thyroid disease     followed by Dr. Evlyn Kanner  . Insomnia     Current Outpatient Prescriptions  Medication Sig Dispense Refill  . aspirin 81 MG tablet Take 162 mg by mouth daily.       [name] CALCIUM PO Take by mouth daily.        [name] doxycycline (VIBRAMYCIN) 50 MG capsule Take 2 capsules (100 mg total) by mouth 2 (two) times daily.  28 capsule  1  . fish oil-omega-3  fatty acids 1000 MG capsule Take by mouth daily.       [name] FOLIC ACID PO Take by mouth daily.        . metoprolol tartrate (LOPRESSOR) 25 MG tablet Take 1 tablet (25 mg total) by mouth 2 (two) times daily.  60 tablet  5  . simvastatin (ZOCOR) 40 MG tablet Take 1 tablet (40 mg total) by mouth every evening.  90 tablet  2  . Thiamine HCl (VITAMIN B-1 PO) Take by mouth daily.        . traZODone (DESYREL) 25 mg TABS Take 0.5-1 tablets (25-50 mg total) by mouth at bedtime as needed.  60 tablet  3  . warfarin (COUMADIN) 5 MG tablet TAKE AS DIRECTED BY COUMADIN CLINIC  40 tablet  2  . zolpidem (AMBIEN) 10 MG tablet Take 1 tablet (10 mg total) by mouth at bedtime as needed for sleep.  30 tablet  0   No current facility-administered medications for this visit.    Allergies:   No Known Allergies  Social History:  The patient  reports that he quit smoking about 35 years ago. He quit smokeless tobacco use about 35 years ago. His smokeless tobacco use included  Chew. He reports that he does not drink alcohol or use illicit drugs.   ROS:  Please see the history of present illness.      All other systems reviewed and negative.   PHYSICAL EXAM: VS:  BP 113/76  Pulse 66  Ht 5\' 10"  (1.778 m)  Wt 187 lb (84.823 kg)  BMI 26.83 kg/m2 Well nourished, well developed, in no acute distress HEENT: normal Neck: no JVD Cardiac:  normal S1, S2; irregularly irregular rhythm; no murmur Lungs:  clear to auscultation bilaterally, no wheezing, rhonchi or rales Abd: soft, nontender, no hepatomegaly Ext: no edema Skin: warm and dry Neuro:  CNs 2-12 intact, no focal abnormalities noted  EKG:  Atrial fibrillation, HR 66     ASSESSMENT AND PLAN:  1. Atrial Fibrillation:  Rate is controlled. He is tolerating Coumadin. Continue current therapy. 2. Hypertension: Controlled. Continue current therapy. 3. Hyperlipidemia: Continue statin. 4. Disposition: Follow up with Dr. Elease Hashimoto in 6 months.  Signed, Tereso Newcomer, PA-C    12/11/2012 4:41 PM

## 2012-12-11 NOTE — Patient Instructions (Signed)
Schedule follow up with Dr. Delane Ginger in 6 mos.

## 2012-12-12 ENCOUNTER — Ambulatory Visit (INDEPENDENT_AMBULATORY_CARE_PROVIDER_SITE_OTHER): Payer: Medicare Other | Admitting: Internal Medicine

## 2012-12-12 ENCOUNTER — Encounter: Payer: Self-pay | Admitting: Internal Medicine

## 2012-12-12 VITALS — BP 109/73 | HR 78 | Temp 98.3°F | Ht 70.0 in | Wt 190.0 lb

## 2012-12-12 DIAGNOSIS — Z22322 Carrier or suspected carrier of Methicillin resistant Staphylococcus aureus: Secondary | ICD-10-CM

## 2012-12-12 DIAGNOSIS — Z8614 Personal history of Methicillin resistant Staphylococcus aureus infection: Secondary | ICD-10-CM | POA: Diagnosis not present

## 2012-12-12 DIAGNOSIS — L03319 Cellulitis of trunk, unspecified: Secondary | ICD-10-CM

## 2012-12-12 DIAGNOSIS — L02219 Cutaneous abscess of trunk, unspecified: Secondary | ICD-10-CM | POA: Diagnosis not present

## 2012-12-12 MED ORDER — MUPIROCIN 2 % EX OINT: TOPICAL_OINTMENT | Freq: Two times a day (BID) | CUTANEOUS | Status: DC

## 2012-12-12 MED ORDER — CHLORHEXIDINE GLUCONATE 4 % EX LIQD: 60.0000 mL | Freq: Every day | CUTANEOUS | Status: DC

## 2012-12-12 NOTE — Progress Notes (Signed)
  Subjective:    Patient ID: Christian Maxwell, male    DOB: 02/28/1933, 79 y.o.   MRN: [id number]  HPI Is in for evaluation as a new patient. She has a history of colon cancer about 14 years ago with abdominal surgery and then has had persistent problem of developing an abscess in that area. His wife tells me that it occurs about every 2 years. He also tells me that he had an abscess that developed on his right arm as well last year. He recently was seen by his surgeon after drainage and this is healing up. He has had no fever or chills. He has tried mupirocin in the nasal passages. He has not tried any Hibiclens.  He is not interested in bleach bath.   Review of Systems  Constitutional: Negative for fever, appetite change and fatigue.  Eyes: Negative for visual disturbance.  Respiratory: Negative for cough.   Cardiovascular: Negative for leg swelling.  Gastrointestinal: Negative for nausea and diarrhea.  Musculoskeletal: Negative for myalgias and arthralgias.  Skin: Positive for wound. Negative for rash.  Allergic/Immunologic: Negative for immunocompromised state.  Neurological: Negative for dizziness, light-headedness and headaches.  Hematological: Negative for adenopathy.  Psychiatric/Behavioral: Negative for dysphoric mood.       Objective:   Physical Exam  Constitutional: He is oriented to person, place, and time. He appears well-developed and well-nourished. No distress.  Eyes: Right eye exhibits no discharge. Left eye exhibits no discharge. No scleral icterus.  Neurological: He is alert and oriented to person, place, and time.  Skin:  + open area on abdomen, about2-3 mm.  Some serosanguinous drainage  Psychiatric: He has a normal mood and affect. His behavior is normal.          Assessment & Plan:

## 2012-12-12 NOTE — Assessment & Plan Note (Signed)
Though no culture has actually grown MRSA, the recurrent nature of this does make this likely. I discussed with him the options of Bactroban and Hibiclens and is going to try that. He is resistant to trying a bleach bath and he does have difficulty getting up and down.Christian Maxwell He will followup p.r.n.

## 2012-12-16 ENCOUNTER — Telehealth (INDEPENDENT_AMBULATORY_CARE_PROVIDER_SITE_OTHER): Payer: Self-pay

## 2012-12-16 ENCOUNTER — Other Ambulatory Visit: Payer: Self-pay | Admitting: *Deleted

## 2012-12-16 MED ORDER — WARFARIN SODIUM 5 MG PO TABS: ORAL_TABLET | ORAL | Status: DC

## 2012-12-16 NOTE — Telephone Encounter (Signed)
The pt called back stating he had an xray at [facility] on [address] and asked if we had it.  I told him we don't and they aren't in the same system as [place].  [place] and wants their phone #.  I looked it up.  The [facility] is on [address] and I gave him the phone #.

## 2012-12-16 NOTE — Telephone Encounter (Signed)
Patient calling into office to check to see if we have rec'd any x-rays.  Patient was advised that we have not rec'd any x-rays at this time.  Patient req'd to speak with Huntley Dec, RN.  Called transferred.

## 2012-12-16 NOTE — Telephone Encounter (Signed)
Message copied by Ivory Broad on Mon Dec 16, 2012  9:52 AM ------      Message from: Zacarias Pontes      Created: Mon Dec 16, 2012  9:40 AM       Pt would like a callcack with xray results .Marland KitchenMarland [phone] ------

## 2012-12-16 NOTE — Telephone Encounter (Signed)
I left a message for the pt to call and see if I can help him.  We do not have any xrays.  If he means the pathology report it was benign, seborrheic keratosis.

## 2012-12-17 ENCOUNTER — Telehealth (INDEPENDENT_AMBULATORY_CARE_PROVIDER_SITE_OTHER): Payer: Self-pay

## 2012-12-17 DIAGNOSIS — M629 Disorder of muscle, unspecified: Secondary | ICD-10-CM | POA: Diagnosis not present

## 2012-12-17 NOTE — Telephone Encounter (Signed)
I have the office visit notes, so we just need the xray report please

## 2012-12-17 NOTE — Telephone Encounter (Signed)
Patient is asking if we have received his xray's from [facility]. Clydie Braun at  [facility] states x ray needs to be pick up by patient and they would fax B. Knots PA   Ov notes . Advised patient he would need to pick up x ray before his appointment on 12/25/12 with Dr. [name]. Patient verbalized understanding

## 2012-12-17 NOTE — Telephone Encounter (Signed)
Referral sent per [facility]  Urgent Care # [phone]

## 2012-12-18 ENCOUNTER — Telehealth (INDEPENDENT_AMBULATORY_CARE_PROVIDER_SITE_OTHER): Payer: Self-pay

## 2012-12-18 NOTE — Telephone Encounter (Signed)
Is he willing to come in to the urgent office?

## 2012-12-18 NOTE — Telephone Encounter (Signed)
[place] beach[place]  [place] will return Saturday so they can be seen Monday.  She doesn't think he wants it lanced in the office.

## 2012-12-18 NOTE — Telephone Encounter (Signed)
I called the pt's wife and notified her that I rec'd the xray report.  I told her the results.  She said she knew that.  She states the area on his elbow is an abscess like he keeps getting.  He is putting ice on it.  He is unable to have it lanced in the office because it is deep and she knows he will need to have outpatient surgery.  She asked if he can see Dr [name] Monday or Tuesday.  I let her know Dr [name] is not in until Wednesday.  She told me to let Dr [name] know what is going on.  I will call her back if we can work anything else out for her.

## 2012-12-18 NOTE — Telephone Encounter (Signed)
I am post call Monday and have surgery scheduled also. Will he see urgent doc Monday or should I find a time maybe Tuesday?

## 2012-12-18 NOTE — Telephone Encounter (Signed)
I called the pt and he accepted an appointment for Monday on urgent office.  It is at 2:30 with Dr Ezzard Standing.  He will call me back if he needs to change that once he talks to his wife.

## 2012-12-23 ENCOUNTER — Encounter (INDEPENDENT_AMBULATORY_CARE_PROVIDER_SITE_OTHER): Payer: Medicare Other | Admitting: Surgery

## 2012-12-25 ENCOUNTER — Ambulatory Visit (INDEPENDENT_AMBULATORY_CARE_PROVIDER_SITE_OTHER): Payer: Medicare Other | Admitting: General Surgery

## 2012-12-25 ENCOUNTER — Encounter (INDEPENDENT_AMBULATORY_CARE_PROVIDER_SITE_OTHER): Payer: Self-pay | Admitting: General Surgery

## 2012-12-25 VITALS — BP 126/74 | HR 68 | Temp 98.1°F | Resp 15 | Ht 70.0 in | Wt 189.2 lb

## 2012-12-25 DIAGNOSIS — L723 Sebaceous cyst: Secondary | ICD-10-CM

## 2012-12-25 DIAGNOSIS — L02219 Cutaneous abscess of trunk, unspecified: Secondary | ICD-10-CM

## 2012-12-25 DIAGNOSIS — L03319 Cellulitis of trunk, unspecified: Secondary | ICD-10-CM

## 2012-12-25 DIAGNOSIS — L089 Local infection of the skin and subcutaneous tissue, unspecified: Secondary | ICD-10-CM

## 2012-12-25 NOTE — Progress Notes (Signed)
Subjective:     Patient ID: Christian Maxwell, male   DOB: [birth year], 79 y.o.   MRN: [id number]  HPI Patient C/O painful swelling L arm. He got an x-ray that showed no fracture or abnormality. He has had no drainage. He did see the ID clinic and they placed him on mupiricin.    Review of Systems     Objective:   Physical Exam Suprapubic area has a small sinus with minimal tan drainage, this area was treated with silver nitrate  Left arm on anterolateral aspect of the humerus has a palpable subcutaneous mass consistent with a cyst. This was known and with local and then aspirated. There was bloody purulent fluid. Therefore further incision and drainage was done. The cyst was incised and drained this and further bloody purulent fluid. Wound was cleaned out and packed with quarter-inch iodoform gauze. Culture dressing was applied.    Assessment:     Chronic suprapubic abscess, infected sebaceous cyst left arm    Plan:     Continue doxycycline. Incision and drainage as above. He will remove the packing on Friday and cover with gauze. I will see him back for further followup. He is also requested infectious disease evaluation at [facility]. He did see the clinic at [facility] but wants a second opinion.

## 2012-12-28 LAB — WOUND CULTURE: Gram Stain: NONE SEEN

## 2013-01-02 ENCOUNTER — Telehealth (INDEPENDENT_AMBULATORY_CARE_PROVIDER_SITE_OTHER): Payer: Self-pay | Admitting: General Surgery

## 2013-01-02 DIAGNOSIS — L02219 Cutaneous abscess of trunk, unspecified: Secondary | ICD-10-CM

## 2013-01-02 NOTE — Telephone Encounter (Signed)
Error

## 2013-01-02 NOTE — Telephone Encounter (Signed)
Patient called in wanting a refill on his antibiotics.  I asked him to give me the name of the antibiotic he is currently taking and he explained that it was cephalexin.  I asked him who prescribed that antibiotic and he said he believed that it was Dr. [name].  Informed him that Dr. [name] prescribed him doxycycline and I asked him if he had taken that prescription.  He explained that [place] to the beach and forgot the prescription there.  He took if from 12/25/12-12/29/12.  Explained that because Dr. [name] was out of the office today but that I would take this around to our urgent office doctor today to see if he would refill the prescription.  The patient said that his abscess is getting better and healing but that it still is not healed.

## 2013-01-02 NOTE — Addendum Note (Signed)
Addended byLittie Deeds on: 01/02/2013 02:20 PM   Modules accepted: Orders

## 2013-01-02 NOTE — Telephone Encounter (Signed)
Refill it

## 2013-01-02 NOTE — Telephone Encounter (Signed)
Called patient to let him know that we refilled his Rx to [facility]

## 2013-01-03 ENCOUNTER — Emergency Department (INDEPENDENT_AMBULATORY_CARE_PROVIDER_SITE_OTHER)
Admission: EM | Admit: 2013-01-03 | Discharge: 2013-01-03 | Disposition: A | Discharge status: Home / Self Care | Payer: Medicare Other | Source: Home / Self Care | Attending: Family Medicine | Admitting: Family Medicine

## 2013-01-03 ENCOUNTER — Encounter (HOSPITAL_COMMUNITY): Payer: Self-pay | Admitting: Emergency Medicine

## 2013-01-03 DIAGNOSIS — J029 Acute pharyngitis, unspecified: Secondary | ICD-10-CM | POA: Diagnosis not present

## 2013-01-03 MED ORDER — DPH-LIDO-ALHYDR-MGHYDR-SIMETH MT SUSP: OROMUCOSAL | Status: DC

## 2013-01-03 MED ORDER — ONDANSETRON 4 MG PO TBDP
ORAL_TABLET | ORAL | Status: AC
  Filled 2013-01-03: qty 2

## 2013-01-03 NOTE — ED Notes (Addendum)
C/O pain in lower throat upper chest. Pt states only feels pain after taking deep breath. Pt states he feels as if something in throat.  No fever, no coughing.... Jan Ranson, MA student

## 2013-01-03 NOTE — ED Provider Notes (Signed)
CSN: [id number]     Arrival date & time 01/03/13  4098 History     First MD Initiated Contact with Patient 01/03/13 1009     Chief Complaint  Patient presents with  . Sore Throat   (Consider location/radiation/quality/duration/timing/severity/associated sxs/prior Treatment) HPI Comments: 79 year old male with a history of atrial for relation, coronary artery disease, hypertension, hyperlipidemia presents for evaluation of sore throat. This is been going on for 3 days. He has a soreness and pressure in his lower throat above the area of the sternal notch. This is made worse by taking a deep breath. He has been taking cough drops for this which seemed to help clear up slightly. He denies having any cough, chest pain, NVD, diaphoresis, fever, chills. No history of heartburn.  Patient is a 79 y.o. male presenting with pharyngitis.  Sore Throat Pertinent negatives include no chest pain, no abdominal pain and no shortness of breath.    Past Medical History  Diagnosis Date  . Hypertension   . Hyperlipidemia   . Syncope and collapse     pt denies  . Atrial fibrillation   . PVC's (premature ventricular contractions)   . Abscess 7/13    abd abscess  . History of colon cancer 2000    T3N0 s/p colectomy  . Normal nuclear stress test 2004  . METHICILLIN RESISTANT STAPHYLOCOCCUS AUREUS INFECTION 12/12/2006    Annotation: stitch abcess in lower abdomen after colon cancer resection Qualifier: Diagnosis of  By: [name] MD, [name]    . Postoperative stitch abscess 07/19/2011  . Hx of adenomatous colonic polyps   . Thyroid disease     followed by Dr. Evlyn Kanner  . Insomnia    Past Surgical History  Procedure Laterality Date  . Lumbar laminectomy  1977  . Wound exploration  2006    For possible stitch abscess Dr [name]  . Incise and drain abcess  2009,2010,2011,2012  . Hernia repair  09/2002    DB  . Colectomy  2000    6 inches removed  . Mass excision  03/07/2012    EXCISION MASS;  Surgeon:  [name], MD;  Laterality: Right;  removal mass posterior right arm  . Polypectomy    . Colonoscopy  2012    per patient (Dr. Arlyce Dice)  . Incision and drainage abscess N/A 09/20/2012    Procedure: INCISION AND DRAINAGE SUPRAPUBIC ABSCESS;  Surgeon: [name], MD;  Location: [facility];  Service: General;  Laterality: N/A;   Family History  Problem Relation Age of Onset  . Stroke Mother   . Stroke Father   . Hypertension Father   . CAD Father 37    MI  . Stroke Sister 63  . Stroke Brother   . Stroke Brother   . Diabetes Brother   . CAD Brother 29    MI  . Cancer Neg Hx    History  Substance Use Topics  . Smoking status: Former Smoker    Quit date: 10/13/1977  . Smokeless tobacco: Former Neurosurgeon    Types: Chew    Quit date: 10/13/1977  . Alcohol Use: No    Review of Systems  Constitutional: Negative for fever, chills and fatigue.  HENT: Positive for sore throat. Negative for neck pain and neck stiffness.   Eyes: Negative for visual disturbance.  Respiratory: Negative for cough and shortness of breath.   Cardiovascular: Negative for chest pain, palpitations and leg swelling.  Gastrointestinal: Negative for nausea, vomiting, abdominal pain, diarrhea  and constipation.  Genitourinary: Negative for dysuria, urgency, frequency and hematuria.  Musculoskeletal: Negative for myalgias and arthralgias.  Skin: Negative for rash.  Neurological: Negative for dizziness, weakness and light-headedness.    Allergies  Review of patient's allergies indicates no known allergies.  Home Medications   Current Outpatient Rx  Name  Route  Sig  Dispense  Refill  . aspirin 81 MG tablet   Oral   Take 162 mg by mouth daily.          [name] CALCIUM PO   Oral   Take by mouth daily.           . chlorhexidine (HIBICLENS) 4 % external liquid   Topical   Apply 60 mLs (4 application total) topically daily. Daily for 4 days.  May repeat after 1 week.   120 mL   1   .  doxycycline (VIBRAMYCIN) 50 MG capsule   Oral   Take 2 capsules (100 mg total) by mouth 2 (two) times daily.   28 capsule   1   . DPH-Lido-AlHydr-MgHydr-Simeth SUSP      1 tsp gargle and swallow QID PRN   200 mL   0     Equal parts of all ingredients   . fish oil-omega-3 fatty acids 1000 MG capsule   Oral   Take by mouth daily.          [name] FOLIC ACID PO   Oral   Take by mouth daily.           . metoprolol tartrate (LOPRESSOR) 25 MG tablet   Oral   Take 1 tablet (25 mg total) by mouth 2 (two) times daily.   60 tablet   5   . mupirocin ointment (BACTROBAN) 2 %   Topical   Apply topically 2 (two) times daily. For 7 days to each nasal passage and under fingernails   22 g   1   . simvastatin (ZOCOR) 40 MG tablet   Oral   Take 1 tablet (40 mg total) by mouth every evening.   90 tablet   2   . Thiamine HCl (VITAMIN B-1 PO)   Oral   Take by mouth daily.           . traZODone (DESYREL) 25 mg TABS   Oral   Take 0.5-1 tablets (25-50 mg total) by mouth at bedtime as needed.   60 tablet   3   . warfarin (COUMADIN) 5 MG tablet      TAKE AS DIRECTED BY COUMADIN CLINIC   40 tablet   3   . zolpidem (AMBIEN) 10 MG tablet   Oral   Take 1 tablet (10 mg total) by mouth at bedtime as needed for sleep.   30 tablet   0    BP 117/73  Temp(Src) 98.4 F (36.9 C) (Oral)  Resp 12  SpO2 99% Physical Exam  Nursing note and vitals reviewed. Constitutional: He is oriented to person, place, and time. He appears well-developed and well-nourished. No distress.  HENT:  Head: Normocephalic and atraumatic.  Right Ear: External ear normal.  Left Ear: External ear normal.  Nose: Nose normal.  Mouth/Throat: Oropharynx is clear and moist. No oropharyngeal exudate.  Neck: Normal range of motion. No JVD present.  Cardiovascular: Normal rate and normal heart sounds.  An irregularly irregular rhythm present. Exam reveals no gallop.   No murmur heard. Pulmonary/Chest: Effort  normal and breath sounds normal. No respiratory distress. He has no wheezes.  He has no rales. He exhibits no mass, no tenderness, no bony tenderness, no deformity, no swelling and no retraction.    Musculoskeletal: Normal range of motion. He exhibits no tenderness.  Lymphadenopathy:       Head (right side): Tonsillar adenopathy present.       Head (left side): Tonsillar adenopathy present.  Neurological: He is oriented to person, place, and time. Coordination normal.  Skin: Skin is warm and dry. No rash noted. He is not diaphoretic.  Psychiatric: He has a normal mood and affect. Judgment normal.    ED Course   Procedures (including critical care time)  Labs Reviewed - No data to display No results found. 1. Pharyngitis     No obvious causes for this patient's symptoms. Given history of CAD and MI, and checking EKG.  MDM  EKG is normal. This is most likely related to a viral infection. Treat with Magic mouthwash, he'll followup with his primary care physician in a couple days to recheck.    Meds ordered this encounter  Medications  . DPH-Lido-AlHydr-MgHydr-Simeth SUSP    Sig: 1 tsp gargle and swallow QID PRN    Dispense:  200 mL    Refill:  0    Equal parts of all ingredients     Graylon Good, PA-C 01/03/13 1130

## 2013-01-04 NOTE — ED Provider Notes (Signed)
Medical screening examination/treatment/procedure(s) were performed by non-physician practitioner and as supervising physician I was immediately available for consultation/collaboration.   Maxwell,Christian Dani; MD  Christian Crescenzo Moreno-Coll, MD 01/04/13 0851 

## 2013-01-06 ENCOUNTER — Encounter (INDEPENDENT_AMBULATORY_CARE_PROVIDER_SITE_OTHER): Payer: Self-pay | Admitting: General Surgery

## 2013-01-06 ENCOUNTER — Ambulatory Visit (INDEPENDENT_AMBULATORY_CARE_PROVIDER_SITE_OTHER): Payer: Medicare Other | Admitting: General Surgery

## 2013-01-06 ENCOUNTER — Other Ambulatory Visit: Payer: Self-pay

## 2013-01-06 VITALS — BP 108/76 | HR 95 | Temp 96.4°F | Resp 16 | Ht 70.0 in | Wt 188.0 lb

## 2013-01-06 DIAGNOSIS — M25519 Pain in unspecified shoulder: Secondary | ICD-10-CM

## 2013-01-06 DIAGNOSIS — L02219 Cutaneous abscess of trunk, unspecified: Secondary | ICD-10-CM

## 2013-01-06 DIAGNOSIS — M25512 Pain in left shoulder: Secondary | ICD-10-CM

## 2013-01-06 MED ORDER — METOPROLOL TARTRATE 25 MG PO TABS: 25.0000 mg | ORAL_TABLET | Freq: Two times a day (BID) | ORAL | Status: DC

## 2013-01-06 NOTE — Progress Notes (Signed)
Subjective:     Patient ID: Christian Maxwell, male   DOB: 02-06-1933, 79 y.o.   MRN: [id number]  HPI Patient is for followup of I&D of left colon. Patient also has some ongoing drainage from suprapubic region.  Review of Systems     Objective:   Physical Exam Left arm has some residual edema but no undrained collection, no erythema or cellulitis. He continues to have a small draining sinus right at the base of his penis. Fluid is clear. Granulation tissue persists clear. No ongoing cellulitis or fluctuance. No apparent undrained collection.    Assessment:     Ongoing suprapubic drainage, significant improvement from left arm status post incision and drainage- Cultures there were negative    Plan:     Continue local wound care. Infectious disease consultation is planned for this Friday at [facility]. Hopefully they will get some answers regarding the chronicity of this suprapubic drainage.Plan was discussed in detail the patient and I spoke with his wife on the phone.

## 2013-01-08 ENCOUNTER — Ambulatory Visit (INDEPENDENT_AMBULATORY_CARE_PROVIDER_SITE_OTHER): Payer: Medicare Other | Admitting: Pharmacist

## 2013-01-08 DIAGNOSIS — I4891 Unspecified atrial fibrillation: Secondary | ICD-10-CM | POA: Diagnosis not present

## 2013-01-08 LAB — POCT INR: INR: 2.5

## 2013-01-10 DIAGNOSIS — L0291 Cutaneous abscess, unspecified: Secondary | ICD-10-CM | POA: Diagnosis not present

## 2013-01-14 ENCOUNTER — Telehealth (INDEPENDENT_AMBULATORY_CARE_PROVIDER_SITE_OTHER): Payer: Self-pay | Admitting: *Deleted

## 2013-01-14 ENCOUNTER — Other Ambulatory Visit (INDEPENDENT_AMBULATORY_CARE_PROVIDER_SITE_OTHER): Payer: Self-pay | Admitting: General Surgery

## 2013-01-14 NOTE — Telephone Encounter (Signed)
Doxycycline refilled for 100mg  BID for the next 14 days for patient per Dr. [name].  Dr. [name] states he will try to get the Infectious Diseases notes from this past Friday to determine the future appropriate plan for patient however at this time it is appropriate to refill Doxycycline.  Prescription refilled and escribed to patient's pharmacy.

## 2013-01-17 ENCOUNTER — Ambulatory Visit (INDEPENDENT_AMBULATORY_CARE_PROVIDER_SITE_OTHER): Payer: Medicare Other | Admitting: Surgery

## 2013-01-17 ENCOUNTER — Encounter (INDEPENDENT_AMBULATORY_CARE_PROVIDER_SITE_OTHER): Payer: Self-pay | Admitting: Surgery

## 2013-01-17 ENCOUNTER — Telehealth (INDEPENDENT_AMBULATORY_CARE_PROVIDER_SITE_OTHER): Payer: Self-pay

## 2013-01-17 VITALS — BP 138/64 | HR 84 | Temp 98.2°F | Resp 14 | Ht 70.0 in | Wt 190.0 lb

## 2013-01-17 DIAGNOSIS — Z09 Encounter for follow-up examination after completed treatment for conditions other than malignant neoplasm: Secondary | ICD-10-CM

## 2013-01-17 NOTE — Telephone Encounter (Signed)
I am stuck at [facility] today and cannot see him. I really wish he would come to the urgent office this afternoon. The suprapubic area is chronic but his arm should be checked.

## 2013-01-17 NOTE — Telephone Encounter (Signed)
I called the patient's wife back and let her know Dr Carollee Massed advice.  Appointment was made for today at 3:30 with Dr Daphine Deutscher to check his arm.

## 2013-01-17 NOTE — Telephone Encounter (Signed)
Pt and his wife called [id number] swelling of left arm. I offered appt today with our urgent office. Pt declined. He only wanted to see Dr [name]. I advised pt Dr [name] was at hospital today and not in office. Pt still declined appt and requested to speak with Huntley Dec Dr Carollee Massed nurse. Huntley Dec advised and will return pts call.

## 2013-01-17 NOTE — Patient Instructions (Signed)
Followup with Dr. [name] next week.

## 2013-01-17 NOTE — Progress Notes (Signed)
URGENT Office Christian Maxwell 79 y.o.  Body mass index is 27.26 kg/(m^2).  Patient Active Problem List   Diagnosis Date Noted  . Infected sebaceous cyst 12/25/2012  . MRSA colonization 12/12/2012  . Nevus of sternal region 12/04/2012  . Left anterior shoulder pain 10/22/2012  . Seasonal allergic rhinitis 10/22/2012  . Thyroid disease   . Suprapubic abscess 08/21/2012  . H/O colon cancer, stage I 06/24/2012  . Adenomatous colon polyp 06/24/2012  . Open wound 03/11/2012  . Postoperative stitch abscess 12/27/2011  . Hematoma, postoperative 12/12/2011  . Dyspnea 06/08/2011  . Chest pain 04/21/2011  . Dizziness 04/21/2011  . Hyperlipidemia 10/14/2010  . Atrial fibrillation 08/08/2010  . INSOMNIA, MILD 12/12/2006    No Known Allergies  Past Surgical History  Procedure Laterality Date  . Lumbar laminectomy  1977  . Wound exploration  2006    For possible stitch abscess Dr [name]  . Incise and drain abcess  2009,2010,2011,2012  . Hernia repair  09/2002    DB  . Colectomy  2000    6 inches removed  . Mass excision  03/07/2012    EXCISION MASS;  Surgeon: [name], MD;  Laterality: Right;  removal mass posterior right arm  . Polypectomy    . Colonoscopy  2012    per patient (Dr. Arlyce Dice)  . Incision and drainage abscess N/A 09/20/2012    Procedure: INCISION AND DRAINAGE SUPRAPUBIC ABSCESS;  Surgeon: [name], MD;  Location: [facility];  Service: General;  Laterality: N/A;   Eustaquio Boyden, MD No diagnosis found.  The patient has been waiting in the urgent office to be seen and I saw him in 1 over his chart and went over him. He was concerned about this 2 cm knot on his left arm. This is not red, not hot, and not tender to my manipulation.  This is opposite than other side on the right arm that he had previously were dark concentrated drain and infected area. This has been previously drained and it looks like he may have bled into this area and it  may be an area of induration. Its firmness and its lack of tenderness would almost suggested it could be a neoplasm. He is on Coumadin and I don't think it would be wise to try to biopsy it in the office. The results back from [facility] and [facility] have been inconclusive. I'll discuss this with Dr. [name] and would like her to get back to see him next week. No charge for today's visit Matt B. Daphine Deutscher, MD, [facility] [phone] beeper [phone]  01/17/2013 5:16 PM

## 2013-01-17 NOTE — Telephone Encounter (Signed)
I called and spoke to both the wife and patient.  He is still having trouble with his suprapubic abscess draining and there is a knot.  When she pushes on it, it will drain bloody clear drainage.  He has no fever.  His arm is still swollen, red and sore.  He is not sure if it needs to be drained.  He went to [facility] last week and saw the Infectious disease MD.  She did not tell them anything they haven't already been told.  They want to see Dr [name] and not get involved with another dr today in urgent clinic.  I will have to discuss with Dr [name] since he is not in the office today.

## 2013-01-24 ENCOUNTER — Encounter (INDEPENDENT_AMBULATORY_CARE_PROVIDER_SITE_OTHER): Payer: Medicare Other | Admitting: General Surgery

## 2013-01-24 ENCOUNTER — Encounter (INDEPENDENT_AMBULATORY_CARE_PROVIDER_SITE_OTHER): Payer: Self-pay | Admitting: General Surgery

## 2013-01-24 ENCOUNTER — Ambulatory Visit (INDEPENDENT_AMBULATORY_CARE_PROVIDER_SITE_OTHER): Payer: Medicare Other | Admitting: General Surgery

## 2013-01-24 ENCOUNTER — Other Ambulatory Visit (INDEPENDENT_AMBULATORY_CARE_PROVIDER_SITE_OTHER): Payer: Self-pay | Admitting: General Surgery

## 2013-01-24 VITALS — BP 136/62 | HR 78 | Temp 96.7°F | Resp 14 | Ht 70.0 in | Wt 189.8 lb

## 2013-01-24 DIAGNOSIS — IMO0002 Reserved for concepts with insufficient information to code with codable children: Secondary | ICD-10-CM | POA: Diagnosis not present

## 2013-01-24 NOTE — Progress Notes (Signed)
Subjective:     Patient ID: Christian Maxwell, male   DOB: 04/11/1933, 79 y.o.   MRN: [id number]  HPIHe presents to the urgent office complaining of some pain and drainage from her previous incision and drainage site of the left upper arm. He said it is draining some thick bloody fluid.He has been taking doxycycline for another type of infection.  He takes chronic Coumadin.   Review of Systems     Objective:   Physical Exam Gen.-elderly male in no acute distress.  Left upper arm-area of cellulitis and induration surrounding a small incision. There is purulent drainage coming from the small incision. I. Carefully anesthetized the area with 1% Xylocaine and then using a hemostat dilated the wound up and allowed for evacuation of purulent fluid. I subsequently packed with this with iodoform gauze.      Assessment:     Recurrent left upper arm abscess that was drained.     Plan:     Return to the office Monday for packing removal and wound check. Continue doxycycline. I told him he should have his INR checked since he's been taking an antibiotic.

## 2013-01-24 NOTE — Patient Instructions (Signed)
Do not get the bandage wet.  Have you INR checked Monday.

## 2013-01-27 ENCOUNTER — Ambulatory Visit (INDEPENDENT_AMBULATORY_CARE_PROVIDER_SITE_OTHER): Payer: Medicare Other | Admitting: General Surgery

## 2013-01-27 ENCOUNTER — Encounter (INDEPENDENT_AMBULATORY_CARE_PROVIDER_SITE_OTHER): Payer: Self-pay | Admitting: General Surgery

## 2013-01-27 ENCOUNTER — Telehealth (INDEPENDENT_AMBULATORY_CARE_PROVIDER_SITE_OTHER): Payer: Self-pay

## 2013-01-27 VITALS — BP 110/68 | HR 62 | Resp 16 | Ht 70.0 in | Wt 191.0 lb

## 2013-01-27 DIAGNOSIS — IMO0002 Reserved for concepts with insufficient information to code with codable children: Secondary | ICD-10-CM

## 2013-01-27 NOTE — Telephone Encounter (Signed)
Pt requesting pain medication.  Dr. Abbey Chatters authorized Tramadol 50mg  1 q 4 hrs prn pain, #30 with no refills.  Medication called to [facility] [phone], and the patient is aware.

## 2013-01-27 NOTE — Progress Notes (Signed)
Subjective:   Patient ID: Christian Maxwell, male DOB: May 23, 1932, 79 y.o. MRN: [id number]  HPI: He presents to the urgent office after I&D of L arm abscess on Fri.     Objective:   Physical Exam  Gen.-elderly male in no acute distress.  Left upper arm- no cellulitis or induration surrounding a small incision. There is purulent drainage coming from the incision.  Packing removed.  Purulence expressed.   Assessment:   Recurrent left upper arm abscess that was drained. Packing changed  Plan:   Return to the office Wed for packing removal and wound check with Dr [name]. Continue doxycycline.

## 2013-01-27 NOTE — Patient Instructions (Signed)
Change dressing daily.  Return to office on Wed

## 2013-01-29 ENCOUNTER — Ambulatory Visit (INDEPENDENT_AMBULATORY_CARE_PROVIDER_SITE_OTHER): Payer: Medicare Other | Admitting: General Surgery

## 2013-01-29 VITALS — BP 110/60 | HR 78 | Resp 18 | Ht 70.0 in | Wt 189.4 lb

## 2013-01-29 DIAGNOSIS — IMO0002 Reserved for concepts with insufficient information to code with codable children: Secondary | ICD-10-CM

## 2013-01-29 MED ORDER — DOXYCYCLINE HYCLATE 50 MG PO CAPS: 100.0000 mg | ORAL_CAPSULE | Freq: Two times a day (BID) | ORAL | Status: DC

## 2013-01-29 NOTE — Patient Instructions (Signed)
Keep both areas covered with bandage, change daily or as needed. I will call you once I discuss things with the infectious disease physician.

## 2013-01-29 NOTE — Progress Notes (Signed)
Subjective:     Patient ID: Christian Maxwell, male   DOB: Apr 20, 1933, 79 y.o.   MRN: [id number]  HPI Patient returns status post incision and drainage of recurrent abscess left arm. He continues to have mild drainage from his chronic suprapubic abscess area. He was evaluated by infectious disease at [facility]. Blood cultures there were negative.  Review of Systems     Objective:   Physical Exam Left arm cellulitis has resolved, however with manipulation of the area some purulent fluid was expressed. Wound was cleaned out with a cotton tip applicator with hydrogen peroxide. Further drainage was obtained. This continued until no further fluid was retained. A dressing was applied.  The suprapubic area appears smaller with a very tiny hole and minimal drainage. No fluctuance or cellulitis. Appears improved.    Assessment:     Recurrent left arm abscess status post incision and drainage. This was further drained as above. Will need to refill antibiotics. Suprapubic abscess appears improved but has not completely resolved.    Plan:     Refill doxycycline. Previous scan did not show any osteomyelitis of the symphysis pubis. We may need to recheck that. Consideration for bone scan or other test was recommended by infectious disease at [facility]. I will discuss with Dr. Algis Liming from infectious disease here who  the patient saw previously. If this will be helpful we'll plan to do that prior to his next visit. See him next week.

## 2013-01-30 ENCOUNTER — Other Ambulatory Visit: Payer: Self-pay | Admitting: General Surgery

## 2013-01-30 ENCOUNTER — Telehealth (INDEPENDENT_AMBULATORY_CARE_PROVIDER_SITE_OTHER): Payer: Self-pay | Admitting: General Surgery

## 2013-01-30 ENCOUNTER — Other Ambulatory Visit (INDEPENDENT_AMBULATORY_CARE_PROVIDER_SITE_OTHER): Payer: Self-pay | Admitting: General Surgery

## 2013-01-30 DIAGNOSIS — L02219 Cutaneous abscess of trunk, unspecified: Secondary | ICD-10-CM

## 2013-01-30 NOTE — Telephone Encounter (Signed)
Spoke with pt and informed him that we have him set up for an MRI of his pelvis to r/o osteo.  Explained that this will be done at [facility] radiology on 02/10/13 with an arrival time of 9:45.  Explained that he must be NPO 4 hours before the test and that they will draw some blood work before the test begins.

## 2013-02-03 DIAGNOSIS — E785 Hyperlipidemia, unspecified: Secondary | ICD-10-CM | POA: Diagnosis not present

## 2013-02-03 DIAGNOSIS — E0500 Thyrotoxicosis with diffuse goiter without thyrotoxic crisis or storm: Secondary | ICD-10-CM | POA: Diagnosis not present

## 2013-02-03 DIAGNOSIS — R413 Other amnesia: Secondary | ICD-10-CM | POA: Diagnosis not present

## 2013-02-03 DIAGNOSIS — Z1331 Encounter for screening for depression: Secondary | ICD-10-CM | POA: Diagnosis not present

## 2013-02-03 DIAGNOSIS — L0291 Cutaneous abscess, unspecified: Secondary | ICD-10-CM | POA: Diagnosis not present

## 2013-02-03 DIAGNOSIS — C189 Malignant neoplasm of colon, unspecified: Secondary | ICD-10-CM | POA: Diagnosis not present

## 2013-02-03 DIAGNOSIS — I4891 Unspecified atrial fibrillation: Secondary | ICD-10-CM | POA: Diagnosis not present

## 2013-02-04 ENCOUNTER — Ambulatory Visit (INDEPENDENT_AMBULATORY_CARE_PROVIDER_SITE_OTHER): Payer: Medicare Other

## 2013-02-04 DIAGNOSIS — I4891 Unspecified atrial fibrillation: Secondary | ICD-10-CM | POA: Diagnosis not present

## 2013-02-04 LAB — POCT INR: INR: 2.7

## 2013-02-05 ENCOUNTER — Ambulatory Visit (INDEPENDENT_AMBULATORY_CARE_PROVIDER_SITE_OTHER): Payer: Medicare Other | Admitting: General Surgery

## 2013-02-05 ENCOUNTER — Encounter (INDEPENDENT_AMBULATORY_CARE_PROVIDER_SITE_OTHER): Payer: Medicare Other | Admitting: General Surgery

## 2013-02-05 ENCOUNTER — Encounter (INDEPENDENT_AMBULATORY_CARE_PROVIDER_SITE_OTHER): Payer: Self-pay | Admitting: General Surgery

## 2013-02-05 VITALS — BP 110/66 | HR 68 | Temp 97.9°F | Resp 14 | Ht 70.0 in | Wt 190.6 lb

## 2013-02-05 DIAGNOSIS — IMO0002 Reserved for concepts with insufficient information to code with codable children: Secondary | ICD-10-CM | POA: Diagnosis not present

## 2013-02-05 NOTE — Progress Notes (Signed)
Subjective:     Patient ID: Christian Maxwell, male   DOB: [birth year], 79 y.o.   MRN: [id number]  HPI Patient is followup of left arm abscess and chronic suprapubic abscess. He has increased pain in his arm site.  Review of Systems     Objective:   Physical Exam Suprapubic area has gotten very small. There is minimal pink drainage. No fluctuance or cellulitis. Left arm skin has closed up but there is a central fluctuant area with erythema Incision and drainage: Area was prepped in a sterile fashion. Local anesthetic was injected. A linear incision was made with prompt evacuation of purulent fluid. A core of skin was taken out to help alleviate open longer. Wound was thoroughly irrigated. Quarter-inch form gauze was placed followed by plain gauze. There was good hemostasis.    Assessment:     Recurrent left arm abscess, chronic suprapubic drainage    Plan:     Incision and drainage of left arm abscess as above. Continue doxycycline. Patient is undergoing MR of the pelvis to evaluate for osteomyelitis per infectious disease recommendations. I will see him next week. Wound care instructions were given.

## 2013-02-05 NOTE — Patient Instructions (Signed)
Change gauze as needed. Remove packing on Friday. Keep area covered.

## 2013-02-06 ENCOUNTER — Encounter: Payer: Self-pay | Admitting: Family Medicine

## 2013-02-07 ENCOUNTER — Telehealth (INDEPENDENT_AMBULATORY_CARE_PROVIDER_SITE_OTHER): Payer: Self-pay | Admitting: General Surgery

## 2013-02-07 ENCOUNTER — Encounter (INDEPENDENT_AMBULATORY_CARE_PROVIDER_SITE_OTHER): Payer: Self-pay

## 2013-02-07 NOTE — Telephone Encounter (Signed)
Pt called to request appt for wound check.  He needs to unpack the wound today.  Instructed pt and his wife that he needs to moisten the packing with warm water dripped over it, then gently remove the packing.  He should stand in the shower and wash the areas with soap and water; do not scrub with wash cloth.  Pat dry after thoroughly rinsing it and cover all with dry dressing.  Do not attempt to repack the wound.  He does not need to see the physician to remove his packing material.  Also, they will be out of town until 02/17/13, so will be unable to attend the scheduled wound check on 02/12/13.  Dr. [name] paged and updated, as he is out of the office until 03/05/13.  He will co-ordinate with Huntley Dec to meet him at the office one day the week of 02/17/13 to check the wound then.  Pt aware.

## 2013-02-07 NOTE — Progress Notes (Signed)
Received a fax from [facility] requesting refill on Doxycycline 50 mg #28.  OK per Dr [name].  I faxed the refill authorization with no additional refills.  Fax # [phone]

## 2013-02-10 ENCOUNTER — Ambulatory Visit (HOSPITAL_COMMUNITY): Admission: RE | Admit: 2013-02-10 | Payer: Medicare Other | Source: Ambulatory Visit

## 2013-02-12 ENCOUNTER — Encounter (INDEPENDENT_AMBULATORY_CARE_PROVIDER_SITE_OTHER): Payer: Medicare Other | Admitting: General Surgery

## 2013-02-14 ENCOUNTER — Other Ambulatory Visit (INDEPENDENT_AMBULATORY_CARE_PROVIDER_SITE_OTHER): Payer: Self-pay | Admitting: *Deleted

## 2013-02-14 DIAGNOSIS — L02219 Cutaneous abscess of trunk, unspecified: Secondary | ICD-10-CM

## 2013-02-17 ENCOUNTER — Other Ambulatory Visit (HOSPITAL_COMMUNITY): Payer: Medicare Other

## 2013-02-17 ENCOUNTER — Other Ambulatory Visit (INDEPENDENT_AMBULATORY_CARE_PROVIDER_SITE_OTHER): Payer: Self-pay | Admitting: General Surgery

## 2013-02-17 ENCOUNTER — Ambulatory Visit (HOSPITAL_COMMUNITY)
Admission: RE | Admit: 2013-02-17 | Discharge: 2013-02-17 | Disposition: A | Discharge status: Home / Self Care | Payer: Medicare Other | Source: Ambulatory Visit | Attending: General Surgery | Admitting: General Surgery

## 2013-02-17 DIAGNOSIS — L02219 Cutaneous abscess of trunk, unspecified: Secondary | ICD-10-CM | POA: Diagnosis not present

## 2013-02-17 DIAGNOSIS — L905 Scar conditions and fibrosis of skin: Secondary | ICD-10-CM | POA: Diagnosis not present

## 2013-02-17 DIAGNOSIS — L02419 Cutaneous abscess of limb, unspecified: Secondary | ICD-10-CM | POA: Diagnosis not present

## 2013-02-17 DIAGNOSIS — M47817 Spondylosis without myelopathy or radiculopathy, lumbosacral region: Secondary | ICD-10-CM | POA: Insufficient documentation

## 2013-02-17 LAB — CREATININE, SERUM
Creatinine, Ser: 0.77 mg/dL (ref 0.50–1.35)
GFR calc non Af Amer: 85 mL/min — ABNORMAL LOW (ref >90)

## 2013-02-24 ENCOUNTER — Encounter (INDEPENDENT_AMBULATORY_CARE_PROVIDER_SITE_OTHER): Payer: Medicare Other | Admitting: General Surgery

## 2013-02-25 ENCOUNTER — Ambulatory Visit: Payer: Medicare Other | Admitting: Family Medicine

## 2013-02-25 ENCOUNTER — Encounter (INDEPENDENT_AMBULATORY_CARE_PROVIDER_SITE_OTHER): Payer: Self-pay | Admitting: General Surgery

## 2013-02-25 ENCOUNTER — Encounter (INDEPENDENT_AMBULATORY_CARE_PROVIDER_SITE_OTHER): Payer: Self-pay

## 2013-02-25 ENCOUNTER — Ambulatory Visit (INDEPENDENT_AMBULATORY_CARE_PROVIDER_SITE_OTHER): Payer: Medicare Other | Admitting: General Surgery

## 2013-02-25 VITALS — BP 118/70 | HR 78 | Temp 96.3°F | Ht 70.0 in | Wt 190.2 lb

## 2013-02-25 DIAGNOSIS — Z0289 Encounter for other administrative examinations: Secondary | ICD-10-CM

## 2013-02-25 DIAGNOSIS — IMO0002 Reserved for concepts with insufficient information to code with codable children: Secondary | ICD-10-CM

## 2013-02-25 NOTE — Patient Instructions (Signed)
Hold coumadin.  Go to pre op tomorrow.

## 2013-02-26 ENCOUNTER — Other Ambulatory Visit (INDEPENDENT_AMBULATORY_CARE_PROVIDER_SITE_OTHER): Payer: Self-pay | Admitting: General Surgery

## 2013-02-26 ENCOUNTER — Other Ambulatory Visit (HOSPITAL_COMMUNITY): Payer: Self-pay | Admitting: *Deleted

## 2013-02-26 ENCOUNTER — Encounter (HOSPITAL_COMMUNITY): Payer: Self-pay | Admitting: *Deleted

## 2013-02-26 MED ORDER — CEFAZOLIN SODIUM-DEXTROSE 2-3 GM-% IV SOLR: 2.0000 g | INTRAVENOUS | Status: DC

## 2013-02-26 NOTE — Progress Notes (Signed)
Consent order does not have the laterality on it. Called Dr. Jacinto Halim office and spoke with a nurse and she stated that she would send a message to Dr. Derrell Lolling.

## 2013-02-26 NOTE — Assessment & Plan Note (Signed)
Patient has recurrent left upper arm abscess.  Due to his history of bleeding and Coumadin therapy, I think this would be better served to operating room. It's also quite large.  I discussed this with Dr. Derrell Lolling and Dr. [name]. Dr. [name] has seen the patient the most and does agree this would likely benefit from operative therapy.  He is scheduled for an incision and drainage of the left upper arm abscess on Thursday.   If his INR is too high, he may have to get delayed until Friday.

## 2013-02-26 NOTE — Progress Notes (Signed)
Subjective:     Patient ID: Christian Maxwell, male   DOB: 09/18/1932, 80 y.o.   MRN: [id number]  HPI The patient is a 80-year-old male has a long history of multiple abscesses that have required repeated drainage.  He most recently had a suprapubic abscess drained in the spring. His left arm abscesses required multiple office and operative procedures. He presents with a recurrent left upper arm abscess. He normally has a sinus tract that as long as it stays open, the arm is not enlarged or painful. However, this sinus tract closed off and the arm abscess swelled up and became painful. He also developed some redness over the top.  Review of Systems  All other systems reviewed and are negative.       Objective:   Physical Exam  Constitutional: He is oriented to person, place, and time. He appears well-developed and well-nourished. No distress.  HENT:  Head: Normocephalic and atraumatic.  Eyes: Conjunctivae are normal. Pupils are equal, round, and reactive to light.  Cardiovascular: Normal rate.   Pulmonary/Chest: Effort normal. No respiratory distress.  Abdominal: Soft. He exhibits no distension. There is no tenderness.    Small firm area in lower midline wound.  Sinus tract with minimal drainage just above pubis.    Genitourinary: Penis normal.  Musculoskeletal: Normal range of motion.  Neurological: He is alert and oriented to person, place, and time.  Skin: Skin is warm and dry. No rash noted. He is not diaphoretic. No pallor.     4x5 cm fluctuant mass with mild cellulitis overlying.  Small scab in center where sinus tract was.    Psychiatric: He has a normal mood and affect. His behavior is normal. Judgment and thought content normal.       Assessment/Plan:    Cellulitis and abscess of upper arm and forearm-left, recurrent. Patient has recurrent left upper arm abscess.  Due to his history of bleeding and Coumadin therapy, I think this would be better served to operating room.  It's also quite large.  I discussed this with Dr. Ramirez and Dr. Thompson. Dr. Thompson has seen the patient the most and does agree this would likely benefit from operative therapy.  He is scheduled for an incision and drainage of the left upper arm abscess on Thursday.   If his INR is too high, he may have to get delayed until Friday.           

## 2013-02-27 ENCOUNTER — Ambulatory Visit (HOSPITAL_COMMUNITY)
Admission: RE | Admit: 2013-02-27 | Discharge: 2013-02-27 | Disposition: A | Discharge status: Home / Self Care | Payer: Medicare Other | Source: Ambulatory Visit | Attending: General Surgery | Admitting: General Surgery

## 2013-02-27 ENCOUNTER — Encounter (HOSPITAL_COMMUNITY)
Admission: RE | Disposition: A | Discharge status: Home / Self Care | Payer: Self-pay | Source: Ambulatory Visit | Attending: General Surgery

## 2013-02-27 ENCOUNTER — Ambulatory Visit (HOSPITAL_COMMUNITY): Payer: Medicare Other

## 2013-02-27 ENCOUNTER — Encounter (HOSPITAL_COMMUNITY): Payer: Self-pay | Admitting: *Deleted

## 2013-02-27 ENCOUNTER — Other Ambulatory Visit (INDEPENDENT_AMBULATORY_CARE_PROVIDER_SITE_OTHER): Payer: Self-pay | Admitting: General Surgery

## 2013-02-27 ENCOUNTER — Encounter (HOSPITAL_COMMUNITY): Payer: Self-pay | Admitting: Anesthesiology

## 2013-02-27 DIAGNOSIS — Z01818 Encounter for other preprocedural examination: Secondary | ICD-10-CM | POA: Insufficient documentation

## 2013-02-27 DIAGNOSIS — I10 Essential (primary) hypertension: Secondary | ICD-10-CM | POA: Insufficient documentation

## 2013-02-27 DIAGNOSIS — Z538 Procedure and treatment not carried out for other reasons: Secondary | ICD-10-CM | POA: Insufficient documentation

## 2013-02-27 DIAGNOSIS — IMO0002 Reserved for concepts with insufficient information to code with codable children: Secondary | ICD-10-CM | POA: Insufficient documentation

## 2013-02-27 DIAGNOSIS — Z01812 Encounter for preprocedural laboratory examination: Secondary | ICD-10-CM | POA: Insufficient documentation

## 2013-02-27 HISTORY — DX: Acute myocardial infarction, unspecified: I21.9

## 2013-02-27 LAB — CBC
Hemoglobin: 15.1 g/dL (ref 13.0–17.0)
MCH: 28.8 pg (ref 26.0–34.0)
MCHC: 33.3 g/dL (ref 30.0–36.0)
Platelets: 167 10*3/uL (ref 150–400)
RDW: 12.8 % (ref 11.5–15.5)

## 2013-02-27 LAB — PROTIME-INR: Prothrombin Time: 22.8 seconds — ABNORMAL HIGH (ref 11.6–15.2)

## 2013-02-27 LAB — BASIC METABOLIC PANEL
BUN: 16 mg/dL (ref 6–23)
Calcium: 9.9 mg/dL (ref 8.4–10.5)
Creatinine, Ser: 0.79 mg/dL (ref 0.50–1.35)
GFR calc Af Amer: >90 mL/min (ref >90)
GFR calc non Af Amer: 84 mL/min — ABNORMAL LOW (ref >90)
Glucose, Bld: 104 mg/dL — ABNORMAL HIGH (ref 70–99)

## 2013-02-27 SURGERY — INCISION AND DRAINAGE, ABSCESS
Anesthesia: General | Laterality: Left

## 2013-02-27 MED ORDER — LACTATED RINGERS IV SOLN
INTRAVENOUS | Status: DC
  Administered 2013-02-27: 09:00:00 via INTRAVENOUS

## 2013-02-27 MED ORDER — DEXTROSE 5 % IV SOLN
2.0000 g | INTRAVENOUS | Status: DC
  Filled 2013-02-27: qty 20

## 2013-02-27 MED ORDER — CEFAZOLIN SODIUM-DEXTROSE 2-3 GM-% IV SOLR
2.0000 g | INTRAVENOUS | Status: DC
  Filled 2013-02-27 (×2): qty 50

## 2013-02-27 SURGICAL SUPPLY — 23 items
BANDAGE GAUZE ELAST BULKY 4 IN (GAUZE/BANDAGES/DRESSINGS) IMPLANT
CANISTER SUCTION 2500CC (MISCELLANEOUS) ×2 IMPLANT
COVER SURGICAL LIGHT HANDLE (MISCELLANEOUS) ×2 IMPLANT
DRAPE LAPAROSCOPIC ABDOMINAL (DRAPES) ×2 IMPLANT
DRAPE UTILITY 15X26 W/TAPE STR (DRAPE) ×4 IMPLANT
DRSG PAD ABDOMINAL 8X10 ST (GAUZE/BANDAGES/DRESSINGS) ×2 IMPLANT
ELECT CAUTERY BLADE 6.4 (BLADE) ×2 IMPLANT
ELECT REM PT RETURN 9FT ADLT (ELECTROSURGICAL) ×2
ELECTRODE REM PT RTRN 9FT ADLT (ELECTROSURGICAL) ×1 IMPLANT
GLOVE BIO SURGEON STRL SZ7.5 (GLOVE) ×2 IMPLANT
GLOVE BIOGEL PI IND STRL 8 (GLOVE) ×1 IMPLANT
GLOVE BIOGEL PI INDICATOR 8 (GLOVE) ×1
GOWN STRL NON-REIN LRG LVL3 (GOWN DISPOSABLE) ×4 IMPLANT
KIT BASIN OR (CUSTOM PROCEDURE TRAY) ×2 IMPLANT
KIT ROOM TURNOVER OR (KITS) ×2 IMPLANT
NS IRRIG 1000ML POUR BTL (IV SOLUTION) ×2 IMPLANT
PACK GENERAL/GYN (CUSTOM PROCEDURE TRAY) ×2 IMPLANT
PAD ARMBOARD 7.5X6 YLW CONV (MISCELLANEOUS) ×2 IMPLANT
SPONGE GAUZE 4X4 12PLY (GAUZE/BANDAGES/DRESSINGS) ×2 IMPLANT
SWAB COLLECTION DEVICE MRSA (MISCELLANEOUS) IMPLANT
TOWEL OR 17X24 6PK STRL BLUE (TOWEL DISPOSABLE) ×2 IMPLANT
TOWEL OR 17X26 10 PK STRL BLUE (TOWEL DISPOSABLE) ×2 IMPLANT
TUBE ANAEROBIC SPECIMEN COL (MISCELLANEOUS) IMPLANT

## 2013-02-27 NOTE — Pre-Procedure Instructions (Signed)
Christian Maxwell  02/27/2013   Your procedure is scheduled on:  02-28-2013  Friday   Report to [facility] Short Stay [facility]  2 * 3 at 5:30 AM.   Call this number if you have problems the morning of surgery: [phone]   Remember:   Do not eat food or drink liquids after midnight.    Take these medicines the morning of surgery with A SIP OF WATER: metoprolol(Lopressor),   Do not wear jewelry,   Do not wear lotions, powders, or perfumes.   Do not shave 48 hours prior to surgery. Men may shave face and neck.  Do not bring valuables to the hospital.  [facility] is not responsible  for any belongings or valuables.               Contacts, dentures or bridgework may not be worn into surgery.                 Patients discharged the day of surgery will not be allowed to drive home   Name and phone number of your driver: wife

## 2013-02-27 NOTE — Anesthesia Preprocedure Evaluation (Deleted)
Anesthesia Evaluation  Patient identified by MRN, date of birth, ID band Patient awake    Reviewed: Allergy & Precautions, H&P , NPO status , Patient's Chart, lab work & pertinent test results  Airway       Dental   Pulmonary          Cardiovascular hypertension, + Past MI + dysrhythmias Atrial Fibrillation     Neuro/Psych    GI/Hepatic   Endo/Other    Renal/GU      Musculoskeletal   Abdominal   Peds  Hematology   Anesthesia Other Findings   Reproductive/Obstetrics                           Anesthesia Physical Anesthesia Plan  ASA: III  Anesthesia Plan: General   Post-op Pain Management:    Induction: Intravenous  Airway Management Planned: Oral ETT  Additional Equipment:   Intra-op Plan:   Post-operative Plan: Extubation in OR  Informed Consent: I have reviewed the patients History and Physical, chart, labs and discussed the procedure including the risks, benefits and alternatives for the proposed anesthesia with the patient or authorized representative who has indicated his/her understanding and acceptance.     Plan Discussed with: CRNA, Anesthesiologist and Surgeon  Anesthesia Plan Comments:         Anesthesia Quick Evaluation

## 2013-02-27 NOTE — Progress Notes (Signed)
Pt here this AM for I&D of L UE abscess.  Pt prev on coumadin.  This AM's INR still elevated at 2.05.  We will transfuse him 2U of FFP today and bring him back in AM for I&D.  He would like to go home and not stay in the hospital.

## 2013-02-28 ENCOUNTER — Encounter (HOSPITAL_COMMUNITY): Payer: Medicare Other | Admitting: Anesthesiology

## 2013-02-28 ENCOUNTER — Ambulatory Visit (HOSPITAL_COMMUNITY)
Admission: RE | Admit: 2013-02-28 | Discharge: 2013-02-28 | Disposition: A | Discharge status: Home / Self Care | Payer: Medicare Other | Source: Ambulatory Visit | Attending: General Surgery | Admitting: General Surgery

## 2013-02-28 ENCOUNTER — Encounter (HOSPITAL_COMMUNITY): Payer: Self-pay | Admitting: Anesthesiology

## 2013-02-28 ENCOUNTER — Encounter (HOSPITAL_COMMUNITY)
Admission: RE | Disposition: A | Discharge status: Home / Self Care | Payer: Self-pay | Source: Ambulatory Visit | Attending: General Surgery

## 2013-02-28 ENCOUNTER — Ambulatory Visit (HOSPITAL_COMMUNITY): Payer: Medicare Other | Admitting: Anesthesiology

## 2013-02-28 DIAGNOSIS — IMO0002 Reserved for concepts with insufficient information to code with codable children: Secondary | ICD-10-CM | POA: Insufficient documentation

## 2013-02-28 DIAGNOSIS — J449 Chronic obstructive pulmonary disease, unspecified: Secondary | ICD-10-CM | POA: Diagnosis not present

## 2013-02-28 DIAGNOSIS — I10 Essential (primary) hypertension: Secondary | ICD-10-CM | POA: Diagnosis not present

## 2013-02-28 DIAGNOSIS — Z7901 Long term (current) use of anticoagulants: Secondary | ICD-10-CM | POA: Diagnosis not present

## 2013-02-28 DIAGNOSIS — I4891 Unspecified atrial fibrillation: Secondary | ICD-10-CM | POA: Diagnosis not present

## 2013-02-28 DIAGNOSIS — J4489 Other specified chronic obstructive pulmonary disease: Secondary | ICD-10-CM | POA: Insufficient documentation

## 2013-02-28 HISTORY — PX: INCISION AND DRAINAGE ABSCESS: SHX5864

## 2013-02-28 LAB — PREPARE FRESH FROZEN PLASMA: Unit division: 0

## 2013-02-28 LAB — PROTIME-INR: Prothrombin Time: 16.6 seconds — ABNORMAL HIGH (ref 11.6–15.2)

## 2013-02-28 SURGERY — INCISION AND DRAINAGE, ABSCESS
Anesthesia: General | Site: Arm Upper | Laterality: Left | Wound class: Dirty or Infected

## 2013-02-28 MED ORDER — FENTANYL CITRATE 0.05 MG/ML IJ SOLN
INTRAMUSCULAR | Status: DC | PRN
  Administered 2013-02-28: 25 ug via INTRAVENOUS
  Administered 2013-02-28: 50 ug via INTRAVENOUS

## 2013-02-28 MED ORDER — 0.9 % SODIUM CHLORIDE (POUR BTL) OPTIME
TOPICAL | Status: DC | PRN
  Administered 2013-02-28: 1000 mL

## 2013-02-28 MED ORDER — MIDAZOLAM HCL 2 MG/2ML IJ SOLN: 1.0000 mg | INTRAMUSCULAR | Status: DC | PRN

## 2013-02-28 MED ORDER — ONDANSETRON HCL 4 MG/2ML IJ SOLN
INTRAMUSCULAR | Status: DC | PRN
  Administered 2013-02-28: 4 mg via INTRAMUSCULAR

## 2013-02-28 MED ORDER — BUPIVACAINE-EPINEPHRINE 0.25% -1:200000 IJ SOLN
INTRAMUSCULAR | Status: DC | PRN
  Administered 2013-02-28: 20 mL

## 2013-02-28 MED ORDER — CEFAZOLIN SODIUM-DEXTROSE 2-3 GM-% IV SOLR
INTRAVENOUS | Status: AC
  Filled 2013-02-28: qty 50

## 2013-02-28 MED ORDER — ARTIFICIAL TEARS OP OINT
TOPICAL_OINTMENT | OPHTHALMIC | Status: DC | PRN
  Administered 2013-02-28: 1 via OPHTHALMIC

## 2013-02-28 MED ORDER — OXYCODONE HCL 5 MG/5ML PO SOLN: 5.0000 mg | Freq: Once | ORAL | Status: DC | PRN

## 2013-02-28 MED ORDER — LACTATED RINGERS IV SOLN
INTRAVENOUS | Status: DC | PRN
  Administered 2013-02-28: 07:00:00 via INTRAVENOUS

## 2013-02-28 MED ORDER — EPHEDRINE SULFATE 50 MG/ML IJ SOLN
INTRAMUSCULAR | Status: DC | PRN
  Administered 2013-02-28: 10 mg via INTRAVENOUS

## 2013-02-28 MED ORDER — LIDOCAINE HCL (CARDIAC) 20 MG/ML IV SOLN
INTRAVENOUS | Status: DC | PRN
  Administered 2013-02-28: 100 mg via INTRAVENOUS

## 2013-02-28 MED ORDER — HYDROMORPHONE HCL PF 1 MG/ML IJ SOLN: 0.2500 mg | INTRAMUSCULAR | Status: DC | PRN

## 2013-02-28 MED ORDER — FENTANYL CITRATE 0.05 MG/ML IJ SOLN: 50.0000 ug | Freq: Once | INTRAMUSCULAR | Status: DC

## 2013-02-28 MED ORDER — OXYCODONE HCL 5 MG PO TABS: 5.0000 mg | ORAL_TABLET | Freq: Once | ORAL | Status: DC | PRN

## 2013-02-28 MED ORDER — OXYCODONE-ACETAMINOPHEN 5-325 MG PO TABS: 1.0000 | ORAL_TABLET | ORAL | Status: DC | PRN

## 2013-02-28 MED ORDER — PROPOFOL 10 MG/ML IV BOLUS
INTRAVENOUS | Status: DC | PRN
  Administered 2013-02-28: 140 mg via INTRAVENOUS

## 2013-02-28 MED ORDER — BUPIVACAINE-EPINEPHRINE PF 0.25-1:200000 % IJ SOLN
INTRAMUSCULAR | Status: AC
  Filled 2013-02-28: qty 30

## 2013-02-28 SURGICAL SUPPLY — 35 items
BANDAGE ELASTIC 4 VELCRO ST LF (GAUZE/BANDAGES/DRESSINGS) ×1 IMPLANT
BANDAGE GAUZE ELAST BULKY 4 IN (GAUZE/BANDAGES/DRESSINGS) IMPLANT
CANISTER SUCTION 2500CC (MISCELLANEOUS) ×2 IMPLANT
COVER SURGICAL LIGHT HANDLE (MISCELLANEOUS) ×2 IMPLANT
DRAPE LAPAROSCOPIC ABDOMINAL (DRAPES) ×2 IMPLANT
DRAPE PROXIMA HALF (DRAPES) ×1 IMPLANT
DRAPE UTILITY 15X26 W/TAPE STR (DRAPE) ×4 IMPLANT
DRSG PAD ABDOMINAL 8X10 ST (GAUZE/BANDAGES/DRESSINGS) ×2 IMPLANT
ELECT CAUTERY BLADE 6.4 (BLADE) ×2 IMPLANT
ELECT REM PT RETURN 9FT ADLT (ELECTROSURGICAL) ×2
ELECTRODE REM PT RTRN 9FT ADLT (ELECTROSURGICAL) ×1 IMPLANT
GAUZE PACKING IODOFORM 1/2 (PACKING) ×2 IMPLANT
GLOVE BIO SURGEON STRL SZ7.5 (GLOVE) ×2 IMPLANT
GLOVE BIOGEL PI IND STRL 7.0 (GLOVE) IMPLANT
GLOVE BIOGEL PI IND STRL 8 (GLOVE) ×1 IMPLANT
GLOVE BIOGEL PI INDICATOR 7.0 (GLOVE) ×1
GLOVE BIOGEL PI INDICATOR 8 (GLOVE) ×1
GLOVE SURG SS PI 6.5 STRL IVOR (GLOVE) ×1 IMPLANT
GLOVE SURG SS PI 7.0 STRL IVOR (GLOVE) ×1 IMPLANT
GOWN STRL NON-REIN LRG LVL3 (GOWN DISPOSABLE) ×3 IMPLANT
GOWN STRL REIN XL XLG (GOWN DISPOSABLE) ×1 IMPLANT
KIT BASIN OR (CUSTOM PROCEDURE TRAY) ×2 IMPLANT
KIT ROOM TURNOVER OR (KITS) ×2 IMPLANT
NDL HYPO 25GX1X1/2 BEV (NEEDLE) IMPLANT
NEEDLE HYPO 25GX1X1/2 BEV (NEEDLE) ×2 IMPLANT
NS IRRIG 1000ML POUR BTL (IV SOLUTION) ×2 IMPLANT
PACK GENERAL/GYN (CUSTOM PROCEDURE TRAY) ×2 IMPLANT
PAD ARMBOARD 7.5X6 YLW CONV (MISCELLANEOUS) ×2 IMPLANT
SPONGE GAUZE 4X4 12PLY (GAUZE/BANDAGES/DRESSINGS) ×2 IMPLANT
SWAB COLLECTION DEVICE MRSA (MISCELLANEOUS) ×2 IMPLANT
SYR CONTROL 10ML LL (SYRINGE) ×1 IMPLANT
TOWEL OR 17X24 6PK STRL BLUE (TOWEL DISPOSABLE) ×1 IMPLANT
TOWEL OR 17X26 10 PK STRL BLUE (TOWEL DISPOSABLE) ×2 IMPLANT
TUBE ANAEROBIC SPECIMEN COL (MISCELLANEOUS) ×2 IMPLANT
UNDERPAD 30X30 INCONTINENT (UNDERPADS AND DIAPERS) ×2 IMPLANT

## 2013-02-28 NOTE — Interval H&P Note (Signed)
History and Physical Interval Note:  02/28/2013 7:28 AM  Christian Maxwell  has presented today for surgery, with the diagnosis of LEFT ARM ABSCESS  The various methods of treatment have been discussed with the patient and family. After consideration of risks, benefits and other options for treatment, the patient has consented to  Procedure(s): INCISION AND DRAINAGE LEFT ARM ABSCESS (Left) as a surgical intervention .  The patient's history has been reviewed, patient examined, no change in status, stable for surgery.  I have reviewed the patient's chart and labs.  Questions were answered to the patient's satisfaction.     Marigene Ehlers., Jed Limerick

## 2013-02-28 NOTE — Anesthesia Postprocedure Evaluation (Signed)
  Anesthesia Post-op Note  Patient: Christian Maxwell  Procedure(s) Performed: Procedure(s): INCISION AND DRAINAGE LEFT ARM ABSCESS (Left)  Patient Location: PACU  Anesthesia Type:General  Level of Consciousness: awake  Airway and Oxygen Therapy: Patient Spontanous Breathing  Post-op Pain: mild  Post-op Assessment: Post-op Vital signs reviewed, Patient's Cardiovascular Status Stable, Respiratory Function Stable, Patent Airway, No signs of Nausea or vomiting and Pain level controlled  Post-op Vital Signs: Reviewed and stable  Complications: No apparent anesthesia complications

## 2013-02-28 NOTE — Transfer of Care (Signed)
Immediate Anesthesia Transfer of Care Note  Patient: Christian Maxwell  Procedure(s) Performed: Procedure(s): INCISION AND DRAINAGE LEFT ARM ABSCESS (Left)  Patient Location: PACU  Anesthesia Type:General  Level of Consciousness: awake, alert  and oriented  Airway & Oxygen Therapy: Patient Spontanous Breathing and Patient connected to face mask oxygen  Post-op Assessment: Report given to PACU RN and Post -op Vital signs reviewed and stable  Post vital signs: Reviewed and stable  Complications: No apparent anesthesia complications

## 2013-02-28 NOTE — Preoperative (Signed)
Beta Blockers   Reason not to administer Beta Blockers:Not Applicable 

## 2013-02-28 NOTE — Op Note (Signed)
Pre Operative Diagnosis:  Left upper extremity abscess  Post Operative Diagnosis: same  Procedure:  Incision and drainage of left upper extremity abscess  Surgeon: Dr. Axel Filler  Assistant: none  Anesthesia: GETA  EBL: 5 cc  Complications: none  Counts: reported as correct x 2  Findings:  The patient had approximately a 4 x 3 cm abscess in the left lateral portion of the bicep. Aerobic and anaerobic cultures were obtained. The area was packed with half-inch gauze.  Indications for procedure:  The patient is a 79 year old male with a several week history of left upper extremity arm abscess. This was seen in clinic secondary to Coumadin use patient was scheduled for operative incision and drainage. The patient required 2 units of FFP to decrease his INR. After checking his arms to the operating room for I&D.  Details of the procedure:The patient was taken back to the operating room. The patient was placed in supine position with bilateral SCDs in place. After appropriate anitbiotics were confirmed, a time-out was confirmed and all facts were verified.  The patient was prepped and draped in the usual sterile fashion. A 3 cm incision was made just over the area of greatest fluctuance. A large amount of purulence was expressed. Aerobic and anaerobic cultures were obtained. An elliptical incision was made around the abscess cavity. This was irrigated out and all loculations were broken up. Bovie cautery was used to obtain hemostasis. This was excellent at the end portion of the case. The wound was then packed with half-inch iodoform gauze. The wounds and dressed with 4 x 4's ABD pad and four-inch Ace wrap.  The patient was taken to the recovery room in stable condition.

## 2013-02-28 NOTE — H&P (View-Only) (Signed)
Subjective:     Patient ID: Christian Maxwell, male   DOB: 04/17/1933, 79 y.o.   MRN: [id number]  HPI The patient is a 79 year old male has a long history of multiple abscesses that have required repeated drainage.  He most recently had a suprapubic abscess drained in the spring. His left arm abscesses required multiple office and operative procedures. He presents with a recurrent left upper arm abscess. He normally has a sinus tract that as long as it stays open, the arm is not enlarged or painful. However, this sinus tract closed off and the arm abscess swelled up and became painful. He also developed some redness over the top.  Review of Systems  All other systems reviewed and are negative.       Objective:   Physical Exam  Constitutional: He is oriented to person, place, and time. He appears well-developed and well-nourished. No distress.  HENT:  Head: Normocephalic and atraumatic.  Eyes: Conjunctivae are normal. Pupils are equal, round, and reactive to light.  Cardiovascular: Normal rate.   Pulmonary/Chest: Effort normal. No respiratory distress.  Abdominal: Soft. He exhibits no distension. There is no tenderness.    Small firm area in lower midline wound.  Sinus tract with minimal drainage just above pubis.    Genitourinary: Penis normal.  Musculoskeletal: Normal range of motion.  Neurological: He is alert and oriented to person, place, and time.  Skin: Skin is warm and dry. No rash noted. He is not diaphoretic. No pallor.     4x5 cm fluctuant mass with mild cellulitis overlying.  Small scab in center where sinus tract was.    Psychiatric: He has a normal mood and affect. His behavior is normal. Judgment and thought content normal.       Assessment/Plan:    Cellulitis and abscess of upper arm and forearm-left, recurrent. Patient has recurrent left upper arm abscess.  Due to his history of bleeding and Coumadin therapy, I think this would be better served to operating room.  It's also quite large.  I discussed this with Dr. Derrell Lolling and Dr. [name]. Dr. [name] has seen the patient the most and does agree this would likely benefit from operative therapy.  He is scheduled for an incision and drainage of the left upper arm abscess on Thursday.   If his INR is too high, he may have to get delayed until Friday.

## 2013-02-28 NOTE — Anesthesia Preprocedure Evaluation (Signed)
Anesthesia Evaluation  Patient identified by MRN, date of birth, ID band Patient awake    Reviewed: Allergy & Precautions, H&P , NPO status , Patient's Chart, lab work & pertinent test results  Airway Mallampati: I TM Distance: >3 FB Neck ROM: Full    Dental   Pulmonary shortness of breath, COPDformer smoker,  + rhonchi         Cardiovascular hypertension, + CAD and + Past MI + dysrhythmias Atrial Fibrillation Rhythm:Regular Rate:Normal     Neuro/Psych    GI/Hepatic   Endo/Other    Renal/GU      Musculoskeletal   Abdominal   Peds  Hematology   Anesthesia Other Findings   Reproductive/Obstetrics                           Anesthesia Physical  Anesthesia Plan  ASA: III  Anesthesia Plan: General   Post-op Pain Management:    Induction: Intravenous  Airway Management Planned: LMA  Additional Equipment:   Intra-op Plan:   Post-operative Plan: Extubation in OR  Informed Consent: I have reviewed the patients History and Physical, chart, labs and discussed the procedure including the risks, benefits and alternatives for the proposed anesthesia with the patient or authorized representative who has indicated his/her understanding and acceptance.     Plan Discussed with: CRNA and Surgeon  Anesthesia Plan Comments:         Anesthesia Quick Evaluation

## 2013-03-03 ENCOUNTER — Encounter (HOSPITAL_COMMUNITY): Payer: Self-pay | Admitting: General Surgery

## 2013-03-03 LAB — CULTURE, ROUTINE-ABSCESS

## 2013-03-04 NOTE — Progress Notes (Signed)
Feels like she needs home health assistance.

## 2013-03-05 ENCOUNTER — Encounter (INDEPENDENT_AMBULATORY_CARE_PROVIDER_SITE_OTHER): Payer: Self-pay | Admitting: General Surgery

## 2013-03-05 ENCOUNTER — Ambulatory Visit (INDEPENDENT_AMBULATORY_CARE_PROVIDER_SITE_OTHER): Payer: Medicare Other | Admitting: General Surgery

## 2013-03-05 VITALS — BP 120/82 | HR 60 | Temp 97.0°F | Resp 18 | Wt 191.0 lb

## 2013-03-05 DIAGNOSIS — IMO0002 Reserved for concepts with insufficient information to code with codable children: Secondary | ICD-10-CM

## 2013-03-05 LAB — ANAEROBIC CULTURE

## 2013-03-05 NOTE — Progress Notes (Signed)
Subjective:     Patient ID: Christian Maxwell, male   DOB: 22-Mar-1933, 79 y.o.   MRN: [id number]  HPI Patient on incision and drainage of recurrent left arm abscess by Dr. Derrell Lolling last week. He is doing daily wet-to-dry dressings. He and his wife asked for home health RN to do the dressing changes. They do not feel they can handle it right now. Once it heals in a little bit more by mouth to take over. Otherwise he has had some recurrent drainage from his pubic area. MR done recently to rule out osteomyelitis only showed a mild area of inflammation. No evidence of osteomyelitis.  Review of Systems     Objective:   Physical Exam Left, and the site is clean. Wet-to-dry dressing was replaced. No ongoing drainage. Minimal erythema. Suprapubic area has mild drainage. Silver nitrate was applied. I could not tunnel in to the wound as previously. It seems to be filling in there is no evidence of ongoing abscess.    Assessment:     Status post I&D recurrent left arm abscess. Ongoing inflammation pubic area at the base of his penis. No osteomyelitis per MRI.    Plan:     Continue wet-to-dry dressings left arm. We will arrange home health. Local care to pubic wound. I will follow that and it may require reexcision to get it to totally heal. It does, however, appear better than the last time I saw him.

## 2013-03-06 ENCOUNTER — Ambulatory Visit (INDEPENDENT_AMBULATORY_CARE_PROVIDER_SITE_OTHER): Payer: Medicare Other | Admitting: Family Medicine

## 2013-03-06 ENCOUNTER — Encounter: Payer: Self-pay | Admitting: Family Medicine

## 2013-03-06 VITALS — BP 124/70 | HR 60 | Temp 97.4°F | Wt 192.5 lb

## 2013-03-06 DIAGNOSIS — G4700 Insomnia, unspecified: Secondary | ICD-10-CM

## 2013-03-06 DIAGNOSIS — S41109A Unspecified open wound of unspecified upper arm, initial encounter: Secondary | ICD-10-CM | POA: Diagnosis not present

## 2013-03-06 DIAGNOSIS — Z23 Encounter for immunization: Secondary | ICD-10-CM

## 2013-03-06 DIAGNOSIS — E785 Hyperlipidemia, unspecified: Secondary | ICD-10-CM | POA: Diagnosis not present

## 2013-03-06 DIAGNOSIS — R413 Other amnesia: Secondary | ICD-10-CM

## 2013-03-06 DIAGNOSIS — I4891 Unspecified atrial fibrillation: Secondary | ICD-10-CM | POA: Diagnosis not present

## 2013-03-06 DIAGNOSIS — Z85038 Personal history of other malignant neoplasm of large intestine: Secondary | ICD-10-CM

## 2013-03-06 DIAGNOSIS — Z7901 Long term (current) use of anticoagulants: Secondary | ICD-10-CM | POA: Diagnosis not present

## 2013-03-06 NOTE — Assessment & Plan Note (Signed)
Chronic, continue aspirin and warfarin. Followed by cardiology coumadin clinic.

## 2013-03-06 NOTE — Assessment & Plan Note (Signed)
Per pt/wife.  On aricept per endo.  Will need memory evaluation next visit - at medicare wellness visit.  Continue aricept for now.

## 2013-03-06 NOTE — Assessment & Plan Note (Signed)
Check FLP next visit 

## 2013-03-06 NOTE — Progress Notes (Signed)
  Subjective:    Patient ID: Christian Maxwell, male    DOB: May 14, 1933, 79 y.o.   MRN: [id number]  HPI CC: 77mo f/u  Christian Maxwell is a 79 yo with h/o HTN, HLD, afib on coumadin, h/o colon cancer 2000 s/p colon surgery, h/o recurrent abscesses Recently established care at our office.  Presents today for 3 month follow up.  He has been battling with recurrent abscesses, latest I&D done by surgery on left upper arm (biceps) as well as recurrent abscess pubic area at base of penis.  He has seen surgery and ID.  Recent MRI negative for osteomyelitis.  Last visit for insomnia we changed from ambien to trazodone.  Trazodone did not help so he restarted ambien.  Memory issues - on aricept, started by Dr. Evlyn Kanner.  Unsure dose he's taking.  Has been taking for several years.  Wife asks about other options  afib on chronic coumadin. Recently stopped coumadin in preparation for L arm I&D. Next check will be 03/18/2013.  Has restarted prior dose.  Not on any oral antibiotics currently. Lab Results  Component Value Date   INR 1.38 02/28/2013   INR 2.09* 02/27/2013   INR 2.7 02/04/2013    H/o colon cancer - per pt, had colonoscopy 2012 by Dr. Arlyce Dice but not in chart   Past Medical History  Diagnosis Date  . Hyperlipidemia   . Syncope and collapse     pt denies  . Atrial fibrillation   . PVC's (premature ventricular contractions)   . Abscess 7/13    abd abscess  . History of colon cancer 2000    T3N0 s/p colectomy  . Normal nuclear stress test 2004  . METHICILLIN RESISTANT STAPHYLOCOCCUS AUREUS INFECTION 12/12/2006    Annotation: stitch abcess in lower abdomen after colon cancer resection Qualifier: Diagnosis of  By: [name] MD, [name]    . Postoperative stitch abscess 07/19/2011  . Hx of adenomatous colonic polyps   . Thyroid disease     followed by Dr. Evlyn Kanner  . Insomnia   . Skin abscess     recurrent  . Hypertension     pt states he does not have this. 02/26/13  . Myocardial infarction     at  age 79 years  . Cancer     colon cancer 2000  . Arthritis     bursitis in left shoulder     Review of Systems Per HPI     Objective:   Physical Exam  Nursing note and vitals reviewed. Constitutional: He appears well-developed and well-nourished. No distress.  HENT:  Mouth/Throat: Oropharynx is clear and moist. No oropharyngeal exudate.  Eyes: Conjunctivae and EOM are normal. Pupils are equal, round, and reactive to light. No scleral icterus.  Cardiovascular: Normal rate, regular rhythm, normal heart sounds and intact distal pulses.   No murmur heard. Pulmonary/Chest: Effort normal and breath sounds normal. No respiratory distress. He has no wheezes. He has no rales.  Musculoskeletal: He exhibits no edema.  Skin: Skin is warm and dry.  Psychiatric: He has a normal mood and affect.       Assessment & Plan:

## 2013-03-06 NOTE — Assessment & Plan Note (Signed)
I have requested records of latest colonoscopy (per pt done 2012 by Dr. Arlyce Dice)

## 2013-03-06 NOTE — Assessment & Plan Note (Signed)
Continue ambien

## 2013-03-06 NOTE — Addendum Note (Signed)
Addended by: Shon Millet on: 03/06/2013 10:22 AM   Modules accepted: Orders

## 2013-03-06 NOTE — Patient Instructions (Addendum)
Sign release so we can get records of your latest colonoscopy by Dr. Arlyce Dice. Flu shot today. Your next coumadin check will be 03/18/2013. Return in 6 months for fasting blood work and afterwards for [employer] visit.

## 2013-03-07 DIAGNOSIS — S41109A Unspecified open wound of unspecified upper arm, initial encounter: Secondary | ICD-10-CM | POA: Diagnosis not present

## 2013-03-07 DIAGNOSIS — Z7901 Long term (current) use of anticoagulants: Secondary | ICD-10-CM | POA: Diagnosis not present

## 2013-03-07 DIAGNOSIS — I4891 Unspecified atrial fibrillation: Secondary | ICD-10-CM | POA: Diagnosis not present

## 2013-03-08 DIAGNOSIS — I4891 Unspecified atrial fibrillation: Secondary | ICD-10-CM | POA: Diagnosis not present

## 2013-03-08 DIAGNOSIS — S41109A Unspecified open wound of unspecified upper arm, initial encounter: Secondary | ICD-10-CM | POA: Diagnosis not present

## 2013-03-08 DIAGNOSIS — Z7901 Long term (current) use of anticoagulants: Secondary | ICD-10-CM | POA: Diagnosis not present

## 2013-03-10 ENCOUNTER — Other Ambulatory Visit (INDEPENDENT_AMBULATORY_CARE_PROVIDER_SITE_OTHER): Payer: Self-pay

## 2013-03-10 DIAGNOSIS — IMO0002 Reserved for concepts with insufficient information to code with codable children: Secondary | ICD-10-CM

## 2013-03-10 MED ORDER — DOXYCYCLINE HYCLATE 50 MG PO CAPS: 50.0000 mg | ORAL_CAPSULE | Freq: Two times a day (BID) | ORAL | Status: DC

## 2013-03-10 NOTE — Progress Notes (Signed)
Request for refill rec'd via fax on 10/24.  Authorization faxed to [facility].  Order entered into chart for Doxy 50 mg po #28 take 2 caps po bid.  No refill

## 2013-03-11 ENCOUNTER — Encounter (INDEPENDENT_AMBULATORY_CARE_PROVIDER_SITE_OTHER): Payer: Medicare Other | Admitting: General Surgery

## 2013-03-11 DIAGNOSIS — I4891 Unspecified atrial fibrillation: Secondary | ICD-10-CM | POA: Diagnosis not present

## 2013-03-11 DIAGNOSIS — Z7901 Long term (current) use of anticoagulants: Secondary | ICD-10-CM | POA: Diagnosis not present

## 2013-03-11 DIAGNOSIS — S41109A Unspecified open wound of unspecified upper arm, initial encounter: Secondary | ICD-10-CM | POA: Diagnosis not present

## 2013-03-13 ENCOUNTER — Encounter (INDEPENDENT_AMBULATORY_CARE_PROVIDER_SITE_OTHER): Payer: Medicare Other | Admitting: General Surgery

## 2013-03-13 DIAGNOSIS — I4891 Unspecified atrial fibrillation: Secondary | ICD-10-CM | POA: Diagnosis not present

## 2013-03-13 DIAGNOSIS — S41109A Unspecified open wound of unspecified upper arm, initial encounter: Secondary | ICD-10-CM | POA: Diagnosis not present

## 2013-03-13 DIAGNOSIS — Z7901 Long term (current) use of anticoagulants: Secondary | ICD-10-CM | POA: Diagnosis not present

## 2013-03-18 ENCOUNTER — Ambulatory Visit (INDEPENDENT_AMBULATORY_CARE_PROVIDER_SITE_OTHER): Payer: Medicare Other | Admitting: Pharmacist

## 2013-03-18 DIAGNOSIS — Z7901 Long term (current) use of anticoagulants: Secondary | ICD-10-CM | POA: Diagnosis not present

## 2013-03-18 DIAGNOSIS — S41109A Unspecified open wound of unspecified upper arm, initial encounter: Secondary | ICD-10-CM | POA: Diagnosis not present

## 2013-03-18 DIAGNOSIS — I4891 Unspecified atrial fibrillation: Secondary | ICD-10-CM | POA: Diagnosis not present

## 2013-03-18 LAB — POCT INR: INR: 5.3

## 2013-03-19 ENCOUNTER — Encounter (INDEPENDENT_AMBULATORY_CARE_PROVIDER_SITE_OTHER): Payer: Self-pay | Admitting: General Surgery

## 2013-03-19 ENCOUNTER — Ambulatory Visit (INDEPENDENT_AMBULATORY_CARE_PROVIDER_SITE_OTHER): Payer: Medicare Other | Admitting: General Surgery

## 2013-03-19 VITALS — BP 136/68 | HR 76 | Resp 20 | Ht 70.0 in | Wt 185.4 lb

## 2013-03-19 DIAGNOSIS — L02219 Cutaneous abscess of trunk, unspecified: Secondary | ICD-10-CM | POA: Diagnosis not present

## 2013-03-19 DIAGNOSIS — IMO0002 Reserved for concepts with insufficient information to code with codable children: Secondary | ICD-10-CM

## 2013-03-19 NOTE — Progress Notes (Signed)
Subjective:     Patient ID: Christian Maxwell, male   DOB: 03-Aug-1932, 79 y.o.   MRN: [id number]  HPI Patient presents for followup of left arm abscess since repeat abscess. He has been doing daily dressing changes on his arm. He keeps a Band-Aid over the suprapubic area. He seems to be doing much better.  Review of Systems     Objective:   Physical Exam  Constitutional: He appears well-developed and well-nourished. No distress.  Cardiovascular:  Irregularly irregular  Pulmonary/Chest: Effort normal. No respiratory distress. He has no wheezes.  Abdominal: Soft. He exhibits no distension. There is no tenderness.   Arm abscess is clean with granulation tissue. A new wet-to-dry dressing was placed. The cellulitis has completely resolved. Suprapubic area as almost completely closed. There is a small opening a couple millimeters in size which was treated with silver nitrate.    Assessment:     Left arm abscess and suprapubic abscess both improving    Plan:     Continue wound care and return in 2 weeks. Continue doxycycline.

## 2013-03-24 ENCOUNTER — Ambulatory Visit (INDEPENDENT_AMBULATORY_CARE_PROVIDER_SITE_OTHER): Payer: Medicare Other | Admitting: General Practice

## 2013-03-24 DIAGNOSIS — I4891 Unspecified atrial fibrillation: Secondary | ICD-10-CM

## 2013-03-24 LAB — POCT INR: INR: 1.6

## 2013-03-26 DIAGNOSIS — I4891 Unspecified atrial fibrillation: Secondary | ICD-10-CM | POA: Diagnosis not present

## 2013-03-26 DIAGNOSIS — S41109A Unspecified open wound of unspecified upper arm, initial encounter: Secondary | ICD-10-CM | POA: Diagnosis not present

## 2013-03-26 DIAGNOSIS — Z7901 Long term (current) use of anticoagulants: Secondary | ICD-10-CM | POA: Diagnosis not present

## 2013-03-31 ENCOUNTER — Ambulatory Visit (INDEPENDENT_AMBULATORY_CARE_PROVIDER_SITE_OTHER): Payer: Medicare Other | Admitting: Pharmacist

## 2013-03-31 DIAGNOSIS — Z7901 Long term (current) use of anticoagulants: Secondary | ICD-10-CM | POA: Diagnosis not present

## 2013-03-31 DIAGNOSIS — I4891 Unspecified atrial fibrillation: Secondary | ICD-10-CM | POA: Diagnosis not present

## 2013-03-31 DIAGNOSIS — S41109A Unspecified open wound of unspecified upper arm, initial encounter: Secondary | ICD-10-CM | POA: Diagnosis not present

## 2013-03-31 LAB — POCT INR: INR: 2.9

## 2013-04-02 ENCOUNTER — Other Ambulatory Visit (HOSPITAL_COMMUNITY): Payer: Self-pay | Admitting: Orthopaedic Surgery

## 2013-04-02 ENCOUNTER — Encounter (HOSPITAL_COMMUNITY): Payer: Medicare Other | Admitting: Anesthesiology

## 2013-04-02 ENCOUNTER — Ambulatory Visit (INDEPENDENT_AMBULATORY_CARE_PROVIDER_SITE_OTHER): Payer: Medicare Other | Admitting: General Surgery

## 2013-04-02 ENCOUNTER — Ambulatory Visit (HOSPITAL_COMMUNITY): Payer: Medicare Other | Admitting: Anesthesiology

## 2013-04-02 ENCOUNTER — Ambulatory Visit (HOSPITAL_COMMUNITY)
Admission: RE | Admit: 2013-04-02 | Discharge: 2013-04-03 | Disposition: A | Discharge status: Home / Self Care | Payer: Medicare Other | Source: Ambulatory Visit | Attending: Orthopaedic Surgery | Admitting: Orthopaedic Surgery

## 2013-04-02 ENCOUNTER — Encounter (INDEPENDENT_AMBULATORY_CARE_PROVIDER_SITE_OTHER): Payer: Self-pay | Admitting: General Surgery

## 2013-04-02 ENCOUNTER — Encounter (HOSPITAL_COMMUNITY)
Admission: RE | Disposition: A | Discharge status: Home / Self Care | Payer: Self-pay | Source: Ambulatory Visit | Attending: Orthopaedic Surgery

## 2013-04-02 ENCOUNTER — Encounter (HOSPITAL_COMMUNITY): Payer: Self-pay | Admitting: *Deleted

## 2013-04-02 ENCOUNTER — Encounter (HOSPITAL_COMMUNITY): Payer: Self-pay | Admitting: Pharmacy Technician

## 2013-04-02 VITALS — BP 132/76 | HR 70 | Temp 98.2°F | Resp 16 | Ht 70.0 in | Wt 192.2 lb

## 2013-04-02 DIAGNOSIS — I252 Old myocardial infarction: Secondary | ICD-10-CM | POA: Diagnosis not present

## 2013-04-02 DIAGNOSIS — W19XXXA Unspecified fall, initial encounter: Secondary | ICD-10-CM | POA: Insufficient documentation

## 2013-04-02 DIAGNOSIS — L0291 Cutaneous abscess, unspecified: Secondary | ICD-10-CM | POA: Insufficient documentation

## 2013-04-02 DIAGNOSIS — IMO0002 Reserved for concepts with insufficient information to code with codable children: Secondary | ICD-10-CM | POA: Diagnosis not present

## 2013-04-02 DIAGNOSIS — G4700 Insomnia, unspecified: Secondary | ICD-10-CM | POA: Insufficient documentation

## 2013-04-02 DIAGNOSIS — S8000XA Contusion of unspecified knee, initial encounter: Secondary | ICD-10-CM

## 2013-04-02 DIAGNOSIS — Z85038 Personal history of other malignant neoplasm of large intestine: Secondary | ICD-10-CM | POA: Insufficient documentation

## 2013-04-02 DIAGNOSIS — M25069 Hemarthrosis, unspecified knee: Secondary | ICD-10-CM | POA: Diagnosis not present

## 2013-04-02 DIAGNOSIS — Z7901 Long term (current) use of anticoagulants: Secondary | ICD-10-CM | POA: Diagnosis not present

## 2013-04-02 DIAGNOSIS — S8001XA Contusion of right knee, initial encounter: Secondary | ICD-10-CM | POA: Insufficient documentation

## 2013-04-02 DIAGNOSIS — I10 Essential (primary) hypertension: Secondary | ICD-10-CM | POA: Insufficient documentation

## 2013-04-02 DIAGNOSIS — E785 Hyperlipidemia, unspecified: Secondary | ICD-10-CM | POA: Insufficient documentation

## 2013-04-02 DIAGNOSIS — E079 Disorder of thyroid, unspecified: Secondary | ICD-10-CM | POA: Diagnosis not present

## 2013-04-02 DIAGNOSIS — I4891 Unspecified atrial fibrillation: Secondary | ICD-10-CM | POA: Insufficient documentation

## 2013-04-02 DIAGNOSIS — M25569 Pain in unspecified knee: Secondary | ICD-10-CM | POA: Diagnosis not present

## 2013-04-02 DIAGNOSIS — Z87891 Personal history of nicotine dependence: Secondary | ICD-10-CM | POA: Diagnosis not present

## 2013-04-02 DIAGNOSIS — L02219 Cutaneous abscess of trunk, unspecified: Secondary | ICD-10-CM

## 2013-04-02 HISTORY — PX: INCISION AND DRAINAGE: SHX5863

## 2013-04-02 LAB — BASIC METABOLIC PANEL WITH GFR
BUN: 24 mg/dL — ABNORMAL HIGH (ref 6–23)
CO2: 27 meq/L (ref 19–32)
Calcium: 10.1 mg/dL (ref 8.4–10.5)
Chloride: 101 meq/L (ref 96–112)
Creatinine, Ser: 0.87 mg/dL (ref 0.50–1.35)
GFR calc Af Amer: >90 mL/min
GFR calc non Af Amer: 81 mL/min — ABNORMAL LOW
Glucose, Bld: 111 mg/dL — ABNORMAL HIGH (ref 70–99)
Potassium: 4.8 meq/L (ref 3.5–5.1)
Sodium: 139 meq/L (ref 135–145)

## 2013-04-02 LAB — CBC
HCT: 43.6 % (ref 39.0–52.0)
Hemoglobin: 14.8 g/dL (ref 13.0–17.0)
MCH: 29.6 pg (ref 26.0–34.0)
MCHC: 33.9 g/dL (ref 30.0–36.0)
MCV: 87.2 fL (ref 78.0–100.0)
Platelets: 194 10*3/uL (ref 150–400)
RBC: 5 MIL/uL (ref 4.22–5.81)
RDW: 13.1 % (ref 11.5–15.5)
WBC: 9.8 10*3/uL (ref 4.0–10.5)

## 2013-04-02 LAB — PROTIME-INR
INR: 2.73 — ABNORMAL HIGH (ref 0.00–1.49)
Prothrombin Time: 28 seconds — ABNORMAL HIGH (ref 11.6–15.2)

## 2013-04-02 LAB — APTT: aPTT: 36 s (ref 24–37)

## 2013-04-02 SURGERY — INCISION AND DRAINAGE
Anesthesia: General | Site: Knee | Laterality: Right | Wound class: Clean

## 2013-04-02 MED ORDER — LIDOCAINE HCL 4 % MT SOLN
OROMUCOSAL | Status: DC | PRN
  Administered 2013-04-02: 4 mL via TOPICAL

## 2013-04-02 MED ORDER — ASPIRIN EC 81 MG PO TBEC
162.0000 mg | DELAYED_RELEASE_TABLET | Freq: Every day | ORAL | Status: DC
  Administered 2013-04-03: 162 mg via ORAL
  Filled 2013-04-02 (×2): qty 2

## 2013-04-02 MED ORDER — METHOCARBAMOL 500 MG PO TABS: 500.0000 mg | ORAL_TABLET | Freq: Four times a day (QID) | ORAL | Status: DC | PRN

## 2013-04-02 MED ORDER — SUCCINYLCHOLINE CHLORIDE 20 MG/ML IJ SOLN
INTRAMUSCULAR | Status: DC | PRN
  Administered 2013-04-02: 100 mg via INTRAVENOUS

## 2013-04-02 MED ORDER — DONEPEZIL HCL 10 MG PO TABS
10.0000 mg | ORAL_TABLET | Freq: Every day | ORAL | Status: DC
  Filled 2013-04-02 (×2): qty 1

## 2013-04-02 MED ORDER — SODIUM CHLORIDE 0.9 % IR SOLN
Status: DC | PRN
  Administered 2013-04-02: 2000 mL

## 2013-04-02 MED ORDER — METOCLOPRAMIDE HCL 5 MG/ML IJ SOLN: 5.0000 mg | Freq: Three times a day (TID) | INTRAMUSCULAR | Status: DC | PRN

## 2013-04-02 MED ORDER — MORPHINE SULFATE 2 MG/ML IJ SOLN: 1.0000 mg | INTRAMUSCULAR | Status: DC | PRN

## 2013-04-02 MED ORDER — ARTIFICIAL TEARS OP OINT
TOPICAL_OINTMENT | OPHTHALMIC | Status: DC | PRN
  Administered 2013-04-02: 1 via OPHTHALMIC

## 2013-04-02 MED ORDER — ONDANSETRON HCL 4 MG PO TABS: 4.0000 mg | ORAL_TABLET | Freq: Four times a day (QID) | ORAL | Status: DC | PRN

## 2013-04-02 MED ORDER — OXYCODONE HCL 5 MG PO TABS: 5.0000 mg | ORAL_TABLET | ORAL | Status: DC | PRN

## 2013-04-02 MED ORDER — ASPIRIN 81 MG PO TABS: 162.0000 mg | ORAL_TABLET | Freq: Every day | ORAL | Status: DC

## 2013-04-02 MED ORDER — METOPROLOL TARTRATE 25 MG PO TABS
25.0000 mg | ORAL_TABLET | Freq: Two times a day (BID) | ORAL | Status: DC
  Filled 2013-04-02 (×3): qty 1

## 2013-04-02 MED ORDER — ONDANSETRON HCL 4 MG/2ML IJ SOLN: 4.0000 mg | Freq: Four times a day (QID) | INTRAMUSCULAR | Status: DC | PRN

## 2013-04-02 MED ORDER — ONDANSETRON HCL 4 MG/2ML IJ SOLN
INTRAMUSCULAR | Status: DC | PRN
  Administered 2013-04-02: 4 mg via INTRAVENOUS

## 2013-04-02 MED ORDER — FOLIC ACID 1 MG PO TABS
1.0000 mg | ORAL_TABLET | Freq: Every day | ORAL | Status: DC
  Administered 2013-04-03: 1 mg via ORAL
  Filled 2013-04-02 (×2): qty 1

## 2013-04-02 MED ORDER — HYDROMORPHONE HCL PF 1 MG/ML IJ SOLN: 0.2500 mg | INTRAMUSCULAR | Status: DC | PRN

## 2013-04-02 MED ORDER — CEFAZOLIN SODIUM 1-5 GM-% IV SOLN
1.0000 g | Freq: Four times a day (QID) | INTRAVENOUS | Status: AC
  Administered 2013-04-02 – 2013-04-03 (×3): 1 g via INTRAVENOUS
  Filled 2013-04-02 (×4): qty 50

## 2013-04-02 MED ORDER — ONDANSETRON HCL 4 MG/2ML IJ SOLN: 4.0000 mg | Freq: Once | INTRAMUSCULAR | Status: DC | PRN

## 2013-04-02 MED ORDER — ZOLPIDEM TARTRATE 5 MG PO TABS: 5.0000 mg | ORAL_TABLET | Freq: Every evening | ORAL | Status: DC | PRN

## 2013-04-02 MED ORDER — PROPOFOL 10 MG/ML IV BOLUS
INTRAVENOUS | Status: DC | PRN
  Administered 2013-04-02: 130 mg via INTRAVENOUS

## 2013-04-02 MED ORDER — SODIUM CHLORIDE 0.9 % IV SOLN: INTRAVENOUS | Status: DC

## 2013-04-02 MED ORDER — CEFAZOLIN SODIUM-DEXTROSE 2-3 GM-% IV SOLR
2.0000 g | INTRAVENOUS | Status: DC
  Filled 2013-04-02: qty 50

## 2013-04-02 MED ORDER — LACTATED RINGERS IV SOLN
INTRAVENOUS | Status: DC | PRN
  Administered 2013-04-02 (×2): via INTRAVENOUS

## 2013-04-02 MED ORDER — LACTATED RINGERS IV SOLN: INTRAVENOUS | Status: DC

## 2013-04-02 MED ORDER — DIPHENHYDRAMINE HCL 12.5 MG/5ML PO ELIX: 12.5000 mg | ORAL_SOLUTION | ORAL | Status: DC | PRN

## 2013-04-02 MED ORDER — SIMVASTATIN 40 MG PO TABS
40.0000 mg | ORAL_TABLET | Freq: Every evening | ORAL | Status: DC
  Filled 2013-04-02 (×2): qty 1

## 2013-04-02 MED ORDER — FENTANYL CITRATE 0.05 MG/ML IJ SOLN
INTRAMUSCULAR | Status: DC | PRN
  Administered 2013-04-02: 100 ug via INTRAVENOUS

## 2013-04-02 MED ORDER — LIDOCAINE HCL 1 % IJ SOLN
INTRAMUSCULAR | Status: DC | PRN
  Administered 2013-04-02: 100 mg via INTRADERMAL

## 2013-04-02 MED ORDER — METOCLOPRAMIDE HCL 10 MG PO TABS: 5.0000 mg | ORAL_TABLET | Freq: Three times a day (TID) | ORAL | Status: DC | PRN

## 2013-04-02 MED ORDER — ZOLPIDEM TARTRATE 5 MG PO TABS
5.0000 mg | ORAL_TABLET | Freq: Every evening | ORAL | Status: DC | PRN
  Administered 2013-04-02: 5 mg via ORAL
  Filled 2013-04-02 (×2): qty 1

## 2013-04-02 MED ORDER — HYDROCODONE-ACETAMINOPHEN 5-325 MG PO TABS: 1.0000 | ORAL_TABLET | ORAL | Status: DC | PRN

## 2013-04-02 MED ORDER — METHOCARBAMOL 100 MG/ML IJ SOLN
500.0000 mg | Freq: Four times a day (QID) | INTRAMUSCULAR | Status: DC | PRN
  Filled 2013-04-02: qty 5

## 2013-04-02 MED ORDER — CEFAZOLIN SODIUM-DEXTROSE 2-3 GM-% IV SOLR
INTRAVENOUS | Status: DC | PRN
  Administered 2013-04-02: 2 g via INTRAVENOUS

## 2013-04-02 MED ORDER — PHENYLEPHRINE HCL 10 MG/ML IJ SOLN
10.0000 mg | INTRAVENOUS | Status: DC | PRN
  Administered 2013-04-02: 10 ug/min via INTRAVENOUS

## 2013-04-02 SURGICAL SUPPLY — 59 items
BANDAGE CONFORM 3  STR LF (GAUZE/BANDAGES/DRESSINGS) IMPLANT
BANDAGE ELASTIC 3 VELCRO ST LF (GAUZE/BANDAGES/DRESSINGS) IMPLANT
BANDAGE ELASTIC 6 VELCRO ST LF (GAUZE/BANDAGES/DRESSINGS) ×4 IMPLANT
BLADE SURG 10 STRL SS (BLADE) ×2 IMPLANT
BNDG COHESIVE 1X5 TAN STRL LF (GAUZE/BANDAGES/DRESSINGS) IMPLANT
BNDG COHESIVE 4X5 TAN STRL (GAUZE/BANDAGES/DRESSINGS) ×2 IMPLANT
BNDG COHESIVE 6X5 TAN STRL LF (GAUZE/BANDAGES/DRESSINGS) ×3 IMPLANT
BNDG GAUZE STRTCH 6 (GAUZE/BANDAGES/DRESSINGS) ×3 IMPLANT
CLOTH BEACON ORANGE TIMEOUT ST (SAFETY) IMPLANT
CORDS BIPOLAR (ELECTRODE) IMPLANT
COVER SURGICAL LIGHT HANDLE (MISCELLANEOUS) ×2 IMPLANT
CUFF TOURNIQUET SINGLE 18IN (TOURNIQUET CUFF) ×2 IMPLANT
CUFF TOURNIQUET SINGLE 24IN (TOURNIQUET CUFF) IMPLANT
CUFF TOURNIQUET SINGLE 34IN LL (TOURNIQUET CUFF) IMPLANT
CUFF TOURNIQUET SINGLE 44IN (TOURNIQUET CUFF) IMPLANT
DRAPE ORTHO SPLIT 77X108 STRL (DRAPES)
DRAPE SURG 17X23 STRL (DRAPES) IMPLANT
DRAPE SURG ORHT 6 SPLT 77X108 (DRAPES) ×2 IMPLANT
DRAPE U-SHAPE 47X51 STRL (DRAPES) ×2 IMPLANT
DRSG PAD ABDOMINAL 8X10 ST (GAUZE/BANDAGES/DRESSINGS) ×1 IMPLANT
DURAPREP 26ML APPLICATOR (WOUND CARE) ×2 IMPLANT
ELECT CAUTERY BLADE 6.4 (BLADE) ×1 IMPLANT
ELECT REM PT RETURN 9FT ADLT (ELECTROSURGICAL) ×2
ELECTRODE REM PT RTRN 9FT ADLT (ELECTROSURGICAL) IMPLANT
EVACUATOR 1/8 PVC DRAIN (DRAIN) ×1 IMPLANT
GAUZE XEROFORM 1X8 LF (GAUZE/BANDAGES/DRESSINGS) ×1 IMPLANT
GAUZE XEROFORM 5X9 LF (GAUZE/BANDAGES/DRESSINGS) ×2 IMPLANT
GLOVE BIO SURGEON STRL SZ8 (GLOVE) ×2 IMPLANT
GLOVE BIOGEL PI IND STRL 8 (GLOVE) ×2 IMPLANT
GLOVE BIOGEL PI INDICATOR 8 (GLOVE) ×1
GLOVE ORTHO TXT STRL SZ7.5 (GLOVE) ×1 IMPLANT
GOWN PREVENTION PLUS LG XLONG (DISPOSABLE) IMPLANT
GOWN PREVENTION PLUS XLARGE (GOWN DISPOSABLE) ×3 IMPLANT
GOWN STRL NON-REIN LRG LVL3 (GOWN DISPOSABLE) ×2 IMPLANT
HANDPIECE INTERPULSE COAX TIP (DISPOSABLE) ×2
KIT BASIN OR (CUSTOM PROCEDURE TRAY) ×2 IMPLANT
KIT ROOM TURNOVER OR (KITS) ×2 IMPLANT
MANIFOLD NEPTUNE II (INSTRUMENTS) ×2 IMPLANT
NS IRRIG 1000ML POUR BTL (IV SOLUTION) ×1 IMPLANT
PACK ORTHO EXTREMITY (CUSTOM PROCEDURE TRAY) ×2 IMPLANT
PAD ARMBOARD 7.5X6 YLW CONV (MISCELLANEOUS) ×4 IMPLANT
PADDING CAST ABS 4INX4YD NS (CAST SUPPLIES)
PADDING CAST ABS COTTON 4X4 ST (CAST SUPPLIES) ×2 IMPLANT
PADDING CAST COTTON 6X4 STRL (CAST SUPPLIES) ×2 IMPLANT
SET HNDPC FAN SPRY TIP SCT (DISPOSABLE) IMPLANT
SPONGE GAUZE 4X4 12PLY (GAUZE/BANDAGES/DRESSINGS) ×2 IMPLANT
SPONGE LAP 18X18 X RAY DECT (DISPOSABLE) ×2 IMPLANT
STOCKINETTE IMPERVIOUS 9X36 MD (GAUZE/BANDAGES/DRESSINGS) ×2 IMPLANT
SUT ETHILON 2 0 FS 18 (SUTURE) ×6 IMPLANT
SUT ETHILON 3 0 PS 1 (SUTURE) ×4 IMPLANT
SYR CONTROL 10ML LL (SYRINGE) IMPLANT
TOWEL OR 17X24 6PK STRL BLUE (TOWEL DISPOSABLE) ×2 IMPLANT
TOWEL OR 17X26 10 PK STRL BLUE (TOWEL DISPOSABLE) ×2 IMPLANT
TUBE ANAEROBIC SPECIMEN COL (MISCELLANEOUS) IMPLANT
TUBE CONNECTING 12X1/4 (SUCTIONS) ×2 IMPLANT
TUBE FEEDING 5FR 15 INCH (TUBING) IMPLANT
UNDERPAD 30X30 INCONTINENT (UNDERPADS AND DIAPERS) ×1 IMPLANT
WATER STERILE IRR 1000ML POUR (IV SOLUTION) ×1 IMPLANT
YANKAUER SUCT BULB TIP NO VENT (SUCTIONS) ×2 IMPLANT

## 2013-04-02 NOTE — Progress Notes (Signed)
Subjective:     Patient ID: Christian Maxwell, male   DOB: 05-16-1932, 79 y.o.   MRN: [id number]  HPI Patient presents for followup of left arm abscess and suprapubic abscess. Suprapubic abscess he feels is getting better though it is still draining a little bit. His arm seems to be healing well. Of note, he fell down last night striking his right knee. He is developed significant swelling and he is having difficulty bending it. He is on Coumadin currently.  Review of Systems     Objective:   Physical Exam Right knee has a large hematoma medially extending anteriorly with a possible joint effusion comment is difficult to palpate. A large bruise extends down anteriorly to the proximal third of his tibia, there is mild tenderness  Right arm abscess has clean granulation tissue and is nearly healed, no ongoing infection  Suprapubic area is also improved. It is only a couple millimeters deep with clean granulation tissue, there is some surrounding scar and clear fluid drainage.    Assessment:     Acute right knee hematoma, left arm abscess significantly improved,  Chronic suprapubic abscess with slow improvement    Plan:     Continue local wound care for his arm and suprapubic area. I am concerned about his right knee hematoma in light of his anticoagulation in the size of the effusion. I got him an appointment to see Dr. Allie Bossier from  [facility] at 1 PM this afternoon. I will see him back next month.

## 2013-04-02 NOTE — Preoperative (Signed)
Beta Blockers   Reason not to administer Beta Blockers:Not Applicable, pt states that he took metoprolol this am.

## 2013-04-02 NOTE — Anesthesia Preprocedure Evaluation (Addendum)
Anesthesia Evaluation  Patient identified by MRN, date of birth, ID band Patient awake    Reviewed: Allergy & Precautions, H&P , NPO status , Patient's Chart, lab work & pertinent test results, reviewed documented beta blocker date and time   History of Anesthesia Complications Negative for: history of anesthetic complications  Airway Mallampati: I TM Distance: >3 FB Neck ROM: Full    Dental  (+) Teeth Intact and Dental Advisory Given   Pulmonary shortness of breath, COPDformer smoker,  + rhonchi         Cardiovascular hypertension, Pt. on medications and Pt. on home beta blockers + CAD and + Past MI + dysrhythmias Atrial Fibrillation Rhythm:Regular Rate:Normal  2013 echo LV EF: 55% -   60%   Neuro/Psych    GI/Hepatic negative GI ROS, Neg liver ROS,   Endo/Other  negative endocrine ROS  Renal/GU negative Renal ROS     Musculoskeletal   Abdominal   Peds  Hematology negative hematology ROS (+)   Anesthesia Other Findings   Reproductive/Obstetrics                          Anesthesia Physical Anesthesia Plan  ASA: III  Anesthesia Plan: General   Post-op Pain Management:    Induction: Intravenous  Airway Management Planned: Oral ETT  Additional Equipment:   Intra-op Plan:   Post-operative Plan: Extubation in OR  Informed Consent: I have reviewed the patients History and Physical, chart, labs and discussed the procedure including the risks, benefits and alternatives for the proposed anesthesia with the patient or authorized representative who has indicated his/her understanding and acceptance.     Plan Discussed with: CRNA, Surgeon and Anesthesiologist  Anesthesia Plan Comments:         Anesthesia Quick Evaluation

## 2013-04-02 NOTE — Brief Op Note (Signed)
04/02/2013  6:04 PM  PATIENT:  Christian Maxwell  79 y.o. male  PRE-OPERATIVE DIAGNOSIS:  Large expanding hematoma right knee  POST-OPERATIVE DIAGNOSIS:  Large expanding hematoma right knee  PROCEDURE:  Procedure(s): INCISION AND DRAINAGE RIGHT KNEE HEMATOMA (Right)  SURGEON:  Surgeon(s) and Role:    * Kathryne Hitch, MD - Primary  PHYSICIAN ASSISTANT: Rexene Edison, PA-C  ANESTHESIA:   general  EBL:  Total I/O In: 1000 [I.V.:1000] Out: -   BLOOD ADMINISTERED:none  DRAINS: medium hemovac   LOCAL MEDICATIONS USED:  NONE  SPECIMEN:  No Specimen  DISPOSITION OF SPECIMEN:  N/A  COUNTS:  YES  TOURNIQUET:  * Missing tourniquet times found for documented tourniquets in log:  [id number] *  DICTATION: .Other Dictation: Dictation Number [id number]  PLAN OF CARE: Admit for overnight observation  PATIENT DISPOSITION:  PACU - hemodynamically stable.   Delay start of Pharmacological VTE agent (>24hrs) due to surgical blood loss or risk of bleeding: no

## 2013-04-02 NOTE — H&P (Signed)
Christian Maxwell is an 80 y.o. male.   Chief Complaint:   Right knee pain, swelling, and a large hematoma. HPI:   79 yo male with a large right knee hematoma that is expanding.  He is quite uncomfortable.  He is on coumadin and had a mechanical fall last evening.  He was seen in the office this AM and was found to have a large hematoma that is expanding and very uncomfortable.  It is causing significant skin issues and weeping.  I'm am worried about his discomfort and the potential for soft-tissue necrosis given the swelling.  I have recommended an I&D and evacuation of his hematoma.  Risks have been discussed in great detail given his health status.  Past Medical History  Diagnosis Date  . Hyperlipidemia   . Syncope and collapse     pt denies  . Atrial fibrillation   . PVC's (premature ventricular contractions)   . Abscess 7/13    abd abscess  . History of colon cancer 2000    T3N0 s/p colectomy  . Normal nuclear stress test 2004  . METHICILLIN RESISTANT STAPHYLOCOCCUS AUREUS INFECTION 12/12/2006    Annotation: stitch abcess in lower abdomen after colon cancer resection Qualifier: Diagnosis of  By: [name] MD, [name]    . Postoperative stitch abscess 07/19/2011  . Hx of adenomatous colonic polyps   . Thyroid disease     followed by Dr. Evlyn Kanner  . Insomnia   . Skin abscess     recurrent  . Hypertension     pt states he does not have this. 02/26/13  . Myocardial infarction     at age 54 years  . Cancer     colon cancer 2000  . Arthritis     bursitis in left shoulder    Past Surgical History  Procedure Laterality Date  . Lumbar laminectomy  1977  . Wound exploration  2006    For possible stitch abscess Dr [name]  . Incise and drain abcess  2009,2010,2011,2012  . Colectomy  2000    6 inches removed  . Mass excision  03/07/2012    EXCISION MASS;  Surgeon: [name], MD;  Laterality: Right;  removal mass posterior right arm  . Polypectomy    . Colonoscopy  2012    per  patient (Dr. Arlyce Dice)  . Incision and drainage abscess N/A 09/20/2012    Procedure: INCISION AND DRAINAGE SUPRAPUBIC ABSCESS;  Surgeon: [name], MD;  Location: [facility];  Service: General;  Laterality: N/A;  . Hernia repair  09/2002    DB umbilical hernia  . Eye surgery      cataract surgery both eyes  . Incision and drainage abscess Left 02/28/2013    Procedure: INCISION AND DRAINAGE LEFT ARM ABSCESS;  Surgeon: Axel Filler, MD;  Location: [facility] OR;  Service: General;  Laterality: Left;    Family History  Problem Relation Age of Onset  . Stroke Mother   . Stroke Father   . Hypertension Father   . CAD Father 41    MI  . Stroke Sister 17  . Stroke Brother   . Stroke Brother   . Diabetes Brother   . CAD Brother 47    MI  . Cancer Neg Hx    Social History:  reports that he quit smoking about 35 years ago. He quit smokeless tobacco use about 35 years ago. His smokeless tobacco use included Chew. He reports that he does not drink alcohol  or use illicit drugs.  Allergies: No Known Allergies  Medications Prior to Admission  Medication Sig Dispense Refill  . aspirin 81 MG tablet Take 162 mg by mouth daily.       [name] CALCIUM PO Take 1 tablet by mouth daily.       [name] donepezil (ARICEPT) 10 MG tablet Take 10 mg by mouth at bedtime.      [name] doxycycline (VIBRAMYCIN) 50 MG capsule Take 1 capsule (50 mg total) by mouth 2 (two) times daily.  28 capsule  0  . fish oil-omega-3 fatty acids 1000 MG capsule Take 1 g by mouth daily.       . folic acid (FOLVITE) 1 MG tablet Take 1 mg by mouth daily.      . metoprolol tartrate (LOPRESSOR) 25 MG tablet Take 1 tablet (25 mg total) by mouth 2 (two) times daily.  60 tablet  5  . simvastatin (ZOCOR) 40 MG tablet Take 1 tablet (40 mg total) by mouth every evening.  90 tablet  2  . Thiamine HCl (VITAMIN B-1 PO) Take 1 tablet by mouth daily.       [name] warfarin (COUMADIN) 5 MG tablet Take 5 mg by mouth daily. TAKE AS DIRECTED BY COUMADIN  CLINIC      . zolpidem (AMBIEN) 10 MG tablet Take 1 tablet (10 mg total) by mouth at bedtime as needed for sleep.  30 tablet  0    Results for orders placed during the hospital encounter of 04/02/13 (from the past 48 hour(s))  APTT     Status: None   Collection Time    04/02/13  3:07 PM      Result Value Range   aPTT 36  24 - 37 seconds  BASIC METABOLIC PANEL     Status: Abnormal   Collection Time    04/02/13  3:07 PM      Result Value Range   Sodium 139  135 - 145 mEq/L   Potassium 4.8  3.5 - 5.1 mEq/L   Comment: HEMOLYSIS AT THIS LEVEL MAY AFFECT RESULT   Chloride 101  96 - 112 mEq/L   CO2 27  19 - 32 mEq/L   Glucose, Bld 111 (*) 70 - 99 mg/dL   BUN 24 (*) 6 - 23 mg/dL   Creatinine, Ser 1.61  0.50 - 1.35 mg/dL   Calcium 09.6  8.4 - 04.5 mg/dL   GFR calc non Af Amer 81 (*) >90 mL/min   GFR calc Af Amer >90  >90 mL/min   Comment: (NOTE)     The eGFR has been calculated using the CKD EPI equation.     This calculation has not been validated in all clinical situations.     eGFR's persistently <90 mL/min signify possible Chronic Kidney     Disease.  CBC     Status: None   Collection Time    04/02/13  3:07 PM      Result Value Range   WBC 9.8  4.0 - 10.5 K/uL   RBC 5.00  4.22 - 5.81 MIL/uL   Hemoglobin 14.8  13.0 - 17.0 g/dL   HCT 40.9  81.1 - 91.4 %   MCV 87.2  78.0 - 100.0 fL   MCH 29.6  26.0 - 34.0 pg   MCHC 33.9  30.0 - 36.0 g/dL   RDW 78.2  95.6 - 21.3 %   Platelets 194  150 - 400 K/uL  PROTIME-INR     Status: Abnormal  Collection Time    04/02/13  3:07 PM      Result Value Range   Prothrombin Time 28.0 (*) 11.6 - 15.2 seconds   INR 2.73 (*) 0.00 - 1.49   No results found.  Review of Systems  All other systems reviewed and are negative.    Blood pressure 128/90, pulse 61, temperature 97.1 F (36.2 C), resp. rate 16, SpO2 100.00%. Physical Exam  Constitutional: He is oriented to person, place, and time. He appears well-developed and well-nourished.  HENT:   Head: Normocephalic and atraumatic.  Eyes: EOM are normal. Pupils are equal, round, and reactive to light.  Neck: Normal range of motion. Neck supple.  Cardiovascular: Normal rate.   Respiratory: Effort normal and breath sounds normal.  GI: Soft. Bowel sounds are normal.  Musculoskeletal:       Right knee: He exhibits decreased range of motion, swelling, effusion and ecchymosis.  Neurological: He is alert and oriented to person, place, and time.  Skin: Skin is warm and dry.  Psychiatric: He has a normal mood and affect.     Assessment/Plan Large right knee hematoma on a gentleman post-mechanical fall. He is on coumadin 1)  To the OR urgently for an irrigation and debridement of his right knee hematoma, then admission overnight for observation, antibiotics, etc.  [place] Y 04/02/2013, 5:18 PM

## 2013-04-02 NOTE — Transfer of Care (Signed)
Immediate Anesthesia Transfer of Care Note  Patient: Christian Maxwell  Procedure(s) Performed: Procedure(s): INCISION AND DRAINAGE RIGHT KNEE HEMATOMA (Right)  Patient Location: PACU  Anesthesia Type:General  Level of Consciousness: awake, alert , oriented and patient cooperative  Airway & Oxygen Therapy: Patient Spontanous Breathing and Patient connected to nasal cannula oxygen  Post-op Assessment: Report given to PACU RN, Post -op Vital signs reviewed and stable and Patient moving all extremities  Post vital signs: Reviewed and stable  Complications: No apparent anesthesia complications

## 2013-04-02 NOTE — Anesthesia Procedure Notes (Signed)
Procedure Name: Intubation Date/Time: 04/02/2013 5:37 PM Performed by: Angelica Pou Pre-anesthesia Checklist: Emergency Drugs available, Timeout performed, Patient being monitored, Patient identified and Suction available Patient Re-evaluated:Patient Re-evaluated prior to inductionOxygen Delivery Method: Circle system utilized Preoxygenation: Pre-oxygenation with 100% oxygen Intubation Type: IV induction and Rapid sequence Laryngoscope Size: Mac and 3 Grade View: Grade I Tube type: Oral Tube size: 7.5 mm Number of attempts: 1 Airway Equipment and Method: Stylet Placement Confirmation: ETT inserted through vocal cords under direct vision,  breath sounds checked- equal and bilateral and positive ETCO2 Secured at: 23 cm Tube secured with: Tape Dental Injury: Teeth and Oropharynx as per pre-operative assessment  Comments: Smoth IV induction by Dr Katrinka Blazing; Easy ATOI by Levora Angel CRNA.

## 2013-04-03 MED ORDER — ZOLPIDEM TARTRATE 5 MG PO TABS
5.0000 mg | ORAL_TABLET | Freq: Once | ORAL | Status: AC
  Administered 2013-04-03: 5 mg via ORAL

## 2013-04-03 MED ORDER — OXYCODONE-ACETAMINOPHEN 5-325 MG PO TABS: 1.0000 | ORAL_TABLET | ORAL | Status: DC | PRN

## 2013-04-03 NOTE — Progress Notes (Signed)
Pt BP 93/53 p 88 this AM. Rept to Rexene Edison PA. Per his order, will hold Metoprolol this AM. Will continue to monitor.

## 2013-04-03 NOTE — Anesthesia Postprocedure Evaluation (Signed)
  Anesthesia Post-op Note  Patient: Christian Maxwell  Procedure(s) Performed: Procedure(s): INCISION AND DRAINAGE RIGHT KNEE HEMATOMA (Right)  Patient Location: PACU  Anesthesia Type:General  Level of Consciousness: awake, alert , oriented and patient cooperative  Airway and Oxygen Therapy: Patient Spontanous Breathing  Post-op Pain: mild  Post-op Assessment: Post-op Vital signs reviewed, Patient's Cardiovascular Status Stable, Respiratory Function Stable, Patent Airway, No signs of Nausea or vomiting and Pain level controlled  Post-op Vital Signs: stable  Complications: No apparent anesthesia complications

## 2013-04-03 NOTE — Op Note (Signed)
Christian Maxwell, Christian Maxwell               ACCOUNT NO.:  [id number]  MEDICAL RECORD NO.:  [id number]  LOCATION:  [id number]                        FACILITY:  [facility]  PHYSICIAN:  Vanita Panda. Magnus Ivan, M.D.DATE OF BIRTH:  04-11-1933  DATE OF PROCEDURE:  04/02/2013 DATE OF DISCHARGE:                              OPERATIVE REPORT   PREOPERATIVE DIAGNOSIS:  Expanding hematoma, right knee prepatellar area.  POSTOPERATIVE DIAGNOSIS:  Expanding hematoma, right knee prepatellar area.  FINDINGS:  Large hematoma with extensive clotted blood in the prepatellar bursa and surrounding tissue of the right knee.  PROCEDURE:  Incision and drainage of right knee, large right knee prepatellar hematoma.  SURGEON:  Vanita Panda. Magnus Ivan, M.D.  ASSISTANT:  Richardean Canal, P.A.  ANESTHESIA:  General.  BLOOD LOSS:  Less than 50 mL.  COMPLICATIONS:  None.  INDICATION:  Mr. Deshler is a very pleasant, 79 year old gentleman, who is on chronic Coumadin therapy.  He was going up the stairs last evening and had a mechanical fall, striking his knee against the stairs.  He is a patient of [employer].  Next, he saw Dr. Violeta Gelinas today who he has seen for numerous things over the years and Dr. [name] recognized he had a large hematoma of his knee and possibly hemarthrosis as well.  He contacted me and appropriately sent to my office just after he saw Dr. [name] and I evaluated the knee.  In the office, I attempted to aspirate the knee joint in the prepatellar bursa area, but was unsuccessful due to the large hematoma too difficult to get out through just 18-gauge needle.  The hematoma seemed to be expanding, and he is becoming quite uncomfortable, so I recommended he undergo incision and drainage in the operative environment, because of the things to be save for him, given his Coumadin, we could do it with a tourniquet and thoroughly wash things out.  My concern is stretching of the soft  tissues could lead to necrosis of the soft tissues and the concern about infection given his medical history.  PROCEDURE DESCRIPTION:  After informed consent was obtained, appropriate right knee was marked.  He was brought to the operating room, placed supine on the operating table.  General anesthesia was then obtained.  A nonsterile tourniquet was placed around his upper right thigh and his right leg was prepped and draped from the thigh down to the ankle with DuraPrep and sterile drapes including sterile stockinette.  Time-out was called to identify the correct patient, correct right knee.  I then had the tourniquet inflated to 300 mm of pressure.  I made a midline incision over the patella and carried this proximally and distally and encountered a large hematoma of at least more than 100 mL of gelatinous hematoma, dark clotted material consistent with hematoma and trauma for someone being on Coumadin.  Once I completely evacuated this, I used 2 L of normal saline solution and pulsatile lavage to lavage the prepatellar area.  I then dried it extensively and looked for any significant geniculate arterial bleeds or vein bleed, but could not find any.  I then placed a medium Hemovac drain in the prepatellar area and reapproximated the skin with  interrupted 2-0 nylon suture.  The tourniquet was let down.  The toes pink and there was no extensive bleeding.  We placed Xeroform and a well-padded sterile dressing, and then Ace wrap from his thigh down to his toes.  He was awakened, extubated, and taken to recovery room in stable condition.  All final counts were correct.  There were no complications noted.  Postop plan, we will resume his Coumadin but keep him definitely for 24- hour observation over night for ice and elevation as well.  I would like to remove the Hemovac tomorrow and then send him home with weightbearing as tolerated with compressive dressing for the next few  days.     Vanita Panda. Magnus Ivan, M.D.     CYB/MEDQ  D:  04/02/2013  T:  04/03/2013  Job:  [id number]  cc:   Gabrielle Dare. [name], M.D.

## 2013-04-03 NOTE — Discharge Summary (Signed)
Patient ID: BECKEM TOMBERLIN MRN: [id number] DOB/AGE: [birth year] 79 y.o.  Admit date: 04/02/2013 Discharge date: 04/03/2013  Admission Diagnoses:  Principal Problem:   Traumatic hematoma of right knee   Discharge Diagnoses:  S/p Incision and Drainage of right knee traumatic hematoma  Past Medical History  Diagnosis Date  . Hyperlipidemia   . Syncope and collapse     pt denies  . Atrial fibrillation   . PVC's (premature ventricular contractions)   . Abscess 7/13    abd abscess  . History of colon cancer 2000    T3N0 s/p colectomy  . Normal nuclear stress test 2004  . METHICILLIN RESISTANT STAPHYLOCOCCUS AUREUS INFECTION 12/12/2006    Annotation: stitch abcess in lower abdomen after colon cancer resection Qualifier: Diagnosis of  By: [name] MD, [name]    . Postoperative stitch abscess 07/19/2011  . Hx of adenomatous colonic polyps   . Thyroid disease     followed by Dr. Evlyn Kanner  . Insomnia   . Skin abscess     recurrent  . Hypertension     pt states he does not have this. 02/26/13  . Myocardial infarction     at age 54 years  . Cancer     colon cancer 2000  . Arthritis     bursitis in left shoulder    Surgeries: Procedure(s): INCISION AND DRAINAGE RIGHT KNEE HEMATOMA on 04/02/2013   Consultants:    Discharged Condition: Improved  Hospital Course: KATSUMI WISLER is an 79 y.o. male who was admitted 04/02/2013 for operative treatment ofTraumatic hematoma of right knee. Patient has severe unremitting pain that affects sleep, daily activities, and work/hobbies. After pre-op clearance the patient was taken to the operating room on 04/02/2013 and underwent  Procedure(s): INCISION AND DRAINAGE RIGHT KNEE HEMATOMA.    Patient was given perioperative antibiotics: Anti-infectives   Start     Dose/Rate Route Frequency Ordered Stop   04/02/13 2330  ceFAZolin (ANCEF) IVPB 1 g/50 mL premix     1 g 100 mL/hr over 30 Minutes Intravenous Every 6 hours 04/02/13 1943 04/03/13 1129    04/02/13 1515  ceFAZolin (ANCEF) IVPB 2 g/50 mL premix  Status:  Discontinued     2 g 100 mL/hr over 30 Minutes Intravenous On call to O.R. 04/02/13 1447 04/02/13 1943       Patient was given sequential compression devices, early ambulation, and chemoprophylaxis to prevent DVT.  Patient benefited maximally from hospital stay and there were no complications.    Recent vital signs: Patient Vitals for the past 24 hrs:  BP Temp Pulse Resp SpO2  04/03/13 1005 93/53 mmHg - 88 - -  04/03/13 0454 106/56 mmHg 98.1 F (36.7 C) 89 16 97 %  04/02/13 2100 107/60 mmHg 98.4 F (36.9 C) 60 16 98 %  04/02/13 1953 118/75 mmHg 97.9 F (36.6 C) 83 16 99 %  04/02/13 1915 - 97 F (36.1 C) - - -  04/02/13 1815 - 98.2 F (36.8 C) - - 100 %  04/02/13 1459 128/90 mmHg 97.1 F (36.2 C) 61 16 100 %     Recent laboratory studies:  Recent Labs  04/02/13 1507  WBC 9.8  HGB 14.8  HCT 43.6  PLT 194  NA 139  K 4.8  CL 101  CO2 27  BUN 24*  CREATININE 0.87  GLUCOSE 111*  INR 2.73*  CALCIUM 10.1     Discharge Medications:     Medication List  ARICEPT 10 MG tablet  Generic drug:  donepezil  Take 10 mg by mouth at bedtime.     aspirin 81 MG tablet  Take 162 mg by mouth daily.     CALCIUM PO  Take 1 tablet by mouth daily.     doxycycline 50 MG capsule  Commonly known as:  VIBRAMYCIN  Take 1 capsule (50 mg total) by mouth 2 (two) times daily.     fish oil-omega-3 fatty acids 1000 MG capsule  Take 1 g by mouth daily.     folic acid 1 MG tablet  Commonly known as:  FOLVITE  Take 1 mg by mouth daily.     metoprolol tartrate 25 MG tablet  Commonly known as:  LOPRESSOR  Take 1 tablet (25 mg total) by mouth 2 (two) times daily.     oxyCODONE-acetaminophen 5-325 MG per tablet  Commonly known as:  ROXICET  Take 1-2 tablets by mouth every 4 (four) hours as needed for severe pain.     simvastatin 40 MG tablet  Commonly known as:  ZOCOR  Take 1 tablet (40 mg total) by mouth  every evening.     VITAMIN B-1 PO  Take 1 tablet by mouth daily.     warfarin 5 MG tablet  Commonly known as:  COUMADIN  Take 5 mg by mouth daily. TAKE AS DIRECTED BY COUMADIN CLINIC     zolpidem 10 MG tablet  Commonly known as:  AMBIEN  Take 1 tablet (10 mg total) by mouth at bedtime as needed for sleep.        Diagnostic Studies: No results found.  Disposition: 01-Home or Self Care      Discharge Orders   Future Appointments Provider Department Dept Phone   04/14/2013 2:45 PM [facility] [place] Office [phone]   04/23/2013 9:10 AM [name], MD [facility], [place] [phone]   09/03/2013 7:15 AM Eustaquio Boyden, MD [facility] [phone]   Future Orders Complete By Expires   Discharge wound care:  As directed    Comments:     Change dressing on Fridaymay shower with new dressing intact.Keep dressing cleanand intact until Monday, then remove and shower. Apply clean dressing after showering.   Elevate operative extremity  As directed    Comments:     Encourage patient to wiggle toes often   Weight bearing as tolerated  As directed    Questions:     Laterality:     Extremity:     Weight bearing as tolerated  As directed    Questions:     Laterality:     Extremity:        Follow-up Information   Follow up with Kathryne Hitch, MD In 2 weeks.   Specialty:  Orthopedic Surgery   Contact information:   [address] [place] [address] [phone]        Signed: Richardean Canal 04/03/2013, 2:24 PM   PATIENT ID:      Christian Maxwell  MRN:     [id number] DOB/AGE:    12-Jan-1933 / 79 y.o.     DISCHARGE SUMMARY  ADMISSION DATE:    04/02/2013 DISCHARGE DATE:   04/03/2013   ADMISSION DIAGNOSIS: Large expanding hematoma right knee  DISCHARGE DIAGNOSIS:  Large expanding hematoma right knee    ADDITIONAL DIAGNOSIS: Principal Problem:   Traumatic hematoma of right  knee  Past Medical History  Diagnosis Date  . Hyperlipidemia   .  Syncope and collapse     pt denies  . Atrial fibrillation   . PVC's (premature ventricular contractions)   . Abscess 7/13    abd abscess  . History of colon cancer 2000    T3N0 s/p colectomy  . Normal nuclear stress test 2004  . METHICILLIN RESISTANT STAPHYLOCOCCUS AUREUS INFECTION 12/12/2006    Annotation: stitch abcess in lower abdomen after colon cancer resection Qualifier: Diagnosis of  By: [name] MD, [name]    . Postoperative stitch abscess 07/19/2011  . Hx of adenomatous colonic polyps   . Thyroid disease     followed by Dr. Evlyn Kanner  . Insomnia   . Skin abscess     recurrent  . Hypertension     pt states he does not have this. 02/26/13  . Myocardial infarction     at age 41 years  . Cancer     colon cancer 2000  . Arthritis     bursitis in left shoulder    PROCEDURE: Procedure(s): INCISION AND DRAINAGE RIGHT KNEE HEMATOMA on 04/02/2013  CONSULTS:     HISTORY: Traumatic right knee hematoma See H&P in chart  HOSPITAL COURSE:  JHAYDEN DEMURO is a 79 y.o. admitted on 04/02/2013 and found to have a diagnosis of Large expanding hematoma right knee.  After appropriate laboratory studies were obtained  they were taken to the operating room on 04/02/2013 and underwent Procedure(s): INCISION AND DRAINAGE RIGHT KNEE HEMATOMA.   They were given perioperative antibiotics:  Anti-infectives   Start     Dose/Rate Route Frequency Ordered Stop   04/02/13 2330  ceFAZolin (ANCEF) IVPB 1 g/50 mL premix     1 g 100 mL/hr over 30 Minutes Intravenous Every 6 hours 04/02/13 1943 04/03/13 1129   04/02/13 1515  ceFAZolin (ANCEF) IVPB 2 g/50 mL premix  Status:  Discontinued     2 g 100 mL/hr over 30 Minutes Intravenous On call to O.R. 04/02/13 1447 04/02/13 1943    .   The remainder of the hospital course was dedicated to ambulation and strengthening.   The patient was discharged on 1 Day Post-Op in stable  condition.   DIAGNOSTIC STUDIES: Recent vital signs: Temp:  [97 F (36.1 C)-98.4 F (36.9 C)] 98.1 F (36.7 C) (11/20 0454) Pulse Rate:  [60-89] 88 (11/20 1005) Resp:  [16] 16 (11/20 0454) BP: (93-128)/(53-90) 93/53 mmHg (11/20 1005) SpO2:  [97 %-100 %] 97 % (11/20 0454)    Recent laboratory studies:  Recent Labs  04/02/13 1507  WBC 9.8  HGB 14.8  HCT 43.6  PLT 194    Recent Labs  04/02/13 1507  NA 139  K 4.8  CL 101  CO2 27  BUN 24*  CREATININE 0.87  GLUCOSE 111*  CALCIUM 10.1   Lab Results  Component Value Date   INR 2.73* 04/02/2013   INR 2.9 03/31/2013   INR 1.6 03/24/2013     Recent Radiographic Studies :  No results found.  DISCHARGE INSTRUCTIONS: Discharge Orders   Future Appointments Provider Department Dept Phone   04/14/2013 2:45 PM [facility] [place] Office [phone]   04/23/2013 9:10 AM [name], MD [facility], [place] [phone]   09/03/2013 7:15 AM Eustaquio Boyden, MD [facility] [phone]   Future Orders Complete By Expires   Discharge wound care:  As directed    Comments:     Change dressing on Fridaymay shower with new dressing intact.Keep dressing cleanand intact until  Monday, then remove and shower. Apply clean dressing after showering.   Elevate operative extremity  As directed    Comments:     Encourage patient to wiggle toes often   Weight bearing as tolerated  As directed    Questions:     Laterality:     Extremity:     Weight bearing as tolerated  As directed    Questions:     Laterality:     Extremity:        DISCHARGE MEDICATIONS:     Medication List         ARICEPT 10 MG tablet  Generic drug:  donepezil  Take 10 mg by mouth at bedtime.     aspirin 81 MG tablet  Take 162 mg by mouth daily.     CALCIUM PO  Take 1 tablet by mouth daily.     doxycycline 50 MG capsule  Commonly known as:  VIBRAMYCIN  Take 1 capsule (50 mg total) by  mouth 2 (two) times daily.     fish oil-omega-3 fatty acids 1000 MG capsule  Take 1 g by mouth daily.     folic acid 1 MG tablet  Commonly known as:  FOLVITE  Take 1 mg by mouth daily.     metoprolol tartrate 25 MG tablet  Commonly known as:  LOPRESSOR  Take 1 tablet (25 mg total) by mouth 2 (two) times daily.     oxyCODONE-acetaminophen 5-325 MG per tablet  Commonly known as:  ROXICET  Take 1-2 tablets by mouth every 4 (four) hours as needed for severe pain.     simvastatin 40 MG tablet  Commonly known as:  ZOCOR  Take 1 tablet (40 mg total) by mouth every evening.     VITAMIN B-1 PO  Take 1 tablet by mouth daily.     warfarin 5 MG tablet  Commonly known as:  COUMADIN  Take 5 mg by mouth daily. TAKE AS DIRECTED BY COUMADIN CLINIC     zolpidem 10 MG tablet  Commonly known as:  AMBIEN  Take 1 tablet (10 mg total) by mouth at bedtime as needed for sleep.        FOLLOW UP VISIT:       Follow-up Information   Follow up with Kathryne Hitch, MD In 2 weeks.   Specialty:  Orthopedic Surgery   Contact information:   [address] [place] [address] [phone]       DISPOSITION:   01-Home or Self Care    Richardean Canal 04/03/2013, 2:24 PM

## 2013-04-03 NOTE — Progress Notes (Signed)
Physical Therapy Discharge Patient Details Name: Christian Maxwell MRN: [id number] DOB: November 21, 1932 Today's Date: 04/03/2013 Time: 4098-1191 PT Time Calculation (min): 11 min  Patient discharged from PT services secondary to goals met and no further PT needs identified.  Please see latest therapy progress note for current level of functioning and progress toward goals.    Progress and discharge plan discussed with patient and/or caregiver: Patient/Caregiver agrees with plan  GP Functional Assessment Tool Used: clinical judgement Functional Limitation: Mobility: Walking and moving around Mobility: Walking and Moving Around Current Status (Y7829): At least 1 percent but less than 20 percent impaired, limited or restricted Mobility: Walking and Moving Around Goal Status [phone]): At least 1 percent but less than 20 percent impaired, limited or restricted Mobility: Walking and Moving Around Discharge Status [phone]): At least 1 percent but less than 20 percent impaired, limited or restricted   Vena Austria 04/03/2013, 12:39 PM

## 2013-04-03 NOTE — Progress Notes (Signed)
Subjective: 1 Day Post-Op Procedure(s) (LRB): INCISION AND DRAINAGE RIGHT KNEE HEMATOMA (Right) Patient reports pain as mild.  No complaints.  Objective: Vital signs in last 24 hours: Temp:  [97 F (36.1 C)-98.4 F (36.9 C)] 98.1 F (36.7 C) (11/20 0454) Pulse Rate:  [60-89] 89 (11/20 0454) Resp:  [16] 16 (11/20 0454) BP: (106-132)/(56-90) 106/56 mmHg (11/20 0454) SpO2:  [97 %-100 %] 97 % (11/20 0454) Weight:  [87.181 kg (192 lb 3.2 oz)] 87.181 kg (192 lb 3.2 oz) (11/19 0948)  Intake/Output from previous day: 11/19 0701 - 11/20 0700 In: 1100 [I.V.:1000; IV Piggyback:100] Out: 100 [Drains:100] Intake/Output this shift:     Recent Labs  04/02/13 1507  HGB 14.8    Recent Labs  04/02/13 1507  WBC 9.8  RBC 5.00  HCT 43.6  PLT 194    Recent Labs  04/02/13 1507  NA 139  K 4.8  CL 101  CO2 27  BUN 24*  CREATININE 0.87  GLUCOSE 111*  CALCIUM 10.1    Recent Labs  03/31/13 1059 04/02/13 1507  INR 2.9 2.73*    Neurovascular intact Sensation intact distally Intact pulses distally Dorsiflexion/Plantar flexion intact Incision: dressing C/D/I Compartment soft  Assessment/Plan: 1 Day Post-Op Procedure(s) (LRB): INCISION AND DRAINAGE RIGHT KNEE HEMATOMA (Right) Up with therapy Discharge home if patient does well with PT Hemovac d/c'd  CLARK, GILBERT 04/03/2013, 8:36 AM

## 2013-04-03 NOTE — Evaluation (Signed)
Physical Therapy Evaluation Patient Details Name: Christian Maxwell MRN: [id number] DOB: 1933-03-07 Today's Date: 04/03/2013 Time: 1214-1225 PT Time Calculation (min): 11 min  PT Assessment / Plan / Recommendation History of Present Illness  Patient is a 79 yo male s/p I&D of Rt knee hematoma.   Patient tripped going down steps at home (carrying item and not holding rail).  Clinical Impression  Patient at mod I level with all mobility and gait.  Educated patient to move more slowly for safety and always use rails on steps.  No further acute PT needs identified - PT will sign off.    PT Assessment  Patent does not need any further PT services    Follow Up Recommendations  No PT follow up;Supervision/Assistance - 24 hour    Does the patient have the potential to tolerate intense rehabilitation      Barriers to Discharge        Equipment Recommendations  None recommended by PT    Recommendations for Other Services     Frequency      Precautions / Restrictions Precautions Precautions: Fall Restrictions Weight Bearing Restrictions: Yes RLE Weight Bearing: Weight bearing as tolerated   Pertinent Vitals/Pain       Mobility  Bed Mobility Bed Mobility: Supine to Sit;Sitting - Scoot to Edge of Bed Supine to Sit: 6: Modified independent (Device/Increase time);With rails Sitting - Scoot to Edge of Bed: 7: Independent Details for Bed Mobility Assistance: Verbal cues to slow down and move at safe pace. Transfers Transfers: Sit to Stand;Stand to Sit Sit to Stand: 6: Modified independent (Device/Increase time);From bed Stand to Sit: 6: Modified independent (Device/Increase time);To bed Details for Transfer Assistance: Cues to move at slow pace for safety. Ambulation/Gait Ambulation/Gait Assistance: 6: Modified independent (Device/Increase time) Ambulation Distance (Feet): 200 Feet Assistive device: None Ambulation/Gait Assistance Details: Patient with fairly good balance with  gait.  Cues to move at slow, safe speed. Gait Pattern: Step-through pattern;Antalgic Stairs: Yes Stairs Assistance: 5: Supervision Stairs Assistance Details (indicate cue type and reason): Instructed patient to use step-to gait pattern and always use rail for safety Stair Management Technique: One rail Right;Step to pattern;Forwards Number of Stairs: 4        PT Goals(Current goals can be found in the care plan section)  N/A  Visit Information  Last PT Received On: 04/03/13 Assistance Needed: +1 History of Present Illness: Patient is a 79 yo male s/p I&D of Rt knee hematoma.   Patient tripped going down steps at home (carrying item and not holding rail).       Prior Functioning  Home Living Family/patient expects to be discharged to:: Private residence Living Arrangements: Spouse/significant other Available Help at Discharge: Family;Available 24 hours/day Type of Home: House Home Access: Stairs to enter [employer] of Steps: 5 Entrance Stairs-Rails: Right;Left Home Layout: Two level Alternate Level Stairs-Number of Steps: 7 Alternate Level Stairs-Rails: Right;Left Home Equipment: None Prior Function Level of Independence: Independent Comments: Very independent - driving Communication Communication: No difficulties    Cognition  Cognition Arousal/Alertness: Awake/alert Behavior During Therapy: Impulsive;Anxious Overall Cognitive Status: Within Functional Limits for tasks assessed    Extremity/Trunk Assessment Upper Extremity Assessment Upper Extremity Assessment: Overall WFL for tasks assessed Lower Extremity Assessment Lower Extremity Assessment: RLE deficits/detail RLE Deficits / Details: Decreased knee strength and ROM due to surgery/bandaging.  Able to move against gravity. Cervical / Trunk Assessment Cervical / Trunk Assessment: Normal   Balance Balance Balance Assessed: Yes High Level Balance High  Level Balance Activites: Turns;Sudden stops;Head  turns High Level Balance Comments: No loss of balance with high level activities  End of Session PT - End of Session Equipment Utilized During Treatment: Gait belt Activity Tolerance: Patient tolerated treatment well Patient left: in bed;with call bell/phone within reach;with family/visitor present (sitting at EOB) Nurse Communication: Mobility status  GP Functional Assessment Tool Used: clinical judgement Functional Limitation: Mobility: Walking and moving around Mobility: Walking and Moving Around Current Status ([id number]): At least 1 percent but less than 20 percent impaired, limited or restricted Mobility: Walking and Moving Around Goal Status [phone]): At least 1 percent but less than 20 percent impaired, limited or restricted Mobility: Walking and Moving Around Discharge Status [phone]): At least 1 percent but less than 20 percent impaired, limited or restricted   Vena Austria 04/03/2013, 12:36 PM Durenda Hurt. Renaldo Fiddler, [facility] Acute Rehab Services Pager [phone]

## 2013-04-04 ENCOUNTER — Encounter (HOSPITAL_COMMUNITY): Payer: Self-pay | Admitting: Orthopaedic Surgery

## 2013-04-14 ENCOUNTER — Ambulatory Visit (INDEPENDENT_AMBULATORY_CARE_PROVIDER_SITE_OTHER): Payer: Medicare Other | Admitting: *Deleted

## 2013-04-14 DIAGNOSIS — I4891 Unspecified atrial fibrillation: Secondary | ICD-10-CM | POA: Diagnosis not present

## 2013-04-14 LAB — POCT INR: INR: 2.3

## 2013-04-23 ENCOUNTER — Ambulatory Visit (INDEPENDENT_AMBULATORY_CARE_PROVIDER_SITE_OTHER): Payer: Medicare Other | Admitting: General Surgery

## 2013-04-23 ENCOUNTER — Encounter (INDEPENDENT_AMBULATORY_CARE_PROVIDER_SITE_OTHER): Payer: Self-pay | Admitting: General Surgery

## 2013-04-23 ENCOUNTER — Encounter (INDEPENDENT_AMBULATORY_CARE_PROVIDER_SITE_OTHER): Payer: Self-pay

## 2013-04-23 VITALS — BP 110/70 | HR 78 | Resp 18 | Wt 189.5 lb

## 2013-04-23 DIAGNOSIS — IMO0002 Reserved for concepts with insufficient information to code with codable children: Secondary | ICD-10-CM | POA: Diagnosis not present

## 2013-04-23 DIAGNOSIS — L02219 Cutaneous abscess of trunk, unspecified: Secondary | ICD-10-CM | POA: Diagnosis not present

## 2013-04-23 NOTE — Progress Notes (Signed)
Subjective:     Patient ID: Christian Maxwell, male   DOB: [birth year], 79 y.o.   MRN: [id number]  HPI Patient presents for follow up of left arm and suprapubic abscess. After his last visit here, I referred him to Dr. Allie Bossier and he underwent incision and drainage of large right knee hematoma. That is doing much better. He feels his arm is doing well. He is still having a small amount of drainage from the suprapubic area.  Review of Systems     Objective:   Physical Exam Left arm incision and drainage site is now closed. No evidence of ongoing infection. Right knee had a small orthopedic dressing and appears much better than on his last visit Suprapubic region still has a tiny open sinus with clean granulation tissue there is serosanguineous drainage, no cellulitis, no erythema, no evidence of ongoing infection. The small bowel is only a couple millimeters across and less than 5 mm deep.    Assessment:     Left arm abscess healed. Improvement but incomplete resolution of suprapubic sinus drainage.    Plan:     Supplementary was applied to the suprapubic sinus. See him back next month. If this does not totally closed, we will consider trying to re\re excise the area in the operating room. I also spoke to his wife on the phone per his request.

## 2013-05-13 ENCOUNTER — Ambulatory Visit (INDEPENDENT_AMBULATORY_CARE_PROVIDER_SITE_OTHER): Payer: Medicare Other | Admitting: Pharmacist

## 2013-05-13 DIAGNOSIS — I4891 Unspecified atrial fibrillation: Secondary | ICD-10-CM

## 2013-06-04 ENCOUNTER — Encounter (INDEPENDENT_AMBULATORY_CARE_PROVIDER_SITE_OTHER): Payer: Medicare Other | Admitting: General Surgery

## 2013-06-17 ENCOUNTER — Ambulatory Visit (INDEPENDENT_AMBULATORY_CARE_PROVIDER_SITE_OTHER): Payer: Medicare Other | Admitting: Pharmacist

## 2013-06-17 DIAGNOSIS — Z5181 Encounter for therapeutic drug level monitoring: Secondary | ICD-10-CM | POA: Diagnosis not present

## 2013-06-17 DIAGNOSIS — I4891 Unspecified atrial fibrillation: Secondary | ICD-10-CM | POA: Diagnosis not present

## 2013-06-17 LAB — POCT INR: INR: 3.4

## 2013-06-19 ENCOUNTER — Encounter: Payer: Self-pay | Admitting: Cardiovascular Disease

## 2013-06-19 ENCOUNTER — Ambulatory Visit (INDEPENDENT_AMBULATORY_CARE_PROVIDER_SITE_OTHER): Payer: Medicare Other | Admitting: Cardiovascular Disease

## 2013-06-19 VITALS — BP 106/64 | HR 70 | Ht 70.0 in | Wt 191.4 lb

## 2013-06-19 DIAGNOSIS — I4891 Unspecified atrial fibrillation: Secondary | ICD-10-CM

## 2013-06-19 NOTE — Assessment & Plan Note (Signed)
Aser says that he is doing OK but his wife said that he will occasionally have chest pain and dyspnea in the afternoon - especially if does not take his 2nd metoprolol on time.  I suggested that we do a stress myvoiew but he refuses.   Even after much discussion from me and his wife, he insists that he is fine and does not need a stress test.    I told him that if this is due to coronary ischemia that his symptoms will probably worsen. It will be important for him to call me if his symptoms worsen. I'll see him again in one year for followup visit.

## 2013-06-19 NOTE — Progress Notes (Signed)
Christian Maxwell Date of Birth  1933-05-10 [place] [address]    [address] [place], [place]  [address]    [place], [place]  [address] [phone]  Fax  [phone]  [phone]  Fax [phone]   History of Present Illness:  Problem list: 1. Atrial fibrillation 2. Hyperlipidemia 3. Premature ventricular contractions 4. Hypothyroidism    Christian Maxwell is a 80 year old gentleman with a history of atrial fibrillation, hyperlipidemia, and a history of PVCs. He exercises on a regular basis. He has not had any episodes of chest pain or shortness of breath. He denies any syncope or presyncope.  He's doing much better. He was having some shortness of breath earlier in the year and the end of last year.  Dec. 4, 2013 :   He presents today with some right sided chest wall pain.   The discomfort increases with deep breath, right arm movement and with twisting of his torso.  No dyspnea.  No angina.    May 22, 2011: He's doing very well at this point. He is no longer having any episodes of chest wall pain.  He still rides a stationary bike for 30 minutes twice a day and does not have any episodes of pain.  Feb. 5, 2015:  [name] has no complaints.   He was seen with his wife.  It is clear that he is gradually getting more and more demented.   His wife states that he breathes heavily when climbing stairs. He's able to do all of his normal activities without any significant difficulties.  He does notice that he'll typically develop some chest pain that she's late taking his second metoprolol dose of the day.      Current Outpatient Prescriptions on File Prior to Visit  Medication Sig Dispense Refill  . aspirin 81 MG tablet Take 162 mg by mouth daily.       [name] CALCIUM PO Take 1 tablet by mouth daily.       [name] donepezil (ARICEPT) 10 MG tablet Take 10 mg by mouth at bedtime.      [name] doxycycline (VIBRAMYCIN) 50 MG capsule Take 1 capsule (50  mg total) by mouth 2 (two) times daily.  28 capsule  0  . fish oil-omega-3 fatty acids 1000 MG capsule Take 1 g by mouth daily.       . folic acid (FOLVITE) 1 MG tablet Take 1 mg by mouth daily.      . metoprolol tartrate (LOPRESSOR) 25 MG tablet Take 1 tablet (25 mg total) by mouth 2 (two) times daily.  60 tablet  5  . oxyCODONE-acetaminophen (ROXICET) 5-325 MG per tablet Take 1-2 tablets by mouth every 4 (four) hours as needed for severe pain.  60 tablet  0  . simvastatin (ZOCOR) 40 MG tablet Take 1 tablet (40 mg total) by mouth every evening.  90 tablet  2  . Thiamine HCl (VITAMIN B-1 PO) Take 1 tablet by mouth daily.       [name] warfarin (COUMADIN) 5 MG tablet Take 5 mg by mouth daily. TAKE AS DIRECTED BY COUMADIN CLINIC      . zolpidem (AMBIEN) 10 MG tablet Take 1 tablet (10 mg total) by mouth at bedtime as needed for sleep.  30 tablet  0   No current facility-administered medications on file prior to visit.    No Known Allergies  Past Medical History  Diagnosis Date  .  Hyperlipidemia   . Syncope and collapse     pt denies  . Atrial fibrillation   . PVC's (premature ventricular contractions)   . Abscess 7/13    abd abscess  . History of colon cancer 2000    T3N0 s/p colectomy  . Normal nuclear stress test 2004  . METHICILLIN RESISTANT STAPHYLOCOCCUS AUREUS INFECTION 12/12/2006    Annotation: stitch abcess in lower abdomen after colon cancer resection Qualifier: Diagnosis of  By: [name] MD, [name]    . Postoperative stitch abscess 07/19/2011  . Hx of adenomatous colonic polyps   . Thyroid disease     followed by [name]  . Insomnia   . Skin abscess     recurrent  . Hypertension     pt states he does not have this. 02/26/13  . Myocardial infarction     at age 46 years  . Cancer     colon cancer 2000  . Arthritis     bursitis in left shoulder    Past Surgical History  Procedure Laterality Date  . Lumbar laminectomy  1977  . Wound exploration  2006    For possible  stitch abscess Dr [name]  . Incise and drain abcess  2009,2010,2011,2012  . Colectomy  2000    6 inches removed  . Mass excision  03/07/2012    EXCISION MASS;  Surgeon: [name], MD;  Laterality: Right;  removal mass posterior right arm  . Polypectomy    . Colonoscopy  2012    per patient (Dr. [name])  . Incision and drainage abscess N/A 09/20/2012    Procedure: INCISION AND DRAINAGE SUPRAPUBIC ABSCESS;  Surgeon: [name], MD;  Location: [facility];  Service: General;  Laterality: N/A;  . Hernia repair  [id number]    DB umbilical hernia  . Eye surgery      cataract surgery both eyes  . Incision and drainage abscess Left 02/28/2013    Procedure: INCISION AND DRAINAGE LEFT ARM ABSCESS;  Surgeon: [name], MD;  Location: [facility];  Service: General;  Laterality: Left;  . Incision and drainage Right 04/02/2013    Procedure: INCISION AND DRAINAGE RIGHT KNEE HEMATOMA;  Surgeon: [name], MD;  Location: [facility];  Service: Orthopedics;  Laterality: Right;    History  Smoking status  . Former Smoker  . Quit date: 10/13/1977  Smokeless tobacco  . Former Systems developer  . Types: Chew  . Quit date: 10/13/1977    History  Alcohol Use No    Family History  Problem Relation Age of Onset  . Stroke Mother   . Stroke Father   . Hypertension Father   . CAD Father 58    MI  . Stroke Sister 5  . Stroke Brother   . Stroke Brother   . Diabetes Brother   . CAD Brother 73    MI  . Cancer Neg Hx     Reviw of Systems:  Reviewed in the HPI.  All other systems are negative.  Physical Exam: BP 106/64  Pulse 70  Ht 5\' 10"  (1.778 m)  Wt 191 lb 6.4 oz (86.818 kg)  BMI 27.46 kg/m2 The patient is alert and oriented x 3.  The mood and affect are normal.   Skin: warm and dry.  Color is normal.   HEENT:   Normocephalic/atraumatic. No JVD. His carotids are normal. Lungs: Lungs are clear.  Heart: Irregularly irregular. He has no murmurs.   The right side of  his chest has significant chest wall tenderness.  Compression of his rib cage on caused worsening of his rib pain. Abdomen: Is good bowel sounds. There is no hepatomegaly Extremities:  No clubbing cyanosis or edema.   Neuro:  Exam is nonfocal. His gait is normal.    ECG:  Assessment / Plan:

## 2013-06-19 NOTE — Patient Instructions (Signed)
Your physician wants you to follow-up in: 1 year  You will receive a reminder letter in the mail two months in advance. If you don't receive a letter, please call our office to schedule the follow-up appointment.  Your physician recommends that you continue on your current medications as directed. Please refer to the Current Medication list given to you today.  

## 2013-06-19 NOTE — Assessment & Plan Note (Signed)
He appears to be stable.  HR and BP are ok.   Continue coumadin

## 2013-06-30 ENCOUNTER — Encounter (INDEPENDENT_AMBULATORY_CARE_PROVIDER_SITE_OTHER): Payer: Self-pay | Admitting: General Surgery

## 2013-06-30 ENCOUNTER — Ambulatory Visit (INDEPENDENT_AMBULATORY_CARE_PROVIDER_SITE_OTHER): Payer: Medicare Other | Admitting: General Surgery

## 2013-06-30 VITALS — BP 110/70 | HR 72 | Resp 14 | Ht 70.0 in | Wt 194.6 lb

## 2013-06-30 DIAGNOSIS — IMO0002 Reserved for concepts with insufficient information to code with codable children: Secondary | ICD-10-CM | POA: Diagnosis not present

## 2013-06-30 DIAGNOSIS — T8149XA Infection following a procedure, other surgical site, initial encounter: Secondary | ICD-10-CM

## 2013-06-30 DIAGNOSIS — L02219 Cutaneous abscess of trunk, unspecified: Secondary | ICD-10-CM

## 2013-06-30 DIAGNOSIS — L03319 Cellulitis of trunk, unspecified: Secondary | ICD-10-CM

## 2013-06-30 DIAGNOSIS — T8141XA Infection following a procedure, superficial incisional surgical site, initial encounter: Secondary | ICD-10-CM

## 2013-06-30 NOTE — Progress Notes (Signed)
Subjective:     Patient ID: Christian Maxwell, male   DOB: 15-Feb-1933, 80 y.o.   MRN: [id number]  HPI Patient presents for followup of left arm abscess and suprapubic wound. Left arm abscess is doing well according to him. No changes in the wound at the base of his penis.  Review of Systems     Objective:   Physical Exam  Constitutional: He appears well-developed and well-nourished.  Cardiovascular:  Irregularly irregular  Pulmonary/Chest: Effort normal.  Abdominal: Soft. He exhibits no distension. There is no tenderness.  A small 2 mm wound at base of penis with a small amount of granulation tissue and minimal serosanguineous drainage is unchanged, no evidence of cellulitis, no induration  Musculoskeletal:  Left arm I&D site is almost completely healed. There is some redness consistent with mild irritation       Assessment:     Arm abscess healed - leave open without dressing. Chronic draining sinus at base of penis unchanged    Plan:     I again offered reexcision of this area at the base of his penis. He does not want to go to surgery. I spoke with his wife on the phone. We decided to reimage and make sure there is no osteomyelitis. Previous MR showed no osteomyelitis. He has had infectious disease workup both at [facility] [place] at [facility] as well.Will get followup MR pelvis and I will call him with the results. We will plan accordingly.

## 2013-07-02 ENCOUNTER — Other Ambulatory Visit: Payer: Self-pay | Admitting: *Deleted

## 2013-07-02 MED ORDER — METOPROLOL TARTRATE 25 MG PO TABS: 25.0000 mg | ORAL_TABLET | Freq: Two times a day (BID) | ORAL | Status: DC

## 2013-07-14 ENCOUNTER — Ambulatory Visit
Admission: RE | Admit: 2013-07-14 | Discharge: 2013-07-14 | Disposition: A | Discharge status: Home / Self Care | Payer: Medicare Other | Source: Ambulatory Visit | Attending: General Surgery | Admitting: General Surgery

## 2013-07-14 DIAGNOSIS — R609 Edema, unspecified: Secondary | ICD-10-CM | POA: Diagnosis not present

## 2013-07-14 DIAGNOSIS — L02219 Cutaneous abscess of trunk, unspecified: Secondary | ICD-10-CM

## 2013-07-14 MED ORDER — GADOBENATE DIMEGLUMINE 529 MG/ML IV SOLN
18.0000 mL | Freq: Once | INTRAVENOUS | Status: AC | PRN
  Administered 2013-07-14: 18 mL via INTRAVENOUS

## 2013-07-15 ENCOUNTER — Ambulatory Visit (INDEPENDENT_AMBULATORY_CARE_PROVIDER_SITE_OTHER): Payer: Medicare Other | Admitting: *Deleted

## 2013-07-15 DIAGNOSIS — I4891 Unspecified atrial fibrillation: Secondary | ICD-10-CM | POA: Diagnosis not present

## 2013-07-15 DIAGNOSIS — Z5181 Encounter for therapeutic drug level monitoring: Secondary | ICD-10-CM

## 2013-07-15 LAB — POCT INR: INR: 2.6

## 2013-07-16 ENCOUNTER — Telehealth (INDEPENDENT_AMBULATORY_CARE_PROVIDER_SITE_OTHER): Payer: Self-pay | Admitting: General Surgery

## 2013-07-16 ENCOUNTER — Telehealth (INDEPENDENT_AMBULATORY_CARE_PROVIDER_SITE_OTHER): Payer: Self-pay

## 2013-07-16 NOTE — Telephone Encounter (Signed)
I spoke with him regarding his MR report. We discussed the results. I am scheduling an appointment to see him next week.

## 2013-07-16 NOTE — Telephone Encounter (Signed)
LMOM for pt to come to office to see DrThompson 07-21-13 arrive at 12:45pm.

## 2013-07-16 NOTE — Telephone Encounter (Signed)
Mri report in epic. Will forward msg to Dr [name] to review and advise any follow up.

## 2013-07-21 ENCOUNTER — Encounter (INDEPENDENT_AMBULATORY_CARE_PROVIDER_SITE_OTHER): Payer: Self-pay | Admitting: General Surgery

## 2013-07-21 ENCOUNTER — Ambulatory Visit (INDEPENDENT_AMBULATORY_CARE_PROVIDER_SITE_OTHER): Payer: Medicare Other | Admitting: General Surgery

## 2013-07-21 VITALS — BP 102/68 | HR 98 | Temp 97.6°F | Resp 20 | Ht 70.0 in | Wt 194.0 lb

## 2013-07-21 DIAGNOSIS — L02219 Cutaneous abscess of trunk, unspecified: Secondary | ICD-10-CM | POA: Diagnosis not present

## 2013-07-21 DIAGNOSIS — L03319 Cellulitis of trunk, unspecified: Secondary | ICD-10-CM

## 2013-07-21 NOTE — Progress Notes (Signed)
Subjective:     Patient ID: Christian Maxwell, male   DOB: 02-06-1933, 80 y.o.   MRN: [id number]  HPI Patient is for followup of suprapubic wound. He underwent MR which showed chronic abscess in the suprapubic area at the bases penis, no evidence of the pubic symphysis osteomyelitis, but there was an abnormality in his left acetabulum. He has no symptoms involving his left hip.  Review of Systems     Objective:   Physical Exam  Constitutional: He appears well-developed and well-nourished. No distress.  Neck: Normal range of motion.  Cardiovascular:  Irregularly irregular  Pulmonary/Chest: Effort normal and breath sounds normal.  Abdominal: Soft. He exhibits no distension. There is no tenderness. There is no rebound.  Healed old midline scar, scarring at the base of his penis with 2 small punctate sinuses. Minimal purulent fluid is expressible, no fluctuance, no cellulitis, the area was prepped with chlorhexidine and then the fluid expressed was sent for culture  Musculoskeletal:  Left arm healed status post incision and drainage       Assessment:     Chronic wound/abscess suprapubic region at the base of his penis, left acetabular abnormality on MR    Plan:     Culture sent from suprapubic wound as above. I feel we will need to excise this area in the operating room and allowed to heal by secondary intention. He is very reluctant to undergo any further surgical procedure. We will speak again once the cultures are back. In light of his MRI with the left acetabular abnormality, this may represent osteomyelitis versus arthritis versus something else. I sent a message to Dr. Zollie Beckers, his orthopedic surgeon, for further evaluation. I will call him once the results are back.

## 2013-07-23 DIAGNOSIS — Z6827 Body mass index (BMI) 27.0-27.9, adult: Secondary | ICD-10-CM | POA: Diagnosis not present

## 2013-07-23 DIAGNOSIS — I4891 Unspecified atrial fibrillation: Secondary | ICD-10-CM | POA: Diagnosis not present

## 2013-07-23 DIAGNOSIS — M542 Cervicalgia: Secondary | ICD-10-CM | POA: Diagnosis not present

## 2013-07-23 DIAGNOSIS — E0500 Thyrotoxicosis with diffuse goiter without thyrotoxic crisis or storm: Secondary | ICD-10-CM | POA: Diagnosis not present

## 2013-07-24 ENCOUNTER — Encounter: Payer: Self-pay | Admitting: Podiatry

## 2013-07-24 ENCOUNTER — Ambulatory Visit (INDEPENDENT_AMBULATORY_CARE_PROVIDER_SITE_OTHER): Payer: Medicare Other | Admitting: Podiatry

## 2013-07-24 VITALS — BP 118/62 | HR 62 | Resp 16

## 2013-07-24 DIAGNOSIS — L84 Corns and callosities: Secondary | ICD-10-CM | POA: Diagnosis not present

## 2013-07-24 DIAGNOSIS — M2040 Other hammer toe(s) (acquired), unspecified foot: Secondary | ICD-10-CM | POA: Diagnosis not present

## 2013-07-24 LAB — WOUND CULTURE
GRAM STAIN: NONE SEEN
Gram Stain: NONE SEEN
Gram Stain: NONE SEEN
Organism ID, Bacteria: NO GROWTH

## 2013-07-24 NOTE — Progress Notes (Signed)
   Subjective:    Patient ID: Christian Maxwell, male    DOB: Jan 25, 1933, 80 y.o.   MRN: [id number]  HPI Comments: "I just need him to clip a little spot off my foot"  Patient c/o callused areas plantar forefoot right for 2 weeks. States that it gets tender sometimes with walking and would like it trimmed down. No treatments at home.     Review of Systems  All other systems reviewed and are negative.       Objective:   Physical Exam        Assessment & Plan:

## 2013-07-26 NOTE — Progress Notes (Signed)
Subjective:     Patient ID: Christian Maxwell, male   DOB: 1932/07/16, 80 y.o.   MRN: [id number]  HPI patient states that I have pain in the plantar of my right foot and my fourth toe right. I get these periodically and they have become more symptomatic recently and I cannot take care of it myself   Review of Systems  All other systems reviewed and are negative.       Objective:   Physical Exam  Nursing note and vitals reviewed. Constitutional: He is oriented to person, place, and time.  Cardiovascular: Intact distal pulses.   Musculoskeletal: Normal range of motion.  Neurological: He is oriented to person, place, and time.  Skin: Skin is dry.   neurovascular status found to be intact with diminishment of range of motion midtarsal joint and subtalar joint with equinus condition noted. Rotated fourth toe right with distal lateral keratotic lesion and keratotic lesion underneath the second metatarsal right that is painful when pressed     Assessment:     Structural condition with digital deformity right fourth toe and keratotic plantar lesion right    Plan:     Debridement of lesions at this time and did initial H&P review wean findings with patient. If fourth toe remains symptomatic we'll need to consider derotational arthroplasty in the future

## 2013-07-30 ENCOUNTER — Telehealth (INDEPENDENT_AMBULATORY_CARE_PROVIDER_SITE_OTHER): Payer: Self-pay

## 2013-07-30 NOTE — Telephone Encounter (Signed)
No referral was requested by Dr [name]. He sent msg to Dr Kathrynn Speed to review MRI and advise any follow up Per the note in epic. I have notified pt of this and have given Dr [name] request to call Dr Ninfa Linden to discuss MRI.

## 2013-07-30 NOTE — Telephone Encounter (Signed)
Patient's wife calling to check the status of Ortho appt w/Dr. Ninfa Linden.  Waiting to hear from Dr. Trevor Mace office from MRI results.  Please call to make aware if appointment or referral has been made.

## 2013-07-31 ENCOUNTER — Telehealth (INDEPENDENT_AMBULATORY_CARE_PROVIDER_SITE_OTHER): Payer: Self-pay | Admitting: General Surgery

## 2013-07-31 NOTE — Telephone Encounter (Signed)
Left MOM re: cultures and orthopedic question F/U

## 2013-08-05 DIAGNOSIS — M76899 Other specified enthesopathies of unspecified lower limb, excluding foot: Secondary | ICD-10-CM | POA: Diagnosis not present

## 2013-08-06 DIAGNOSIS — E0500 Thyrotoxicosis with diffuse goiter without thyrotoxic crisis or storm: Secondary | ICD-10-CM | POA: Diagnosis not present

## 2013-08-06 DIAGNOSIS — R7401 Elevation of levels of liver transaminase levels: Secondary | ICD-10-CM | POA: Diagnosis not present

## 2013-08-06 DIAGNOSIS — R413 Other amnesia: Secondary | ICD-10-CM | POA: Diagnosis not present

## 2013-08-06 DIAGNOSIS — R7402 Elevation of levels of lactic acid dehydrogenase (LDH): Secondary | ICD-10-CM | POA: Diagnosis not present

## 2013-08-06 DIAGNOSIS — M542 Cervicalgia: Secondary | ICD-10-CM | POA: Diagnosis not present

## 2013-08-06 DIAGNOSIS — E785 Hyperlipidemia, unspecified: Secondary | ICD-10-CM | POA: Diagnosis not present

## 2013-08-06 DIAGNOSIS — C189 Malignant neoplasm of colon, unspecified: Secondary | ICD-10-CM | POA: Diagnosis not present

## 2013-08-06 DIAGNOSIS — I2510 Atherosclerotic heart disease of native coronary artery without angina pectoris: Secondary | ICD-10-CM | POA: Diagnosis not present

## 2013-08-06 DIAGNOSIS — I4891 Unspecified atrial fibrillation: Secondary | ICD-10-CM | POA: Diagnosis not present

## 2013-08-08 ENCOUNTER — Telehealth: Payer: Self-pay | Admitting: Cardiovascular Disease

## 2013-08-08 DIAGNOSIS — R079 Chest pain, unspecified: Secondary | ICD-10-CM

## 2013-08-08 MED ORDER — NITROGLYCERIN 0.4 MG SL SUBL: 0.4000 mg | SUBLINGUAL_TABLET | SUBLINGUAL | Status: DC | PRN

## 2013-08-08 NOTE — Telephone Encounter (Signed)
Called in stating he has had chest pain off and on during the night.  States he took NTG at 1:30 this morning and again at 6:30.  States he rates his CP on a scale of 8 at 1:30 and 5 at 6:30.  No SOB; heart rate doesn't feel irreg. Has not taken BP.  States pain in (L) side of chest. Does not radiate to arm or neck.  States he feels better now.  Wants to be seen by Dr.Nahser.  Advised that he needs to go to ER.  He does state his wife is with him.  Advised Dr. Acie Fredrickson is here today and I will speak with him but still advised to go to ER.  He refuses to go to ER.  Spoke w/Dr. Acie Fredrickson who suggested he go to ER. Spoke w/pt again and tried to clarify medications he took.  NTG is not listed on his medication.  States he didn't take NTG he took Metoprolol.  Explained to him he is only suppose to take Metoprolol twice a day. He seems to be unclear on his medications.  Advised will send Rx for NTG and to take that for his CP. Again advised that he needs to go to ER.  Again he refuses and says if pain comes back he will go to ER.

## 2013-08-08 NOTE — Telephone Encounter (Signed)
New Message:  Pt is c/o chest pain.. States he is having it now and it started last night. Pt denies any other symptoms..  Pt would like to speak to a nurse

## 2013-08-12 ENCOUNTER — Ambulatory Visit (INDEPENDENT_AMBULATORY_CARE_PROVIDER_SITE_OTHER): Payer: Medicare Other | Admitting: *Deleted

## 2013-08-12 DIAGNOSIS — I4891 Unspecified atrial fibrillation: Secondary | ICD-10-CM | POA: Diagnosis not present

## 2013-08-12 DIAGNOSIS — Z5181 Encounter for therapeutic drug level monitoring: Secondary | ICD-10-CM

## 2013-08-12 LAB — POCT INR: INR: 2.7

## 2013-08-13 ENCOUNTER — Ambulatory Visit (INDEPENDENT_AMBULATORY_CARE_PROVIDER_SITE_OTHER): Payer: Medicare Other | Admitting: General Surgery

## 2013-08-13 VITALS — BP 122/70 | HR 80 | Temp 97.9°F | Resp 16 | Ht 70.0 in | Wt 190.8 lb

## 2013-08-13 DIAGNOSIS — L02219 Cutaneous abscess of trunk, unspecified: Secondary | ICD-10-CM | POA: Diagnosis not present

## 2013-08-13 DIAGNOSIS — L03319 Cellulitis of trunk, unspecified: Secondary | ICD-10-CM

## 2013-08-13 NOTE — Patient Instructions (Signed)
Remove bandages at next shower

## 2013-08-13 NOTE — Progress Notes (Signed)
Subjective:     Patient ID: Christian Maxwell, male   DOB: 1932-05-23, 80 y.o.   MRN: [id number]  HPI Patient presents for followup of chronic drainage from suprapubic region. This was cultured a couple weeks ago and grew nothing. He underwent MR of the pelvis to rule out any osteomyelitis. Pubic region looked fine. There was an abnormality in his left acetabulum. He saw Dr. Zollie Beckers for that and reportedly it is not anything to worry about  Review of Systems     Objective:   Physical Exam  Constitutional: He appears well-developed and well-nourished. No distress.  Cardiovascular:  Irregularly irregular  Pulmonary/Chest: Effort normal and breath sounds normal.  Abdominal: Soft. He exhibits no distension. There is no tenderness. There is no rebound.   Left arm old I&D site has a tiny bud of granulation tissue which was treated with silver nitrate Suprapubic region appear similar with a tiny sinus. This could not be entered with a silver nitrate stick was locally treated. No erythema. Similar chronic scarring and minimal drainage.    Assessment:     Chronic sinus suprapubic region. Cultures negative.    Plan:     I again offered to re\re excise this area and leave it open to granulate in. He is concerned this may recur no matter what and is unwilling to go through surgery again at this time. He has agreed to see me back in a couple months. We will discuss things further at that time.

## 2013-08-20 ENCOUNTER — Other Ambulatory Visit: Payer: Self-pay | Admitting: *Deleted

## 2013-08-20 MED ORDER — WARFARIN SODIUM 5 MG PO TABS: ORAL_TABLET | ORAL | Status: DC

## 2013-08-22 ENCOUNTER — Ambulatory Visit (INDEPENDENT_AMBULATORY_CARE_PROVIDER_SITE_OTHER): Payer: Medicare Other | Admitting: Family Medicine

## 2013-08-22 ENCOUNTER — Encounter: Payer: Self-pay | Admitting: Family Medicine

## 2013-08-22 VITALS — BP 110/72 | HR 70 | Temp 98.0°F | Wt 192.0 lb

## 2013-08-22 DIAGNOSIS — R413 Other amnesia: Secondary | ICD-10-CM | POA: Diagnosis not present

## 2013-08-22 NOTE — Assessment & Plan Note (Addendum)
Overall stable MMSE today. Discussed continued vitamin supplementation (Bs, D, and fish oil) Discussed working on memory puzzles (word and math games and reading). Pt/family desires to continue aricept. A total of 25 minutes were spent face-to-face with the patient during this encounter and over half of that time was spent on counseling and coordination of care

## 2013-08-22 NOTE — Progress Notes (Signed)
BP 110/72  Pulse 70  Temp(Src) 98 F (36.7 C) (Oral)  Wt 192 lb (87.091 kg)  SpO2 99%   CC: discuss memory   Subjective:    Patient ID: Christian Maxwell, male    DOB: [birth year], 80 y.o.   MRN: [id number]  HPI: Christian Maxwell is a 80 y.o. male presenting on 08/22/2013 for Follow-up and Memory Issues   Last visit 02/2013 , never followed up for medicare wellness visit or formal memory assessment as previously planned.  ?memory trouble - on aricept 10mg  per endo [name].  Pt aware he has upcoming 59th wedding anniversary 4/22.  Wife worried about forgetting things.  Father and brother and sister had dementia  Denies trouble with falls or imbalance or dizziness or any tremors.  No getting lost while driving. Denies sadness, anhedonia, depression. Likes to walk twice daily in den 45 minutes.  Geriatric Assessment:  Activities of Daily Living:     Bathing- independent    Dressing- independent    Eating- independent    Toileting- independent    Transferring- independent    Continence- independent Overall Assessment: independent  Instrumental Activities of Daily Living:     Transportation- independent     Meal/Food Preparation-independent    Shopping Errands-independent    Housekeeping/Chores-independent    Money Management/Finances-independent    Medication Management-independent    Ability to Use Telephone-independent    Laundry- independent Overall Assessment: independent   Mental Status Exam: 29/30 (value/max value)     Clock Drawing Score: 3/4      Relevant past medical, surgical, family and social history reviewed and updated as indicated.  Allergies and medications reviewed and updated. Current Outpatient Prescriptions on File Prior to Visit  Medication Sig  . aspirin 81 MG tablet Take 162 mg by mouth daily.   [name] CALCIUM PO Take 1 tablet by mouth daily.   [name] donepezil (ARICEPT) 10 MG tablet Take 10 mg by mouth at bedtime.  [name] doxycycline (VIBRAMYCIN) 50  MG capsule Take 1 capsule (50 mg total) by mouth 2 (two) times daily.  . fish oil-omega-3 fatty acids 1000 MG capsule Take 1 g by mouth daily.   . folic acid (FOLVITE) 1 MG tablet Take 1 mg by mouth daily.  . metoprolol tartrate (LOPRESSOR) 25 MG tablet Take 1 tablet (25 mg total) by mouth 2 (two) times daily.  . nitroGLYCERIN (NITROSTAT) 0.4 MG SL tablet Place 1 tablet (0.4 mg total) under the tongue every 5 (five) minutes as needed for chest pain.  [name] oxyCODONE-acetaminophen (ROXICET) 5-325 MG per tablet Take 1-2 tablets by mouth every 4 (four) hours as needed for severe pain.  . simvastatin (ZOCOR) 40 MG tablet Take 1 tablet (40 mg total) by mouth every evening.  . Thiamine HCl (VITAMIN B-1 PO) Take 1 tablet by mouth daily.   [name] warfarin (COUMADIN) 5 MG tablet TAKE AS DIRECTED BY COUMADIN CLINIC  . zolpidem (AMBIEN) 10 MG tablet Take 1 tablet (10 mg total) by mouth at bedtime as needed for sleep.   No current facility-administered medications on file prior to visit.    Review of Systems Per HPI unless specifically indicated above    Objective:    BP 110/72  Pulse 70  Temp(Src) 98 F (36.7 C) (Oral)  Wt 192 lb (87.091 kg)  SpO2 99%  Physical Exam  Nursing note and vitals reviewed. Constitutional: He is oriented to person, place, and time. He appears well-developed and well-nourished. No distress.  HENT:  Mouth/Throat: Oropharynx is clear and moist. No oropharyngeal exudate.  Cardiovascular: Normal rate, regular rhythm, normal heart sounds and intact distal pulses.   No murmur heard. Pulmonary/Chest: Effort normal and breath sounds normal. No respiratory distress. He has no wheezes. He has no rales.  Musculoskeletal: He exhibits no edema.  Neurological: He is alert and oriented to person, place, and time.  Skin: Skin is warm and dry. No rash noted.   Results for orders placed in visit on 08/12/13  POCT INR      Result Value Ref Range   INR 2.7        Assessment & Plan:    Problem List Items Addressed This Visit   Memory impairment - Primary     Overall stable MMSE today. Discussed continued vitamin supplementation (Bs, D, and fish oil) Discussed working on memory puzzles (word and math games and reading). Pt/family desires to continue aricept. A total of 25 minutes were spent face-to-face with the patient during this encounter and over half of that time was spent on counseling and coordination of care        Follow up plan: Return in about 6 months (around 02/21/2014), or needed, for annual exam, prior fasting for blood work.

## 2013-08-22 NOTE — Patient Instructions (Addendum)
Schedule medicare wellness visit for next visit - specify this again up front. Work on [employer] and word puzzles and reading to keep memory active. Good to see you today, call us with questions.

## 2013-08-22 NOTE — Progress Notes (Signed)
Pre visit review using our clinic review tool, if applicable. No additional management support is needed unless otherwise documented below in the visit note. 

## 2013-08-25 ENCOUNTER — Ambulatory Visit: Payer: Medicare Other | Admitting: Family Medicine

## 2013-08-27 ENCOUNTER — Ambulatory Visit (INDEPENDENT_AMBULATORY_CARE_PROVIDER_SITE_OTHER): Payer: Medicare Other | Admitting: Podiatry

## 2013-08-27 ENCOUNTER — Encounter: Payer: Self-pay | Admitting: Podiatry

## 2013-08-27 VITALS — BP 102/59 | HR 78 | Resp 16

## 2013-08-27 DIAGNOSIS — L84 Corns and callosities: Secondary | ICD-10-CM

## 2013-08-27 NOTE — Progress Notes (Signed)
Subjective:     Patient ID: Christian Maxwell, male   DOB: Sep 20, 1932, 80 y.o.   MRN: [id number]  HPI presents with painful callus corn formation plantar aspect of the right foot   Review of Systems     Objective:   Physical Exam Neurovascular status intact with loosened keratotic lesion it's painful plantar right    Assessment:     Callus formation plantar aspect right foot    Plan:     Debridement painful lesion plantar right

## 2013-09-01 ENCOUNTER — Telehealth: Payer: Self-pay | Admitting: Cardiovascular Disease

## 2013-09-01 NOTE — Telephone Encounter (Signed)
New problem   Pt having chest pain when taking deep breath.

## 2013-09-01 NOTE — Telephone Encounter (Signed)
Called pt back with Dr. Elmarie Shiley recommendations. Dr Acie Fredrickson instructs the patient to take 1 motrin every 6 hours as needed for pleuritic chest pain. Asked pt if he had Motrin on hand and he said "yes." Instructed Pt on correct use to take Motrin. Advised pt that to take all medications as directed and to take them with him on his trip. Also advised pt to take Nitro as directed on the bottle if chest pain occurs. Advised pt if he becomes SOB, diaphoretic, has chest pain, or is in any acute distress, then he needs to go to the nearest ER. Pt verbalized understanding of all instructions given.

## 2013-09-01 NOTE — Telephone Encounter (Signed)
Pt called this morning c/o of having mild chest pain when he takes a deep breath. Pt states that this started at 6 am this morning when he woke up to fix his breakfast, he took a deep breath in and chest pain started in the middle of his chest. Pt states he does not have a cough. Pt states he is not SOB. Pt states he takes his medications as prescribed. Pt stated he did take 1 nitroglycerin after he felt the pain and he felt relief. Pt stated he also took his Metoprolol as prescribed this morning at breakfast. Asked pt if he took his BP and his HR. Pt states "no I didn't." Pt states he's not diaphoretic. Pt states pain does not radiate down arm or into neck. Pt stated that activity does not make it worst or better. Pt states "it just hurts when I take a deep breath."  Pt called into to ask if he could see his Cardiologist this a.m. (Dr. Acie Fredrickson), because he is going out of town to [place]. Told pt I would go and talk to Dr. Acie Fredrickson and would call him back in a few minutes.

## 2013-09-03 ENCOUNTER — Ambulatory Visit: Payer: Medicare Other | Admitting: Family Medicine

## 2013-09-04 ENCOUNTER — Ambulatory Visit: Payer: Medicare Other | Admitting: Cardiovascular Disease

## 2013-09-24 ENCOUNTER — Ambulatory Visit (INDEPENDENT_AMBULATORY_CARE_PROVIDER_SITE_OTHER): Payer: Medicare Other | Admitting: General Surgery

## 2013-09-24 ENCOUNTER — Encounter (INDEPENDENT_AMBULATORY_CARE_PROVIDER_SITE_OTHER): Payer: Self-pay | Admitting: General Surgery

## 2013-09-24 VITALS — BP 108/70 | HR 80 | Temp 97.5°F | Resp 16 | Ht 70.0 in | Wt 191.4 lb

## 2013-09-24 DIAGNOSIS — IMO0002 Reserved for concepts with insufficient information to code with codable children: Secondary | ICD-10-CM

## 2013-09-24 DIAGNOSIS — L02219 Cutaneous abscess of trunk, unspecified: Secondary | ICD-10-CM | POA: Diagnosis not present

## 2013-09-24 DIAGNOSIS — L03319 Cellulitis of trunk, unspecified: Secondary | ICD-10-CM

## 2013-09-24 NOTE — Progress Notes (Signed)
Subjective:     Patient ID: KIRKLAND FIGG, male   DOB: October 29, 1932, 80 y.o.   MRN: [id number]  HPI Patient presents for followup of consciousness and suprapubic abscess. His arm rarely drains some fluid but has overall been feeling much better. He continues to have suprapubic drainage intermittently. Sometimes it gets much better and then worsens. He has been cleaning it regularly.  Review of Systems     Objective:   Physical Exam  Constitutional: He appears well-developed and well-nourished.  Cardiovascular:  Irregularly irregular  Pulmonary/Chest: Effort normal and breath sounds normal.  Abdominal: Soft.   Left arm wound healed central dimple and no fluctuance or drainage, no cellulitis, suprapubic region his ring of erythema consistent with fungal dermatitis, 2 small sinuses remain open right at the base of his penis with clear fluid drainage, no fluctuants, no abscess, no palpable drainable collection    Assessment:     Left wound improved, suprapubic Wound with fungal dermatitis    Plan:     Lotrimin over-the-counter to suprapubic fungal dermatitis. Continue current care. I have again discussed excising this supra-pubic area to try and get this to heal well. We have done this in the past a couple of times and the patient does not want to go through surgery again. We have ruled out osteomyelitis. He also saw Dr. Zollie Beckers regarding his left hip abnormality seen on MRI. We will work to the fungal infection cleared up we'll see him back in 2 months. It is difficult to strongly recommend reexcision when we have had failures in the past.

## 2013-09-30 ENCOUNTER — Ambulatory Visit (INDEPENDENT_AMBULATORY_CARE_PROVIDER_SITE_OTHER): Payer: Medicare Other | Admitting: Pharmacist

## 2013-09-30 ENCOUNTER — Ambulatory Visit (INDEPENDENT_AMBULATORY_CARE_PROVIDER_SITE_OTHER): Payer: Medicare Other | Admitting: Cardiovascular Disease

## 2013-09-30 ENCOUNTER — Encounter: Payer: Self-pay | Admitting: Cardiovascular Disease

## 2013-09-30 VITALS — BP 112/74 | HR 64 | Ht 70.0 in | Wt 190.0 lb

## 2013-09-30 DIAGNOSIS — I4891 Unspecified atrial fibrillation: Secondary | ICD-10-CM

## 2013-09-30 DIAGNOSIS — Z5181 Encounter for therapeutic drug level monitoring: Secondary | ICD-10-CM

## 2013-09-30 DIAGNOSIS — E785 Hyperlipidemia, unspecified: Secondary | ICD-10-CM | POA: Diagnosis not present

## 2013-09-30 DIAGNOSIS — R0789 Other chest pain: Secondary | ICD-10-CM | POA: Diagnosis not present

## 2013-09-30 LAB — POCT INR: INR: 3.4

## 2013-09-30 NOTE — Assessment & Plan Note (Signed)
He initially mentioned some chest discomfort but then stated that it was not all that severe.  His wife tried to convence him to take the stress test but he did not want to.  I would have a low threshold to order a Lexiscan myoview if he calls back with some chest discomfort or if he takes more NTG than usual.

## 2013-09-30 NOTE — Patient Instructions (Signed)
Your physician recommends that you continue on your current medications as directed. Please refer to the Current Medication list given to you today.  Your physician wants you to follow-up in: 6 months with Dr. Nahser.  You will receive a reminder letter in the mail two months in advance. If you don't receive a letter, please call our office to schedule the follow-up appointment.  

## 2013-09-30 NOTE — Progress Notes (Signed)
Christian Maxwell Date of Birth  19-Jan-1933 [place] [address]    [name], [name]  [address]    [name], [name]  [address] [phone]  Fax  [phone]  [phone]  Fax [phone]   History of Present Illness:  Problem list: 1. Atrial fibrillation 2. Hyperlipidemia 3. Premature ventricular contractions 4. Hypothyroidism    Christian Maxwell is a 80 year old gentleman with a history of atrial fibrillation, hyperlipidemia, and a history of PVCs. He exercises on a regular basis. He has not had any episodes of chest pain or shortness of breath. He denies any syncope or presyncope.  He's doing much better. He was having some shortness of breath earlier in the year and the end of last year.  Dec. 4, 2013 :   He presents today with some right sided chest wall pain.   The discomfort increases with deep breath, right arm movement and with twisting of his torso.  No dyspnea.  No angina.    May 22, 2011: He's doing very well at this point. He is no longer having any episodes of chest wall pain.  He still rides a stationary bike for 30 minutes twice a day and does not have any episodes of pain.  Feb. 5, 2015:  Christian Maxwell has no complaints.   He was seen with his wife.  It is clear that he is gradually getting more and more demented.   His wife states that he breathes heavily when climbing stairs. He's able to do all of his normal activities without any significant difficulties.  He does notice that he'll typically develop some chest pain that she's late taking his second metoprolol dose of the day.    Sep 30, 2013:  Christian Maxwell is doing well.   Stays busy.    Current Outpatient Prescriptions on File Prior to Visit  Medication Sig Dispense Refill  . aspirin 81 MG tablet Take 162 mg by mouth daily.       [name] CALCIUM PO Take 1 tablet by mouth daily.       [name] donepezil (ARICEPT) 10 MG tablet Take 10 mg by mouth at bedtime.      [name]  doxycycline (VIBRAMYCIN) 50 MG capsule Take 1 capsule (50 mg total) by mouth 2 (two) times daily.  28 capsule  0  . fish oil-omega-3 fatty acids 1000 MG capsule Take 1 g by mouth daily.       . folic acid (FOLVITE) 1 MG tablet Take 1 mg by mouth daily.      . metoprolol tartrate (LOPRESSOR) 25 MG tablet Take 1 tablet (25 mg total) by mouth 2 (two) times daily.  60 tablet  11  . nitroGLYCERIN (NITROSTAT) 0.4 MG SL tablet Place 1 tablet (0.4 mg total) under the tongue every 5 (five) minutes as needed for chest pain.  90 tablet  3  . oxyCODONE-acetaminophen (ROXICET) 5-325 MG per tablet Take 1-2 tablets by mouth every 4 (four) hours as needed for severe pain.  60 tablet  0  . simvastatin (ZOCOR) 40 MG tablet Take 1 tablet (40 mg total) by mouth every evening.  90 tablet  2  . Thiamine HCl (VITAMIN B-1 PO) Take 1 tablet by mouth daily.       [name] warfarin (COUMADIN) 5 MG tablet TAKE AS DIRECTED BY COUMADIN CLINIC  40 tablet  3  . zolpidem (AMBIEN) 10 MG tablet Take 1 tablet (10  mg total) by mouth at bedtime as needed for sleep.  30 tablet  0   No current facility-administered medications on file prior to visit.    No Known Allergies  Past Medical History  Diagnosis Date  . Hyperlipidemia   . Syncope and collapse     pt denies  . Atrial fibrillation   . PVC's (premature ventricular contractions)   . Abscess 7/13    abd abscess  . History of colon cancer 2000    T3N0 s/p colectomy  . Normal nuclear stress test 2004  . METHICILLIN RESISTANT STAPHYLOCOCCUS AUREUS INFECTION 12/12/2006    Annotation: stitch abcess in lower abdomen after colon cancer resection Qualifier: Diagnosis of  By: [name] MD, [name]    . Postoperative stitch abscess 07/19/2011  . Hx of adenomatous colonic polyps   . Thyroid disease     followed by [name]  . Insomnia   . Skin abscess     recurrent  . Hypertension     pt states he does not have this. 02/26/13  . Myocardial infarction     at age 19 years  . Cancer      colon cancer 2000  . Arthritis     bursitis in left shoulder    Past Surgical History  Procedure Laterality Date  . Lumbar laminectomy  1977  . Wound exploration  2006    For possible stitch abscess Dr [name]  . Incise and drain abcess  2009,2010,2011,2012  . Colectomy  2000    6 inches removed  . Mass excision  03/07/2012    EXCISION MASS;  Surgeon: [name], MD;  Laterality: Right;  removal mass posterior right arm  . Polypectomy    . Colonoscopy  2012    per patient (Dr. [name])  . Incision and drainage abscess N/A 09/20/2012    Procedure: INCISION AND DRAINAGE SUPRAPUBIC ABSCESS;  Surgeon: [name], MD;  Location: [facility];  Service: General;  Laterality: N/A;  . Hernia repair  01/9832    DB umbilical hernia  . Eye surgery      cataract surgery both eyes  . Incision and drainage abscess Left 02/28/2013    Procedure: INCISION AND DRAINAGE LEFT ARM ABSCESS;  Surgeon: [name], MD;  Location: [facility];  Service: General;  Laterality: Left;  . Incision and drainage Right 04/02/2013    Procedure: INCISION AND DRAINAGE RIGHT KNEE HEMATOMA;  Surgeon: [name], MD;  Location: [facility];  Service: Orthopedics;  Laterality: Right;    History  Smoking status  . Former Smoker  . Quit date: 10/13/1977  Smokeless tobacco  . Former Systems developer  . Types: Chew  . Quit date: 10/13/1977    History  Alcohol Use No    Family History  Problem Relation Age of Onset  . Stroke Mother   . Stroke Father   . Hypertension Father   . CAD Father 32    MI  . Stroke Sister 75  . Stroke Brother   . Stroke Brother   . Diabetes Brother   . CAD Brother 40    MI  . Cancer Neg Hx   . Dementia Sister 72  . Dementia Father   . Dementia Brother     Reviw of Systems:  Reviewed in the HPI.  All other systems are negative.  Physical Exam: BP 112/74  Pulse 64  Ht 5\' 10"  (1.778 m)  Wt 190 lb (86.183 kg)  BMI 27.26 kg/m2 The  patient is alert and  oriented x 3.  The mood and affect are normal.   Skin: warm and dry.  Color is normal.   HEENT:   Normocephalic/atraumatic. No JVD. His carotids are normal. Lungs: Lungs are clear.  Heart: Irregularly irregular. He has no murmurs.   The right side of his chest has significant chest wall tenderness.  Compression of his rib cage on caused worsening of his rib pain. Abdomen: Is good bowel sounds. There is no hepatomegaly Extremities:  No clubbing cyanosis or edema.   Neuro:  Exam is nonfocal. His gait is normal.    ECG: Sep 30, 2013:  A-fib at 46.  NS ST abn.  Assessment / Plan:

## 2013-09-30 NOTE — Assessment & Plan Note (Signed)
His A-fib is stable.  Rate is well controlled.  Will check his INR today.

## 2013-10-21 ENCOUNTER — Ambulatory Visit (INDEPENDENT_AMBULATORY_CARE_PROVIDER_SITE_OTHER): Payer: Medicare Other | Admitting: *Deleted

## 2013-10-21 DIAGNOSIS — Z5181 Encounter for therapeutic drug level monitoring: Secondary | ICD-10-CM | POA: Diagnosis not present

## 2013-10-21 DIAGNOSIS — I4891 Unspecified atrial fibrillation: Secondary | ICD-10-CM

## 2013-10-21 LAB — POCT INR: INR: 3.4

## 2013-11-03 ENCOUNTER — Telehealth: Payer: Self-pay | Admitting: Cardiovascular Disease

## 2013-11-06 ENCOUNTER — Ambulatory Visit (INDEPENDENT_AMBULATORY_CARE_PROVIDER_SITE_OTHER): Payer: Medicare Other | Admitting: *Deleted

## 2013-11-06 DIAGNOSIS — I4891 Unspecified atrial fibrillation: Secondary | ICD-10-CM

## 2013-11-06 DIAGNOSIS — Z5181 Encounter for therapeutic drug level monitoring: Secondary | ICD-10-CM | POA: Diagnosis not present

## 2013-11-06 LAB — POCT INR: INR: 1.9

## 2013-11-07 ENCOUNTER — Ambulatory Visit (INDEPENDENT_AMBULATORY_CARE_PROVIDER_SITE_OTHER): Payer: Medicare Other | Admitting: General Surgery

## 2013-11-07 ENCOUNTER — Encounter (INDEPENDENT_AMBULATORY_CARE_PROVIDER_SITE_OTHER): Payer: Self-pay | Admitting: General Surgery

## 2013-11-07 VITALS — BP 108/68 | HR 72 | Resp 20 | Ht 70.0 in | Wt 191.2 lb

## 2013-11-07 DIAGNOSIS — IMO0002 Reserved for concepts with insufficient information to code with codable children: Secondary | ICD-10-CM | POA: Diagnosis not present

## 2013-11-07 MED ORDER — CHLORHEXIDINE GLUCONATE 4 % EX LIQD: Freq: Every day | CUTANEOUS | Status: DC | PRN

## 2013-11-07 MED ORDER — DOXYCYCLINE HYCLATE 50 MG PO CAPS: 100.0000 mg | ORAL_CAPSULE | Freq: Two times a day (BID) | ORAL | Status: DC

## 2013-11-07 NOTE — Progress Notes (Signed)
Subjective:     Patient ID: Christian Maxwell, male   DOB: 1932/12/20, 80 y.o.   MRN: [id number]  HPI  He comes in today because he is having some cloudy drainage and redness around his chronic left upper arm wound and his suprapubic wound. He is due to see Dr. [name] on July 8.   Review of SystemsHe takes chronic Coumadin.     Objective:   Physical Exam Gen.-elderly male in no acute distress.  Abdomen-small open wound with cloudy drainage and some surrounding redness in the suprapubic region.  Left arm-small open wound with cloudy drainage and some surrounding redness lateral upper ar both areas were cleansed a dry dressing was applied.    Assessment:     Chronic open wounds with some cellulitis and drainage left upper arm and suprapubic area.     Plan:     Clean and shower Hibiclens soap daily and apply a dry dressing. Doxycycline. Check INR next week. I explained that the doxycycline could potentiate the effect of the Coumadin. Return visit with Dr. [name] on July 8.

## 2013-11-07 NOTE — Patient Instructions (Signed)
Wash wounds in shower with Hibiclens soap daily then apply a dry dressing.  Have INR checked on Wednesday, July 1.

## 2013-11-13 ENCOUNTER — Ambulatory Visit (INDEPENDENT_AMBULATORY_CARE_PROVIDER_SITE_OTHER): Payer: Medicare Other | Admitting: Podiatry

## 2013-11-13 ENCOUNTER — Encounter: Payer: Self-pay | Admitting: Podiatry

## 2013-11-13 VITALS — BP 144/86 | HR 86 | Resp 12

## 2013-11-13 DIAGNOSIS — L84 Corns and callosities: Secondary | ICD-10-CM | POA: Diagnosis not present

## 2013-11-15 NOTE — Progress Notes (Signed)
Subjective:     Patient ID: Christian Maxwell, male   DOB: 08/13/1932, 80 y.o.   MRN: [id number]  HPI patient presents with several plantar lesions that are painful when pressed   Review of Systems     Objective:   Physical Exam Neurovascular status intact with no other health history changes    Assessment:     Plantar lesions noted bilateral    Plan:     Debris painful lesions bilateral with no iatrogenic bleeding noted

## 2013-11-19 ENCOUNTER — Encounter (INDEPENDENT_AMBULATORY_CARE_PROVIDER_SITE_OTHER): Payer: Medicare Other | Admitting: General Surgery

## 2013-12-02 ENCOUNTER — Ambulatory Visit (INDEPENDENT_AMBULATORY_CARE_PROVIDER_SITE_OTHER): Payer: Medicare Other | Admitting: *Deleted

## 2013-12-02 DIAGNOSIS — I4891 Unspecified atrial fibrillation: Secondary | ICD-10-CM | POA: Diagnosis not present

## 2013-12-02 DIAGNOSIS — Z5181 Encounter for therapeutic drug level monitoring: Secondary | ICD-10-CM

## 2013-12-02 LAB — POCT INR: INR: 3

## 2013-12-09 ENCOUNTER — Ambulatory Visit (INDEPENDENT_AMBULATORY_CARE_PROVIDER_SITE_OTHER): Payer: Medicare Other | Admitting: Surgery

## 2013-12-09 ENCOUNTER — Encounter (INDEPENDENT_AMBULATORY_CARE_PROVIDER_SITE_OTHER): Payer: Self-pay | Admitting: Surgery

## 2013-12-09 VITALS — BP 124/80 | HR 61 | Temp 97.0°F | Ht 70.0 in | Wt 191.0 lb

## 2013-12-09 DIAGNOSIS — L03319 Cellulitis of trunk, unspecified: Secondary | ICD-10-CM

## 2013-12-09 DIAGNOSIS — L02219 Cutaneous abscess of trunk, unspecified: Secondary | ICD-10-CM | POA: Diagnosis not present

## 2013-12-09 DIAGNOSIS — L03311 Cellulitis of abdominal wall: Secondary | ICD-10-CM

## 2013-12-09 MED ORDER — DOXYCYCLINE HYCLATE 100 MG PO TABS: 100.0000 mg | ORAL_TABLET | Freq: Two times a day (BID) | ORAL | Status: DC

## 2013-12-09 NOTE — Progress Notes (Signed)
Subjective:     Patient ID: Christian Maxwell, male   DOB: 09/05/1932, 80 y.o.   MRN: [id number]  HPI He is here for evaluation of increasing erythema and tenderness on his lower abdominal wall in the suprapubic area. He has been off of antibiotics for over a week.  Review of Systems     Objective:   Physical Exam On exam, there is cellulitis and tenderness of the right lower abdominal wall at the base of the penis. I anesthetized the area of lidocaine inserted an 18-gauge needle and could not find any purulence.    Assessment:     Abdominal wall cellulitis      Plan:     I will start him on doxycycline. He will keep his appointment with Dr. [name] next week

## 2013-12-10 ENCOUNTER — Telehealth (INDEPENDENT_AMBULATORY_CARE_PROVIDER_SITE_OTHER): Payer: Self-pay

## 2013-12-10 NOTE — Telephone Encounter (Signed)
Pt was seen yesterday by Dr. Ninfa Linden for abdominal wall cellulitis.  He was given a Rx for doxycycline and will see Dr. [name] on 12/19/13 for follow up.  He is calling this morning complaining of discomfort.  I explained he will need to give the antibiotic a chance to work.  He does not want to wait until next week to see Dr. [name].  I apologized and told him if his symptoms worsened over the next 24 hours to call us back, but to keep his appt with Dr. [name].  Pt understood and agreed.

## 2013-12-16 ENCOUNTER — Ambulatory Visit (INDEPENDENT_AMBULATORY_CARE_PROVIDER_SITE_OTHER): Payer: Medicare Other | Admitting: *Deleted

## 2013-12-16 DIAGNOSIS — Z5181 Encounter for therapeutic drug level monitoring: Secondary | ICD-10-CM | POA: Diagnosis not present

## 2013-12-16 DIAGNOSIS — I4891 Unspecified atrial fibrillation: Secondary | ICD-10-CM

## 2013-12-16 LAB — POCT INR: INR: 1.8

## 2013-12-19 ENCOUNTER — Encounter (INDEPENDENT_AMBULATORY_CARE_PROVIDER_SITE_OTHER): Payer: Self-pay | Admitting: General Surgery

## 2013-12-19 ENCOUNTER — Ambulatory Visit (INDEPENDENT_AMBULATORY_CARE_PROVIDER_SITE_OTHER): Payer: Medicare Other | Admitting: General Surgery

## 2013-12-19 VITALS — BP 122/80 | HR 76 | Temp 97.4°F | Ht 70.0 in | Wt 191.0 lb

## 2013-12-19 DIAGNOSIS — L03319 Cellulitis of trunk, unspecified: Secondary | ICD-10-CM

## 2013-12-19 DIAGNOSIS — L02219 Cutaneous abscess of trunk, unspecified: Secondary | ICD-10-CM | POA: Diagnosis not present

## 2013-12-19 MED ORDER — DOXYCYCLINE HYCLATE 100 MG PO TABS: 100.0000 mg | ORAL_TABLET | Freq: Two times a day (BID) | ORAL | Status: DC

## 2013-12-19 MED ORDER — OXYCODONE-ACETAMINOPHEN 5-325 MG PO TABS: 1.0000 | ORAL_TABLET | ORAL | Status: DC | PRN

## 2013-12-19 NOTE — Progress Notes (Signed)
Subjective:     Patient ID: Christian Maxwell, male   DOB: 10/26/1932, 80 y.o.   MRN: [id number]  HPI Patient presents for F/U of recurrent suprapubic abscess. He is on Doxy and claims it is better.  Review of Systems     Objective:   Physical Exam Abdomen soft, NT, suprapubic area with small opening, no significant drainage, sweling on each side without fluctuance or cellulitis  L arm site healed    Assessment:     Suprapubic abscess improving, arm healed    Plan:     Refill doxy and pain meds, return next month. He does not want any further surgery on the area. He asked about shoulder bursitis and I recommended he see Zollie Beckers, who he has seen for his knee.

## 2013-12-22 DIAGNOSIS — M67919 Unspecified disorder of synovium and tendon, unspecified shoulder: Secondary | ICD-10-CM | POA: Diagnosis not present

## 2013-12-22 DIAGNOSIS — M719 Bursopathy, unspecified: Secondary | ICD-10-CM | POA: Diagnosis not present

## 2013-12-30 ENCOUNTER — Ambulatory Visit (INDEPENDENT_AMBULATORY_CARE_PROVIDER_SITE_OTHER): Payer: Medicare Other | Admitting: Pharmacist Clinician (PhC)/ Clinical Pharmacy Specialist

## 2013-12-30 DIAGNOSIS — I4891 Unspecified atrial fibrillation: Secondary | ICD-10-CM

## 2013-12-30 DIAGNOSIS — Z5181 Encounter for therapeutic drug level monitoring: Secondary | ICD-10-CM

## 2013-12-30 LAB — POCT INR: INR: 2.8

## 2014-01-08 ENCOUNTER — Other Ambulatory Visit: Payer: Self-pay

## 2014-01-08 MED ORDER — SIMVASTATIN 40 MG PO TABS: 40.0000 mg | ORAL_TABLET | Freq: Every evening | ORAL | Status: DC

## 2014-01-14 ENCOUNTER — Ambulatory Visit (INDEPENDENT_AMBULATORY_CARE_PROVIDER_SITE_OTHER): Payer: Medicare Other | Admitting: General Surgery

## 2014-01-14 DIAGNOSIS — L0390 Cellulitis, unspecified: Secondary | ICD-10-CM | POA: Diagnosis not present

## 2014-01-14 DIAGNOSIS — L0291 Cutaneous abscess, unspecified: Secondary | ICD-10-CM | POA: Diagnosis not present

## 2014-01-15 NOTE — Telephone Encounter (Signed)
error 

## 2014-01-21 DIAGNOSIS — S51009A Unspecified open wound of unspecified elbow, initial encounter: Secondary | ICD-10-CM | POA: Diagnosis not present

## 2014-01-23 DIAGNOSIS — S51009A Unspecified open wound of unspecified elbow, initial encounter: Secondary | ICD-10-CM | POA: Diagnosis not present

## 2014-02-03 ENCOUNTER — Ambulatory Visit (INDEPENDENT_AMBULATORY_CARE_PROVIDER_SITE_OTHER): Payer: Medicare Other | Admitting: *Deleted

## 2014-02-03 DIAGNOSIS — Z5181 Encounter for therapeutic drug level monitoring: Secondary | ICD-10-CM

## 2014-02-03 DIAGNOSIS — I4891 Unspecified atrial fibrillation: Secondary | ICD-10-CM | POA: Diagnosis not present

## 2014-02-03 LAB — POCT INR: INR: 2.4

## 2014-02-04 ENCOUNTER — Telehealth: Payer: Self-pay | Admitting: Cardiovascular Disease

## 2014-02-04 NOTE — Telephone Encounter (Signed)
Christian Maxwell called to see if patient's appointment with Nahser could be moved up. She st that Christian Maxwell is having mild to moderate SOB on exertion (walking, and going up and down stairs). She st that SOB has been a problem since last office visit in May. Marie st Christian Maxwell is taking his medications as directed, has no weight gain or swelling. November 17 appointment moved to October 5th at 1100. Christian Maxwell wants Finnean to see Dr. Acie Fredrickson only.  Instructed Christian Maxwell to call back if SOB worsens or any new symptoms occur before next appointment.

## 2014-02-04 NOTE — Telephone Encounter (Signed)
New problem ° ° °Pt is having SOB. °

## 2014-02-06 DIAGNOSIS — E785 Hyperlipidemia, unspecified: Secondary | ICD-10-CM | POA: Diagnosis not present

## 2014-02-06 DIAGNOSIS — L0390 Cellulitis, unspecified: Secondary | ICD-10-CM | POA: Diagnosis not present

## 2014-02-06 DIAGNOSIS — I4891 Unspecified atrial fibrillation: Secondary | ICD-10-CM | POA: Diagnosis not present

## 2014-02-06 DIAGNOSIS — Z79899 Other long term (current) drug therapy: Secondary | ICD-10-CM | POA: Diagnosis not present

## 2014-02-06 DIAGNOSIS — I2510 Atherosclerotic heart disease of native coronary artery without angina pectoris: Secondary | ICD-10-CM | POA: Diagnosis not present

## 2014-02-06 DIAGNOSIS — R7401 Elevation of levels of liver transaminase levels: Secondary | ICD-10-CM | POA: Diagnosis not present

## 2014-02-06 DIAGNOSIS — R972 Elevated prostate specific antigen [PSA]: Secondary | ICD-10-CM | POA: Diagnosis not present

## 2014-02-06 DIAGNOSIS — E0500 Thyrotoxicosis with diffuse goiter without thyrotoxic crisis or storm: Secondary | ICD-10-CM | POA: Diagnosis not present

## 2014-02-06 DIAGNOSIS — L0291 Cutaneous abscess, unspecified: Secondary | ICD-10-CM | POA: Diagnosis not present

## 2014-02-11 ENCOUNTER — Telehealth: Payer: Self-pay

## 2014-02-11 MED ORDER — WARFARIN SODIUM 5 MG PO TABS: ORAL_TABLET | ORAL | Status: DC

## 2014-02-11 NOTE — Telephone Encounter (Signed)
Rx refill for Warfarin sent to pharmacy as requested.

## 2014-02-16 ENCOUNTER — Ambulatory Visit: Payer: Medicare Other | Admitting: Cardiovascular Disease

## 2014-02-16 ENCOUNTER — Encounter: Payer: Self-pay | Admitting: Cardiovascular Disease

## 2014-02-16 ENCOUNTER — Ambulatory Visit (INDEPENDENT_AMBULATORY_CARE_PROVIDER_SITE_OTHER): Payer: Medicare Other | Admitting: Cardiovascular Disease

## 2014-02-16 VITALS — BP 118/62 | HR 81 | Ht 70.0 in | Wt 188.0 lb

## 2014-02-16 DIAGNOSIS — I4891 Unspecified atrial fibrillation: Secondary | ICD-10-CM | POA: Diagnosis not present

## 2014-02-16 DIAGNOSIS — R413 Other amnesia: Secondary | ICD-10-CM

## 2014-02-16 DIAGNOSIS — R0609 Other forms of dyspnea: Secondary | ICD-10-CM

## 2014-02-16 NOTE — Patient Instructions (Signed)
Your physician recommends that you continue on your current medications as directed. Please refer to the Current Medication list given to you today.  Your physician wants you to follow-up in: 6 months with Dr. Nahser.  You will receive a reminder letter in the mail two months in advance. If you don't receive a letter, please call our office to schedule the follow-up appointment.  

## 2014-02-16 NOTE — Assessment & Plan Note (Signed)
His wife insists that he is having DOE He denies that he is having any problems. I explained that he sounds fine on exam but that I cannot tell anything further without more testing. I think that he is fairly stable.

## 2014-02-16 NOTE — Assessment & Plan Note (Signed)
He's a stable. His atrial fibrillation rate is well controlled.

## 2014-02-16 NOTE — Assessment & Plan Note (Signed)
He declines needing any testing.  His wife tried to convince him but he refuses.    I think he may have some degree of demential that is causing him to refuse any additional testing.

## 2014-02-16 NOTE — Progress Notes (Signed)
Christian Maxwell [name] Date of Birth  08-11-1932 [place] [address]    [address] [place], [place]  [address]    [place], [place]  [address] [phone]  Fax  [phone]  [phone]  Fax [phone]   History of Present Illness:  Problem list: 1. Atrial fibrillation 2. Hyperlipidemia 3. Premature ventricular contractions 4. Hypothyroidism    Christian Maxwell is a 80 year old gentleman with a history of atrial fibrillation, hyperlipidemia, and a history of PVCs. He exercises on a regular basis. He has not had any episodes of chest pain or shortness of breath. He denies any syncope or presyncope.  He's doing much better. He was having some shortness of breath earlier in the year and the end of last year.  Dec. 4, 2013 :   He presents today with some right sided chest wall pain.   The discomfort increases with deep breath, right arm movement and with twisting of his torso.  No dyspnea.  No angina.    May 22, 2011: He's doing very well at this point. He is no longer having any episodes of chest wall pain.  He still rides a stationary bike for 30 minutes twice a day and does not have any episodes of pain.  Feb. 5, 2015:  Christian Maxwell has no complaints.   He was seen with his wife.  It is clear that he is gradually getting more and more demented.   His wife states that he breathes heavily when climbing stairs. He's able to do all of his normal activities without any significant difficulties.  He does notice that he'll typically develop some chest pain that she's late taking his second metoprolol dose of the day.    Sep 30, 2013:  Christian Maxwell is doing well.   Stays busy.    Oct. 5, 2015:    Christian Maxwell continues to have DOE.  His wife says that he has lots of dyspnea - he denies it.   Current Outpatient Prescriptions on File Prior to Visit  Medication Sig Dispense Refill  . aspirin 81 MG tablet Take 162 mg by mouth daily.       [name] CALCIUM PO  Take 1 tablet by mouth daily.       . chlorhexidine (HIBICLENS) 4 % external liquid Apply topically daily as needed. Cleans both wounds with this soap in the shower daily.  120 mL  1  . donepezil (ARICEPT) 10 MG tablet Take 10 mg by mouth at bedtime.      [name] doxycycline (VIBRA-TABS) 100 MG tablet Take 1 tablet (100 mg total) by mouth 2 (two) times daily.  14 tablet  1  . fish oil-omega-3 fatty acids 1000 MG capsule Take 1 g by mouth daily.       . folic acid (FOLVITE) 1 MG tablet Take 1 mg by mouth daily.      . metoprolol tartrate (LOPRESSOR) 25 MG tablet Take 1 tablet (25 mg total) by mouth 2 (two) times daily.  60 tablet  11  . nitroGLYCERIN (NITROSTAT) 0.4 MG SL tablet Place 1 tablet (0.4 mg total) under the tongue every 5 (five) minutes as needed for chest pain.  90 tablet  3  . oxyCODONE-acetaminophen (ROXICET) 5-325 MG per tablet Take 1-2 tablets by mouth every 4 (four) hours as needed for severe pain.  60 tablet  0  . simvastatin (ZOCOR) 40 MG tablet Take 1 tablet (40 mg total) by  mouth every evening.  90 tablet  0  . Thiamine HCl (VITAMIN B-1 PO) Take 1 tablet by mouth daily.       [name] warfarin (COUMADIN) 5 MG tablet TAKE AS DIRECTED BY COUMADIN CLINIC  40 tablet  3   No current facility-administered medications on file prior to visit.    No Known Allergies  Past Medical History  Diagnosis Date  . Hyperlipidemia   . Syncope and collapse     pt denies  . Atrial fibrillation   . PVC's (premature ventricular contractions)   . Abscess 7/13    abd abscess  . History of colon cancer 2000    T3N0 s/p colectomy  . Normal nuclear stress test 2004  . METHICILLIN RESISTANT STAPHYLOCOCCUS AUREUS INFECTION 12/12/2006    Annotation: stitch abcess in lower abdomen after colon cancer resection Qualifier: Diagnosis of  By: [name] MD, [name]    . Postoperative stitch abscess 07/19/2011  . Hx of adenomatous colonic polyps   . Thyroid disease     followed by [name]  . Insomnia   . Skin  abscess     recurrent  . Hypertension     pt states he does not have this. 02/26/13  . Myocardial infarction     at age 73 years  . Cancer     colon cancer 2000  . Arthritis     bursitis in left shoulder    Past Surgical History  Procedure Laterality Date  . Lumbar laminectomy  1977  . Wound exploration  2006    For possible stitch abscess Dr [name]  . Incise and drain abcess  2009,2010,2011,2012  . Colectomy  2000    6 inches removed  . Mass excision  03/07/2012    EXCISION MASS;  Surgeon: [name], MD;  Laterality: Right;  removal mass posterior right arm  . Polypectomy    . Colonoscopy  2012    per patient (Dr. [name])  . Incision and drainage abscess N/A 09/20/2012    Procedure: INCISION AND DRAINAGE SUPRAPUBIC ABSCESS;  Surgeon: [name], MD;  Location: [facility];  Service: General;  Laterality: N/A;  . Hernia repair  09/91    DB umbilical hernia  . Eye surgery      cataract surgery both eyes  . Incision and drainage abscess Left 02/28/2013    Procedure: INCISION AND DRAINAGE LEFT ARM ABSCESS;  Surgeon: [name], MD;  Location: [facility];  Service: General;  Laterality: Left;  . Incision and drainage Right 04/02/2013    Procedure: INCISION AND DRAINAGE RIGHT KNEE HEMATOMA;  Surgeon: [name], MD;  Location: [facility];  Service: Orthopedics;  Laterality: Right;    History  Smoking status  . Former Smoker  . Quit date: 10/13/1977  Smokeless tobacco  . Former Systems developer  . Types: Chew  . Quit date: 10/13/1977    History  Alcohol Use No    Family History  Problem Relation Age of Onset  . Stroke Mother   . Stroke Father   . Hypertension Father   . CAD Father 31    MI  . Stroke Sister 65  . Stroke Brother   . Stroke Brother   . Diabetes Brother   . CAD Brother 72    MI  . Cancer Neg Hx   . Dementia Sister 72  . Dementia Father   . Dementia Brother     Reviw of Systems:  Reviewed in the HPI.  All  other  systems are negative.  Physical Exam: BP 118/62  Pulse 81  Ht 5\' 10"  (1.778 m)  Wt 188 lb (85.276 kg)  BMI 26.98 kg/m2 The patient is alert and oriented x 3.  The mood and affect are normal.   Skin: warm and dry.  Color is normal.   HEENT:   Normocephalic/atraumatic. No JVD. His carotids are normal. Lungs: Lungs are clear.  Heart: Irregularly irregular. He has no murmurs.   The right side of his chest has significant chest wall tenderness.  Compression of his rib cage on caused worsening of his rib pain. Abdomen: Is good bowel sounds. There is no hepatomegaly Extremities:  No clubbing cyanosis or edema.   Neuro:  Exam is nonfocal. His gait is normal.    ECG: February 16, 2014: Atrial fibrillation at a rate of 81. Has occasional premature ventricular contractions.  Assessment / Plan:

## 2014-02-24 ENCOUNTER — Ambulatory Visit (INDEPENDENT_AMBULATORY_CARE_PROVIDER_SITE_OTHER): Payer: Medicare Other | Admitting: *Deleted

## 2014-02-24 DIAGNOSIS — I4891 Unspecified atrial fibrillation: Secondary | ICD-10-CM | POA: Diagnosis not present

## 2014-02-24 DIAGNOSIS — Z5181 Encounter for therapeutic drug level monitoring: Secondary | ICD-10-CM

## 2014-02-24 LAB — POCT INR: INR: 2.6

## 2014-02-25 DIAGNOSIS — L02211 Cutaneous abscess of abdominal wall: Secondary | ICD-10-CM | POA: Diagnosis not present

## 2014-02-27 DIAGNOSIS — Z23 Encounter for immunization: Secondary | ICD-10-CM | POA: Diagnosis not present

## 2014-03-17 ENCOUNTER — Encounter: Payer: Self-pay | Admitting: Family Medicine

## 2014-03-17 ENCOUNTER — Ambulatory Visit (INDEPENDENT_AMBULATORY_CARE_PROVIDER_SITE_OTHER): Payer: Medicare Other | Admitting: Family Medicine

## 2014-03-17 VITALS — BP 118/82 | HR 80 | Temp 97.2°F | Wt 184.0 lb

## 2014-03-17 DIAGNOSIS — M25562 Pain in left knee: Secondary | ICD-10-CM | POA: Diagnosis not present

## 2014-03-17 DIAGNOSIS — M25561 Pain in right knee: Secondary | ICD-10-CM | POA: Diagnosis not present

## 2014-03-17 NOTE — Progress Notes (Signed)
BP 118/82 mmHg  Pulse 80  Temp(Src) 97.2 F (36.2 C) (Oral)  Wt 184 lb (83.462 kg)   CC: knee pain  Subjective:    Patient ID: Christian Maxwell, male    DOB: April 29, 1933, 80 y.o.   MRN: [id number]  HPI: Christian Maxwell is a 80 y.o. male presenting on 03/17/2014 for Knee Pain   1 mo h/o bilateral knee pain L>R worse with bending knee.  No locking of knee. Mild weakness/instability. No redness or swelling or warmth. So far has tried tylenol which helps some. Doesn't like to take oxycodone. No h/o knee trouble in the past or surgery. No other significant joint pains currently. Did have a fall 1 wk ago at [employer] and hit L knee but knee pain present even prior to this. Worse pain with stationary bicycling.  H/o traumatic knee hematoma 03/2013 s/p aspiration by ortho. That healed well.   Pt states he had flu shot, declines pneumonia shot today.  Relevant past medical, surgical, family and social history reviewed and updated as indicated.  Allergies and medications reviewed and updated. Current Outpatient Prescriptions on File Prior to Visit  Medication Sig  . aspirin 81 MG tablet Take 162 mg by mouth daily.   [name] CALCIUM PO Take 1 tablet by mouth daily.   . chlorhexidine (HIBICLENS) 4 % external liquid Apply topically daily as needed. Cleans both wounds with this soap in the shower daily.  [name] donepezil (ARICEPT) 10 MG tablet Take 10 mg by mouth at bedtime.  . fish oil-omega-3 fatty acids 1000 MG capsule Take 1 g by mouth daily.   . folic acid (FOLVITE) 1 MG tablet Take 1 mg by mouth daily.  . metoprolol tartrate (LOPRESSOR) 25 MG tablet Take 1 tablet (25 mg total) by mouth 2 (two) times daily.  . nitroGLYCERIN (NITROSTAT) 0.4 MG SL tablet Place 1 tablet (0.4 mg total) under the tongue every 5 (five) minutes as needed for chest pain.  [name] oxyCODONE-acetaminophen (ROXICET) 5-325 MG per tablet Take 1-2 tablets by mouth every 4 (four) hours as needed for severe pain.  . simvastatin  (ZOCOR) 40 MG tablet Take 1 tablet (40 mg total) by mouth every evening.  . Thiamine HCl (VITAMIN B-1 PO) Take 1 tablet by mouth daily.   [name] warfarin (COUMADIN) 5 MG tablet TAKE AS DIRECTED BY COUMADIN CLINIC   No current facility-administered medications on file prior to visit.   Past Medical History  Diagnosis Date  . Hyperlipidemia   . Syncope and collapse     pt denies  . Atrial fibrillation   . PVC's (premature ventricular contractions)   . Abscess 7/13    abd abscess  . History of colon cancer 2000    T3N0 s/p colectomy  . Normal nuclear stress test 2004  . METHICILLIN RESISTANT STAPHYLOCOCCUS AUREUS INFECTION 12/12/2006    Annotation: stitch abcess in lower abdomen after colon cancer resection Qualifier: Diagnosis of  By: [name] MD, [name]    . Postoperative stitch abscess 07/19/2011  . Hx of adenomatous colonic polyps   . Thyroid disease     followed by [name]  . Insomnia   . Skin abscess     recurrent  . Hypertension     pt states he does not have this. 02/26/13  . Myocardial infarction     at age 61 years  . Cancer     colon cancer 2000  . Arthritis     bursitis in left shoulder  Past Surgical History  Procedure Laterality Date  . Lumbar laminectomy  1977  . Wound exploration  2006    For possible stitch abscess Dr [name]  . Incise and drain abcess  2009,2010,2011,2012  . Colectomy  2000    6 inches removed  . Mass excision  03/07/2012    EXCISION MASS;  Surgeon: [name], MD;  Laterality: Right;  removal mass posterior right arm  . Polypectomy    . Colonoscopy  2012    per patient (Dr. [name])  . Incision and drainage abscess N/A 09/20/2012    Procedure: INCISION AND DRAINAGE SUPRAPUBIC ABSCESS;  Surgeon: [name], MD;  Location: [facility];  Service: General;  Laterality: N/A;  . Hernia repair  0/1749    DB umbilical hernia  . Eye surgery      cataract surgery both eyes  . Incision and drainage abscess Left 02/28/2013     Procedure: INCISION AND DRAINAGE LEFT ARM ABSCESS;  Surgeon: [name], MD;  Location: [facility];  Service: General;  Laterality: Left;  . Incision and drainage Right 04/02/2013    Procedure: INCISION AND DRAINAGE RIGHT KNEE HEMATOMA;  Surgeon: [name], MD;  Location: [facility];  Service: Orthopedics;  Laterality: Right;   Review of Systems Per HPI unless specifically indicated above    Objective:    BP 118/82 mmHg  Pulse 80  Temp(Src) 97.2 F (36.2 C) (Oral)  Wt 184 lb (83.462 kg)  Physical Exam  Constitutional: He appears well-developed and well-nourished. No distress.  Musculoskeletal: He exhibits no edema.  Bilateral Knee exam: No deformity on inspection. No pain with palpation of knee landmarks. No effusion/swelling noted. FROM in flex/extension without crepitus. No popliteal fullness. Neg drawer test. Neg mcmurray test. No pain with valgus/varus stress. No PFgrind. No abnormal patellar mobility.  Nursing note and vitals reviewed.  Results for orders placed or performed in visit on 02/24/14  POCT INR  Result Value Ref Range   INR 2.6       Assessment & Plan:   Problem List Items Addressed This Visit    Bilateral knee pain - Primary    Overall reassuringly normal exam  Anticipate arthritis flare. Discussed tylenol use, oxycodone use, elevation of leg, rest, ice/heating pad, and provided with stretching/strengthening exercises from [facility] pt advisor. Also discussed lateral leg raises to build VMO to stabilize knee. Avoid NSAIDs as on coumadin.        Follow up plan: No Follow-up on file.

## 2014-03-17 NOTE — Patient Instructions (Signed)
Knee is looking ok today. I wonder about an arthritis flare. Do lateral leg raises 3 sets of 10 throughout the day. Continue tylenol as needed And may use ice or heating pad (whichever soothes pain better). May also use stretching exercises provided to help strengthen muscles around knees. Good to see you, call us if not improved with above.

## 2014-03-17 NOTE — Progress Notes (Signed)
Pre visit review using our clinic review tool, if applicable. No additional management support is needed unless otherwise documented below in the visit note. 

## 2014-03-17 NOTE — Assessment & Plan Note (Addendum)
Overall reassuringly normal exam  Anticipate arthritis flare. Discussed tylenol use, oxycodone use, elevation of leg, rest, ice/heating pad, and provided with stretching/strengthening exercises from [facility] pt advisor. Also discussed lateral leg raises to build VMO to stabilize knee. Avoid NSAIDs as on coumadin.

## 2014-03-24 ENCOUNTER — Ambulatory Visit (INDEPENDENT_AMBULATORY_CARE_PROVIDER_SITE_OTHER): Payer: Medicare Other | Admitting: *Deleted

## 2014-03-24 DIAGNOSIS — Z5181 Encounter for therapeutic drug level monitoring: Secondary | ICD-10-CM | POA: Diagnosis not present

## 2014-03-24 DIAGNOSIS — I4891 Unspecified atrial fibrillation: Secondary | ICD-10-CM | POA: Diagnosis not present

## 2014-03-24 LAB — POCT INR: INR: 3.4

## 2014-03-25 DIAGNOSIS — L02211 Cutaneous abscess of abdominal wall: Secondary | ICD-10-CM | POA: Diagnosis not present

## 2014-03-31 ENCOUNTER — Ambulatory Visit: Payer: Medicare Other | Admitting: Cardiovascular Disease

## 2014-04-01 ENCOUNTER — Ambulatory Visit
Admission: RE | Admit: 2014-04-01 | Discharge: 2014-04-01 | Disposition: A | Discharge status: Home / Self Care | Payer: Medicare Other | Source: Ambulatory Visit | Attending: Family Medicine | Admitting: Family Medicine

## 2014-04-01 ENCOUNTER — Ambulatory Visit (INDEPENDENT_AMBULATORY_CARE_PROVIDER_SITE_OTHER): Payer: Medicare Other | Admitting: Family Medicine

## 2014-04-01 ENCOUNTER — Encounter: Payer: Self-pay | Admitting: Family Medicine

## 2014-04-01 ENCOUNTER — Ambulatory Visit (INDEPENDENT_AMBULATORY_CARE_PROVIDER_SITE_OTHER)
Admission: RE | Admit: 2014-04-01 | Discharge: 2014-04-01 | Disposition: A | Discharge status: Home / Self Care | Payer: Medicare Other | Source: Ambulatory Visit | Attending: Family Medicine | Admitting: Family Medicine

## 2014-04-01 VITALS — BP 104/62 | HR 68 | Temp 97.5°F | Ht 70.0 in | Wt 183.0 lb

## 2014-04-01 DIAGNOSIS — M25561 Pain in right knee: Secondary | ICD-10-CM | POA: Diagnosis not present

## 2014-04-01 DIAGNOSIS — M170 Bilateral primary osteoarthritis of knee: Secondary | ICD-10-CM

## 2014-04-01 DIAGNOSIS — M1711 Unilateral primary osteoarthritis, right knee: Secondary | ICD-10-CM | POA: Diagnosis not present

## 2014-04-01 DIAGNOSIS — M25562 Pain in left knee: Secondary | ICD-10-CM

## 2014-04-01 DIAGNOSIS — M1712 Unilateral primary osteoarthritis, left knee: Secondary | ICD-10-CM | POA: Diagnosis not present

## 2014-04-01 MED ORDER — METHYLPREDNISOLONE ACETATE 40 MG/ML IJ SUSP
80.0000 mg | Freq: Once | INTRAMUSCULAR | Status: AC
  Administered 2014-04-01: 80 mg via INTRA_ARTICULAR

## 2014-04-01 NOTE — Progress Notes (Signed)
Pre visit review using our clinic review tool, if applicable. No additional management support is needed unless otherwise documented below in the visit note. 

## 2014-04-01 NOTE — Progress Notes (Signed)
Dr. Frederico Hamman T. Quinci Gavidia, MD, [facility] [place], [address] Phone: [phone] Fax: [phone]  04/01/2014  Patient: Christian Maxwell, MRN: [id number], DOB: March 23, 1933, 80 y.o.  Primary Physician:  Ria Bush, MD  Chief Complaint: Knee Pain  Subjective:   Christian Maxwell is a 80 y.o. very pleasant male patient who presents with the following:  Pleasant elderly gentleman on Coumadin chronically for atrial fibrillation, who also takes Roxicet who is here for evaluation of bilateral knee pain. He has not had any particular trauma or accident. He has not had any prior knee operations other than the evacuation of a hematoma previously by Dr. Ninfa Linden. He is particularly limited in the morning, and he has difficulty getting up and down steps and upper rising position.  Bilateral knee pain.  Limited some in the morning - not in the morning.  Barely get up the steps.   Past Medical History, Surgical History, Social History, Family History, Problem List, Medications, and Allergies have been reviewed and updated if relevant.  OA  Bilateral knee injections  GEN: No fevers, chills. Nontoxic. Primarily MSK c/o today. MSK: Detailed in the HPI GI: tolerating PO intake without difficulty Neuro: No numbness, parasthesias, or tingling associated. Otherwise the pertinent positives of the ROS are noted above.   Objective:   BP 104/62 mmHg  Pulse 68  Temp(Src) 97.5 F (36.4 C) (Oral)  Ht 5\' 10"  (1.778 m)  Wt 183 lb (83.008 kg)  BMI 26.26 kg/m2   GEN: WDWN, NAD, Non-toxic, Alert & Oriented x 3 HEENT: Atraumatic, Normocephalic.  Ears and Nose: No external deformity. EXTR: No clubbing/cyanosis/edema NEURO: Normal gait.  PSYCH: Normally interactive. Conversant. Not depressed or anxious appearing.  Calm demeanor.    Bilateral knee exam: Lacks 10 of extension. Flexion to 110. Minimal effusion. Stable MCL, LCL, anterior  cruciate ligament, and PCL. Flexion pinch is tender. McMurray's is tender without mechanical symptoms. Pain on medial and lateral joint lines. The patella does not move much  Radiology: Dg Knee 4 Views W/patella Left  04/01/2014   CLINICAL DATA:  Bilateral knee pain  EXAM: LEFT KNEE - COMPLETE 4+ VIEW  COMPARISON:  None.  FINDINGS: There is mild tricompartmental degenerative joint disease of the left knee for age. There is some loss of joint space medially, laterally, and at the patella femoral articulation with sclerosis. There is chondrocalcinosis present which may indicate CPPD. No joint effusion is seen.  IMPRESSION: Mild tricompartmental degenerative joint disease. Chondrocalcinosis. Question CPPD.   Electronically Signed   By: Ivar Drape M.D.   On: 04/01/2014 13:39   Dg Knee 4 Views W/patella Right  04/01/2014   CLINICAL DATA:  Bilateral knee pain  EXAM: RIGHT KNEE - COMPLETE 4+ VIEW  COMPARISON:  None.  FINDINGS: There is mild tricompartmental degenerative joint disease of the right knee for age. There is some loss of joint space involving all 3 compartments with minimal sclerosis and spurring for age. Faint chondrocalcinosis is seen which may indicate CPPD. No fracture or effusion is seen. The patella is normally positioned within the patellofemoral groove.  IMPRESSION: Mild tricompartmental degenerative joint disease. Chondrocalcinosis. Question CPPD   Electronically Signed   By: Ivar Drape M.D.   On: 04/01/2014 13:41     Assessment and Plan:   Primary osteoarthritis of both knees  Bilateral knee pain - Plan: DG Knee 4 Views W/Patella Right, DG Knee 4 Views W/Patella Left, methylPREDNISolone acetate (DEPO-MEDROL) injection  80 mg, methylPREDNISolone acetate (DEPO-MEDROL) injection 80 mg  Pleasant gentleman who is on Coumadin for atrial fibrillation, who has until now been taking intermittent ibuprofen. He also takes some intermittent Roxicet for pain.  He has moderate to severe  bilateral osteoarthritis. He has not had corticosteroid injections previously. We are going to try to do bilateral injections to see if this brings him some relief.  Knee Injection, RIGHT Patient verbally consented to procedure. Risks (including potential rare risk of infection), benefits, and alternatives explained. Sterilely prepped with Chloraprep. Ethyl cholride used for anesthesia. 8 cc Lidocaine 1% mixed with Depo-Medrol 80 mg injected using the anteromedial approach without difficulty. No complications with procedure and tolerated well. Patient had decreased pain post-injection.   Knee Injection, LEFT Patient verbally consented to procedure. Risks (including potential rare risk of infection), benefits, and alternatives explained. Sterilely prepped with Chloraprep. Ethyl cholride used for anesthesia. 8 cc Lidocaine 1% mixed with Depo-Medrol 80 mg injected using the anteromedial approach without difficulty. No complications with procedure and tolerated well. Patient had decreased pain post-injection.   Follow-up: prn  New Prescriptions   No medications on file   Orders Placed This Encounter  Procedures  . DG Knee 4 Views W/Patella Right  . DG Knee 4 Views W/Patella Left    Signed,  Christian Cindric T. Rahkim Rabalais, MD   Patient's Medications  New Prescriptions   No medications on file  Previous Medications   ASPIRIN 81 MG TABLET    Take 162 mg by mouth daily.    CALCIUM PO    Take 1 tablet by mouth daily.    CHLORHEXIDINE (HIBICLENS) 4 % EXTERNAL LIQUID    Apply topically daily as needed. Cleans both wounds with this soap in the shower daily.   DONEPEZIL (ARICEPT) 10 MG TABLET    Take 10 mg by mouth at bedtime.   FISH OIL-OMEGA-3 FATTY ACIDS 1000 MG CAPSULE    Take 1 g by mouth daily.    FOLIC ACID (FOLVITE) 1 MG TABLET    Take 1 mg by mouth daily.   METOPROLOL TARTRATE (LOPRESSOR) 25 MG TABLET    Take 1 tablet (25 mg total) by mouth 2 (two) times daily.   NITROGLYCERIN (NITROSTAT) 0.4 MG  SL TABLET    Place 1 tablet (0.4 mg total) under the tongue every 5 (five) minutes as needed for chest pain.   OXYCODONE-ACETAMINOPHEN (ROXICET) 5-325 MG PER TABLET    Take 1-2 tablets by mouth every 4 (four) hours as needed for severe pain.   SIMVASTATIN (ZOCOR) 40 MG TABLET    Take 1 tablet (40 mg total) by mouth every evening.   THIAMINE HCL (VITAMIN B-1 PO)    Take 1 tablet by mouth daily.    WARFARIN (COUMADIN) 5 MG TABLET    TAKE AS DIRECTED BY COUMADIN CLINIC  Modified Medications   No medications on file  Discontinued Medications   No medications on file

## 2014-04-15 ENCOUNTER — Ambulatory Visit (INDEPENDENT_AMBULATORY_CARE_PROVIDER_SITE_OTHER): Payer: Medicare Other | Admitting: Pharmacist

## 2014-04-15 DIAGNOSIS — Z5181 Encounter for therapeutic drug level monitoring: Secondary | ICD-10-CM

## 2014-04-15 DIAGNOSIS — I4891 Unspecified atrial fibrillation: Secondary | ICD-10-CM

## 2014-04-15 LAB — POCT INR: INR: 3.9

## 2014-04-20 ENCOUNTER — Other Ambulatory Visit: Payer: Self-pay | Admitting: Cardiovascular Disease

## 2014-04-29 ENCOUNTER — Ambulatory Visit (INDEPENDENT_AMBULATORY_CARE_PROVIDER_SITE_OTHER): Payer: Medicare Other | Admitting: Pharmacist

## 2014-04-29 DIAGNOSIS — Z5181 Encounter for therapeutic drug level monitoring: Secondary | ICD-10-CM

## 2014-04-29 DIAGNOSIS — I4891 Unspecified atrial fibrillation: Secondary | ICD-10-CM | POA: Diagnosis not present

## 2014-04-29 LAB — POCT INR: INR: 2.9

## 2014-05-20 ENCOUNTER — Ambulatory Visit (INDEPENDENT_AMBULATORY_CARE_PROVIDER_SITE_OTHER): Payer: Medicare Other | Admitting: *Deleted

## 2014-05-20 DIAGNOSIS — I4891 Unspecified atrial fibrillation: Secondary | ICD-10-CM | POA: Diagnosis not present

## 2014-05-20 DIAGNOSIS — Z5181 Encounter for therapeutic drug level monitoring: Secondary | ICD-10-CM

## 2014-05-20 LAB — POCT INR: INR: 2.4

## 2014-05-28 ENCOUNTER — Encounter (HOSPITAL_COMMUNITY): Payer: Self-pay | Admitting: General Surgery

## 2014-06-17 ENCOUNTER — Ambulatory Visit (INDEPENDENT_AMBULATORY_CARE_PROVIDER_SITE_OTHER): Payer: Medicare Other | Admitting: *Deleted

## 2014-06-17 DIAGNOSIS — I4891 Unspecified atrial fibrillation: Secondary | ICD-10-CM

## 2014-06-17 DIAGNOSIS — Z5181 Encounter for therapeutic drug level monitoring: Secondary | ICD-10-CM | POA: Diagnosis not present

## 2014-06-17 LAB — POCT INR: INR: 3.2

## 2014-06-26 ENCOUNTER — Ambulatory Visit (INDEPENDENT_AMBULATORY_CARE_PROVIDER_SITE_OTHER): Payer: Medicare Other | Admitting: *Deleted

## 2014-06-26 DIAGNOSIS — I4891 Unspecified atrial fibrillation: Secondary | ICD-10-CM

## 2014-06-26 DIAGNOSIS — Z5181 Encounter for therapeutic drug level monitoring: Secondary | ICD-10-CM | POA: Diagnosis not present

## 2014-06-26 LAB — POCT INR: INR: 2.4

## 2014-07-15 ENCOUNTER — Encounter: Payer: Self-pay | Admitting: Family Medicine

## 2014-07-15 ENCOUNTER — Ambulatory Visit (INDEPENDENT_AMBULATORY_CARE_PROVIDER_SITE_OTHER): Payer: Medicare Other | Admitting: Family Medicine

## 2014-07-15 VITALS — BP 119/75 | HR 74 | Temp 98.5°F | Ht 70.0 in | Wt 190.5 lb

## 2014-07-15 DIAGNOSIS — M25511 Pain in right shoulder: Secondary | ICD-10-CM | POA: Diagnosis not present

## 2014-07-15 DIAGNOSIS — L02211 Cutaneous abscess of abdominal wall: Secondary | ICD-10-CM | POA: Diagnosis not present

## 2014-07-15 DIAGNOSIS — M7541 Impingement syndrome of right shoulder: Secondary | ICD-10-CM

## 2014-07-15 DIAGNOSIS — M7501 Adhesive capsulitis of right shoulder: Secondary | ICD-10-CM | POA: Diagnosis not present

## 2014-07-15 DIAGNOSIS — M75101 Unspecified rotator cuff tear or rupture of right shoulder, not specified as traumatic: Secondary | ICD-10-CM | POA: Diagnosis not present

## 2014-07-15 MED ORDER — METHYLPREDNISOLONE ACETATE 40 MG/ML IJ SUSP
80.0000 mg | Freq: Once | INTRAMUSCULAR | Status: AC
  Administered 2014-07-15: 80 mg via INTRA_ARTICULAR

## 2014-07-15 NOTE — Progress Notes (Signed)
Dr. Frederico Hamman T. Teasia Zapf, MD, [facility] [place], [address] Phone: [phone] Fax: [phone]  07/15/2014  Patient: Christian Maxwell, MRN: [id number], DOB: March 19, 1933, 81 y.o.  Primary Physician:  Ria Bush, MD  Chief Complaint: Shoulder Pain  Subjective:   Christian Maxwell is a 81 y.o. very pleasant male patient who presents with the following: shoulder pain  The patient noted above presents with shoulder pain that has been ongoing for 3 mo there is no history of trauma or accident. The patient denies neck pain or radicular symptoms. No shoulder blade pain Denies dislocation, subluxation, separation of the shoulder. The patient does complain of pain with flexion, abduction, and terminal motion.  Significant restriction of motion. he describes a deep ache around the shoulder, and sometimes it will wake the patient up at night.  Medications Tried: tylenol Ice or Heat: minimal help Tried PT: No  Prior shoulder Injury: No Prior surgery: No Prior fracture: No  Past Medical History, Surgical History, Social History, Family History, Medications, and allergies reviewed and updated if relevant.   GEN: No fevers, chills. Nontoxic. Primarily MSK c/o today. MSK: Detailed in the HPI GI: tolerating PO intake without difficulty Neuro: No numbness, parasthesias, or tingling associated. Otherwise the pertinent positives of the ROS are noted above.    Objective:   Blood pressure 119/75, pulse 74, temperature 98.5 F (36.9 C), temperature source Oral, height 5\' 10"  (1.778 m), weight 190 lb 8 oz (86.41 kg).  GEN: Well-developed,well-nourished,in no acute distress; alert,appropriate and cooperative throughout examination HEENT: Normocephalic and atraumatic without obvious abnormalities. Ears, externally no deformities PULM: Breathing comfortably in no respiratory distress EXT: No clubbing, cyanosis, or edema PSYCH:  Normally interactive. Cooperative during the interview. Pleasant. Friendly and conversant. Not anxious or depressed appearing. Normal, full affect.  Shoulder: R Inspection: No muscle wasting or winging Ecchymosis/edema: neg  AC joint, scapula, clavicle: NT Cervical spine: NT, full ROM Spurling's: neg Abduction: 4+/5, to 140 Flexion: 5/5, 150 IR, full, lift-off: 5/5, minimal ER at neutral: 5/5, 70 AC crossover and compression: unable to complete Additional special testing is equivocal given lack of motion C5-T1 intact Sensation intact Grip 5/5  Assessment and Plan:   Capsulitis, adhesive shoulder, right [M75.01]  Right shoulder pain - Plan: methylPREDNISolone acetate (DEPO-MEDROL) injection 80 mg  Rotator cuff impingement syndrome, right [M75.101]  >25 minutes spent in face to face time with patient, >50% spent in counselling or coordination of care  Patient was given a systematic ROM protocol from [employer] to be done daily. Emphasized importance of adherence, help of PT, daily HEP.  The average length of total symptoms is 12 months going through 3 different phases in the freezing and thawing process. Reviewed all with patient.   Tylenol for pain relief Intraarticular shoulder injections discussed with patient, which have good evidence for accelerating the thawing phase.  Will need RTC str and scapular stabilization to fix underlying mechanics.  Intrarticular Shoulder Injection, R Verbal consent was obtained from the patient. Risks including infection explained and contrasted with benefits and alternatives. Patient prepped with Chloraprep and Ethyl Chloride used for anesthesia. An intraarticular shoulder injection was performed using the posterior approach. The patient tolerated the procedure well and had decreased pain post injection. No complications. Injection: 4 cc of Lidocaine 1% and Depo-Medrol 40 mg. Needle: 22 gauge   SubAC Injection, R Verbal consent was obtained from  the patient. Risks (including rare infection), benefits, and alternatives were  explained. Patient prepped with Chloraprep and Ethyl Chloride used for anesthesia. The subacromial space was injected using the posterior approach. The patient tolerated the procedure well and had decreased pain post injection. No complications. Injection: 4 cc of Lidocaine 1% and Depo-Medrol 40 mg. Needle: 22 gauge   Follow-up: Return in 2 months (on 09/14/2014). if not improving   Signed,  Lenis Nettleton T. Emma-Lee Oddo, MD   Patient's Medications  New Prescriptions   No medications on file  Previous Medications   ASPIRIN 81 MG TABLET    Take 162 mg by mouth daily.    CALCIUM PO    Take 1 tablet by mouth daily.    CHLORHEXIDINE (HIBICLENS) 4 % EXTERNAL LIQUID    Apply topically daily as needed. Cleans both wounds with this soap in the shower daily.   DONEPEZIL (ARICEPT) 10 MG TABLET    Take 10 mg by mouth at bedtime.   FISH OIL-OMEGA-3 FATTY ACIDS 1000 MG CAPSULE    Take 1 g by mouth daily.    FOLIC ACID (FOLVITE) 1 MG TABLET    Take 1 mg by mouth daily.   METOPROLOL TARTRATE (LOPRESSOR) 25 MG TABLET    Take 1 tablet (25 mg total) by mouth 2 (two) times daily.   NITROGLYCERIN (NITROSTAT) 0.4 MG SL TABLET    Place 1 tablet (0.4 mg total) under the tongue every 5 (five) minutes as needed for chest pain.   OXYCODONE-ACETAMINOPHEN (ROXICET) 5-325 MG PER TABLET    Take 1-2 tablets by mouth every 4 (four) hours as needed for severe pain.   SIMVASTATIN (ZOCOR) 40 MG TABLET    TAKE 1 TABLET BY MOUTH EVERY MORNING   THIAMINE HCL (VITAMIN B-1 PO)    Take 1 tablet by mouth daily.    WARFARIN (COUMADIN) 5 MG TABLET    TAKE AS DIRECTED BY COUMADIN CLINIC  Modified Medications   No medications on file  Discontinued Medications   No medications on file

## 2014-07-15 NOTE — Progress Notes (Signed)
Pre visit review using our clinic review tool, if applicable. No additional management support is needed unless otherwise documented below in the visit note. 

## 2014-07-17 ENCOUNTER — Ambulatory Visit (INDEPENDENT_AMBULATORY_CARE_PROVIDER_SITE_OTHER): Payer: Medicare Other

## 2014-07-17 DIAGNOSIS — Z5181 Encounter for therapeutic drug level monitoring: Secondary | ICD-10-CM

## 2014-07-17 DIAGNOSIS — I4891 Unspecified atrial fibrillation: Secondary | ICD-10-CM

## 2014-07-17 LAB — POCT INR: INR: 2.4

## 2014-07-28 ENCOUNTER — Other Ambulatory Visit: Payer: Self-pay

## 2014-07-28 MED ORDER — METOPROLOL TARTRATE 25 MG PO TABS: 25.0000 mg | ORAL_TABLET | Freq: Two times a day (BID) | ORAL | Status: DC

## 2014-08-03 ENCOUNTER — Other Ambulatory Visit: Payer: Self-pay | Admitting: Cardiology

## 2014-08-17 ENCOUNTER — Ambulatory Visit (INDEPENDENT_AMBULATORY_CARE_PROVIDER_SITE_OTHER): Payer: Medicare Other | Admitting: *Deleted

## 2014-08-17 DIAGNOSIS — Z5181 Encounter for therapeutic drug level monitoring: Secondary | ICD-10-CM

## 2014-08-17 DIAGNOSIS — I4891 Unspecified atrial fibrillation: Secondary | ICD-10-CM

## 2014-08-17 LAB — POCT INR: INR: 2.5

## 2014-08-31 DIAGNOSIS — I480 Paroxysmal atrial fibrillation: Secondary | ICD-10-CM | POA: Diagnosis not present

## 2014-08-31 DIAGNOSIS — C189 Malignant neoplasm of colon, unspecified: Secondary | ICD-10-CM | POA: Diagnosis not present

## 2014-08-31 DIAGNOSIS — E785 Hyperlipidemia, unspecified: Secondary | ICD-10-CM | POA: Diagnosis not present

## 2014-08-31 DIAGNOSIS — I2510 Atherosclerotic heart disease of native coronary artery without angina pectoris: Secondary | ICD-10-CM | POA: Diagnosis not present

## 2014-08-31 DIAGNOSIS — R413 Other amnesia: Secondary | ICD-10-CM | POA: Diagnosis not present

## 2014-08-31 DIAGNOSIS — R740 Nonspecific elevation of levels of transaminase and lactic acid dehydrogenase [LDH]: Secondary | ICD-10-CM | POA: Diagnosis not present

## 2014-08-31 DIAGNOSIS — E0500 Thyrotoxicosis with diffuse goiter without thyrotoxic crisis or storm: Secondary | ICD-10-CM | POA: Diagnosis not present

## 2014-08-31 DIAGNOSIS — Z1389 Encounter for screening for other disorder: Secondary | ICD-10-CM | POA: Diagnosis not present

## 2014-08-31 DIAGNOSIS — M7500 Adhesive capsulitis of unspecified shoulder: Secondary | ICD-10-CM | POA: Diagnosis not present

## 2014-08-31 DIAGNOSIS — Z6827 Body mass index (BMI) 27.0-27.9, adult: Secondary | ICD-10-CM | POA: Diagnosis not present

## 2014-09-14 ENCOUNTER — Encounter (INDEPENDENT_AMBULATORY_CARE_PROVIDER_SITE_OTHER): Payer: Medicare Other | Admitting: Cardiovascular Disease

## 2014-09-14 DIAGNOSIS — R0789 Other chest pain: Secondary | ICD-10-CM

## 2014-09-14 DIAGNOSIS — I4891 Unspecified atrial fibrillation: Secondary | ICD-10-CM | POA: Diagnosis not present

## 2014-09-14 NOTE — Progress Notes (Signed)
This encounter was created in error - please disregard.

## 2014-09-15 ENCOUNTER — Ambulatory Visit (INDEPENDENT_AMBULATORY_CARE_PROVIDER_SITE_OTHER): Payer: Medicare Other | Admitting: *Deleted

## 2014-09-15 DIAGNOSIS — I4891 Unspecified atrial fibrillation: Secondary | ICD-10-CM | POA: Diagnosis not present

## 2014-09-15 DIAGNOSIS — Z5181 Encounter for therapeutic drug level monitoring: Secondary | ICD-10-CM

## 2014-09-15 LAB — POCT INR: INR: 2.3

## 2014-09-23 DIAGNOSIS — L02211 Cutaneous abscess of abdominal wall: Secondary | ICD-10-CM | POA: Diagnosis not present

## 2014-09-28 ENCOUNTER — Encounter: Payer: Self-pay | Admitting: Cardiovascular Disease

## 2014-09-28 ENCOUNTER — Ambulatory Visit (INDEPENDENT_AMBULATORY_CARE_PROVIDER_SITE_OTHER): Payer: Medicare Other | Admitting: Cardiovascular Disease

## 2014-09-28 ENCOUNTER — Ambulatory Visit: Payer: Medicare Other | Admitting: Family Medicine

## 2014-09-28 VITALS — BP 90/62 | HR 50 | Ht 70.0 in | Wt 187.0 lb

## 2014-09-28 DIAGNOSIS — I4891 Unspecified atrial fibrillation: Secondary | ICD-10-CM | POA: Diagnosis not present

## 2014-09-28 DIAGNOSIS — E785 Hyperlipidemia, unspecified: Secondary | ICD-10-CM

## 2014-09-28 DIAGNOSIS — R0789 Other chest pain: Secondary | ICD-10-CM

## 2014-09-28 MED ORDER — NITROGLYCERIN 0.4 MG SL SUBL: 0.4000 mg | SUBLINGUAL_TABLET | SUBLINGUAL | Status: DC | PRN

## 2014-09-28 NOTE — Progress Notes (Signed)
Cardiology Office Note   Date:  09/28/2014   ID:  Christian Maxwell, DOB 12-Dec-1932, MRN [id number]  PCP:  Ria Bush, MD  Cardiologist:  Acie Fredrickson, Wonda Cheng, MD   Chief Complaint  Patient presents with  . Atrial Fibrillation   1 . Atrial fibrillation 2. Hyperlipidemia 3. Premature ventricular contractions 4. Hypothyroidism   Christian Maxwell is a 81 year old gentleman with a history of atrial fibrillation, hyperlipidemia, and a history of PVCs. He exercises on a regular basis. He has not had any episodes of chest pain or shortness of breath. He denies any syncope or presyncope.  He's doing much better. He was having some shortness of breath earlier in the year and the end of last year.  Dec. 4, 2013 :  He presents today with some right sided chest wall pain. The discomfort increases with deep breath, right arm movement and with twisting of his torso. No dyspnea. No angina.   May 22, 2011: He's doing very well at this point. He is no longer having any episodes of chest wall pain. He still rides a stationary bike for 30 minutes twice a day and does not have any episodes of pain.  Feb. 5, 2015:  Christian Maxwell has no complaints. He was seen with his wife. It is clear that he is gradually getting more and more demented. His wife states that he breathes heavily when climbing stairs. He's able to do all of his normal activities without any significant difficulties. He does notice that he'll typically develop some chest pain that she's late taking his second metoprolol dose of the day.   Sep 30, 2013:  Christian Maxwell is doing well. Stays busy.   Oct. 5, 2015:   Christian Maxwell continues to have DOE. His wife says that he has lots of dyspnea - he denies it.    Sep 28, 2014:  Christian Maxwell is a 81 y.o. male who presents for atrial fib .  He has had more dyspnea and some chest pain  - especially with exertion.  Has difficulty describing his chest pain .   We have suggested a stress  test in the past.  He now agrees to do the test.   Past Medical History  Diagnosis Date  . Hyperlipidemia   . Syncope and collapse     pt denies  . Atrial fibrillation   . PVC's (premature ventricular contractions)   . Abscess 7/13    abd abscess  . History of colon cancer 2000    T3N0 s/p colectomy  . Normal nuclear stress test 2004  . METHICILLIN RESISTANT STAPHYLOCOCCUS AUREUS INFECTION 12/12/2006    Annotation: stitch abcess in lower abdomen after colon cancer resection Qualifier: Diagnosis of  By: [name] MD, [name]    . Postoperative stitch abscess 07/19/2011  . Hx of adenomatous colonic polyps   . Thyroid disease     followed by [name]  . Insomnia   . Skin abscess     recurrent  . Hypertension     pt states he does not have this. 02/26/13  . Myocardial infarction     at age 89 years  . Cancer     colon cancer 2000  . Arthritis     bursitis in left shoulder    Past Surgical History  Procedure Laterality Date  . Lumbar laminectomy  1977  . Wound exploration  2006    For possible stitch abscess Dr [name]  . Incise and drain abcess  2009,2010,2011,2012  . Colectomy  2000    6 inches removed  . Mass excision  03/07/2012    EXCISION MASS;  Surgeon: [name], MD;  Laterality: Right;  removal mass posterior right arm  . Polypectomy    . Colonoscopy  2012    per patient (Dr. [name])  . Incision and drainage abscess N/A 09/20/2012    Procedure: INCISION AND DRAINAGE SUPRAPUBIC ABSCESS;  Surgeon: [name], MD;  Location: [facility];  Service: General;  Laterality: N/A;  . Hernia repair  01/5187    DB umbilical hernia  . Eye surgery      cataract surgery both eyes  . Incision and drainage abscess Left 02/28/2013    Procedure: INCISION AND DRAINAGE LEFT ARM ABSCESS;  Surgeon: [name], MD;  Location: [facility];  Service: General;  Laterality: Left;  . Incision and drainage Right 04/02/2013    Procedure: INCISION AND DRAINAGE RIGHT  KNEE HEMATOMA;  Surgeon: [name], MD;  Location: [facility];  Service: Orthopedics;  Laterality: Right;     Current Outpatient Prescriptions  Medication Sig Dispense Refill  . aspirin 81 MG tablet Take 162 mg by mouth daily.     [name] CALCIUM PO Take 1 tablet by mouth daily.     [name] donepezil (ARICEPT) 10 MG tablet Take 10 mg by mouth at bedtime.    . fish oil-omega-3 fatty acids 1000 MG capsule Take 1 g by mouth daily.     . folic acid (FOLVITE) 1 MG tablet Take 1 mg by mouth daily.    . metoprolol tartrate (LOPRESSOR) 25 MG tablet Take 1 tablet (25 mg total) by mouth 2 (two) times daily. 60 tablet 3  . nitroGLYCERIN (NITROSTAT) 0.4 MG SL tablet Place 1 tablet (0.4 mg total) under the tongue every 5 (five) minutes as needed for chest pain. 90 tablet 3  . simvastatin (ZOCOR) 40 MG tablet TAKE 1 TABLET BY MOUTH EVERY MORNING 90 tablet 1  . Thiamine HCl (VITAMIN B-1 PO) Take 1 tablet by mouth daily.     [name] warfarin (COUMADIN) 5 MG tablet TAKE AS DIRECTED 40 tablet 3   No current facility-administered medications for this visit.    Allergies:   Review of patient's allergies indicates no known allergies.    Social History:  The patient  reports that he quit smoking about 36 years ago. He quit smokeless tobacco use about 36 years ago. His smokeless tobacco use included Chew. He reports that he does not drink alcohol or use illicit drugs.   Family History:  The patient's family history includes CAD (age of onset: 94) in his brother; CAD (age of onset: 5) in his father; Dementia in his brother and father; Dementia (age of onset: 71) in his sister; Diabetes in his brother; Hypertension in his father; Stroke in his brother, brother, father, and mother; Stroke (age of onset: 75) in his sister. There is no history of Cancer.    ROS:  Please see the history of present illness.    Review of Systems: Constitutional:  denies fever, chills, diaphoresis, appetite change and fatigue.  HEENT:  denies photophobia, eye pain, redness, hearing loss, ear pain, congestion, sore throat, rhinorrhea, sneezing, neck pain, neck stiffness and tinnitus.  Respiratory: denies SOB, DOE, cough, chest tightness, and wheezing.  Cardiovascular: denies chest pain, palpitations and leg swelling.  Gastrointestinal: denies nausea, vomiting, abdominal pain, diarrhea, constipation, blood in stool.  Genitourinary: denies dysuria, urgency, frequency, hematuria, flank pain and difficulty urinating.  Musculoskeletal: denies  myalgias, back pain, joint swelling, arthralgias and gait problem.   Skin: denies pallor, rash and wound.  Neurological: denies dizziness, seizures, syncope, weakness, light-headedness, numbness and headaches.   Hematological: denies adenopathy, easy bruising, personal or family bleeding history.  Psychiatric/ Behavioral: denies suicidal ideation, mood changes, confusion, nervousness, sleep disturbance and agitation.       All other systems are reviewed and negative.    PHYSICAL EXAM: VS:  BP 90/62 mmHg  Pulse 50  Ht 5\' 10"  (1.778 m)  Wt 84.823 kg (187 lb)  BMI 26.83 kg/m2 , BMI Body mass index is 26.83 kg/(m^2). GEN: Well nourished, well developed, in no acute distress HEENT: normal Neck: no JVD, carotid bruits, or masses Cardiac: ; no murmurs, rubs, or gallops,no edema  Respiratory:  clear to auscultation bilaterally, normal work of breathing GI: soft, nontender, nondistended, + BS MS: no deformity or atrophy Skin: warm and dry, no rash Neuro:  Strength and sensation are intact Psych: normal   EKG:  EKG is not ordered today.    Recent Labs: No results found for requested labs within last 365 days.    Lipid Panel    Component Value Date/Time   CHOL 180 08/28/2011 1141   TRIG 165.0* 08/28/2011 1141   HDL 42.90 08/28/2011 1141   CHOLHDL 4 08/28/2011 1141   VLDL 33.0 08/28/2011 1141   LDLCALC 104* 08/28/2011 1141      Wt Readings from Last 3 Encounters:    09/28/14 84.823 kg (187 lb)  07/15/14 86.41 kg (190 lb 8 oz)  04/01/14 83.008 kg (183 lb)      Other studies Reviewed: Additional studies/ records that were reviewed today include: . Review of the above records demonstrates:    ASSESSMENT AND PLAN:  1 . Atrial fibrillation- his rate  is very well-controlled. Continue current medications. 2. Hyperlipidemia 3. Premature ventricular contractions 4. Hypothyroidism3 5.  Chest tightness/dyspnea. Patient continues to have exercise induced shortness of breath and some chest tightness. His wife comments more about this than he does. I think that he is developing some dementia. We have suggested a stress Myoview study in the past. We'll going to schedule him for a [employer] study. He now agrees toteh test   Current medicines are reviewed at length with the patient today.  The patient does not have concerns regarding medicines.  The following changes have been made:  no change  Labs/ tests ordered today include:  No orders of the defined types were placed in this encounter.     Disposition:   FU with me in 6 months      Sueo Cullen, Wonda Cheng, MD  09/28/2014 10:00 AM    [facility] [address], [place], [place]  [address] Phone: [phone]; Fax: [phone]   [facility]  [address] [place], [place]  [address] [phone]    Fax [phone]

## 2014-09-28 NOTE — Patient Instructions (Signed)
Medication Instructions:  Your physician recommends that you continue on your current medications as directed. Please refer to the Current Medication list given to you today.   Labwork: None Ordered  Testing/Procedures: Your physician has requested that you have a lexiscan myoview. For further information please visit [name]. Please follow instruction sheet, as given.   Follow-Up: Your physician wants you to follow-up in: 6 months with Dr. Acie Fredrickson.  You will receive a reminder letter in the mail two months in advance. If you don't receive a letter, please call our office to schedule the follow-up appointment.

## 2014-10-14 DIAGNOSIS — M7989 Other specified soft tissue disorders: Secondary | ICD-10-CM | POA: Diagnosis not present

## 2014-10-14 DIAGNOSIS — L0390 Cellulitis, unspecified: Secondary | ICD-10-CM | POA: Diagnosis not present

## 2014-10-14 DIAGNOSIS — M810 Age-related osteoporosis without current pathological fracture: Secondary | ICD-10-CM | POA: Diagnosis not present

## 2014-10-14 DIAGNOSIS — M1811 Unilateral primary osteoarthritis of first carpometacarpal joint, right hand: Secondary | ICD-10-CM | POA: Diagnosis not present

## 2014-10-15 DIAGNOSIS — M1811 Unilateral primary osteoarthritis of first carpometacarpal joint, right hand: Secondary | ICD-10-CM | POA: Diagnosis not present

## 2014-10-15 DIAGNOSIS — I10 Essential (primary) hypertension: Secondary | ICD-10-CM | POA: Diagnosis not present

## 2014-10-15 DIAGNOSIS — M79641 Pain in right hand: Secondary | ICD-10-CM | POA: Diagnosis not present

## 2014-10-15 DIAGNOSIS — Z85038 Personal history of other malignant neoplasm of large intestine: Secondary | ICD-10-CM | POA: Diagnosis not present

## 2014-10-15 DIAGNOSIS — Z79899 Other long term (current) drug therapy: Secondary | ICD-10-CM | POA: Diagnosis not present

## 2014-10-15 DIAGNOSIS — M19041 Primary osteoarthritis, right hand: Secondary | ICD-10-CM | POA: Diagnosis not present

## 2014-10-16 DIAGNOSIS — M79641 Pain in right hand: Secondary | ICD-10-CM | POA: Diagnosis not present

## 2014-10-16 DIAGNOSIS — I10 Essential (primary) hypertension: Secondary | ICD-10-CM | POA: Diagnosis not present

## 2014-10-16 DIAGNOSIS — Z85038 Personal history of other malignant neoplasm of large intestine: Secondary | ICD-10-CM | POA: Diagnosis not present

## 2014-10-16 DIAGNOSIS — Z87891 Personal history of nicotine dependence: Secondary | ICD-10-CM | POA: Diagnosis not present

## 2014-10-16 DIAGNOSIS — Z79899 Other long term (current) drug therapy: Secondary | ICD-10-CM | POA: Diagnosis not present

## 2014-10-21 ENCOUNTER — Encounter: Payer: Medicare Other | Admitting: Family Medicine

## 2014-10-27 ENCOUNTER — Ambulatory Visit (INDEPENDENT_AMBULATORY_CARE_PROVIDER_SITE_OTHER): Payer: Medicare Other | Admitting: *Deleted

## 2014-10-27 DIAGNOSIS — Z5181 Encounter for therapeutic drug level monitoring: Secondary | ICD-10-CM

## 2014-10-27 DIAGNOSIS — I4891 Unspecified atrial fibrillation: Secondary | ICD-10-CM

## 2014-10-27 LAB — POCT INR: INR: 2.1

## 2014-10-28 ENCOUNTER — Telehealth (HOSPITAL_COMMUNITY): Payer: Self-pay | Admitting: *Deleted

## 2014-10-28 ENCOUNTER — Telehealth (HOSPITAL_COMMUNITY): Payer: Self-pay | Admitting: Radiology

## 2014-10-28 NOTE — Telephone Encounter (Signed)
Left message on voicemail in reference to upcoming appointment scheduled for 11/02/14. Phone number given for a call back so details instructions can be given. Hubbard Robinson, RN

## 2014-10-28 NOTE — Telephone Encounter (Signed)
Patient given detailed instructions per Myocardial Perfusion Study Information Sheet for test on 11/02/2014 at 9:30. Patient Notified to arrive 15 minutes early, and that it is imperative to arrive on time for appointment to keep from having the test rescheduled. Patient verbalized understanding. Hampton Abbot, CNMT

## 2014-10-29 ENCOUNTER — Other Ambulatory Visit: Payer: Self-pay | Admitting: Family Medicine

## 2014-10-29 DIAGNOSIS — E079 Disorder of thyroid, unspecified: Secondary | ICD-10-CM

## 2014-10-29 DIAGNOSIS — R413 Other amnesia: Secondary | ICD-10-CM

## 2014-10-29 DIAGNOSIS — E785 Hyperlipidemia, unspecified: Secondary | ICD-10-CM

## 2014-10-30 ENCOUNTER — Other Ambulatory Visit (INDEPENDENT_AMBULATORY_CARE_PROVIDER_SITE_OTHER): Payer: Medicare Other

## 2014-10-30 DIAGNOSIS — E079 Disorder of thyroid, unspecified: Secondary | ICD-10-CM

## 2014-10-30 DIAGNOSIS — E785 Hyperlipidemia, unspecified: Secondary | ICD-10-CM

## 2014-10-30 DIAGNOSIS — R413 Other amnesia: Secondary | ICD-10-CM | POA: Diagnosis not present

## 2014-10-30 LAB — COMPREHENSIVE METABOLIC PANEL
ALK PHOS: 61 U/L (ref 39–117)
ALT: 18 U/L (ref 0–53)
AST: 20 U/L (ref 0–37)
Albumin: 4 g/dL (ref 3.5–5.2)
BILIRUBIN TOTAL: 0.9 mg/dL (ref 0.2–1.2)
BUN: 15 mg/dL (ref 6–23)
CO2: 29 meq/L (ref 19–32)
CREATININE: 0.78 mg/dL (ref 0.40–1.50)
Calcium: 9.9 mg/dL (ref 8.4–10.5)
Chloride: 102 mEq/L (ref 96–112)
GFR: 101.8 mL/min (ref >60.00)
Glucose, Bld: 100 mg/dL — ABNORMAL HIGH (ref 70–99)
Potassium: 4 mEq/L (ref 3.5–5.1)
Sodium: 136 mEq/L (ref 135–145)
TOTAL PROTEIN: 6.8 g/dL (ref 6.0–8.3)

## 2014-10-30 LAB — LIPID PANEL
CHOL/HDL RATIO: 3
Cholesterol: 143 mg/dL (ref 0–200)
HDL: 42.4 mg/dL (ref >39.00)
LDL Cholesterol: 72 mg/dL (ref 0–99)
NONHDL: 100.6
Triglycerides: 141 mg/dL (ref 0.0–149.0)
VLDL: 28.2 mg/dL (ref 0.0–40.0)

## 2014-10-30 LAB — VITAMIN B12: VITAMIN B 12: 344 pg/mL (ref 211–911)

## 2014-10-30 LAB — TSH: TSH: 2.49 u[IU]/mL (ref 0.35–4.50)

## 2014-11-02 ENCOUNTER — Ambulatory Visit (HOSPITAL_COMMUNITY): Payer: Medicare Other | Attending: Cardiology

## 2014-11-02 DIAGNOSIS — R0789 Other chest pain: Secondary | ICD-10-CM | POA: Diagnosis not present

## 2014-11-02 LAB — MYOCARDIAL PERFUSION IMAGING
CHL CUP NUCLEAR SDS: 1
CHL CUP RESTING HR STRESS: 52 {beats}/min
CSEPPHR: 67 {beats}/min
NUC STRESS TID: 1
RATE: 0.27
SRS: 1
SSS: 2

## 2014-11-02 MED ORDER — REGADENOSON 0.4 MG/5ML IV SOLN
0.4000 mg | Freq: Once | INTRAVENOUS | Status: AC
  Administered 2014-11-02: 0.4 mg via INTRAVENOUS

## 2014-11-02 MED ORDER — TECHNETIUM TC 99M SESTAMIBI GENERIC - CARDIOLITE
11.0000 | Freq: Once | INTRAVENOUS | Status: AC | PRN
  Administered 2014-11-02: 11 via INTRAVENOUS

## 2014-11-02 MED ORDER — TECHNETIUM TC 99M SESTAMIBI GENERIC - CARDIOLITE
33.0000 | Freq: Once | INTRAVENOUS | Status: AC | PRN
  Administered 2014-11-02: 33 via INTRAVENOUS

## 2014-11-03 ENCOUNTER — Encounter: Payer: Medicare Other | Admitting: Family Medicine

## 2014-11-04 ENCOUNTER — Encounter: Payer: Self-pay | Admitting: Family Medicine

## 2014-11-04 ENCOUNTER — Ambulatory Visit (INDEPENDENT_AMBULATORY_CARE_PROVIDER_SITE_OTHER): Payer: Medicare Other | Admitting: Family Medicine

## 2014-11-04 VITALS — BP 126/78 | HR 54 | Temp 97.5°F | Ht 70.0 in | Wt 182.8 lb

## 2014-11-04 DIAGNOSIS — M25511 Pain in right shoulder: Secondary | ICD-10-CM

## 2014-11-04 DIAGNOSIS — M7501 Adhesive capsulitis of right shoulder: Secondary | ICD-10-CM | POA: Diagnosis not present

## 2014-11-04 MED ORDER — METHYLPREDNISOLONE ACETATE 40 MG/ML IJ SUSP
80.0000 mg | Freq: Once | INTRAMUSCULAR | Status: AC
  Administered 2014-11-04: 80 mg via INTRA_ARTICULAR

## 2014-11-04 NOTE — Progress Notes (Signed)
Pre visit review using our clinic review tool, if applicable. No additional management support is needed unless otherwise documented below in the visit note. 

## 2014-11-04 NOTE — Progress Notes (Signed)
Dr. Frederico Hamman T. Hades Mathew, MD, [facility] [place], [address] Phone: [phone] Fax: [phone]  11/04/2014  Patient: Christian Maxwell, MRN: [id number], DOB: Dec 13, 1932, 81 y.o.  Primary Physician:  Ria Bush, MD  Chief Complaint: wants injection in right shoulder  Subjective:   Christian Maxwell is a 81 y.o. very pleasant male patient who presents with the following: shoulder pain  R frozen shoulder. Frozen shoulder follow-up. I remember talking by this patient quite well and going over frozen shoulder with the patient a few months ago, but the patient seems to have minimal understanding of what was said to him. He and his wife seem did not fully recall what was said about his shoulder.  He is sad of slowly progressive frozen shoulder that has been ongoing now for 6 months. He declined formal physical therapy at his last office visit. I did a combined subacromial and intra-articular injection. This did help some with his pain. He still is worsening at this point.  07/15/2014 Last OV with Owens Loffler, MD  The patient noted above presents with shoulder pain that has been ongoing for 3 mo there is no history of trauma or accident. The patient denies neck pain or radicular symptoms. No shoulder blade pain Denies dislocation, subluxation, separation of the shoulder. The patient does complain of pain with flexion, abduction, and terminal motion.  Significant restriction of motion. he describes a deep ache around the shoulder, and sometimes it will wake the patient up at night.  Medications Tried: tylenol Ice or Heat: minimal help Tried PT: No  Prior shoulder Injury: No Prior surgery: No Prior fracture: No  Past Medical History, Surgical History, Social History, Family History, Medications, and allergies reviewed and updated if relevant.   GEN: No fevers, chills. Nontoxic. Primarily MSK c/o today. MSK: Detailed in  the HPI GI: tolerating PO intake without difficulty Neuro: No numbness, parasthesias, or tingling associated. Otherwise the pertinent positives of the ROS are noted above.    Objective:   Blood pressure 126/78, pulse 54, temperature 97.5 F (36.4 C), temperature source Oral, height 5\' 10"  (1.778 m), weight 182 lb 12 oz (82.895 kg), SpO2 97 %.  GEN: Well-developed,well-nourished,in no acute distress; alert,appropriate and cooperative throughout examination HEENT: Normocephalic and atraumatic without obvious abnormalities. Ears, externally no deformities PULM: Breathing comfortably in no respiratory distress EXT: No clubbing, cyanosis, or edema PSYCH: Normally interactive. Cooperative during the interview. Pleasant. Friendly and conversant. Not anxious or depressed appearing. Normal, full affect.  Shoulder: R Inspection: No muscle wasting or winging Ecchymosis/edema: neg  AC joint, scapula, clavicle: NT Cervical spine: NT, full ROM Spurling's: neg Abduction: 4+/5, to 115 Flexion: 5/5, 130 IR, full, lift-off: 5/5, minimal ER at neutral: 5/5, 70 AC crossover and compression: unable to complete Additional special testing is equivocal given lack of motion C5-T1 intact Sensation intact Grip 5/5  Assessment and Plan:   Capsulitis, adhesive shoulder, right [M75.01]  Pain in joint, shoulder region, right - Plan: methylPREDNISolone acetate (DEPO-MEDROL) injection 80 mg  >25 minutes spent in face to face time with patient, >50% spent in counselling or coordination of care  adhesive capsulitis  Patient was given a systematic ROM protocol from [employer] to be done daily. Emphasized adherence and recommended formal PT.  Tylenol or NSAID of choice prn for pain relief  Patient will be sent for formal PT for aggressive frozen shoulder ROM: he declined my recommendation. Will need RTC str and scapular  stabilization to fix underlying mechanics.  Intraarticular corticosteroid injections in  Adhesive Capsulitis have clearly been shown to be of benefit.   Intrarticular Shoulder Injection, R Verbal consent was obtained from the patient. Risks including infection explained and contrasted with benefits and alternatives. Patient prepped with Chloraprep and Ethyl Chloride used for anesthesia. An intraarticular shoulder injection was performed using the posterior approach. The patient tolerated the procedure well and had decreased pain post injection. No complications. Injection: 8 cc of Lidocaine 1% and Depo-Medrol 80 mg. Needle: 21 gauge, 2 inch  Signed,  Marley Pakula T. Kedron Uno, MD   Patient's Medications  New Prescriptions   No medications on file  Previous Medications   ASPIRIN 81 MG TABLET    Take 162 mg by mouth daily.    CALCIUM PO    Take 1 tablet by mouth daily.    DONEPEZIL (ARICEPT) 10 MG TABLET    Take 10 mg by mouth at bedtime.   FISH OIL-OMEGA-3 FATTY ACIDS 1000 MG CAPSULE    Take 1 g by mouth daily.    FOLIC ACID (FOLVITE) 1 MG TABLET    Take 1 mg by mouth daily.   METOPROLOL TARTRATE (LOPRESSOR) 25 MG TABLET    Take 1 tablet (25 mg total) by mouth 2 (two) times daily.   NITROGLYCERIN (NITROSTAT) 0.4 MG SL TABLET    Place 1 tablet (0.4 mg total) under the tongue every 5 (five) minutes as needed for chest pain.   SIMVASTATIN (ZOCOR) 40 MG TABLET    TAKE 1 TABLET BY MOUTH EVERY MORNING   THIAMINE HCL (VITAMIN B-1 PO)    Take 1 tablet by mouth daily.    WARFARIN (COUMADIN) 5 MG TABLET    TAKE AS DIRECTED  Modified Medications   No medications on file  Discontinued Medications   No medications on file

## 2014-11-06 ENCOUNTER — Encounter: Payer: Self-pay | Admitting: Family Medicine

## 2014-11-06 ENCOUNTER — Ambulatory Visit (INDEPENDENT_AMBULATORY_CARE_PROVIDER_SITE_OTHER): Payer: Medicare Other | Admitting: Family Medicine

## 2014-11-06 VITALS — BP 124/72 | HR 72 | Temp 97.8°F | Ht 67.5 in | Wt 179.2 lb

## 2014-11-06 DIAGNOSIS — Z0000 Encounter for general adult medical examination without abnormal findings: Secondary | ICD-10-CM | POA: Diagnosis not present

## 2014-11-06 DIAGNOSIS — E785 Hyperlipidemia, unspecified: Secondary | ICD-10-CM

## 2014-11-06 DIAGNOSIS — R413 Other amnesia: Secondary | ICD-10-CM

## 2014-11-06 DIAGNOSIS — Z7189 Other specified counseling: Secondary | ICD-10-CM | POA: Insufficient documentation

## 2014-11-06 DIAGNOSIS — I4891 Unspecified atrial fibrillation: Secondary | ICD-10-CM

## 2014-11-06 DIAGNOSIS — D126 Benign neoplasm of colon, unspecified: Secondary | ICD-10-CM

## 2014-11-06 NOTE — Progress Notes (Signed)
Pre visit review using our clinic review tool, if applicable. No additional management support is needed unless otherwise documented below in the visit note. 

## 2014-11-06 NOTE — Assessment & Plan Note (Signed)
Advanced directive discussion - has at home. Wife is HCPOA. Will bring me copy

## 2014-11-06 NOTE — Assessment & Plan Note (Signed)
Overall stable on testing today. Continue aricept. Encouraged continued reading. B12 normal.

## 2014-11-06 NOTE — Progress Notes (Signed)
BP 124/72 mmHg  Pulse 72  Temp(Src) 97.8 F (36.6 C) (Oral)  Ht 5' 7.5" (1.715 m)  Wt 179 lb 4 oz (81.307 kg)  BMI 27.64 kg/m2  SpO2 97%   CC: medicare wellness visit  Subjective:    Patient ID: Christian Maxwell, male    DOB: December 07, 1932, 81 y.o.   MRN: [id number]  HPI: Christian Maxwell is a 81 y.o. male presenting on 11/06/2014 for Medicare Wellness   Somewhat restless today - dropped wife off at ER 1.5 hrs ago after she fell and hit her head with scalp laceration.  Seen by Dr Lorelei Pont 6/22 for adhesive capsulitis.  Saw cardiologist - normal lexiscan myoview 10/2014  Hearing screen - failed at high frequencies. Denies trouble.  Vision screen - sees eye doctor regularly Fall risk screen - passed Depression screen - passed  Preventative: COLONOSCOPY Date: 2012 per patient (Dr. [name]) Prostate cancer screening - aged out Flu shot - yearly Tetanus shot - declines Pneumonia shot - declines Shingles shot - may have received? Doesn't remember Advanced directive discussion - has at home. Wife is HCPOA. Will bring me copy. Seat belt use discussed Sunscreen use and skin screen discussed  Caffeine: none Lives with wife (28yrs). Grown daughters x2 Occupation: retired, had Human resources officer Activity: stationary bike twice daily Diet: fruits/vegetables daily, good water  Relevant past medical, surgical, family and social history reviewed and updated as indicated. Interim medical history since our last visit reviewed. Allergies and medications reviewed and updated. Current Outpatient Prescriptions on File Prior to Visit  Medication Sig  . aspirin 81 MG tablet Take 162 mg by mouth daily.   [name] CALCIUM PO Take 1 tablet by mouth daily.   [name] donepezil (ARICEPT) 10 MG tablet Take 10 mg by mouth at bedtime.  . fish oil-omega-3 fatty acids 1000 MG capsule Take 1 g by mouth daily.   . folic acid (FOLVITE) 1 MG tablet Take 1 mg by mouth daily.  . metoprolol tartrate (LOPRESSOR) 25 MG tablet  Take 1 tablet (25 mg total) by mouth 2 (two) times daily.  . nitroGLYCERIN (NITROSTAT) 0.4 MG SL tablet Place 1 tablet (0.4 mg total) under the tongue every 5 (five) minutes as needed for chest pain.  . simvastatin (ZOCOR) 40 MG tablet TAKE 1 TABLET BY MOUTH EVERY MORNING  . Thiamine HCl (VITAMIN B-1 PO) Take 1 tablet by mouth daily.   [name] warfarin (COUMADIN) 5 MG tablet TAKE AS DIRECTED   No current facility-administered medications on file prior to visit.    Review of Systems Per HPI unless specifically indicated above     Objective:    BP 124/72 mmHg  Pulse 72  Temp(Src) 97.8 F (36.6 C) (Oral)  Ht 5' 7.5" (1.715 m)  Wt 179 lb 4 oz (81.307 kg)  BMI 27.64 kg/m2  SpO2 97%  Wt Readings from Last 3 Encounters:  11/06/14 179 lb 4 oz (81.307 kg)  11/04/14 182 lb 12 oz (82.895 kg)  11/02/14 185 lb (83.915 kg)    Physical Exam  Constitutional: He is oriented to person, place, and time. He appears well-developed and well-nourished. No distress.  HENT:  Head: Normocephalic and atraumatic.  Right Ear: Hearing, tympanic membrane, external ear and ear canal normal.  Left Ear: Hearing, tympanic membrane, external ear and ear canal normal.  Nose: Nose normal.  Mouth/Throat: Uvula is midline, oropharynx is clear and moist and mucous membranes are normal. No oropharyngeal exudate, posterior oropharyngeal edema or posterior oropharyngeal erythema.  Eyes: Conjunctivae and EOM are normal. Pupils are equal, round, and reactive to light. No scleral icterus.  Neck: Normal range of motion. Neck supple. Carotid bruit is not present. No thyromegaly present.  Cardiovascular: Normal rate, normal heart sounds and intact distal pulses.  An irregularly irregular rhythm present.  No murmur heard. Pulses:      Radial pulses are 2+ on the right side, and 2+ on the left side.  Pulmonary/Chest: Effort normal and breath sounds normal. No respiratory distress. He has no wheezes. He has no rales.  Abdominal:  Soft. Bowel sounds are normal. He exhibits no distension and no mass. There is no tenderness. There is no rebound and no guarding.  Musculoskeletal: Normal range of motion. He exhibits no edema.  Lymphadenopathy:    He has no cervical adenopathy.  Neurological: He is alert and oriented to person, place, and time.  CN grossly intact, station and gait intact Recall 1/3, 3/3 with cue Calculation 5/5 serial 3s  Skin: Skin is warm and dry. No rash noted.  Psychiatric: He has a normal mood and affect. His behavior is normal. Judgment and thought content normal.  Nursing note and vitals reviewed.  Results for orders placed or performed in visit on 11/02/14  Myocardial Perfusion Imaging  Result Value Ref Range   Rest HR 52 bpm   Rest BP 73/54 mmHg   Exercise duration (min)  min   Exercise duration (sec)  sec   Estimated workload  METS   Post peak HR 67 bpm   Post peak BP 101/44 mmHg   MPHR  bpm   Percent HR  %   RPE     LV Systolic Volume  mL   TID 1.00    Rate 0.27    SSS 2    SRS 1    SDS 1    LV Diastolic Volume  mL      Assessment & Plan:   Problem List Items Addressed This Visit    Adenomatous colon polyp    I have asked him to sign release for records from last colonoscopy with Dr [name]      Advanced care planning/counseling discussion    Advanced directive discussion - has at home. Wife is HCPOA. Will bring me copy      Atrial fibrillation    Continue cards f/u. Continue aspirin and warfarin daily.      Hyperlipidemia    Chronic, stable. Continue fish oil and simvastatin.      Medicare annual wellness visit, initial - Primary    I have personally reviewed the Medicare Annual Wellness questionnaire and have noted 1. The patient's medical and social history 2. Their use of alcohol, tobacco or illicit drugs 3. Their current medications and supplements 4. The patient's functional ability including ADL's, fall risks, home safety risks and hearing or visual  impairment. Cognitive function has been assessed and addressed as indicated.  5. Diet and physical activity 6. Evidence for depression or mood disorders The patients weight, height, BMI have been recorded in the chart. I have made referrals, counseling and provided education to the patient based on review of the above and I have provided the pt with a written personalized care plan for preventive services. Provider list updated.. See scanned questionairre as needed for further documentation. Reviewed preventative protocols and updated unless pt declined.       Memory impairment    Overall stable on testing today. Continue aricept. Encouraged continued reading. B12 normal.  Follow up plan: Return in about 1 year (around 11/06/2015), or as needed, for medicare wellness.

## 2014-11-06 NOTE — Assessment & Plan Note (Signed)
Chronic, stable. Continue fish oil and simvastatin.

## 2014-11-06 NOTE — Assessment & Plan Note (Addendum)
Continue cards f/u. Continue aspirin and warfarin daily.

## 2014-11-06 NOTE — Assessment & Plan Note (Signed)
I have asked him to sign release for records from last colonoscopy with Dr [name]

## 2014-11-06 NOTE — Assessment & Plan Note (Signed)

## 2014-11-06 NOTE — Patient Instructions (Addendum)
[facility] is off [address] off [address] (right) Bring me a copy of your advanced directive at home. Blood work looking good today. Return as needed or in 1 year for next medicare wellness visit. Pass by up front to sign release of information for colonoscopy from Dr [name]

## 2014-11-22 IMAGING — CR DG KNEE COMPLETE 4+V*L*
5 series · 5 of 5 positions shown · non-contrast
Comparison: None.

CLINICAL DATA: Bilateral knee pain

EXAM:
LEFT KNEE - COMPLETE 4+ VIEW

[view not recorded (1 of 5)]
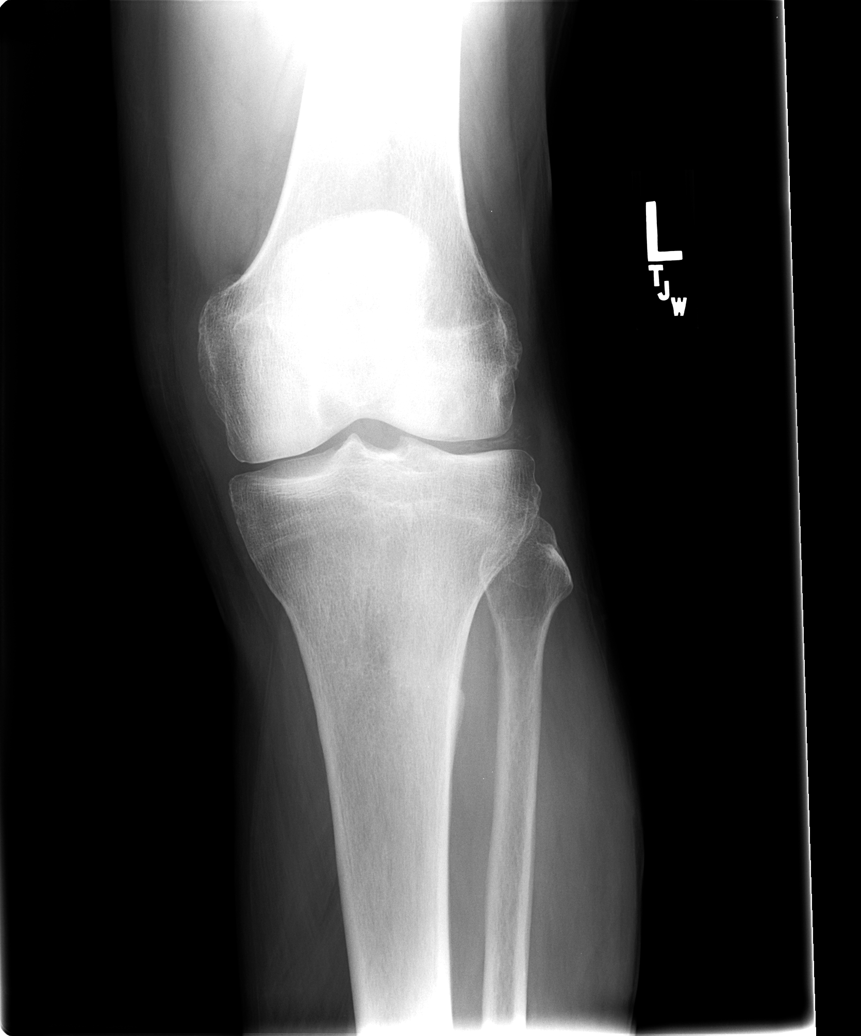

[view not recorded (2 of 5)]
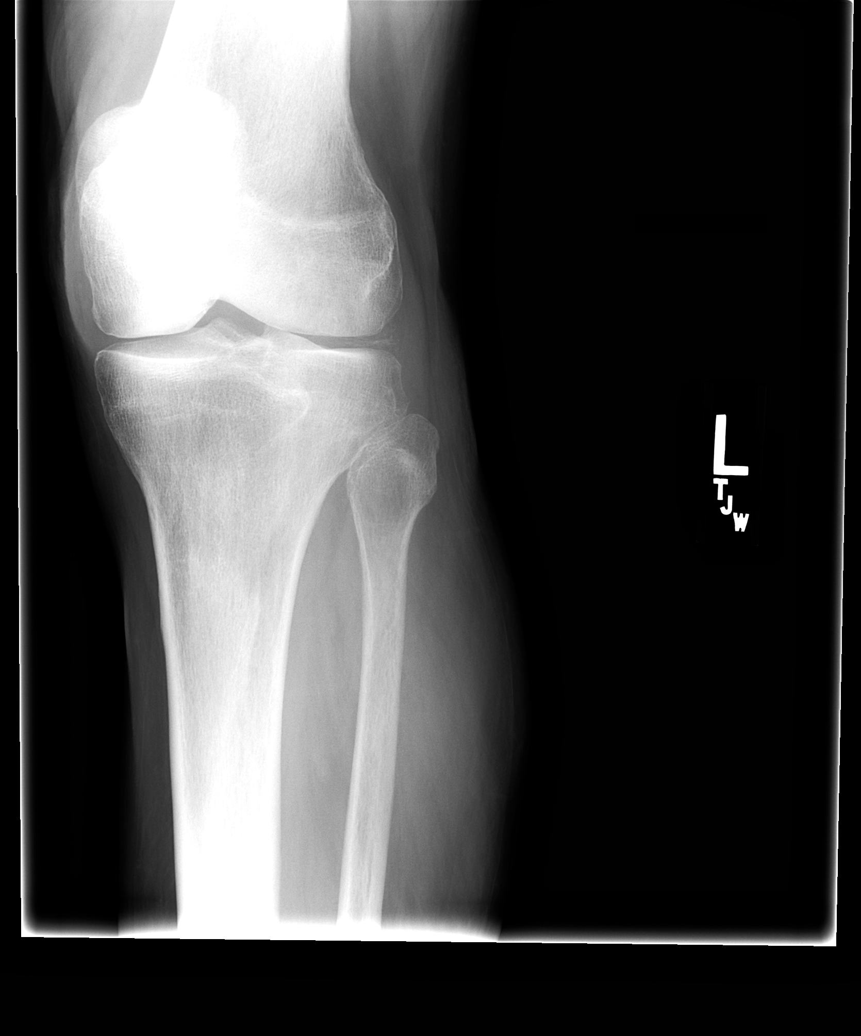

[view not recorded (3 of 5)]
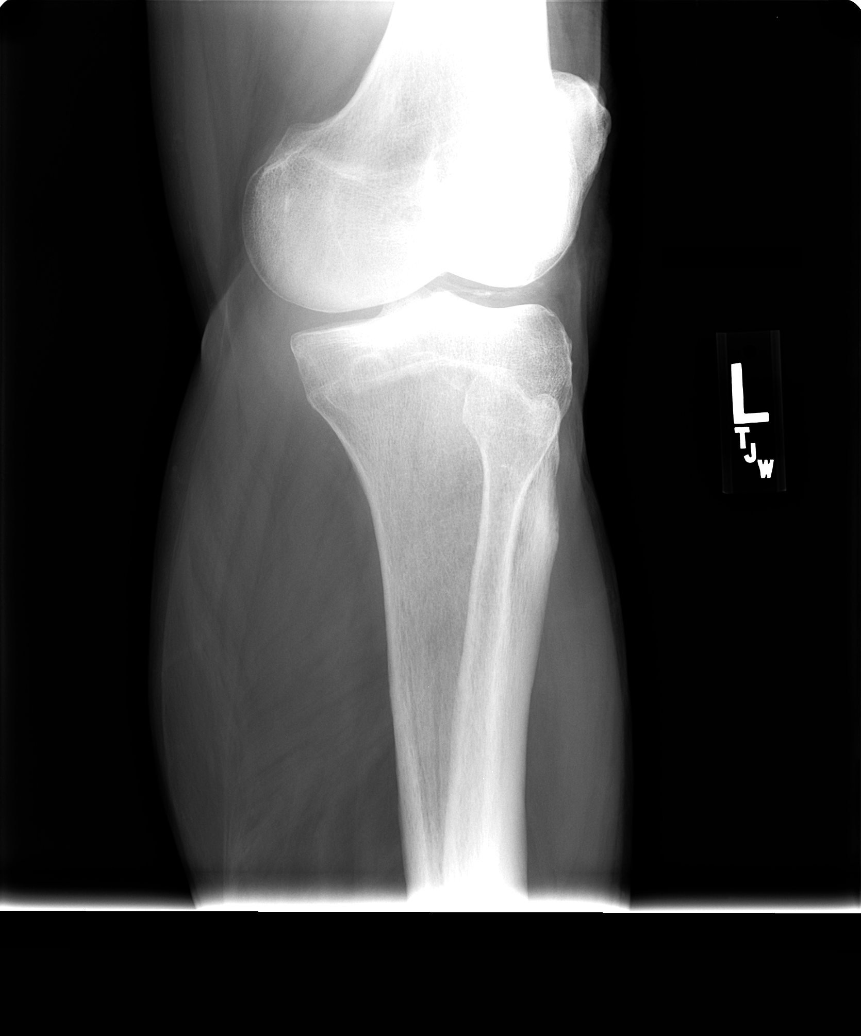

[view not recorded (4 of 5)]
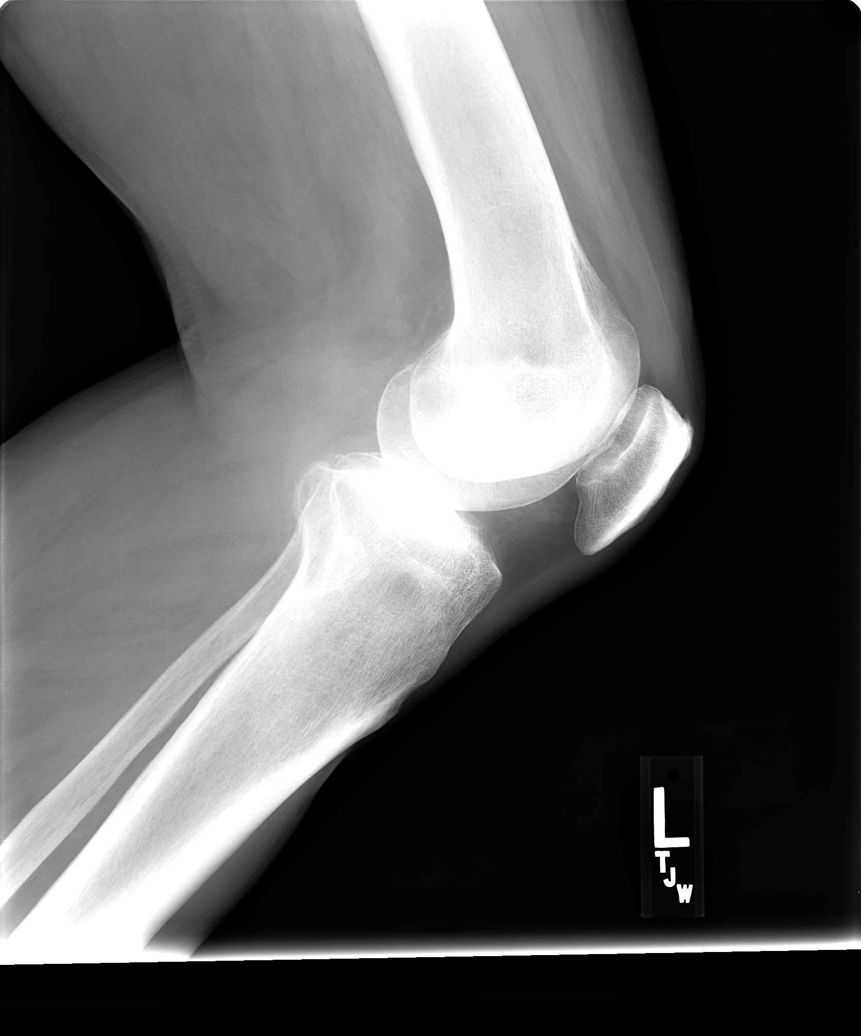

[view not recorded (5 of 5)]
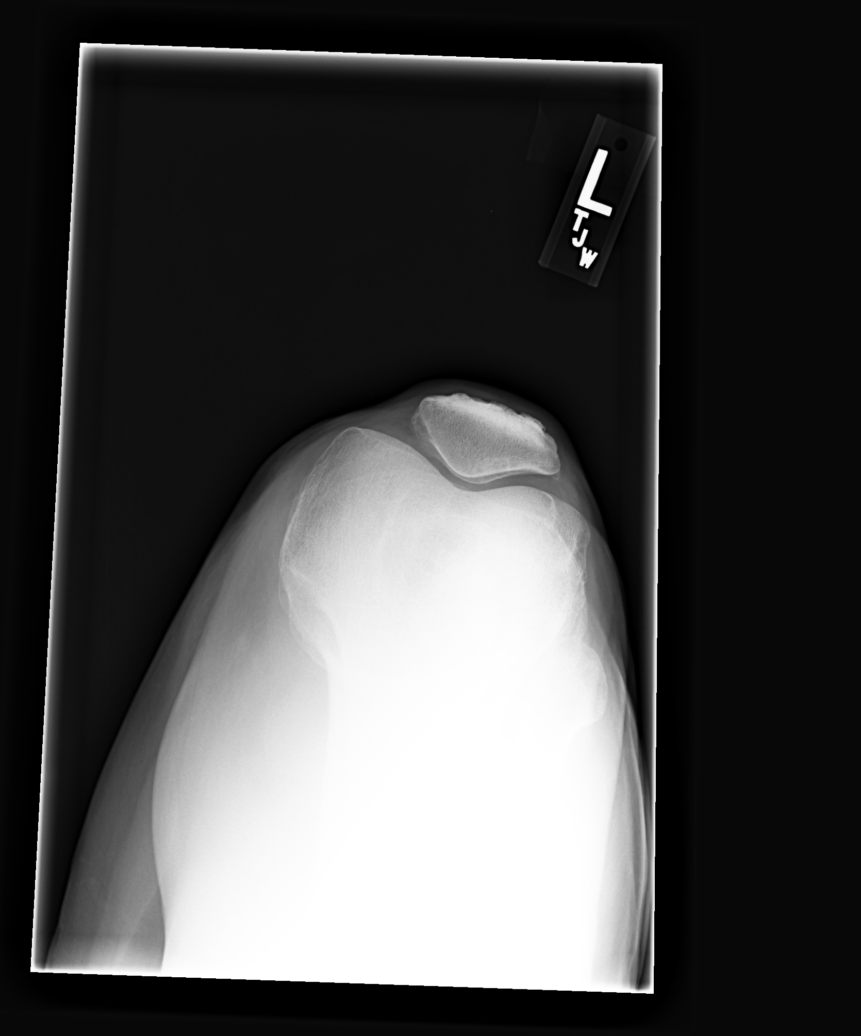

[5 of 5 positions shown; findings below may reference images not displayed]

FINDINGS: There is mild tricompartmental degenerative joint disease of the
left knee for age. There is some loss of joint space medially,
laterally, and at the patella femoral articulation with sclerosis.
There is chondrocalcinosis present which may indicate CPPD. No joint
effusion is seen.
IMPRESSION: Mild tricompartmental degenerative joint disease. Chondrocalcinosis.
Question CPPD.

## 2014-11-23 ENCOUNTER — Other Ambulatory Visit: Payer: Self-pay | Admitting: Cardiovascular Disease

## 2014-11-25 ENCOUNTER — Ambulatory Visit: Payer: Medicare Other | Admitting: Family Medicine

## 2014-11-25 ENCOUNTER — Ambulatory Visit (INDEPENDENT_AMBULATORY_CARE_PROVIDER_SITE_OTHER): Payer: Medicare Other | Admitting: Family Medicine

## 2014-11-25 ENCOUNTER — Encounter: Payer: Self-pay | Admitting: Family Medicine

## 2014-11-25 VITALS — BP 100/60 | HR 75 | Temp 97.5°F | Ht 67.5 in | Wt 177.8 lb

## 2014-11-25 DIAGNOSIS — L02211 Cutaneous abscess of abdominal wall: Secondary | ICD-10-CM | POA: Diagnosis not present

## 2014-11-25 DIAGNOSIS — M545 Low back pain, unspecified: Secondary | ICD-10-CM

## 2014-11-25 MED ORDER — DEXAMETHASONE SODIUM PHOSPHATE 10 MG/ML IJ SOLN
10.0000 mg | Freq: Once | INTRAMUSCULAR | Status: AC
  Administered 2014-11-25: 10 mg via INTRAMUSCULAR

## 2014-11-25 MED ORDER — TIZANIDINE HCL 2 MG PO CAPS: 2.0000 mg | ORAL_CAPSULE | Freq: Every evening | ORAL | Status: DC | PRN

## 2014-11-25 NOTE — Progress Notes (Signed)
Dr. Frederico Hamman T. Kiosha Buchan, MD, [facility] [place], 04/22/1933 Phone: [phone] Fax: [phone]  11/25/2014  Patient: Christian Maxwell, MRN: [id number], DOB: 1932/10/04, 81 y.o.  Primary Physician:  Ria Bush, MD  Chief Complaint: Back Pain  Subjective:   Christian Maxwell is a 81 y.o. very pleasant male patient who presents with the following:  Having a lot of back pain in low back and not radiating.  Has been ongoing now for a month.  No falls or accidents.  Essentially his back just started to slowly get worse and have some pain over the last month, and he has not any traumatic injury at all that he can recall.  He is not having any bowel or bladder incontinence, and he is not having any saddle anesthesia.  He is not describing any form of radiculopathy at all.  No weakness, numbness, or tingling sensations distally.  1977, lumbar laminectomy.   Heating pad will help a little bit.  Rides bike most days still.     Past Medical History, Surgical History, Social History, Family History, Problem List, Medications, and Allergies have been reviewed and updated if relevant.  Patient Active Problem List   Diagnosis Date Noted  . Advanced care planning/counseling discussion 11/06/2014  . Medicare annual wellness visit, initial 11/06/2014  . Bilateral knee pain 03/17/2014  . DOE (dyspnea on exertion) 02/16/2014  . Encounter for therapeutic drug monitoring 06/17/2013  . Memory impairment 03/06/2013  . MRSA colonization 12/12/2012  . Nevus of sternal region 12/04/2012  . Seasonal allergic rhinitis 10/22/2012  . Thyroid disease   . Suprapubic abscess 08/21/2012  . H/O colon cancer, stage I 06/24/2012  . Adenomatous colon polyp 06/24/2012  . Open wound-left arm and suprapubic area. 03/11/2012  . Postoperative stitch abscess 12/27/2011  . Chest discomfort 04/21/2011  . Hyperlipidemia 10/14/2010  . Atrial  fibrillation 08/08/2010  . INSOMNIA, MILD 12/12/2006    Past Medical History  Diagnosis Date  . Hyperlipidemia   . Syncope and collapse     pt denies  . Atrial fibrillation   . PVC's (premature ventricular contractions)   . Abscess 7/13    abd abscess  . History of colon cancer 2000    T3N0 s/p colectomy  . Normal nuclear stress test 2004  . METHICILLIN RESISTANT STAPHYLOCOCCUS AUREUS INFECTION 12/12/2006    Annotation: stitch abcess in lower abdomen after colon cancer resection Qualifier: Diagnosis of  By: [name] MD, [name]    . Postoperative stitch abscess 07/19/2011  . Hx of adenomatous colonic polyps   . Thyroid disease     followed by [name]  . Insomnia   . Skin abscess     recurrent  . Hypertension     pt states he does not have this. 02/26/13  . Myocardial infarction     at age 3 years  . Cancer     colon cancer 2000  . Arthritis     bursitis in left shoulder    Past Surgical History  Procedure Laterality Date  . Lumbar laminectomy  1977  . Wound exploration  2006    For possible stitch abscess Dr [name]  . Incise and drain abcess  2009,2010,2011,2012  . Colectomy  2000    6 inches removed  . Mass excision  03/07/2012    EXCISION MASS;  Surgeon: [name], MD;  Laterality: Right;  removal mass posterior right arm  . Polypectomy    .  Colonoscopy  2012    per patient (Dr. [name])  . Incision and drainage abscess N/A 09/20/2012    Procedure: INCISION AND DRAINAGE SUPRAPUBIC ABSCESS;  Surgeon: [name], MD;  Location: [facility];  Service: General;  Laterality: N/A;  . Hernia repair  12/1854    DB umbilical hernia  . Eye surgery      cataract surgery both eyes  . Incision and drainage abscess Left 02/28/2013    Procedure: INCISION AND DRAINAGE LEFT ARM ABSCESS;  Surgeon: [name], MD;  Location: [facility];  Service: General;  Laterality: Left;  . Incision and drainage Right 04/02/2013    Procedure: INCISION AND DRAINAGE  RIGHT KNEE HEMATOMA;  Surgeon: [name], MD;  Location: [facility];  Service: Orthopedics;  Laterality: Right;    History   Social History  . Marital Status: Married    Spouse Name: N/A  . Number of Children: N/A  . Years of Education: N/A   Occupational History  . Not on file.   Social History Main Topics  . Smoking status: Former Smoker    Quit date: 10/13/1977  . Smokeless tobacco: Former Systems developer    Types: Chew    Quit date: 10/13/1977  . Alcohol Use: No  . Drug Use: No  . Sexual Activity: Yes   Other Topics Concern  . Not on file   Social History Narrative   Caffeine: none   Lives with wife (86yrs).  Grown daughters x2   Occupation: retired, had Human resources officer   Activity: take care of 3 houses   Diet: fruits/vegetables daily, good water      Cards: Ecologist   Endo: [place]   Surg: [name]   GIDeatra Ina    Family History  Problem Relation Age of Onset  . Stroke Mother   . Stroke Father   . Hypertension Father   . CAD Father 25    MI  . Stroke Sister 82  . Stroke Brother   . Stroke Brother   . Diabetes Brother   . CAD Brother 57    MI  . Cancer Neg Hx   . Dementia Sister 39  . Dementia Father   . Dementia Brother     No Known Allergies  Medication list reviewed and updated in full in [facility].  GEN: No fevers, chills. Nontoxic. Primarily MSK c/o today. MSK: Detailed in the HPI GI: tolerating PO intake without difficulty Neuro: No numbness, parasthesias, or tingling associated. Otherwise the pertinent positives of the ROS are noted above.   Objective:   BP 100/60 mmHg  Pulse 75  Temp(Src) 97.5 F (36.4 C) (Oral)  Ht 5' 7.5" (1.715 m)  Wt 177 lb 12 oz (80.627 kg)  BMI 27.41 kg/m2   GEN: Well-developed,well-nourished,in no acute distress; alert,appropriate and cooperative throughout examination HEENT: Normocephalic and atraumatic without obvious abnormalities. Ears, externally no deformities PULM: Breathing comfortably in no  respiratory distress EXT: No clubbing, cyanosis, or edema PSYCH: Normally interactive. Cooperative during the interview. Pleasant. Friendly and conversant. Not anxious or depressed appearing. Normal, full affect.  Range of motion at  the waist: Flexion, extension, lateral bending and rotation: flexion to 65.  Normal extension.  Lateral bending and rotation are preserved.  No echymosis or edema Rises to examination table with mild difficulty Gait: minimally antalgic  Inspection/Deformity: N Paraspinus Tenderness: mild to moderate, L2 through S1  B Ankle Dorsiflexion (L5,4): 5/5 B Great Toe Dorsiflexion (L5,4): 5/5 Heel Walk (L5): WNL Toe  Walk (S1): WNL Rise/Squat (L4): WNL, mild pain  SENSORY B Medial Foot (L4): WNL B Dorsum (L5): WNL B Lateral (S1): WNL Light Touch: WNL Pinprick: WNL  REFLEXES Knee (L4): 2+ Ankle (S1): 2+  B SLR, seated: neg B SLR, supine: neg B FABER: neg B Reverse FABER: neg B Greater Troch: NT B Log Roll: neg B Stork: NT B Sciatic Notch: NT   Radiology: No results found.  Assessment and Plan:   Bilateral low back pain without sciatica - Plan: dexamethasone (DECADRON) injection 10 mg  On Coumadin - no NSAIDS Decadron 10 mg IM Zanaflex at night  Heat, massage, ROM A rehabilitation program from the [facility] was reviewed with the patient face to face for their condition.   Follow-up: prn  New Prescriptions   TIZANIDINE (ZANAFLEX) 2 MG CAPSULE    Take 1 capsule (2 mg total) by mouth at bedtime as needed for muscle spasms.    Signed,  Maud Deed. Shallon Yaklin, MD   Patient's Medications  New Prescriptions   TIZANIDINE (ZANAFLEX) 2 MG CAPSULE    Take 1 capsule (2 mg total) by mouth at bedtime as needed for muscle spasms.  Previous Medications   ASPIRIN 81 MG TABLET    Take 162 mg by mouth daily.    DONEPEZIL (ARICEPT) 10 MG TABLET    Take 10 mg by mouth at bedtime.   FISH OIL-OMEGA-3 FATTY ACIDS 1000 MG CAPSULE     Take 1 g by mouth daily.    FOLIC ACID (FOLVITE) 1 MG TABLET    Take 1 mg by mouth daily.   METOPROLOL TARTRATE (LOPRESSOR) 25 MG TABLET    TAKE ONE TABLET BY MOUTH TWICE DAILY   NITROGLYCERIN (NITROSTAT) 0.4 MG SL TABLET    Place 1 tablet (0.4 mg total) under the tongue every 5 (five) minutes as needed for chest pain.   SIMVASTATIN (ZOCOR) 40 MG TABLET    TAKE 1 TABLET BY MOUTH EVERY MORNING   THIAMINE HCL (VITAMIN B-1 PO)    Take 1 tablet by mouth daily.    WARFARIN (COUMADIN) 5 MG TABLET    TAKE AS DIRECTED  Modified Medications   No medications on file  Discontinued Medications   CALCIUM PO    Take 1 tablet by mouth daily.

## 2014-11-25 NOTE — Progress Notes (Signed)
Pre visit review using our clinic review tool, if applicable. No additional management support is needed unless otherwise documented below in the visit note. 

## 2014-12-02 ENCOUNTER — Encounter: Payer: Self-pay | Admitting: Gastroenterology

## 2014-12-08 ENCOUNTER — Ambulatory Visit (INDEPENDENT_AMBULATORY_CARE_PROVIDER_SITE_OTHER): Payer: Medicare Other

## 2014-12-08 DIAGNOSIS — Z5181 Encounter for therapeutic drug level monitoring: Secondary | ICD-10-CM | POA: Diagnosis not present

## 2014-12-08 DIAGNOSIS — I4891 Unspecified atrial fibrillation: Secondary | ICD-10-CM

## 2014-12-08 LAB — POCT INR: INR: 4.3

## 2014-12-15 ENCOUNTER — Other Ambulatory Visit: Payer: Self-pay | Admitting: Cardiovascular Disease

## 2014-12-16 ENCOUNTER — Encounter: Payer: Self-pay | Admitting: Family Medicine

## 2014-12-16 ENCOUNTER — Telehealth: Payer: Self-pay | Admitting: Family Medicine

## 2014-12-16 DIAGNOSIS — D126 Benign neoplasm of colon, unspecified: Secondary | ICD-10-CM

## 2014-12-16 DIAGNOSIS — Z85038 Personal history of other malignant neoplasm of large intestine: Secondary | ICD-10-CM

## 2014-12-16 NOTE — Telephone Encounter (Signed)
Spoke with patient and he stated that he is not going back to see Dr Danise Mina anymore and would like for Dr Acie Fredrickson to continue to refill this. He is aware that I will sent this in for him.

## 2014-12-16 NOTE — Telephone Encounter (Signed)
It looks like from the chart that the PCP planned to discuss lab work with him at office visit.  Will you ask patient if PCP is filling or does he need Dr. Acie Fredrickson to fill?  If Dr. Acie Fredrickson is to fill, can refill for 1 year.

## 2014-12-16 NOTE — Telephone Encounter (Signed)
Reviewing records - last colonoscopy 2009 with adenomatous polyp. He is due for f/u. Usually we stop colonoscopies at age 81 yo but given his h/o colon cancer, reasonable to repeat.

## 2014-12-16 NOTE — Telephone Encounter (Signed)
Lipids recently checked by pcp. Ok to refill?

## 2014-12-16 NOTE — Telephone Encounter (Signed)
Pt's wife called having questions about pt needing a colonoscopy. She stated that Dr. Herbert Spires office called saying he may be due for one and was asking them if Dr. Danise Mina was ordering him one.  The best number to contact pt's wife, [name], back at is [phone].

## 2014-12-17 NOTE — Telephone Encounter (Signed)
Patient notified and will await call to schedule. 

## 2014-12-22 ENCOUNTER — Ambulatory Visit (INDEPENDENT_AMBULATORY_CARE_PROVIDER_SITE_OTHER): Payer: Medicare Other | Admitting: *Deleted

## 2014-12-22 DIAGNOSIS — Z5181 Encounter for therapeutic drug level monitoring: Secondary | ICD-10-CM

## 2014-12-22 DIAGNOSIS — I4891 Unspecified atrial fibrillation: Secondary | ICD-10-CM

## 2014-12-22 LAB — POCT INR: INR: 2.4

## 2015-01-15 ENCOUNTER — Other Ambulatory Visit: Payer: Self-pay | Admitting: Cardiovascular Disease

## 2015-01-20 ENCOUNTER — Ambulatory Visit (INDEPENDENT_AMBULATORY_CARE_PROVIDER_SITE_OTHER): Payer: Medicare Other | Admitting: Family Medicine

## 2015-01-20 ENCOUNTER — Encounter: Payer: Self-pay | Admitting: Family Medicine

## 2015-01-20 VITALS — BP 102/60 | HR 65 | Temp 97.9°F | Ht 67.5 in | Wt 181.5 lb

## 2015-01-20 DIAGNOSIS — M7501 Adhesive capsulitis of right shoulder: Secondary | ICD-10-CM

## 2015-01-20 MED ORDER — METHYLPREDNISOLONE ACETATE 40 MG/ML IJ SUSP
80.0000 mg | Freq: Once | INTRAMUSCULAR | Status: AC
  Administered 2015-01-20: 80 mg via INTRA_ARTICULAR

## 2015-01-20 NOTE — Progress Notes (Signed)
Dr. Frederico Hamman T. Mirko Tailor, MD, [facility] [place], [address] Phone: [phone] Fax: [phone]  01/20/2015  Patient: Christian Maxwell, Christian Maxwell, Christian Maxwell, 81 y.o.  Primary Physician:  Ria Bush, MD  Chief Complaint: Shoulder Pain  Subjective:   Christian Maxwell is a 81 y.o. very pleasant male patient who presents with the following: shoulder pain  reinject R shoulder  11/25/2014 Last OV with Owens Loffler, MD  R frozen shoulder. Frozen shoulder follow-up. I remember talking by this patient quite well and going over frozen shoulder with the patient a few months ago, but the patient seems to have minimal understanding of what was said to him. He and his wife seem did not fully recall what was said about his shoulder.  He is sad of slowly progressive frozen shoulder that has been ongoing now for 6 months. He declined formal physical therapy at his last office visit. I did a combined subacromial and intra-articular injection. This did help some with his pain. He still is worsening at this point.  07/15/2014 Last OV with Owens Loffler, MD  The patient noted above presents with shoulder pain that has been ongoing for 3 mo there is no history of trauma or accident. The patient denies neck pain or radicular symptoms. No shoulder blade pain Denies dislocation, subluxation, separation of the shoulder. The patient does complain of pain with flexion, abduction, and terminal motion.  Significant restriction of motion. he describes a deep ache around the shoulder, and sometimes it will wake the patient up at night.  Medications Tried: tylenol Ice or Heat: minimal help Tried PT: No  Prior shoulder Injury: No Prior surgery: No Prior fracture: No  Past Medical History, Surgical History, Social History, Family History, Medications, and allergies reviewed and updated if relevant.   GEN: No fevers, chills.  Nontoxic. Primarily MSK c/o today. MSK: Detailed in the HPI GI: tolerating PO intake without difficulty Neuro: No numbness, parasthesias, or tingling associated. Otherwise the pertinent positives of the ROS are noted above.    Objective:   Blood pressure 102/60, pulse 65, temperature 97.9 F (36.6 C), temperature source Oral, height 5' 7.5" (1.715 m), weight 181 lb 8 oz (82.328 kg).  GEN: Well-developed,well-nourished,in no acute distress; alert,appropriate and cooperative throughout examination HEENT: Normocephalic and atraumatic without obvious abnormalities. Ears, externally no deformities PULM: Breathing comfortably in no respiratory distress EXT: No clubbing, cyanosis, or edema PSYCH: Normally interactive. Cooperative during the interview. Pleasant. Friendly and conversant. Not anxious or depressed appearing. Normal, full affect.  Shoulder: R Inspection: No muscle wasting or winging Ecchymosis/edema: neg  AC joint, scapula, clavicle: NT Cervical spine: NT, full ROM Spurling's: neg Abduction: 4+/5, to 105 Flexion: 5/5, 115 IR, full, lift-off: 5/5, minimal ER at neutral: 5/5, 70 AC crossover and compression: unable to complete Additional special testing is equivocal given lack of motion C5-T1 intact Sensation intact Grip 5/5  Assessment and Plan:   Capsulitis, adhesive shoulder, right [M75.01]  Intrarticular Shoulder Injection, RIGHT Verbal consent was obtained from the patient. Risks including infection explained and contrasted with benefits and alternatives. Patient prepped with Chloraprep and Ethyl Chloride used for anesthesia. An intraarticular shoulder injection was performed using the posterior approach. The patient tolerated the procedure well and had decreased pain post injection. No complications. Injection: 8 cc of Lidocaine 1% and Depo-Medrol 80 mg. Needle: 22 gauge   Signed,  Anjali Manzella T. Lauran Romanski, MD   Patient's Medications  New Prescriptions  No  medications on file  Previous Medications   ASPIRIN 81 MG TABLET    Take 162 mg by mouth daily.    DONEPEZIL (ARICEPT) 10 MG TABLET    Take 10 mg by mouth at bedtime.   FISH OIL-OMEGA-3 FATTY ACIDS 1000 MG CAPSULE    Take 1 g by mouth daily.    FOLIC ACID (FOLVITE) 1 MG TABLET    Take 1 mg by mouth daily.   METOPROLOL TARTRATE (LOPRESSOR) 25 MG TABLET    TAKE ONE TABLET BY MOUTH TWICE DAILY   NITROGLYCERIN (NITROSTAT) 0.4 MG SL TABLET    Place 1 tablet (0.4 mg total) under the tongue every 5 (five) minutes as needed for chest pain.   SIMVASTATIN (ZOCOR) 40 MG TABLET    TAKE 1 TABLET BY MOUTH EVERY MORNING   THIAMINE HCL (VITAMIN B-1 PO)    Take 1 tablet by mouth daily.    TIZANIDINE (ZANAFLEX) 2 MG CAPSULE    Take 1 capsule (2 mg total) by mouth at bedtime as needed for muscle spasms.   WARFARIN (COUMADIN) 5 MG TABLET    TAKE AS DIRECTED  Modified Medications   No medications on file  Discontinued Medications   No medications on file

## 2015-01-20 NOTE — Progress Notes (Signed)
Pre visit review using our clinic review tool, if applicable. No additional management support is needed unless otherwise documented below in the visit note. 

## 2015-01-20 NOTE — Addendum Note (Signed)
Addended by: Carter Kitten on: 01/20/2015 11:56 AM   Modules accepted: Orders

## 2015-01-21 ENCOUNTER — Ambulatory Visit (INDEPENDENT_AMBULATORY_CARE_PROVIDER_SITE_OTHER): Payer: Medicare Other | Admitting: *Deleted

## 2015-01-21 DIAGNOSIS — I4891 Unspecified atrial fibrillation: Secondary | ICD-10-CM | POA: Diagnosis not present

## 2015-01-21 DIAGNOSIS — Z5181 Encounter for therapeutic drug level monitoring: Secondary | ICD-10-CM | POA: Diagnosis not present

## 2015-01-21 LAB — POCT INR: INR: 2.3

## 2015-02-03 DIAGNOSIS — L02211 Cutaneous abscess of abdominal wall: Secondary | ICD-10-CM | POA: Diagnosis not present

## 2015-02-15 ENCOUNTER — Other Ambulatory Visit: Payer: Self-pay | Admitting: Cardiology

## 2015-02-15 ENCOUNTER — Other Ambulatory Visit: Payer: Self-pay | Admitting: Cardiovascular Disease

## 2015-02-22 ENCOUNTER — Encounter: Payer: Self-pay | Admitting: Family Medicine

## 2015-02-22 ENCOUNTER — Ambulatory Visit (INDEPENDENT_AMBULATORY_CARE_PROVIDER_SITE_OTHER): Payer: Medicare Other | Admitting: Family Medicine

## 2015-02-22 VITALS — BP 110/60 | HR 76 | Temp 97.5°F | Wt 184.2 lb

## 2015-02-22 DIAGNOSIS — Z23 Encounter for immunization: Secondary | ICD-10-CM

## 2015-02-22 DIAGNOSIS — S51802A Unspecified open wound of left forearm, initial encounter: Secondary | ICD-10-CM | POA: Diagnosis not present

## 2015-02-22 DIAGNOSIS — Z22322 Carrier or suspected carrier of Methicillin resistant Staphylococcus aureus: Secondary | ICD-10-CM | POA: Diagnosis not present

## 2015-02-22 DIAGNOSIS — S51809A Unspecified open wound of unspecified forearm, initial encounter: Secondary | ICD-10-CM | POA: Insufficient documentation

## 2015-02-22 DIAGNOSIS — D692 Other nonthrombocytopenic purpura: Secondary | ICD-10-CM | POA: Insufficient documentation

## 2015-02-22 MED ORDER — MUPIROCIN CALCIUM 2 % EX CREA: 1.0000 "application " | TOPICAL_CREAM | Freq: Two times a day (BID) | CUTANEOUS | Status: DC

## 2015-02-22 MED ORDER — TIZANIDINE HCL 2 MG PO CAPS: 2.0000 mg | ORAL_CAPSULE | Freq: Every evening | ORAL | Status: DC | PRN

## 2015-02-22 NOTE — Assessment & Plan Note (Signed)
Discussed his use of coumadin, aspirin 162mg  BID, and fish oil - and their relation to increased bruising/bleeding. Also discussed thinning of skin with age. Consider coming off fish oil.

## 2015-02-22 NOTE — Progress Notes (Signed)
Pre visit review using our clinic review tool, if applicable. No additional management support is needed unless otherwise documented below in the visit note. 

## 2015-02-22 NOTE — Patient Instructions (Signed)
Flu shot today Use mupirocin cream on wounds and wrap once daily like Maudie Mercury shows you today. May moisturize skin daily as well. Watch for streaking or spreading redness, or let us know if not improving with treatment.

## 2015-02-22 NOTE — Assessment & Plan Note (Signed)
He is on doxycycline 100mg  BID for last several months prescribed by general surgery Dr [name].

## 2015-02-22 NOTE — Assessment & Plan Note (Addendum)
Several open arm wounds with concern for infection due to surrounding erythema. Anticipate superficial infection. Pt currently on doxycycline per surgery. Discussed possible MRSA - will treat with topical mupirocin and dressing changes daily - recommended kerlex dressing changes daily so he doesn't need to use tape on skin. Update if not improving with treatment, especially if any worsening (ie streaking erythema) If poorly healing, would consider sporortrichosis/itraconazole treatment.

## 2015-02-22 NOTE — Addendum Note (Signed)
Addended by: Royann Shivers A on: 02/22/2015 05:57 PM   Modules accepted: Orders

## 2015-02-22 NOTE — Progress Notes (Signed)
BP 110/60 mmHg  Pulse 76  Temp(Src) 97.5 F (36.4 C) (Oral)  Wt 184 lb 4 oz (83.575 kg)   CC: Check L arm ?skin infection  Subjective:    Patient ID: Christian Maxwell, male    DOB: [birth year], 81 y.o.   MRN: [id number]  HPI: Christian Maxwell is a 81 y.o. male presenting on 02/22/2015 for Cellulitis   Thursday while placing fertilizer around [place], had several rose throrns that stuck him left arm/forearm. Spreading bruising since then, areas of wound with some surrounding erythema and induration.   No pruritis, no pain. No fevers/chills, nausea/vomiting. No other lesions on skin.   Has been using neosporin cream.   H/o MRSA with recurrent abscesses (suprapubic) followed by surgery. On doxycycline 100mg  BID - restarted 2 mo ago.   Relevant past medical, surgical, family and social history reviewed and updated as indicated. Interim medical history since our last visit reviewed. Allergies and medications reviewed and updated. Current Outpatient Prescriptions on File Prior to Visit  Medication Sig  . donepezil (ARICEPT) 10 MG tablet Take 10 mg by mouth at bedtime.  . fish oil-omega-3 fatty acids 1000 MG capsule Take 1 g by mouth daily.   . folic acid (FOLVITE) 1 MG tablet Take 1 mg by mouth daily.  . metoprolol tartrate (LOPRESSOR) 25 MG tablet TAKE ONE TABLET BY MOUTH TWICE DAILY  . simvastatin (ZOCOR) 40 MG tablet TAKE 1 TABLET BY MOUTH EVERY MORNING  . Thiamine HCl (VITAMIN B-1 PO) Take 1 tablet by mouth daily.   [name] warfarin (COUMADIN) 5 MG tablet TAKE AS DIRECTED  . nitroGLYCERIN (NITROSTAT) 0.4 MG SL tablet Place 1 tablet (0.4 mg total) under the tongue every 5 (five) minutes as needed for chest pain. (Patient not taking: Reported on 02/22/2015)   No current facility-administered medications on file prior to visit.    Review of Systems Per HPI unless specifically indicated above     Objective:    BP 110/60 mmHg  Pulse 76  Temp(Src) 97.5 F (36.4 C) (Oral)  Wt 184 lb  4 oz (83.575 kg)  Wt Readings from Last 3 Encounters:  02/22/15 184 lb 4 oz (83.575 kg)  01/20/15 181 lb 8 oz (82.328 kg)  11/25/14 177 lb 12 oz (80.627 kg)    Physical Exam  Constitutional: He appears well-developed and well-nourished. No distress.  Musculoskeletal: He exhibits no edema.  Skin: Skin is warm and dry. Abrasion, bruising, ecchymosis and lesion noted.     LUE with ecchymoses at forearm  3 separate erosions L arm/forearm with mild surrounding erythema and induration, no fluctuance or drainage   Nursing note and vitals reviewed.  Results for orders placed or performed in visit on 01/21/15  POCT INR  Result Value Ref Range   INR 2.3       Assessment & Plan:   Problem List Items Addressed This Visit    Wound, open, arm, forearm - Primary    Several open arm wounds with concern for infection due to surrounding erythema. Anticipate superficial infection. Pt currently on doxycycline per surgery. Discussed possible MRSA - will treat with topical mupirocin and dressing changes daily - recommended kerlex dressing changes daily so he doesn't need to use tape on skin. Update if not improving with treatment, especially if any worsening (ie streaking erythema) If poorly healing, would consider sporortrichosis/itraconazole treatment.      Senile purpura (HCC)    Discussed his use of coumadin, aspirin 162mg  BID, and fish oil -  and their relation to increased bruising/bleeding. Also discussed thinning of skin with age. Consider coming off fish oil.       Relevant Medications   aspirin 81 MG tablet   MRSA colonization    He is on doxycycline 100mg  BID for last several months prescribed by general surgery Dr [name].      Relevant Medications   mupirocin cream (BACTROBAN) 2 %       Follow up plan: Return if symptoms worsen or fail to improve.

## 2015-02-23 ENCOUNTER — Ambulatory Visit: Payer: Medicare Other | Admitting: Gastroenterology

## 2015-03-02 DIAGNOSIS — R413 Other amnesia: Secondary | ICD-10-CM | POA: Diagnosis not present

## 2015-03-02 DIAGNOSIS — I480 Paroxysmal atrial fibrillation: Secondary | ICD-10-CM | POA: Diagnosis not present

## 2015-03-02 DIAGNOSIS — R740 Nonspecific elevation of levels of transaminase and lactic acid dehydrogenase [LDH]: Secondary | ICD-10-CM | POA: Diagnosis not present

## 2015-03-02 DIAGNOSIS — E0500 Thyrotoxicosis with diffuse goiter without thyrotoxic crisis or storm: Secondary | ICD-10-CM | POA: Diagnosis not present

## 2015-03-02 DIAGNOSIS — E785 Hyperlipidemia, unspecified: Secondary | ICD-10-CM | POA: Diagnosis not present

## 2015-03-02 DIAGNOSIS — Z6826 Body mass index (BMI) 26.0-26.9, adult: Secondary | ICD-10-CM | POA: Diagnosis not present

## 2015-03-02 DIAGNOSIS — Z1389 Encounter for screening for other disorder: Secondary | ICD-10-CM | POA: Diagnosis not present

## 2015-03-02 DIAGNOSIS — C189 Malignant neoplasm of colon, unspecified: Secondary | ICD-10-CM | POA: Diagnosis not present

## 2015-03-02 DIAGNOSIS — I2510 Atherosclerotic heart disease of native coronary artery without angina pectoris: Secondary | ICD-10-CM | POA: Diagnosis not present

## 2015-03-03 DIAGNOSIS — L02211 Cutaneous abscess of abdominal wall: Secondary | ICD-10-CM | POA: Diagnosis not present

## 2015-04-14 DIAGNOSIS — L0291 Cutaneous abscess, unspecified: Secondary | ICD-10-CM | POA: Diagnosis not present

## 2015-04-14 DIAGNOSIS — L02211 Cutaneous abscess of abdominal wall: Secondary | ICD-10-CM | POA: Diagnosis not present

## 2015-04-21 ENCOUNTER — Ambulatory Visit: Payer: Medicare Other | Admitting: Gastroenterology

## 2015-04-30 ENCOUNTER — Ambulatory Visit: Payer: Medicare Other | Admitting: Gastroenterology

## 2015-05-24 ENCOUNTER — Ambulatory Visit: Payer: Medicare Other | Admitting: Cardiovascular Disease

## 2015-05-26 NOTE — Progress Notes (Signed)
Cardiology Office Note    Date:  05/27/2015   ID:  Christian Maxwell, DOB 11/28/1932, MRN [id number]  PCP:  Ria Bush, MD  Cardiologist:  Dr. Liam Rogers   Electrophysiologist:  n/a  Chief Complaint  Patient presents with  . Follow-up  . Atrial Fibrillation    History of Present Illness:  Christian Maxwell is a 82 y.o. male with a hx of permanent AFib, HTN, HL, hypothyroidism. Last seen by Dr. Acie Fredrickson 5/16.  He complained of chest discomfort and shortness of breath. Lexiscan Myoview was negative for ischemia.  Returns for FU.  He is here with his wife. He denies any symptoms.  But his wife notes he gets out of breath with walking up steps. This seems to be stable.  She notes that he occasionally complains of chest tightness that goes away when he takes his Metoprolol.  He denies syncope, orthopnea, PND, edema.  He denies any bleeding, melena, hematochezia.      Past Medical History  Diagnosis Date  . Hyperlipidemia   . Syncope and collapse     pt denies  . Chronic atrial fibrillation ([place])   . PVC's (premature ventricular contractions)   . Abscess 7/13    abd abscess  . History of colon cancer 2000    T3N0 s/p colectomy  . History of stress test     a. Cardiolite in 9/04: EF 67%, no ischemia.;  b. Myoview 6/16: no ischemia   . METHICILLIN RESISTANT STAPHYLOCOCCUS AUREUS INFECTION 12/12/2006    Annotation: stitch abcess in lower abdomen after colon cancer resection Qualifier: Diagnosis of  By: [name] MD, [name]    . Postoperative stitch abscess 07/19/2011  . Hx of adenomatous colonic polyps   . Thyroid disease     followed by [name]  . Hypertension   . History of echocardiogram     a. Echo 2/13:  Mild LVH, EF 55-60%, mild MR, moderate LAE, mild to moderate RAE.    Past Surgical History  Procedure Laterality Date  . Lumbar laminectomy  1977  . Wound exploration  2006    For possible stitch abscess Dr [name]  . Incise and drain abcess  2009,2010,2011,2012  .  Colectomy  2000    6 inches removed  . Mass excision  03/07/2012    EXCISION MASS;  Surgeon: [name], MD;  Laterality: Right;  removal mass posterior right arm  . Polypectomy    . Colonoscopy  2009    polyp, h/o cancer [name])  . Incision and drainage abscess N/A 09/20/2012    Procedure: INCISION AND DRAINAGE SUPRAPUBIC ABSCESS;  Surgeon: [name], MD;  Location: [facility];  Service: General;  Laterality: N/A;  . Hernia repair  [id number]    DB umbilical hernia  . Eye surgery      cataract surgery both eyes  . Incision and drainage abscess Left 02/28/2013    Procedure: INCISION AND DRAINAGE LEFT ARM ABSCESS;  Surgeon: [name], MD;  Location: [facility];  Service: General;  Laterality: Left;  . Incision and drainage Right 04/02/2013    Procedure: INCISION AND DRAINAGE RIGHT KNEE HEMATOMA;  Surgeon: [name], MD;  Location: [facility];  Service: Orthopedics;  Laterality: Right;    Current Medications: Previous Medications   ASPIRIN 81 MG TABLET    Take 2 tablets (162 mg total) by mouth 2 (two) times daily.   DONEPEZIL (ARICEPT) 10 MG TABLET    Take 10 mg by  mouth at bedtime.   DOXYCYCLINE (VIBRAMYCIN) 100 MG CAPSULE    Take 1 capsule (100 mg total) by mouth 2 (two) times daily. Per surgery   FISH OIL-OMEGA-3 FATTY ACIDS 1000 MG CAPSULE    Take 1 g by mouth daily.    FOLIC ACID (FOLVITE) 1 MG TABLET    Take 1 mg by mouth daily.   METOPROLOL TARTRATE (LOPRESSOR) 25 MG TABLET    TAKE ONE TABLET BY MOUTH TWICE DAILY   MUPIROCIN CREAM (BACTROBAN) 2 %    Apply 1 application topically 2 (two) times daily.   NITROGLYCERIN (NITROSTAT) 0.4 MG SL TABLET    Place 1 tablet (0.4 mg total) under the tongue every 5 (five) minutes as needed for chest pain.   SIMVASTATIN (ZOCOR) 40 MG TABLET    TAKE 1 TABLET BY MOUTH EVERY MORNING   THIAMINE HCL (VITAMIN B-1 PO)    Take 1 tablet by mouth daily.    TIZANIDINE (ZANAFLEX) 2 MG CAPSULE    Take 1 capsule (2 mg total) by  mouth at bedtime as needed for muscle spasms.   WARFARIN (COUMADIN) 5 MG TABLET    TAKE AS DIRECTED      Allergies:   Review of patient's allergies indicates no known allergies.   Social History   Social History  . Marital Status: Married    Spouse Name: N/A  . Number of Children: N/A  . Years of Education: N/A   Social History Main Topics  . Smoking status: Former Smoker    Quit date: 10/13/1977  . Smokeless tobacco: Former Systems developer    Types: Chew    Quit date: 10/13/1977  . Alcohol Use: No  . Drug Use: No  . Sexual Activity: Yes   Other Topics Concern  . None   Social History Narrative   Caffeine: none   Lives with wife (27yrs).  Grown daughters x2   Occupation: retired, had Human resources officer   Activity: take care of 3 houses   Diet: fruits/vegetables daily, good water      Cards: Ecologist   [facility]: [facility]   Surg: [name]   GIDeatra Ina     Family History:  The patient's family history includes CAD (age of onset: 16) in his brother; CAD (age of onset: 78) in his father; Dementia in his brother and father; Dementia (age of onset: 65) in his sister; Diabetes in his brother; Hypertension in his father; Stroke in his brother, brother, father, and mother; Stroke (age of onset: 55) in his sister. There is no history of Cancer.   ROS:   Please see the history of present illness.    Review of Systems  Hematologic/Lymphatic: Bruises/bleeds easily.  All other systems reviewed and are negative.   PHYSICAL EXAM:   VS:  BP 104/48 mmHg  Pulse 70  Ht 5\' 10"  (1.778 m)  Wt 186 lb 2.1 oz (84.429 kg)  BMI 26.71 kg/m2   GEN: Well nourished, well developed, in no acute distress HEENT: normal Neck: no JVD, no masses Cardiac: Normal S1/S2, irreg irreg rhythm; no murmurs, rubs, or gallops, no edema Respiratory:  clear to auscultation bilaterally; no wheezing, rhonchi or rales GI: soft, nontender, nondistended  MS: no deformity or atrophy Skin: warm and dry, no rash Neuro:    no focal  deficits  Psych: Alert and oriented x 3, normal affect  Wt Readings from Last 3 Encounters:  05/27/15 186 lb 2.1 oz (84.429 kg)  02/22/15 184 lb 4 oz (83.575 kg)  01/20/15  181 lb 8 oz (82.328 kg)      Studies/Labs Reviewed:   EKG:  EKG is  ordered today.  The ekg ordered today demonstrates AFib, HR 71, LBBB  Recent Labs: 10/30/2014: ALT 18; BUN 15; Creatinine, Ser 0.78; Potassium 4.0; Sodium 136; TSH 2.49   Recent Lipid Panel    Component Value Date/Time   CHOL 143 10/30/2014 0845   TRIG 141.0 10/30/2014 0845   HDL 42.40 10/30/2014 0845   CHOLHDL 3 10/30/2014 0845   VLDL 28.2 10/30/2014 0845   LDLCALC 72 10/30/2014 0845    Additional studies/ records that were reviewed today include:   Echo 06/2011:  Mild LVH, EF 55-60%, mild MR, moderate LAE, mild to moderate RAE.  Myoview 11/02/14 No ischemia.  Not gated   ASSESSMENT:    1. Chronic atrial fibrillation (HCC)   2. Chest tightness   3. Hyperlipidemia   4. LBBB (left bundle branch block)     PLAN:  In order of problems listed above:  1. AFib - Rate is controlled.  Continue Coumadin.  2. Chest tightness - Symptoms are somewhat atypical. He is able to exercise on his stationary bike 2 x a day without chest pain.  Recent nuclear stress test low risk.  However, now he has a LBBB on ECG.  Will get an echo.    3. HL - Followed by PCP.  Continue statin.  LDL in 6/16 was 72.  4. LBBB - Prior ECGs reviewed.  This is new. As noted, recent nuclear stress test low risk.  Nuc was not gated.  Will get echo to r/o DCM.  FU in 3 mos.    Medication Adjustments/Labs and Tests Ordered: Current medicines are reviewed at length with the patient today.  Concerns regarding medicines are outlined above.  Medication changes, Labs and Tests ordered today are outlined in the Patient Instructions noted below.  Signed, Richardson Dopp, PA-C  05/27/2015 2:33 PM    [facility] [place], [place], [place]   [address] Phone: [phone]; Fax: [phone]    Patient Instructions  Medication Instructions:   Your physician recommends that you continue on your current medications as directed. Please refer to the Current Medication list given to you today.  If you need a refill on your cardiac medications before your next appointment, please call your pharmacy.  Labwork:  NONE ORDER TODAY  Testing/Procedures: Your physician has requested that you have an echocardiogram. Echocardiography is a painless test that uses sound waves to create images of your heart. It provides your doctor with information about the size and shape of your heart and how well your heart's chambers and valves are working. This procedure takes approximately one hour. There are no restrictions for this procedure.  Follow-Up:  WITH DR Acie Fredrickson IN 3 MONTHS  Any Other Special Instructions Will Be Listed Below (If Applicable).

## 2015-05-27 ENCOUNTER — Ambulatory Visit (INDEPENDENT_AMBULATORY_CARE_PROVIDER_SITE_OTHER): Payer: Medicare Other | Admitting: Physician Assistant

## 2015-05-27 ENCOUNTER — Encounter: Payer: Self-pay | Admitting: Physician Assistant

## 2015-05-27 VITALS — BP 104/48 | HR 70 | Ht 70.0 in | Wt 186.1 lb

## 2015-05-27 DIAGNOSIS — I482 Chronic atrial fibrillation, unspecified: Secondary | ICD-10-CM

## 2015-05-27 DIAGNOSIS — I447 Left bundle-branch block, unspecified: Secondary | ICD-10-CM | POA: Diagnosis not present

## 2015-05-27 DIAGNOSIS — R0789 Other chest pain: Secondary | ICD-10-CM

## 2015-05-27 DIAGNOSIS — E785 Hyperlipidemia, unspecified: Secondary | ICD-10-CM | POA: Diagnosis not present

## 2015-05-27 NOTE — Patient Instructions (Addendum)
Medication Instructions:   Your physician recommends that you continue on your current medications as directed. Please refer to the Current Medication list given to you today.  If you need a refill on your cardiac medications before your next appointment, please call your pharmacy.  Labwork:  NONE ORDER TODAY  Testing/Procedures: Your physician has requested that you have an echocardiogram. Echocardiography is a painless test that uses sound waves to create images of your heart. It provides your doctor with information about the size and shape of your heart and how well your heart's chambers and valves are working. This procedure takes approximately one hour. There are no restrictions for this procedure.  Follow-Up:  WITH DR Acie Fredrickson IN 3 MONTHS  Any Other Special Instructions Will Be Listed Below (If Applicable).

## 2015-06-07 ENCOUNTER — Telehealth: Payer: Self-pay

## 2015-06-07 MED ORDER — ZOLPIDEM TARTRATE 5 MG PO TABS: 5.0000 mg | ORAL_TABLET | Freq: Every evening | ORAL | Status: DC | PRN

## 2015-06-07 NOTE — Telephone Encounter (Signed)
Christian Maxwell(DPR signed) left v/m requesting refill zolpidem to [facility]; last refilled # [phone] x 0 on 09/11/12 by Dr Georganna Skeans. Pt last medicare wellness 11/06/14.Please advise.Not on current med list.

## 2015-06-07 NOTE — Telephone Encounter (Signed)
Failed trazodone in the past.  May phone in [place] 5mg  nightly and will need to discuss at next visit.

## 2015-06-08 NOTE — Telephone Encounter (Signed)
Rx called in as directed.   

## 2015-06-09 DIAGNOSIS — L02211 Cutaneous abscess of abdominal wall: Secondary | ICD-10-CM | POA: Diagnosis not present

## 2015-06-09 DIAGNOSIS — L989 Disorder of the skin and subcutaneous tissue, unspecified: Secondary | ICD-10-CM | POA: Diagnosis not present

## 2015-06-14 ENCOUNTER — Encounter: Payer: Self-pay | Admitting: Physician Assistant

## 2015-06-14 ENCOUNTER — Ambulatory Visit (HOSPITAL_COMMUNITY): Payer: Medicare Other | Attending: Internal Medicine

## 2015-06-14 ENCOUNTER — Telehealth: Payer: Self-pay | Admitting: *Deleted

## 2015-06-14 ENCOUNTER — Other Ambulatory Visit: Payer: Self-pay

## 2015-06-14 DIAGNOSIS — I447 Left bundle-branch block, unspecified: Secondary | ICD-10-CM | POA: Diagnosis not present

## 2015-06-14 DIAGNOSIS — I071 Rheumatic tricuspid insufficiency: Secondary | ICD-10-CM | POA: Diagnosis not present

## 2015-06-14 DIAGNOSIS — I351 Nonrheumatic aortic (valve) insufficiency: Secondary | ICD-10-CM | POA: Diagnosis not present

## 2015-06-14 DIAGNOSIS — E785 Hyperlipidemia, unspecified: Secondary | ICD-10-CM | POA: Diagnosis not present

## 2015-06-14 DIAGNOSIS — I340 Nonrheumatic mitral (valve) insufficiency: Secondary | ICD-10-CM | POA: Insufficient documentation

## 2015-06-14 DIAGNOSIS — I517 Cardiomegaly: Secondary | ICD-10-CM | POA: Diagnosis not present

## 2015-06-14 DIAGNOSIS — I10 Essential (primary) hypertension: Secondary | ICD-10-CM | POA: Diagnosis not present

## 2015-06-14 NOTE — Telephone Encounter (Signed)
No DPR on file for wife, Pt gave me verbal permssion to s/w wife as well. Both have been notified of echo results and findings. Pt also scheduled f/u w/Dr. Acie Fredrickson to further go over results as well. pt had recall in chart for appt.

## 2015-06-25 ENCOUNTER — Encounter: Payer: Self-pay | Admitting: Cardiovascular Disease

## 2015-06-25 ENCOUNTER — Ambulatory Visit (INDEPENDENT_AMBULATORY_CARE_PROVIDER_SITE_OTHER): Payer: Medicare Other | Admitting: Cardiovascular Disease

## 2015-06-25 VITALS — BP 100/68 | HR 60 | Ht 70.0 in | Wt 188.0 lb

## 2015-06-25 DIAGNOSIS — R0789 Other chest pain: Secondary | ICD-10-CM | POA: Diagnosis not present

## 2015-06-25 DIAGNOSIS — I482 Chronic atrial fibrillation, unspecified: Secondary | ICD-10-CM

## 2015-06-25 NOTE — Patient Instructions (Signed)
Medication Instructions:  Your physician recommends that you continue on your current medications as directed. Please refer to the Current Medication list given to you today.   Labwork: None Ordered   Testing/Procedures: None Ordered   Follow-Up: Your physician wants you to follow-up in: 6 months with Dr. Nahser.  You will receive a reminder letter in the mail two months in advance. If you don't receive a letter, please call our office to schedule the follow-up appointment.   If you need a refill on your cardiac medications before your next appointment, please call your pharmacy.   Thank you for choosing [facility]! Janel Beane, RN [phone]    

## 2015-06-25 NOTE — Progress Notes (Signed)
Cardiology Office Note   Date:  06/25/2015   ID:  Christian Maxwell, DOB 04/27/1933, MRN [id number]  PCP:  Ria Bush, MD  Cardiologist:  Acie Fredrickson, Wonda Cheng, MD   Chief Complaint  Patient presents with  . Atrial Fibrillation   1 . Atrial fibrillation 2. Hyperlipidemia 3. Premature ventricular contractions 4. Hypothyroidism   [name] is a 82 year old gentleman with a history of atrial fibrillation, hyperlipidemia, and a history of PVCs. He exercises on a regular basis. He has not had any episodes of chest pain or shortness of breath. He denies any syncope or presyncope.  He's doing much better. He was having some shortness of breath earlier in the year and the end of last year.  Dec. 4, 2013 :  He presents today with some right sided chest wall pain. The discomfort increases with deep breath, right arm movement and with twisting of his torso. No dyspnea. No angina.   May 22, 2011: He's doing very well at this point. He is no longer having any episodes of chest wall pain. He still rides a stationary bike for 30 minutes twice a day and does not have any episodes of pain.  Feb. 5, 2015:  Christian Maxwell has no complaints. He was seen with his wife. It is clear that he is gradually getting more and more demented. His wife states that he breathes heavily when climbing stairs. He's able to do all of his normal activities without any significant difficulties. He does notice that he'll typically develop some chest pain that she's late taking his second metoprolol dose of the day.   Sep 30, 2013:  Levester is doing well. Stays busy.   Oct. 5, 2015:   Tyjay continues to have DOE. His wife says that he has lots of dyspnea - he denies it.    Sep 28, 2014:  Christian Maxwell is a 82 y.o. male who presents for atrial fib .  He has had more dyspnea and some chest pain  - especially with exertion.  Has difficulty describing his chest pain .   We have suggested a stress  test in the past.  He now agrees to do the test.   Feb.   10 , 2017: Doing well. No complaints.  Saw Richardson Dopp last month. Echo showed normal LV function.   Has marked LAE.   Trace MR, moderate TR .    Past Medical History  Diagnosis Date  . Hyperlipidemia   . Syncope and collapse     pt denies  . Chronic atrial fibrillation ([place])   . PVC's (premature ventricular contractions)   . Abscess 7/13    abd abscess  . History of colon cancer 2000    T3N0 s/p colectomy  . History of stress test     a. Cardiolite in 9/04: EF 67%, no ischemia.;  b. Myoview 6/16: no ischemia   . METHICILLIN RESISTANT STAPHYLOCOCCUS AUREUS INFECTION 12/12/2006    Annotation: stitch abcess in lower abdomen after colon cancer resection Qualifier: Diagnosis of  By: [name] MD, [name]    . Postoperative stitch abscess 07/19/2011  . Hx of adenomatous colonic polyps   . Thyroid disease     followed by [name]  . Hypertension   . History of echocardiogram     a. Echo 2/13:  Mild LVH, EF 55-60%, mild MR, moderate LAE, mild to moderate RAE;  b. Echo 1/17: mild LVH, vigorous LVF, EF 65-70%, no RWMA, trivial AI, trivial MR, severe LAE,  mild RAE, mod TR, PASP 32 mmHg    Past Surgical History  Procedure Laterality Date  . Lumbar laminectomy  1977  . Wound exploration  2006    For possible stitch abscess Dr [name]  . Incise and drain abcess  2009,2010,2011,2012  . Colectomy  2000    6 inches removed  . Mass excision  03/07/2012    EXCISION MASS;  Surgeon: [name], MD;  Laterality: Right;  removal mass posterior right arm  . Polypectomy    . Colonoscopy  2009    polyp, h/o cancer Christian Maxwell)  . Incision and drainage abscess N/A 09/20/2012    Procedure: INCISION AND DRAINAGE SUPRAPUBIC ABSCESS;  Surgeon: [name], MD;  Location: [facility];  Service: General;  Laterality: N/A;  . Hernia repair  [id number]    DB umbilical hernia  . Eye surgery      cataract surgery both eyes  .  Incision and drainage abscess Left 02/28/2013    Procedure: INCISION AND DRAINAGE LEFT ARM ABSCESS;  Surgeon: [name], MD;  Location: [facility];  Service: General;  Laterality: Left;  . Incision and drainage Right 04/02/2013    Procedure: INCISION AND DRAINAGE RIGHT KNEE HEMATOMA;  Surgeon: [name], MD;  Location: [facility];  Service: Orthopedics;  Laterality: Right;     Current Outpatient Prescriptions  Medication Sig Dispense Refill  . aspirin 81 MG tablet Take 2 tablets (162 mg total) by mouth 2 (two) times daily.    [name] donepezil (ARICEPT) 10 MG tablet Take 10 mg by mouth at bedtime.    [name] doxycycline (VIBRAMYCIN) 100 MG capsule Take 1 capsule (100 mg total) by mouth 2 (two) times daily. Per surgery    . fish oil-omega-3 fatty acids 1000 MG capsule Take 1 g by mouth daily.     . folic acid (FOLVITE) 1 MG tablet Take 1 mg by mouth daily.    . metoprolol tartrate (LOPRESSOR) 25 MG tablet TAKE ONE TABLET BY MOUTH TWICE DAILY 60 tablet 9  . mupirocin cream (BACTROBAN) 2 % Apply 1 application topically 2 (two) times daily. 30 g 1  . nitroGLYCERIN (NITROSTAT) 0.4 MG SL tablet Place 1 tablet (0.4 mg total) under the tongue every 5 (five) minutes as needed for chest pain. 25 tablet 6  . simvastatin (ZOCOR) 40 MG tablet TAKE 1 TABLET BY MOUTH EVERY MORNING 90 tablet 3  . Thiamine HCl (VITAMIN B-1 PO) Take 1 tablet by mouth daily.     . tizanidine (ZANAFLEX) 2 MG capsule Take 1 capsule (2 mg total) by mouth at bedtime as needed for muscle spasms. 30 capsule 2  . warfarin (COUMADIN) 5 MG tablet TAKE AS DIRECTED 40 tablet 3  . zolpidem (AMBIEN) 5 MG tablet Take 1 tablet (5 mg total) by mouth at bedtime as needed for sleep. 30 tablet 0   No current facility-administered medications for this visit.    Allergies:   Review of patient's allergies indicates no known allergies.    Social History:  The patient  reports that he quit smoking about 37 years ago. He quit smokeless tobacco use  about 37 years ago. His smokeless tobacco use included Chew. He reports that he does not drink alcohol or use illicit drugs.   Family History:  The patient's family history includes CAD (age of onset: 2) in his brother; CAD (age of onset: 57) in his father; Dementia in his brother and father; Dementia (age of onset: 62) in his  sister; Diabetes in his brother; Hypertension in his father; Stroke in his brother, brother, father, and mother; Stroke (age of onset: 53) in his sister. There is no history of Cancer.    ROS:  Please see the history of present illness.    Review of Systems: Constitutional:  denies fever, chills, diaphoresis, appetite change and fatigue.  HEENT: denies photophobia, eye pain, redness, hearing loss, ear pain, congestion, sore throat, rhinorrhea, sneezing, neck pain, neck stiffness and tinnitus.  Respiratory: denies SOB, DOE, cough, chest tightness, and wheezing.  Cardiovascular: denies chest pain, palpitations and leg swelling.  Gastrointestinal: denies nausea, vomiting, abdominal pain, diarrhea, constipation, blood in stool.  Genitourinary: denies dysuria, urgency, frequency, hematuria, flank pain and difficulty urinating.  Musculoskeletal: denies  myalgias, back pain, joint swelling, arthralgias and gait problem.   Skin: denies pallor, rash and wound.  Neurological: denies dizziness, seizures, syncope, weakness, light-headedness, numbness and headaches.   Hematological: denies adenopathy, easy bruising, personal or family bleeding history.  Psychiatric/ Behavioral: denies suicidal ideation, mood changes, confusion, nervousness, sleep disturbance and agitation.       All other systems are reviewed and negative.    PHYSICAL EXAM: VS:  BP 100/68 mmHg  Pulse 60  Ht 5\' 10"  (1.778 m)  Wt 188 lb (85.276 kg)  BMI 26.98 kg/m2 , BMI Body mass index is 26.98 kg/(m^2). GEN: Well nourished, well developed, in no acute distress HEENT: normal Neck: no JVD, carotid bruits,  or masses Cardiac: ; no murmurs, rubs, or gallops,no edema  Respiratory:  clear to auscultation bilaterally, normal work of breathing GI: soft, nontender, nondistended, + BS MS: no deformity or atrophy Skin: warm and dry, no rash Neuro:  Strength and sensation are intact Psych: normal   EKG:  EKG is not ordered today.    Recent Labs: 10/30/2014: ALT 18; BUN 15; Creatinine, Ser 0.78; Potassium 4.0; Sodium 136; TSH 2.49    Lipid Panel    Component Value Date/Time   CHOL 143 10/30/2014 0845   TRIG 141.0 10/30/2014 0845   HDL 42.40 10/30/2014 0845   CHOLHDL 3 10/30/2014 0845   VLDL 28.2 10/30/2014 0845   LDLCALC 72 10/30/2014 0845      Wt Readings from Last 3 Encounters:  06/25/15 188 lb (85.276 kg)  05/27/15 186 lb 2.1 oz (84.429 kg)  02/22/15 184 lb 4 oz (83.575 kg)      Other studies Reviewed: Additional studies/ records that were reviewed today include: . Review of the above records demonstrates:    ASSESSMENT AND PLAN:  1 . Atrial fibrillation- his rate  is very well-controlled. Continue current medications. 2. Hyperlipidemia 3. Premature ventricular contractions 4. Hypothyroidism3 5.  Chest tightness/dyspnea. Patient  Is doing well.   No further episodes of CP    Current medicines are reviewed at length with the patient today.  The patient does not have concerns regarding medicines.  The following changes have been made:  no change  Labs/ tests ordered today include:  No orders of the defined types were placed in this encounter.     Disposition:   FU with me in 6 months      Versie Soave, Wonda Cheng, MD  06/25/2015 10:49 AM    [facility] [place], [place], [place]  [address] Phone: [phone]; Fax: [phone]   [facility]  [address] [place], [place]  [address] [phone]    Fax [phone]

## 2015-07-28 DIAGNOSIS — L02211 Cutaneous abscess of abdominal wall: Secondary | ICD-10-CM | POA: Diagnosis not present

## 2015-07-30 ENCOUNTER — Telehealth: Payer: Self-pay

## 2015-07-30 ENCOUNTER — Ambulatory Visit (INDEPENDENT_AMBULATORY_CARE_PROVIDER_SITE_OTHER): Payer: Medicare Other | Admitting: Gastroenterology

## 2015-07-30 ENCOUNTER — Encounter: Payer: Self-pay | Admitting: Gastroenterology

## 2015-07-30 VITALS — BP 110/56 | HR 72 | Ht 68.0 in | Wt 189.4 lb

## 2015-07-30 DIAGNOSIS — Z86010 Personal history of colonic polyps: Secondary | ICD-10-CM | POA: Diagnosis not present

## 2015-07-30 DIAGNOSIS — Z7901 Long term (current) use of anticoagulants: Secondary | ICD-10-CM

## 2015-07-30 DIAGNOSIS — Z85038 Personal history of other malignant neoplasm of large intestine: Secondary | ICD-10-CM

## 2015-07-30 NOTE — Telephone Encounter (Signed)
I called the patient to clarify why the Cologuard wasn't covered and gave him an estimate as to how much it would cost out of pocket and he declined. Options are colonoscopy, no further screening, or other stool testing, however with his history of colon cancer and if he otherwise is in fair heath, recommend optical colonoscopy if he is going to have any testing at all. He is going on vacation for a few weeks on Monday, and will be back in a few weeks. He will call to schedule at that time. Can you put this patient on a list to be called to schedule in a few weeks if he does not contact us. Thanks

## 2015-07-30 NOTE — Patient Instructions (Signed)
We have sent your demographic and insurance information to [facility]. They should contact you within the next week regarding your Cologuard (colon cancer screening) test. If you have not heard from them within the next week, please call our office at [phone]. 

## 2015-07-30 NOTE — Telephone Encounter (Signed)
Sent myself a reminder to contact patient in two weeks to schedule Colonoscopy.

## 2015-07-30 NOTE — Progress Notes (Signed)
HPI :  82 y/o male here for a follow up visit. He is new to me. He has a history of atrial fibrillation on Coumadin, history of sigmoid colon cancer,   He was diagnosed with colon cancer in 2000. He had a sigmoid colon resection. Following his surgery he developed an MRSA infection and reports ongoing issues with suprapubic abscesses from MRSA, followed by general surgery. He is on doxycylin currently per surgery for this issue.   Regarding his bowel function, he has 2-3 BMs per day. He does not have any blood in the stools. No abdominal pains. Weight is stable, no weight loss. He is eating well.  He takes coumadin for history of AF. He has had a prior heart attack in 1985. No shortness of breath or chest pains. He follows with cardiology and is doing well in this regard. He generally has no complaints and is very functional. His last colonoscopy was in 2009 in which a 1cm adenoma was removed. He was scheduled for a colonoscopy in 2014 but it was cancelled due to abdominal infection and he states he forgot to follow up for it.   Colonoscopy 05/2007 - 1cm adenoma descending colon   Past Medical History  Diagnosis Date  . Hyperlipidemia   . Syncope and collapse     pt denies  . Chronic atrial fibrillation ([place])   . PVC's (premature ventricular contractions)   . Abscess 7/13    abd abscess  . History of colon cancer 2000    T3N0 s/p colectomy  . History of stress test     a. Cardiolite in 9/04: EF 67%, no ischemia.;  b. Myoview 6/16: no ischemia   . METHICILLIN RESISTANT STAPHYLOCOCCUS AUREUS INFECTION 12/12/2006    Annotation: stitch abcess in lower abdomen after colon cancer resection Qualifier: Diagnosis of  By: [name] MD, [name]    . Postoperative stitch abscess 07/19/2011  . Hx of adenomatous colonic polyps   . Thyroid disease     followed by [name]  . Hypertension   . History of echocardiogram     a. Echo 2/13:  Mild LVH, EF 55-60%, mild MR, moderate LAE, mild to moderate RAE;   b. Echo 1/17: mild LVH, vigorous LVF, EF 65-70%, no RWMA, trivial AI, trivial MR, severe LAE, mild RAE, mod TR, PASP 32 mmHg     Past Surgical History  Procedure Laterality Date  . Lumbar laminectomy  1977  . Wound exploration  2006    For possible stitch abscess Dr [name]  . Incise and drain abcess  2009,2010,2011,2012  . Colectomy  2000    6 inches removed  . Mass excision  03/07/2012    EXCISION MASS;  Surgeon: [name], MD;  Laterality: Right;  removal mass posterior right arm  . Polypectomy    . Colonoscopy  2009    polyp, h/o cancer [name])  . Incision and drainage abscess N/A 09/20/2012    Procedure: INCISION AND DRAINAGE SUPRAPUBIC ABSCESS;  Surgeon: [name], MD;  Location: [facility];  Service: General;  Laterality: N/A;  . Hernia repair  [id number]    DB umbilical hernia  . Eye surgery      cataract surgery both eyes  . Incision and drainage abscess Left 02/28/2013    Procedure: INCISION AND DRAINAGE LEFT ARM ABSCESS;  Surgeon: [name], MD;  Location: [facility];  Service: General;  Laterality: Left;  . Incision and drainage Right 04/02/2013    Procedure: INCISION  AND DRAINAGE RIGHT KNEE HEMATOMA;  Surgeon: [name], MD;  Location: [facility];  Service: Orthopedics;  Laterality: Right;   Family History  Problem Relation Age of Onset  . Stroke Mother   . Stroke Father   . Hypertension Father   . CAD Father 24    MI  . Stroke Sister 51  . Stroke Brother   . Stroke Brother   . Diabetes Brother   . CAD Brother 78    MI  . Cancer Neg Hx   . Dementia Sister 96  . Dementia Father   . Dementia Brother    Social History  Substance Use Topics  . Smoking status: Former Smoker    Quit date: 10/13/1977  . Smokeless tobacco: Former Systems developer    Types: Chew    Quit date: 10/13/1977  . Alcohol Use: No   Current Outpatient Prescriptions  Medication Sig Dispense Refill  . aspirin 81 MG tablet Take 2 tablets (162 mg total) by mouth 2  (two) times daily.    [name] donepezil (ARICEPT) 10 MG tablet Take 10 mg by mouth at bedtime.    [name] doxycycline (VIBRAMYCIN) 100 MG capsule Take 1 capsule (100 mg total) by mouth 2 (two) times daily. Per surgery    . fish oil-omega-3 fatty acids 1000 MG capsule Take 1 g by mouth daily.     . folic acid (FOLVITE) 1 MG tablet Take 1 mg by mouth daily.    . metoprolol tartrate (LOPRESSOR) 25 MG tablet TAKE ONE TABLET BY MOUTH TWICE DAILY 60 tablet 9  . mupirocin cream (BACTROBAN) 2 % Apply 1 application topically 2 (two) times daily. 30 g 1  . nitroGLYCERIN (NITROSTAT) 0.4 MG SL tablet Place 1 tablet (0.4 mg total) under the tongue every 5 (five) minutes as needed for chest pain. 25 tablet 6  . simvastatin (ZOCOR) 40 MG tablet TAKE 1 TABLET BY MOUTH EVERY MORNING 90 tablet 3  . Thiamine HCl (VITAMIN B-1 PO) Take 1 tablet by mouth daily.     . tizanidine (ZANAFLEX) 2 MG capsule Take 1 capsule (2 mg total) by mouth at bedtime as needed for muscle spasms. 30 capsule 2  . warfarin (COUMADIN) 5 MG tablet TAKE AS DIRECTED 40 tablet 3  . zolpidem (AMBIEN) 5 MG tablet Take 1 tablet (5 mg total) by mouth at bedtime as needed for sleep. 30 tablet 0   No current facility-administered medications for this visit.   No Known Allergies   Review of Systems: All systems reviewed and negative except where noted in HPI.   Lab Results  Component Value Date   WBC 9.8 04/02/2013   HGB 14.8 04/02/2013   HCT 43.6 04/02/2013   MCV 87.2 04/02/2013   PLT 194 04/02/2013    Lab Results  Component Value Date   CREATININE 0.78 10/30/2014   BUN 15 10/30/2014   NA 136 10/30/2014   K 4.0 10/30/2014   CL 102 10/30/2014   CO2 29 10/30/2014    Lab Results  Component Value Date   ALT 18 10/30/2014   AST 20 10/30/2014   ALKPHOS 61 10/30/2014   BILITOT 0.9 10/30/2014     Physical Exam: BP 110/56 mmHg  Pulse 72  Ht 5\' 8"  (1.727 m)  Wt 189 lb 6 oz (85.9 kg)  BMI 28.80 kg/m2 Constitutional:  Pleasant,well-developed, male in no acute distress. HEENT: Normocephalic and atraumatic. Conjunctivae are normal. No scleral icterus. Neck supple.  Cardiovascular: Normal rate, mostly normal rhythm with occasional  dropped beat Pulmonary/chest: Effort normal and breath sounds normal. No wheezing, rales or rhonchi. Abdominal: Soft, nondistended, nontender. Bowel sounds active throughout. There are no masses palpable. No hepatomegaly. Extremities: no edema Lymphadenopathy: No cervical adenopathy noted. Neurological: Alert and oriented to person place and time. Skin: Skin is warm and dry. No rashes noted. Psychiatric: Normal mood and affect. Behavior is normal.   ASSESSMENT AND PLAN: 82 y/o male with history of sigmoid colon cancer s/p resection in 2000, with adenoma removed in 2009, presenting for surveillance. He has atrial fibrillation on coumadin as outlined above. He is very functional for his age and generally feels well without limitations. I discussed options with him at this point. If he strongly wishes to have a colonoscopy for surveillance purposes I offered this to him. This would entail holding his coumadin 5 days prior to the procedure, at which time he is at increased risk for stroke, although this risk is thought to be low. The risks of colonoscopy and anesthesia were likewise outlined with him. Given his age, he may not wish to have any further surveillance at all, however in discussion of this option he was anxious about it and wanted to have some sort of screening. I offered him a Cologuard, although I think the likelihood of a positive test is high given his history and would like lead to colonoscopy. He initially wished to proceed with Cologuard, however it's not covered by his insurance and would be quite expensive, which he declined. He also discussed other stool studies. His preference is to proceed with colonoscopy at this time. He is going away on vacation next week and will come  back to schedule when he returns. All questions answered.   Conover Cellar, MD [facility] Gastroenter[facility] Pager [phone]

## 2015-07-30 NOTE — Telephone Encounter (Signed)
Received fax from Cologuard stating that insurance will not cover this test if he has hx of colon cancer. Informed Dr. Havery Moros of fax and he states that we should call patient and let him know and find out if he wants to schedule Colonoscopy or pay out of pocket for this test. Called patient and told him that Cologaurd is not covered and gave him options to pay out of pocket, have the Colonoscopy or discontinue Colonoscopies. Patient states he does not want to pay out of pocket but is unsure if he wants to have Colonoscopy. I did explain that he it as a higher risk of sedation since he is 82 and also on coumadin since he has to stop the coumadin prior to his Colonoscopy. Patient states he would like to think about having this done and call our office back in a month. Informed patient that I will let Dr. Havery Moros know.

## 2015-08-02 ENCOUNTER — Other Ambulatory Visit: Payer: Self-pay | Admitting: Cardiology

## 2015-08-03 ENCOUNTER — Ambulatory Visit (INDEPENDENT_AMBULATORY_CARE_PROVIDER_SITE_OTHER): Payer: Medicare Other | Admitting: *Deleted

## 2015-08-03 DIAGNOSIS — I4891 Unspecified atrial fibrillation: Secondary | ICD-10-CM

## 2015-08-03 DIAGNOSIS — Z5181 Encounter for therapeutic drug level monitoring: Secondary | ICD-10-CM | POA: Diagnosis not present

## 2015-08-03 DIAGNOSIS — I482 Chronic atrial fibrillation, unspecified: Secondary | ICD-10-CM

## 2015-08-03 LAB — POCT INR: INR: 2.1

## 2015-08-03 MED ORDER — WARFARIN SODIUM 5 MG PO TABS: ORAL_TABLET | ORAL | Status: DC

## 2015-08-05 ENCOUNTER — Other Ambulatory Visit: Payer: Self-pay | Admitting: *Deleted

## 2015-08-05 MED ORDER — MUPIROCIN CALCIUM 2 % EX CREA: 1.0000 "application " | TOPICAL_CREAM | Freq: Two times a day (BID) | CUTANEOUS | Status: DC

## 2015-08-05 NOTE — Telephone Encounter (Signed)
Faxed refill request. Last Filled:    30 g 1 02/22/2015  Last office visit:   02/22/15   Please advise.

## 2015-08-05 NOTE — Telephone Encounter (Signed)
Pt left v/m checking on status of mupirocin to [facility]; pt is leaving town 08/06/15 early AM and request refill ASAP.

## 2015-08-05 NOTE — Telephone Encounter (Signed)
Pt notified refill done and pt voiced understanding. 

## 2015-08-17 ENCOUNTER — Encounter: Payer: Self-pay | Admitting: Gastroenterology

## 2015-08-20 ENCOUNTER — Telehealth: Payer: Self-pay

## 2015-08-20 NOTE — Telephone Encounter (Signed)
Left a message for patient to return my call. 

## 2015-08-20 NOTE — Telephone Encounter (Signed)
-----   Message from Marzella Schlein, [place] sent at 08/16/2015  8:59 AM EDT -----   ----- Message -----    From: Marzella Schlein, CMA    Sent: 08/16/2015      To: Marzella Schlein, CMA  Seen phone note from 07/30/15. Patient needs to be set up for Colon [facility] off coumadin.

## 2015-08-25 ENCOUNTER — Telehealth: Payer: Self-pay

## 2015-08-25 NOTE — Telephone Encounter (Signed)
Mr. Blackmon may hold his coumadin for 5 days prior to procedure   .   Restart the day after the procedure if ok with GI

## 2015-08-25 NOTE — Telephone Encounter (Signed)
  08/25/2015   RE: Christian Maxwell DOB: 1932/10/23 MRN: [id number]   Dear Dr. Acie Fredrickson,    We have scheduled the above patient for an endoscopic procedure. Our records show that he is on anticoagulation therapy.   Please advise as to how long the patient may come off his therapy of coumadin prior to the procedure, which we have not scheduled yet.  Please route your answer to Marlon Pel, [place].   Sincerely,    Howard Pouch, MD

## 2015-08-25 NOTE — Telephone Encounter (Signed)
Letter to be mailed to patient to call back and schedule Colonoscopy off coumadin.

## 2015-08-25 NOTE — Telephone Encounter (Signed)
Left another message for patient to return my call. Letter sent to Dr. Acie Fredrickson to ask if patient can come off coumadin prior to procedure that we have not scheduled yet.

## 2015-08-25 NOTE — Telephone Encounter (Signed)
Left message for patient to contact the office regarding scheduling Colonoscopy and informing him of Dr. Elmarie Shiley recommendations. Will send letter to patient to contact our office to schedule at his convenience.

## 2015-08-30 ENCOUNTER — Other Ambulatory Visit: Payer: Self-pay

## 2015-08-30 MED ORDER — ZOLPIDEM TARTRATE 5 MG PO TABS: 5.0000 mg | ORAL_TABLET | Freq: Every evening | ORAL | Status: DC | PRN

## 2015-08-30 NOTE — Telephone Encounter (Signed)
plz phone in. 

## 2015-08-30 NOTE — Telephone Encounter (Signed)
Last filled 06-08-15 #30 Last OV 02-22-15 Next OV 11-11-15

## 2015-08-30 NOTE — Telephone Encounter (Signed)
Gave refill to pahrmacist

## 2015-08-31 ENCOUNTER — Ambulatory Visit (INDEPENDENT_AMBULATORY_CARE_PROVIDER_SITE_OTHER): Payer: Medicare Other

## 2015-08-31 DIAGNOSIS — I482 Chronic atrial fibrillation, unspecified: Secondary | ICD-10-CM

## 2015-08-31 DIAGNOSIS — I4891 Unspecified atrial fibrillation: Secondary | ICD-10-CM | POA: Diagnosis not present

## 2015-08-31 DIAGNOSIS — Z5181 Encounter for therapeutic drug level monitoring: Secondary | ICD-10-CM | POA: Diagnosis not present

## 2015-08-31 LAB — POCT INR: INR: 2

## 2015-09-11 ENCOUNTER — Other Ambulatory Visit: Payer: Self-pay | Admitting: Cardiovascular Disease

## 2015-09-20 ENCOUNTER — Telehealth: Payer: Self-pay

## 2015-09-20 NOTE — Telephone Encounter (Signed)
Pt left v/m requesting to change zolpidem from 5 mg back to 10 mg. Pt last got zolpidem 5 mg # 30 on 08/20/15.Pt going out of town for 2 weeks on 09/21/15 and request zolpidem 10 mg sent to [facility] today. Pt last seen annual 11/06/14.Pt request cb when refilled.

## 2015-09-20 NOTE — Telephone Encounter (Signed)
Pt left v/m requesting med be called in today so pt can pick up in early AM. Pt request cb.

## 2015-09-20 NOTE — Telephone Encounter (Signed)
Notified patient I'd call this in. When I went to phone in, was notified by pharmacy that this was phoned in by Dr Reynold Bowen of [place] today. Will discuss with patient at f/u.

## 2015-10-18 ENCOUNTER — Encounter: Payer: Medicare Other | Admitting: Gastroenterology

## 2015-10-19 ENCOUNTER — Ambulatory Visit (INDEPENDENT_AMBULATORY_CARE_PROVIDER_SITE_OTHER): Payer: Medicare Other | Admitting: *Deleted

## 2015-10-19 DIAGNOSIS — I482 Chronic atrial fibrillation, unspecified: Secondary | ICD-10-CM

## 2015-10-19 DIAGNOSIS — I4891 Unspecified atrial fibrillation: Secondary | ICD-10-CM | POA: Diagnosis not present

## 2015-10-19 DIAGNOSIS — Z5181 Encounter for therapeutic drug level monitoring: Secondary | ICD-10-CM | POA: Diagnosis not present

## 2015-10-19 LAB — POCT INR: INR: 2

## 2015-10-26 ENCOUNTER — Encounter: Payer: Self-pay | Admitting: *Deleted

## 2015-10-26 ENCOUNTER — Ambulatory Visit (AMBULATORY_SURGERY_CENTER): Payer: Self-pay | Admitting: *Deleted

## 2015-10-26 VITALS — Ht 70.0 in | Wt 188.0 lb

## 2015-10-26 DIAGNOSIS — Z85038 Personal history of other malignant neoplasm of large intestine: Secondary | ICD-10-CM

## 2015-10-26 MED ORDER — NA SULFATE-K SULFATE-MG SULF 17.5-3.13-1.6 GM/177ML PO SOLN: 1.0000 | Freq: Once | ORAL | Status: DC

## 2015-10-26 NOTE — Progress Notes (Signed)
No egg or soy allergy known to patient  No issues with past sedation with any surgeries  or procedures, no intubation problems  No diet pills per patient No home 02 use per patient  On coumadin  blood thinners per patient - order per Dr Acie Fredrickson ok to hold x 5 days before colon - see TE  Pt denies issues with constipation  Pt has a movi prep at home ( Brought to [facility] )  So will use since paid for it - movi prep instructions given - called [facility] and cancelled suprep sent in

## 2015-10-30 ENCOUNTER — Telehealth: Payer: Self-pay | Admitting: Family Medicine

## 2015-10-30 NOTE — Telephone Encounter (Signed)
LM for pt to sch AWV with Lesia on 6/23 at 9 along with his already sch lab appt. mn

## 2015-11-03 ENCOUNTER — Telehealth: Payer: Self-pay | Admitting: Cardiovascular Disease

## 2015-11-03 ENCOUNTER — Telehealth: Payer: Self-pay | Admitting: Gastroenterology

## 2015-11-03 ENCOUNTER — Other Ambulatory Visit: Payer: Self-pay | Admitting: Family Medicine

## 2015-11-03 DIAGNOSIS — E785 Hyperlipidemia, unspecified: Secondary | ICD-10-CM

## 2015-11-03 DIAGNOSIS — E079 Disorder of thyroid, unspecified: Secondary | ICD-10-CM

## 2015-11-03 NOTE — Telephone Encounter (Signed)
Forwarding message to [facility] for advice

## 2015-11-03 NOTE — Telephone Encounter (Signed)
Okay thanks for the update 

## 2015-11-03 NOTE — Telephone Encounter (Signed)
Spoke with patient's wife and she states the area is getting larger. She wants to cancel the procedure tomorrow. She will call back to reschedule. He is going to his PCP for evaluation.

## 2015-11-03 NOTE — Telephone Encounter (Signed)
New message   Pt wife is calling because the pt surgeon candled the procedure and the pt needs to go back on coumadin and she wants to know how much to give him

## 2015-11-03 NOTE — Telephone Encounter (Signed)
Patient wife calling back on this.

## 2015-11-03 NOTE — Telephone Encounter (Signed)
Called patient and spoke to his wife and she states that the injury is getting larger and she would like to call the doctor and get this taken care of first.  She would like to cancel the colonoscopy tomorrow and reschedule when this has been completely taken care of.  I advised her to make sure she ask the doctor when to start back taking his anticoagulant.  She agreed and will call back to reschedule procedure.  All questions were answered.

## 2015-11-03 NOTE — Telephone Encounter (Signed)
Returned call to the pt & wife & she stated that the pt colonoscopy was canceled today.  Instructed to have pt resume normal dose of Coumadin and take an extra 1/2 tablet today and tomorrow.  Also, instructed them to call back with any issues & she verbalized understanding.  Appt was set over the phone by pt's wife.

## 2015-11-03 NOTE — Telephone Encounter (Signed)
He may need to be seen by his PCM to evaluate this today. Sounds like he has an effusion from the trauma. Any fevers with this, pain, or evidence of infection? I would think he could proceed with colonoscopy unless he has infection in the joint, especially if he has been off the blood thinner

## 2015-11-03 NOTE — Telephone Encounter (Signed)
Dr. Havery Moros please advise on what to do and tell patient.  Thank you.

## 2015-11-04 ENCOUNTER — Encounter: Payer: Medicare Other | Admitting: Gastroenterology

## 2015-11-04 DIAGNOSIS — M7022 Olecranon bursitis, left elbow: Secondary | ICD-10-CM | POA: Diagnosis not present

## 2015-11-05 ENCOUNTER — Other Ambulatory Visit (INDEPENDENT_AMBULATORY_CARE_PROVIDER_SITE_OTHER): Payer: Medicare Other

## 2015-11-05 ENCOUNTER — Ambulatory Visit (INDEPENDENT_AMBULATORY_CARE_PROVIDER_SITE_OTHER): Payer: Medicare Other

## 2015-11-05 VITALS — BP 102/60 | HR 60 | Temp 97.6°F | Ht 67.5 in | Wt 184.0 lb

## 2015-11-05 DIAGNOSIS — E785 Hyperlipidemia, unspecified: Secondary | ICD-10-CM | POA: Diagnosis not present

## 2015-11-05 DIAGNOSIS — Z0000 Encounter for general adult medical examination without abnormal findings: Secondary | ICD-10-CM

## 2015-11-05 DIAGNOSIS — Z23 Encounter for immunization: Secondary | ICD-10-CM | POA: Diagnosis not present

## 2015-11-05 DIAGNOSIS — E079 Disorder of thyroid, unspecified: Secondary | ICD-10-CM | POA: Diagnosis not present

## 2015-11-05 LAB — BASIC METABOLIC PANEL
BUN: 17 mg/dL (ref 6–23)
CALCIUM: 10.3 mg/dL (ref 8.4–10.5)
CO2: 30 mEq/L (ref 19–32)
Chloride: 103 mEq/L (ref 96–112)
Creatinine, Ser: 0.86 mg/dL (ref 0.40–1.50)
GFR: 90.72 mL/min (ref >60.00)
Glucose, Bld: 103 mg/dL — ABNORMAL HIGH (ref 70–99)
POTASSIUM: 4 meq/L (ref 3.5–5.1)
SODIUM: 138 meq/L (ref 135–145)

## 2015-11-05 LAB — LIPID PANEL
Cholesterol: 144 mg/dL (ref 0–200)
HDL: 40.9 mg/dL (ref >39.00)
LDL Cholesterol: 66 mg/dL (ref 0–99)
NONHDL: 102.95
Total CHOL/HDL Ratio: 4
Triglycerides: 183 mg/dL — ABNORMAL HIGH (ref 0.0–149.0)
VLDL: 36.6 mg/dL (ref 0.0–40.0)

## 2015-11-05 LAB — TSH: TSH: 1.66 u[IU]/mL (ref 0.35–4.50)

## 2015-11-05 NOTE — Patient Instructions (Signed)
Christian Maxwell , Thank you for taking time to come for your Medicare Wellness Visit. I appreciate your ongoing commitment to your health goals. Please review the following plan we discussed and let me know if I can assist you in the future.   These are the goals we discussed: Goals    . Increase physical activity     Starting 11/05/2015, I will continue to ride stationary bike for at least 40 min daily.        This is a list of the screening recommended for you and due dates:  Health Maintenance  Topic Date Due  . Shingles Vaccine  11/04/2016*  . Tetanus Vaccine  11/04/2016*  . Flu Shot  12/14/2015  . Pneumonia vaccines (2 of 2 - PPSV23) 11/04/2016  . DTaP/Tdap/Td vaccine  Completed  *Topic was postponed. The date shown is not the original due date.    Preventive Care for Adults  A healthy lifestyle and preventive care can promote health and wellness. Preventive health guidelines for adults include the following key practices.  . A routine yearly physical is a good way to check with your health care provider about your health and preventive screening. It is a chance to share any concerns and updates on your health and to receive a thorough exam.  . Visit your dentist for a routine exam and preventive care every 6 months. Brush your teeth twice a day and floss once a day. Good oral hygiene prevents tooth decay and gum disease.  . The frequency of eye exams is based on your age, health, family medical history, use  of contact lenses, and other factors. Follow your health care provider's ecommendations for frequency of eye exams.  . Eat a healthy diet. Foods like vegetables, fruits, whole grains, low-fat dairy products, and lean protein foods contain the nutrients you need without too many calories. Decrease your intake of foods high in solid fats, added sugars, and salt. Eat the right amount of calories for you. Get information about a proper diet from your health care provider, if  necessary.  . Regular physical exercise is one of the most important things you can do for your health. Most adults should get at least 150 minutes of moderate-intensity exercise (any activity that increases your heart rate and causes you to sweat) each week. In addition, most adults need muscle-strengthening exercises on 2 or more days a week.  Silver Sneakers may be a benefit available to you. To determine eligibility, you may visit the website: www.silversneakers.com or contact program at 608-463-9385 Mon-Fri between 8AM-8PM.   . Maintain a healthy weight. The body mass index (BMI) is a screening tool to identify possible weight problems. It provides an estimate of body fat based on height and weight. Your health care provider can find your BMI and can help you achieve or maintain a healthy weight.   For adults 20 years and older: ? A BMI below 18.5 is considered underweight. ? A BMI of 18.5 to 24.9 is normal. ? A BMI of 25 to 29.9 is considered overweight. ? A BMI of 30 and above is considered obese.   . Maintain normal blood lipids and cholesterol levels by exercising and minimizing your intake of saturated fat. Eat a balanced diet with plenty of fruit and vegetables. Blood tests for lipids and cholesterol should begin at age 6 and be repeated every 5 years. If your lipid or cholesterol levels are high, you are over 50, or you are at high risk  for heart disease, you may need your cholesterol levels checked more frequently. Ongoing high lipid and cholesterol levels should be treated with medicines if diet and exercise are not working.  . If you smoke, find out from your health care provider how to quit. If you do not use tobacco, please do not start.  . If you choose to drink alcohol, please do not consume more than 2 drinks per day. One drink is considered to be 12 ounces (355 mL) of beer, 5 ounces (148 mL) of wine, or 1.5 ounces (44 mL) of liquor.  . If you are 60-18 years old, ask your  health care provider if you should take aspirin to prevent strokes.  . Use sunscreen. Apply sunscreen liberally and repeatedly throughout the day. You should seek shade when your shadow is shorter than you. Protect yourself by wearing long sleeves, pants, a wide-brimmed hat, and sunglasses year round, whenever you are outdoors.  . Once a month, do a whole body skin exam, using a mirror to look at the skin on your back. Tell your health care provider of new moles, moles that have irregular borders, moles that are larger than a pencil eraser, or moles that have changed in shape or color.

## 2015-11-05 NOTE — Progress Notes (Signed)
Subjective:   Christian Maxwell is a 82 y.o. male who presents for Medicare Annual/Subsequent preventive examination.  Review of Systems:  N/A Cardiac Risk Factors include: advanced age (>26mn, >>10women);male gender     Objective:    Vitals: BP 102/60 mmHg  Pulse 60  Temp(Src) 97.6 F (36.4 C) (Oral)  Ht 5' 7.5" (1.715 m)  Wt 184 lb (83.462 kg)  BMI 28.38 kg/m2  SpO2 98%  Body mass index is 28.38 kg/(m^2).  Tobacco History  Smoking status  . Former Smoker  . Quit date: 10/13/1977  Smokeless tobacco  . Former USystems developer . Types: Chew  . Quit date: 10/13/1977     Counseling given: No   Past Medical History  Diagnosis Date  . Hyperlipidemia   . Syncope and collapse     pt denies  . Chronic atrial fibrillation ([facility]   . PVC's (premature ventricular contractions)   . Abscess 7/13    abd abscess  . History of colon cancer 2000    T3N0 s/p colectomy  . History of stress test     a. Cardiolite in 9/04: EF 67%, no ischemia.;  b. Myoview 6/16: no ischemia   . METHICILLIN RESISTANT STAPHYLOCOCCUS AUREUS INFECTION 12/12/2006    Annotation: stitch abcess in lower abdomen after colon cancer resection Qualifier: Diagnosis of  By: HJohnnye SimaMD, JDellis Filbert   . Postoperative stitch abscess 07/19/2011  . Hx of adenomatous colonic polyps   . Thyroid disease     followed by Dr. SForde Dandy . Hypertension   . History of echocardiogram     a. Echo 2/13:  Mild LVH, EF 55-60%, mild MR, moderate LAE, mild to moderate RAE;  b. Echo 1/17: mild LVH, vigorous LVF, EF 65-70%, no RWMA, trivial AI, trivial MR, severe LAE, mild RAE, mod TR, PASP 32 mmHg  . [place] [employer] [place]  . Cancer ([facility]     colon cancer   Past Surgical History  Procedure Laterality Date  . Lumbar laminectomy  1977  . Wound exploration  2006    For possible stitch abscess Dr TGrandville Silos . Incise and drain abcess  2009,2010,2011,2012  . Colectomy  2000    6 inches removed  . Mass excision  03/07/2012    EXCISION  MASS;  Surgeon: BZenovia Jarred MD;  Laterality: Right;  removal mass posterior right arm  . Polypectomy    . Colonoscopy  2009    polyp, h/o cancer ([name]  . Incision and drainage abscess N/A 09/20/2012    Procedure: INCISION AND DRAINAGE SUPRAPUBIC ABSCESS;  Surgeon: BZenovia Jarred MD;  [place]  Service: General;  Laterality: N/A;  . Hernia repair  51/3244   DB umbilical hernia  . Eye surgery      cataract surgery both eyes- pts ates he has never had cataract surgery 10-26-15  . Incision and drainage abscess Left 02/28/2013    Procedure: INCISION AND DRAINAGE LEFT ARM ABSCESS;  Surgeon: ARalene Ok MD;  [place]  Service: General;  Laterality: Left;  . Incision and drainage Right 04/02/2013    Procedure: INCISION AND DRAINAGE RIGHT KNEE HEMATOMA;  Surgeon: CMcarthur Rossetti MD;  [place]  Service: Orthopedics;  Laterality: Right;   Family History  Problem Relation Age of Onset  . Stroke Mother   . Stroke Father   . Hypertension Father   . CAD Father 779   MI  . Dementia Father   .  Stroke Sister 46  . Stroke Brother   . Stroke Brother   . Diabetes Brother   . CAD Brother 21    MI  . Cancer Neg Hx   . Colon polyps Neg Hx   . Rectal cancer Neg Hx   . Stomach cancer Neg Hx   . Dementia Sister 23  . Dementia Brother   . Colon cancer Cousin     x4 total with colon cancer   History  Sexual Activity  . Sexual Activity: No    Outpatient Encounter Prescriptions as of 11/05/2015  Medication Sig  . aspirin 81 MG tablet Take 2 tablets (162 mg total) by mouth 2 (two) times daily.  [name] donepezil (ARICEPT) 10 MG tablet Take 10 mg by mouth at bedtime.  . fish oil-omega-3 fatty acids 1000 MG capsule Take 1 g by mouth daily.   . folic acid (FOLVITE) 1 MG tablet Take 1 mg by mouth daily.  . metoprolol tartrate (LOPRESSOR) 25 MG tablet TAKE ONE TABLET BY MOUTH TWICE DAILY  . mupirocin cream (BACTROBAN) 2 % Apply 1 application  topically 2 (two) times daily.  . nitroGLYCERIN (NITROSTAT) 0.4 MG SL tablet Place 1 tablet (0.4 mg total) under the tongue every 5 (five) minutes as needed for chest pain.  . peg 3350 powder (MOVIPREP) 100 g SOLR Take 1 kit by mouth. 1 kit for colon 6-22  . simvastatin (ZOCOR) 40 MG tablet TAKE 1 TABLET BY MOUTH EVERY MORNING  . Thiamine HCl (VITAMIN B-1 PO) Take 1 tablet by mouth daily.   . tizanidine (ZANAFLEX) 2 MG capsule Take 1 capsule (2 mg total) by mouth at bedtime as needed for muscle spasms.  [name] warfarin (COUMADIN) 5 MG tablet TAKE AS DIRECTED BY COUMADIN CLINIC  . zolpidem (AMBIEN) 10 MG tablet Take 1 tablet (10 mg total) by mouth at bedtime as needed for sleep.   No facility-administered encounter medications on file as of 11/05/2015.    Activities of Daily Living In your present state of health, do you have any difficulty performing the following activities: 11/05/2015  Hearing? N  Vision? N  Difficulty concentrating or making decisions? N  Walking or climbing stairs? N  Dressing or bathing? N  Doing errands, shopping? N  Preparing Food and eating ? N  Using the Toilet? N  In the past six months, have you accidently leaked urine? N  Do you have problems with loss of bowel control? N  Managing your Medications? N  Managing your Finances? N  Housekeeping or managing your Housekeeping? N    Patient Care Team: Ria Bush, MD as PCP - General (Family Medicine) Georganna Skeans, MD as Consulting Physician (General Surgery)   Assessment:     Hearing Screening   _0  _1  _2  _3  _4  _5  _6   Right ear:   40 0 0 0   Left ear:   40 0 0 0     Visual Acuity Screening   Right eye Left eye Both eyes  Without correction:     With correction: _7    Exercise Activities and Dietary recommendations Current Exercise Habits: Home exercise routine, Type of exercise: Other - see comments (stationary bike), Time (Minutes): 40, Frequency  (Times/Week): 7, Weekly Exercise (Minutes/Week): 280, Intensity: Moderate, Exercise limited by: None identified  Goals    . Increase physical activity     Starting 11/05/2015, I will continue to ride stationary bike for at least 40 min daily.  Fall Risk Fall Risk  11/05/2015 11/06/2014 12/12/2012  Falls in the past year? No No No   Depression Screen PHQ 2/9 Scores 11/05/2015 11/06/2014 12/12/2012  PHQ - 2 Score 0 0 0    Cognitive Testing MMSE - Mini Mental State Exam 11/05/2015  Orientation to time 5  Orientation to Place 5  Registration 3  Attention/ Calculation 0  Recall 3  Language- name 2 objects 0  Language- repeat 1  Language- follow 3 step command 3  Language- read & follow direction 0  Write a sentence 0  Copy design 0  Total score 20   PLEASE NOTE: A Mini-Cog screen was completed. Maximum score is 20. A value of 0 denotes this part of Folstein MMSE was not completed or the patient failed this part of the Mini-Cog screening.   Mini-Cog Screening Orientation to Time - Max 5 pts Orientation to Place - Max 5 pts Registration - Max 3 pts Recall - Max 3 pts Language Repeat - Max 1 pts Language Follow 3 Step Command - Max 3 pts  Immunization History  Administered Date(s) Administered  . Influenza, High Dose Seasonal PF 02/27/2014  . Influenza,inj,Quad PF,36+ Mos 03/06/2013, 02/22/2015   Screening Tests Health Maintenance  Topic Date Due  . ZOSTAVAX  11/04/2016 (Originally 11/10/1994)  . TETANUS/TDAP  11/04/2016 (Originally 11/09/1951)  . INFLUENZA VACCINE  12/14/2015  . PNA vac Low Risk Adult (2 of 2 - PPSV23) 11/04/2016  . DTaP/Tdap/Td  Completed      Plan:     I have personally reviewed and addressed the Medicare Annual Wellness questionnaire and have noted the following in the patient's chart:  A. Medical and social history B. Use of alcohol, tobacco or illicit drugs  C. Current medications and supplements D. Functional ability and status E.    Nutritional status F.  Physical activity G. Advance directives H. List of other physicians I.  Hospitalizations, surgeries, and ER visits in previous 12 months J.  [place] to include hearing, vision, cognitive, depression L. Referrals and appointments - none  In addition, I have reviewed and discussed with patient certain preventive protocols, quality metrics, and best practice recommendations. A written personalized care plan for preventive services as well as general preventive health recommendations were provided to patient.  See attached scanned questionnaire for additional information.   Signed,   Lindell Noe, MHA, BS, LPN Health Advisor

## 2015-11-05 NOTE — Progress Notes (Signed)
Pre visit review using our clinic review tool, if applicable. No additional management support is needed unless otherwise documented below in the visit note. 

## 2015-11-05 NOTE — Progress Notes (Signed)
PCP notes  Health maintenance:   Shingles - postponed/insurance Tetanus - postponed/insurance PCV13 - administered  Abnormal screenings:   Hearing - failed  Patient concerns: None  Nurse concerns: None  Next PCP appt: 11/11/2015 @ 1030

## 2015-11-07 NOTE — Progress Notes (Signed)
I reviewed health advisor's note, was available for consultation, and agree with documentation and plan.  

## 2015-11-09 DIAGNOSIS — D2261 Melanocytic nevi of right upper limb, including shoulder: Secondary | ICD-10-CM | POA: Diagnosis not present

## 2015-11-09 DIAGNOSIS — X32XXXA Exposure to sunlight, initial encounter: Secondary | ICD-10-CM | POA: Diagnosis not present

## 2015-11-09 DIAGNOSIS — D0359 Melanoma in situ of other part of trunk: Secondary | ICD-10-CM | POA: Diagnosis not present

## 2015-11-09 DIAGNOSIS — D2262 Melanocytic nevi of left upper limb, including shoulder: Secondary | ICD-10-CM | POA: Diagnosis not present

## 2015-11-09 DIAGNOSIS — L570 Actinic keratosis: Secondary | ICD-10-CM | POA: Diagnosis not present

## 2015-11-09 DIAGNOSIS — D225 Melanocytic nevi of trunk: Secondary | ICD-10-CM | POA: Diagnosis not present

## 2015-11-09 DIAGNOSIS — D485 Neoplasm of uncertain behavior of skin: Secondary | ICD-10-CM | POA: Diagnosis not present

## 2015-11-09 DIAGNOSIS — L821 Other seborrheic keratosis: Secondary | ICD-10-CM | POA: Diagnosis not present

## 2015-11-10 ENCOUNTER — Ambulatory Visit (INDEPENDENT_AMBULATORY_CARE_PROVIDER_SITE_OTHER): Payer: Medicare Other | Admitting: *Deleted

## 2015-11-10 DIAGNOSIS — Z5181 Encounter for therapeutic drug level monitoring: Secondary | ICD-10-CM | POA: Diagnosis not present

## 2015-11-10 DIAGNOSIS — M7022 Olecranon bursitis, left elbow: Secondary | ICD-10-CM | POA: Diagnosis not present

## 2015-11-10 DIAGNOSIS — I482 Chronic atrial fibrillation, unspecified: Secondary | ICD-10-CM

## 2015-11-10 DIAGNOSIS — I4891 Unspecified atrial fibrillation: Secondary | ICD-10-CM | POA: Diagnosis not present

## 2015-11-10 DIAGNOSIS — L02211 Cutaneous abscess of abdominal wall: Secondary | ICD-10-CM | POA: Diagnosis not present

## 2015-11-10 LAB — POCT INR: INR: 1.7

## 2015-11-11 ENCOUNTER — Ambulatory Visit (INDEPENDENT_AMBULATORY_CARE_PROVIDER_SITE_OTHER): Payer: Medicare Other | Admitting: Family Medicine

## 2015-11-11 ENCOUNTER — Encounter: Payer: Self-pay | Admitting: Family Medicine

## 2015-11-11 VITALS — BP 100/68 | HR 59 | Temp 97.9°F | Ht 67.5 in | Wt 184.0 lb

## 2015-11-11 DIAGNOSIS — C439 Malignant melanoma of skin, unspecified: Secondary | ICD-10-CM | POA: Diagnosis not present

## 2015-11-11 DIAGNOSIS — E079 Disorder of thyroid, unspecified: Secondary | ICD-10-CM | POA: Diagnosis not present

## 2015-11-11 DIAGNOSIS — Z85038 Personal history of other malignant neoplasm of large intestine: Secondary | ICD-10-CM

## 2015-11-11 DIAGNOSIS — C189 Malignant neoplasm of colon, unspecified: Secondary | ICD-10-CM | POA: Diagnosis not present

## 2015-11-11 DIAGNOSIS — R740 Nonspecific elevation of levels of transaminase and lactic acid dehydrogenase [LDH]: Secondary | ICD-10-CM | POA: Diagnosis not present

## 2015-11-11 DIAGNOSIS — E784 Other hyperlipidemia: Secondary | ICD-10-CM | POA: Diagnosis not present

## 2015-11-11 DIAGNOSIS — I480 Paroxysmal atrial fibrillation: Secondary | ICD-10-CM | POA: Diagnosis not present

## 2015-11-11 DIAGNOSIS — I482 Chronic atrial fibrillation, unspecified: Secondary | ICD-10-CM

## 2015-11-11 DIAGNOSIS — Z7189 Other specified counseling: Secondary | ICD-10-CM

## 2015-11-11 DIAGNOSIS — M7022 Olecranon bursitis, left elbow: Secondary | ICD-10-CM

## 2015-11-11 DIAGNOSIS — F0280 Dementia in other diseases classified elsewhere without behavioral disturbance: Secondary | ICD-10-CM | POA: Diagnosis not present

## 2015-11-11 DIAGNOSIS — E785 Hyperlipidemia, unspecified: Secondary | ICD-10-CM | POA: Diagnosis not present

## 2015-11-11 DIAGNOSIS — Z1389 Encounter for screening for other disorder: Secondary | ICD-10-CM | POA: Diagnosis not present

## 2015-11-11 DIAGNOSIS — H9193 Unspecified hearing loss, bilateral: Secondary | ICD-10-CM

## 2015-11-11 DIAGNOSIS — Z6826 Body mass index (BMI) 26.0-26.9, adult: Secondary | ICD-10-CM | POA: Diagnosis not present

## 2015-11-11 DIAGNOSIS — E0500 Thyrotoxicosis with diffuse goiter without thyrotoxic crisis or storm: Secondary | ICD-10-CM | POA: Diagnosis not present

## 2015-11-11 DIAGNOSIS — H9190 Unspecified hearing loss, unspecified ear: Secondary | ICD-10-CM | POA: Insufficient documentation

## 2015-11-11 NOTE — Assessment & Plan Note (Addendum)
Appreciate cards care of patient. Continue aspirin, coumadin, beta blocker daily.

## 2015-11-11 NOTE — Progress Notes (Signed)
BP 100/68 mmHg  Pulse 59  Temp(Src) 97.9 F (36.6 C)  Ht 5' 7.5" (1.715 m)  Wt 184 lb (83.462 kg)  BMI 28.38 kg/m2  SpO2 96%   CC: f/u visit, L elbow pain Subjective:    Patient ID: Christian Maxwell, male    DOB: 1933/03/19, 82 y.o.   MRN: [id number]  HPI: Christian Maxwell is a 82 y.o. male presenting on 11/11/2015 for Medicare Wellness   Saw Katha Cabal last week for medicare wellness visit. Note reviewed. Failed hearing screen. Denies hearing difficulty, not interested in further evaluation at this time. Received prevnar.   On metoprolol low dose for atrial fibrillation history. Denies dizziness, lightheadedness.   L elbow bursa started swelling a few weeks, denies inciting trauma/injury. Planning on seeing ortho for further evaluation. Declines further intervention today.   He cancelled colonoscopy - didn't think he needed this.   Preventative: Colon cancer screening - h/o stage I colon cancer s/p segmental colectomy 2000. Latest COLONOSCOPY Date: 2009 adenomatous polyp (Dr. [name]). Pt decided to decline further colon cancer screening.  Prostate cancer screening - aged out Flu shot - yearly Tetanus shot - declines prevnar 2017.  Shingles shot - declines Advanced directive discussion - has at home. Wife is HCPOA. Again asked to bring me copy.  Seat belt use discussed Sunscreen use discussed, no changing moles on skin.  Caffeine: none EtOH: none Lives with wife (for 61yr). Grown daughters x2 Occupation: retired, had iHuman resources officerActivity: stationary bike twice daily Diet: fruits/vegetables daily, good water  Relevant past medical, surgical, family and social history reviewed and updated as indicated. Interim medical history since our last visit reviewed. Allergies and medications reviewed and updated. Current Outpatient Prescriptions on File Prior to Visit  Medication Sig  . aspirin 81 MG tablet Take 2 tablets (162 mg total) by mouth 2 (two) times daily.  .Marland Kitchendonepezil  (ARICEPT) 10 MG tablet Take 10 mg by mouth at bedtime.  . fish oil-omega-3 fatty acids 1000 MG capsule Take 1 g by mouth daily.   . folic acid (FOLVITE) 1 MG tablet Take 1 mg by mouth daily.  . metoprolol tartrate (LOPRESSOR) 25 MG tablet TAKE ONE TABLET BY MOUTH TWICE DAILY  . mupirocin cream (BACTROBAN) 2 % Apply 1 application topically 2 (two) times daily.  . nitroGLYCERIN (NITROSTAT) 0.4 MG SL tablet Place 1 tablet (0.4 mg total) under the tongue every 5 (five) minutes as needed for chest pain.  . peg 3350 powder (MOVIPREP) 100 g SOLR Take 1 kit by mouth. 1 kit for colon 6-22  . simvastatin (ZOCOR) 40 MG tablet TAKE 1 TABLET BY MOUTH EVERY MORNING  . Thiamine HCl (VITAMIN B-1 PO) Take 1 tablet by mouth daily.   . tizanidine (ZANAFLEX) 2 MG capsule Take 1 capsule (2 mg total) by mouth at bedtime as needed for muscle spasms.  .Marland Kitchenwarfarin (COUMADIN) 5 MG tablet TAKE AS DIRECTED BY COUMADIN CLINIC  . zolpidem (AMBIEN) 10 MG tablet Take 1 tablet (10 mg total) by mouth at bedtime as needed for sleep.   No current facility-administered medications on file prior to visit.    Review of Systems Per HPI unless specifically indicated in ROS section     Objective:    BP 100/68 mmHg  Pulse 59  Temp(Src) 97.9 F (36.6 C)  Ht 5' 7.5" (1.715 m)  Wt 184 lb (83.462 kg)  BMI 28.38 kg/m2  SpO2 96%  Wt Readings from Last 3 Encounters:  11/11/15 184 lb (  83.462 kg)  11/05/15 184 lb (83.462 kg)  10/26/15 188 lb (85.276 kg)    Physical Exam  Constitutional: He appears well-developed and well-nourished. No distress.  HENT:  Right Ear: External ear normal. Decreased hearing is noted.  Left Ear: External ear normal. No decreased hearing is noted.  Bilateral cerumen impaction - declines irrigation today.  Cardiovascular: Normal rate, regular rhythm, normal heart sounds and intact distal pulses.   No murmur heard. Pulmonary/Chest: Effort normal and breath sounds normal. No respiratory distress. He has  no wheezes. He has no rales.  Musculoskeletal: He exhibits no edema.  L olecranon bursal swelling present without pain/erythema  Skin: Skin is warm and dry. No rash noted.  Nursing note and vitals reviewed.  Results for orders placed or performed in visit on 11/10/15  POCT INR  Result Value Ref Range   INR 1.7       Assessment & Plan:   Problem List Items Addressed This Visit    Atrial fibrillation [facility])    Appreciate cards care of patient. Continue aspirin, coumadin, beta blocker daily.      Hyperlipidemia - Primary    Chronic, stable. Continue fish oil and simvastatin. Discussed dietary interventions to improve triglyceride levels.       H/O colon cancer, stage I    S/p segmental colectomy 2000. Latest colonoscopy 2009 with tubular adenoma. Declines further screening at this time.       Thyroid disease    TSH stable off replacement. Prior saw Dr Forde Dandy endo      Advanced care planning/counseling discussion    Advanced directive discussion - has at home. Wife is HCPOA. Again asked to bring me copy.       Hearing impairment    With bilateral cerumen impaction, pt declines further evaluation/intervention at this time.      Olecranon bursitis of left elbow    No signs of infectious cause today - pt desires to f/u with ortho and will call to schedule appointment.           Follow up plan: Return if symptoms worsen or fail to improve.  Ria Bush, MD

## 2015-11-11 NOTE — Assessment & Plan Note (Signed)
Chronic, stable. Continue fish oil and simvastatin. Discussed dietary interventions to improve triglyceride levels.

## 2015-11-11 NOTE — Assessment & Plan Note (Signed)
S/p segmental colectomy 2000. Latest colonoscopy 2009 with tubular adenoma. Declines further screening at this time.

## 2015-11-11 NOTE — Assessment & Plan Note (Addendum)
TSH stable off replacement. Prior saw Dr Forde Dandy endo

## 2015-11-11 NOTE — Assessment & Plan Note (Signed)
Advanced directive discussion - has at home. Wife is HCPOA. Again asked to bring me copy.

## 2015-11-11 NOTE — Assessment & Plan Note (Signed)
With bilateral cerumen impaction, pt declines further evaluation/intervention at this time.

## 2015-11-11 NOTE — Patient Instructions (Addendum)
Bring me copy of living will/advanced directive to update your chart.  You are doing well today, continue medicines. Try to increase fatty fish in diet to improve triglyceride levels.

## 2015-11-11 NOTE — Assessment & Plan Note (Signed)
No signs of infectious cause today - pt desires to f/u with ortho and will call to schedule appointment.

## 2015-11-11 NOTE — Progress Notes (Signed)
Pre visit review using our clinic review tool, if applicable. No additional management support is needed unless otherwise documented below in the visit note. 

## 2015-12-09 DIAGNOSIS — M7022 Olecranon bursitis, left elbow: Secondary | ICD-10-CM | POA: Diagnosis not present

## 2015-12-29 ENCOUNTER — Ambulatory Visit: Payer: Medicare Other | Admitting: Cardiovascular Disease

## 2015-12-29 DIAGNOSIS — D0359 Melanoma in situ of other part of trunk: Secondary | ICD-10-CM | POA: Diagnosis not present

## 2015-12-29 DIAGNOSIS — L905 Scar conditions and fibrosis of skin: Secondary | ICD-10-CM | POA: Diagnosis not present

## 2016-01-10 ENCOUNTER — Ambulatory Visit: Payer: Medicare Other | Admitting: Physician Assistant

## 2016-01-13 NOTE — Progress Notes (Signed)
Cardiology Office Note:    Date:  01/14/2016   ID:  Christian Maxwell, DOB [birth year], MRN [id number]  PCP:  Ria Bush, MD  Cardiologist:  Dr. Liam Rogers   Electrophysiologist:  n/a  Referring MD: Ria Bush, MD   Chief Complaint  Patient presents with  . Follow-up    Atrial fibrillation    History of Present Illness:    Christian Maxwell is a 82 y.o. male with a hx of permanent AFib, HTN, HL, LBBB, hypothyroidism, colon CA. Lexiscan Myoview in 6/16 was negative for ischemia. Echocardiogram in 1/17 demonstrated normal LV function. Last seen by Dr. Acie Fredrickson 2/17. Returns for follow-up.  He is here today with his wife. She helps with the history as well. The patient denies chest discomfort. However, she notes that he has chest discomfort that begins in the mornings and evenings prior to his metoprolol dose. He agrees with this. This has been a symptom for the past 2 years. He had a normal nuclear stress test last year.He had a normal Echo earlier this year.  He does note some fatigue and dyspnea with more mod activities.  He denies syncope, orthopnea, PND, edema.  He denies any melena, hematochezia. He recently had a melanoma removed from his R upper chest.   Prior CV studies that were reviewed today include:    Echo 06/14/15 Mild LVH, EF 65-70%, normal wall motion, trivial AI/MR, severe LAE, mild RAE, moderate TR, PASP 32 mmHg  Echo 06/2011:  Mild LVH, EF 55-60%, mild MR, moderate LAE, mild to moderate RAE.  Myoview 11/02/14 No ischemia.  Not gated  Past Medical History:  Diagnosis Date  . Abscess 7/13   abd abscess  . Cancer [facility])    colon cancer  . Chronic atrial fibrillation ([place])   . History of colon cancer 2000   T3N0 s/p colectomy  . History of echocardiogram    a. Echo 2/13:  Mild LVH, EF 55-60%, mild MR, moderate LAE, mild to moderate RAE;  b. Echo 1/17: mild LVH, vigorous LVF, EF 65-70%, no RWMA, trivial AI, trivial MR, severe LAE, mild RAE, mod TR, PASP  32 mmHg  . History of stress test    a. Cardiolite in 9/04: EF 67%, no ischemia.;  b. Myoview 6/16: no ischemia   . Hx of adenomatous colonic polyps   . Hyperlipidemia   . Hypertension   . METHICILLIN RESISTANT STAPHYLOCOCCUS AUREUS INFECTION 12/12/2006   Annotation: stitch abcess in lower abdomen after colon cancer resection Qualifier: Diagnosis of  By: [name] MD, [name]    . Myocardial infarction ([place]) 1985  . Postoperative stitch abscess 07/19/2011  . PVC's (premature ventricular contractions)   . Syncope and collapse    pt denies  . Thyroid disease    followed by [name]    Past Surgical History:  Procedure Laterality Date  . COLECTOMY  2000   6 inches removed  . COLONOSCOPY  2009   polyp, h/o cancer [name])  . EYE SURGERY     cataract surgery both eyes- pts ates he has never had cataract surgery 10-26-15  . HERNIA REPAIR  09/7015   DB umbilical hernia  . INCISE AND DRAIN ABCESS  2009,2010,2011,2012  . INCISION AND DRAINAGE Right 04/02/2013   Procedure: INCISION AND DRAINAGE RIGHT KNEE HEMATOMA;  Surgeon: [name], MD;  Location: [place];  Service: Orthopedics;  Laterality: Right;  . INCISION AND DRAINAGE ABSCESS N/A 09/20/2012   Procedure: INCISION AND DRAINAGE SUPRAPUBIC ABSCESS;  Surgeon:  [name], MD;  Location: [place];  Service: General;  Laterality: N/A;  . INCISION AND DRAINAGE ABSCESS Left 02/28/2013   Procedure: INCISION AND DRAINAGE LEFT ARM ABSCESS;  Surgeon: [name], MD;  Location: [place];  Service: General;  Laterality: Left;  . LUMBAR LAMINECTOMY  1977  . MASS EXCISION  03/07/2012   EXCISION MASS;  Surgeon: [name], MD;  Laterality: Right;  removal mass posterior right arm  . POLYPECTOMY    . WOUND EXPLORATION  2006   For possible stitch abscess Dr [name]    Current Medications: Current Meds  Medication Sig  . aspirin 81 MG tablet Take 162 mg by mouth daily.   [name] donepezil (ARICEPT) 10 MG tablet Take  10 mg by mouth at bedtime.  . fish oil-omega-3 fatty acids 1000 MG capsule Take 1 g by mouth daily.   . folic acid (FOLVITE) 1 MG tablet Take 1 mg by mouth daily.  . metoprolol tartrate (LOPRESSOR) 25 MG tablet Take 1 tablet (25 mg total) by mouth 2 (two) times daily.  . mupirocin cream (BACTROBAN) 2 % Apply 1 application topically 2 (two) times daily.  . nitroGLYCERIN (NITROSTAT) 0.4 MG SL tablet Place 1 tablet (0.4 mg total) under the tongue every 5 (five) minutes as needed for chest pain.  . peg 3350 powder (MOVIPREP) 100 g SOLR Take 1 kit by mouth. 1 kit for colon 6-22  . simvastatin (ZOCOR) 40 MG tablet TAKE 1 TABLET BY MOUTH EVERY MORNING  . Thiamine HCl (VITAMIN B-1 PO) Take 1 tablet by mouth daily.   . tizanidine (ZANAFLEX) 2 MG capsule Take 1 capsule (2 mg total) by mouth at bedtime as needed for muscle spasms.  [name] warfarin (COUMADIN) 5 MG tablet TAKE AS DIRECTED BY COUMADIN CLINIC  . zolpidem (AMBIEN) 10 MG tablet Take 1 tablet (10 mg total) by mouth at bedtime as needed for sleep.  . [DISCONTINUED] metoprolol tartrate (LOPRESSOR) 25 MG tablet TAKE ONE TABLET BY MOUTH TWICE DAILY  . [DISCONTINUED] nitroGLYCERIN (NITROSTAT) 0.4 MG SL tablet Place 1 tablet (0.4 mg total) under the tongue every 5 (five) minutes as needed for chest pain.       Allergies:   Review of patient's allergies indicates no known allergies.   Social History   Social History  . Marital status: Married    Spouse name: N/A  . Number of children: N/A  . Years of education: N/A   Social History Main Topics  . Smoking status: Former Smoker    Quit date: 10/13/1977  . Smokeless tobacco: Former Systems developer    Types: Chew    Quit date: 10/13/1977  . Alcohol use No  . Drug use: No  . Sexual activity: No   Other Topics Concern  . None   Social History Narrative   Caffeine: none   Lives with wife (43yr).  Grown daughters x2   Occupation: retired, had iHuman resources officer  Activity: take care of 3 houses   Diet:  fruits/vegetables daily, good water      Cards: NEcologist  [facility] [place]   Surg: TGrandville Silos  GI:[name]    Family History:  The patient's family history includes CAD (age of onset: 677 in his brother; CAD (age of onset: 769 in his father; Colon cancer in his cousin; Dementia in his brother and father; Dementia (age of onset: 865 in his sister; Diabetes in his brother; Hypertension in his father; Stroke in his brother, brother,  father, and mother; Stroke (age of onset: 58) in his sister.   ROS:   Please see the history of present illness.    ROS All other systems reviewed and are negative.   EKGs/Labs/Other Test Reviewed:    EKG:  EKG is  ordered today.  The ekg ordered today demonstrates Atrial fibrillation, HR 68, LBBB  Recent Labs: 11/05/2015: BUN 17; Creatinine, Ser 0.86; Potassium 4.0; Sodium 138; TSH 1.66   Recent Lipid Panel    Component Value Date/Time   CHOL 144 11/05/2015 0947   TRIG 183.0 (H) 11/05/2015 0947   HDL 40.90 11/05/2015 0947   CHOLHDL 4 11/05/2015 0947   VLDL 36.6 11/05/2015 0947   LDLCALC 66 11/05/2015 0947     Physical Exam:    VS:  BP 110/60   Pulse 68   Ht 5' 9" (1.753 m)   Wt 185 lb 12.8 oz (84.3 kg)   SpO2 98%   BMI 27.44 kg/m     Wt Readings from Last 3 Encounters:  01/14/16 185 lb 12.8 oz (84.3 kg)  11/11/15 184 lb (83.5 kg)  11/05/15 184 lb (83.5 kg)     Physical Exam  Constitutional: He is oriented to person, place, and time. He appears well-developed and well-nourished. No distress.  HENT:  Head: Normocephalic and atraumatic.  Eyes: No scleral icterus.  Neck: Normal range of motion. No JVD present.  Cardiovascular: Normal rate, S1 normal and S2 normal.  An irregularly irregular rhythm present.  No murmur heard. Pulmonary/Chest: Effort normal. He has decreased breath sounds. He has no wheezes. He has no rhonchi. He has no rales.  Abdominal: Soft. There is no tenderness.  Musculoskeletal: He exhibits no edema.  Neurological: He is  alert and oriented to person, place, and time.  Skin: Skin is warm and dry.  Psychiatric: He has a normal mood and affect.    ASSESSMENT:    1. Chronic atrial fibrillation ([place])   2. Hyperlipidemia   3. Other chest pain    PLAN:    In order of problems listed above:  1. Chronic AFib - Rate is controlled.  Continue Coumadin.  It is unclear why is on aspirin. I would suggest stopping this. I will review with Dr. Acie Fredrickson first.  2. HL - Followed by PCP.  Continue statin.  LDL in 6/17 was 66.  3. Chest pain - He has a long history of this. I reviewed his chart. This symptom dates back at least 2 years. Stress testing last year was reassuring with no evidence of ischemia. He does have LBBB on EKG. His echo earlier this year was normal. Overall his symptoms do not seem to have changed.  As his symptoms seem to occur prior to his metoprolol dose, I suggested changing metoprolol tartrate to metoprolol succinate. He declines. I also offered repeating a stress test today. He also declines.  Refill NTG.   Medication Adjustments/Labs and Tests Ordered: Current medicines are reviewed at length with the patient today.  Concerns regarding medicines are outlined above.  Medication changes, Labs and Tests ordered today are outlined in the Patient Instructions noted below. Patient Instructions  Medication Instructions:  Your physician recommends that you continue on your current medications as directed. Please refer to the Current Medication list given to you today.   Labwork: None  Testing/Procedures: None  Follow-Up: Your physician wants you to follow-up in: 6 months with Dr. Acie Fredrickson. You will receive a reminder letter in the mail two months in advance. If you  don't receive a letter, please call our office to schedule the follow-up appointment.   Any Other Special Instructions Will Be Listed Below (If Applicable). If you need a refill on your cardiac medications before your next appointment,  please call your pharmacy.  Signed, Richardson Dopp, PA-C  01/14/2016 10:30 AM    [facility] [place], [place], [place]  [address] Phone: [phone]; Fax: [phone]

## 2016-01-14 ENCOUNTER — Encounter: Payer: Self-pay | Admitting: Physician Assistant

## 2016-01-14 ENCOUNTER — Ambulatory Visit (INDEPENDENT_AMBULATORY_CARE_PROVIDER_SITE_OTHER): Payer: Medicare Other | Admitting: Physician Assistant

## 2016-01-14 VITALS — BP 110/60 | HR 68 | Ht 69.0 in | Wt 185.8 lb

## 2016-01-14 DIAGNOSIS — I482 Chronic atrial fibrillation, unspecified: Secondary | ICD-10-CM

## 2016-01-14 DIAGNOSIS — R0789 Other chest pain: Secondary | ICD-10-CM

## 2016-01-14 DIAGNOSIS — E785 Hyperlipidemia, unspecified: Secondary | ICD-10-CM

## 2016-01-14 MED ORDER — METOPROLOL TARTRATE 25 MG PO TABS: 25.0000 mg | ORAL_TABLET | Freq: Two times a day (BID) | ORAL | 11 refills | Status: DC

## 2016-01-14 MED ORDER — NITROGLYCERIN 0.4 MG SL SUBL: 0.4000 mg | SUBLINGUAL_TABLET | SUBLINGUAL | 6 refills | Status: DC | PRN

## 2016-01-14 NOTE — Patient Instructions (Addendum)
Medication Instructions:  Your physician recommends that you continue on your current medications as directed. Please refer to the Current Medication list given to you today.  Labwork: None  Testing/Procedures: None  Follow-Up: Your physician wants you to follow-up in: 6 months with Dr. Nahser.  You will receive a reminder letter in the mail two months in advance. If you don't receive a letter, please call our office to schedule the follow-up appointment.   Any Other Special Instructions Will Be Listed Below (If Applicable).     If you need a refill on your cardiac medications before your next appointment, please call your pharmacy.   

## 2016-01-19 DIAGNOSIS — L02211 Cutaneous abscess of abdominal wall: Secondary | ICD-10-CM | POA: Diagnosis not present

## 2016-01-22 ENCOUNTER — Other Ambulatory Visit: Payer: Self-pay | Admitting: Cardiovascular Disease

## 2016-01-24 ENCOUNTER — Ambulatory Visit (INDEPENDENT_AMBULATORY_CARE_PROVIDER_SITE_OTHER): Payer: Medicare Other | Admitting: Pharmacist

## 2016-01-24 DIAGNOSIS — Z5181 Encounter for therapeutic drug level monitoring: Secondary | ICD-10-CM

## 2016-01-24 DIAGNOSIS — I4891 Unspecified atrial fibrillation: Secondary | ICD-10-CM | POA: Diagnosis not present

## 2016-01-24 LAB — POCT INR: INR: 2

## 2016-01-24 MED ORDER — WARFARIN SODIUM 5 MG PO TABS: ORAL_TABLET | ORAL | 0 refills | Status: DC

## 2016-02-14 ENCOUNTER — Ambulatory Visit (INDEPENDENT_AMBULATORY_CARE_PROVIDER_SITE_OTHER): Payer: Medicare Other

## 2016-02-14 DIAGNOSIS — Z23 Encounter for immunization: Secondary | ICD-10-CM

## 2016-02-16 ENCOUNTER — Encounter: Payer: Self-pay | Admitting: Cardiovascular Disease

## 2016-02-17 ENCOUNTER — Ambulatory Visit (INDEPENDENT_AMBULATORY_CARE_PROVIDER_SITE_OTHER): Payer: Medicare Other | Admitting: Cardiovascular Disease

## 2016-02-17 ENCOUNTER — Encounter: Payer: Self-pay | Admitting: Cardiovascular Disease

## 2016-02-17 VITALS — BP 116/66 | HR 64 | Ht 69.0 in | Wt 183.6 lb

## 2016-02-17 DIAGNOSIS — I482 Chronic atrial fibrillation, unspecified: Secondary | ICD-10-CM

## 2016-02-17 MED ORDER — SIMVASTATIN 40 MG PO TABS: 40.0000 mg | ORAL_TABLET | Freq: Every morning | ORAL | 3 refills | Status: DC

## 2016-02-17 NOTE — Progress Notes (Signed)
Cardiology Office Note   Date:  02/17/2016   ID:  Christian Maxwell, DOB [birth year], MRN [id number]  PCP:  Ria Bush, MD  Cardiologist:  [name] Mertie Moores, MD   Chief Complaint  Patient presents with  . Atrial Fibrillation   1 . Atrial fibrillation 2. Hyperlipidemia 3. Premature ventricular contractions 4. Hypothyroidism   Christian Maxwell is a 82 year old gentleman with a history of atrial fibrillation, hyperlipidemia, and a history of PVCs. He exercises on a regular basis. He has not had any episodes of chest pain or shortness of breath. He denies any syncope or presyncope.  He's doing much better. He was having some shortness of breath earlier in the year and the end of last year.  Dec. 4, 2013 :  He presents today with some right sided chest wall pain. The discomfort increases with deep breath, right arm movement and with twisting of his torso. No dyspnea. No angina.   May 22, 2011: He's doing very well at this point. He is no longer having any episodes of chest wall pain. He still rides a stationary bike for 30 minutes twice a day and does not have any episodes of pain.  Feb. 5, 2015:  Christian Maxwell has no complaints. He was seen with his wife. It is clear that he is gradually getting more and more demented. His wife states that he breathes heavily when climbing stairs. He's able to do all of his normal activities without any significant difficulties. He does notice that he'll typically develop some chest pain that she's late taking his second metoprolol dose of the day.   Sep 30, 2013:  Christian Maxwell is doing well. Stays busy.   Oct. 5, 2015:   Christian Maxwell continues to have DOE. His wife says that he has lots of dyspnea - he denies it.    Sep 28, 2014:  Christian Maxwell is a 82 y.o. male who presents for atrial fib .  He has had more dyspnea and some chest pain  - especially with exertion.  Has difficulty describing his chest pain .   We have suggested a stress test  in the past.  He now agrees to do the test.   Feb.   10 , 2017: Doing well. No complaints.  Saw Richardson Dopp last month. Echo showed normal LV function.   Has marked LAE.   Trace MR, moderate TR .   Oct. 5 2017:  Christian Maxwell is doing well. He's not having any episodes of chest pain or shortness of breath. He has been taking his medications.   Past Medical History:  Diagnosis Date  . Abscess 7/13   abd abscess  . Cancer [facility])    colon cancer  . Chronic atrial fibrillation ([place])   . History of colon cancer 2000   T3N0 s/p colectomy  . History of echocardiogram    a. Echo 2/13:  Mild LVH, EF 55-60%, mild MR, moderate LAE, mild to moderate RAE;  b. Echo 1/17: mild LVH, vigorous LVF, EF 65-70%, no RWMA, trivial AI, trivial MR, severe LAE, mild RAE, mod TR, PASP 32 mmHg  . History of stress test    a. Cardiolite in 9/04: EF 67%, no ischemia.;  b. Myoview 6/16: no ischemia   . Hx of adenomatous colonic polyps   . Hyperlipidemia   . Hypertension   . METHICILLIN RESISTANT STAPHYLOCOCCUS AUREUS INFECTION 12/12/2006   Annotation: stitch abcess in lower abdomen after colon cancer resection Qualifier: Diagnosis of  By: [name] MD, [name]    .  Myocardial infarction 1985  . Postoperative stitch abscess 07/19/2011  . PVC's (premature ventricular contractions)   . Syncope and collapse    pt denies  . Thyroid disease    followed by [name]    Past Surgical History:  Procedure Laterality Date  . COLECTOMY  2000   6 inches removed  . COLONOSCOPY  2009   polyp, h/o cancer [name])  . EYE SURGERY     cataract surgery both eyes- pts ates he has never had cataract surgery 10-26-15  . HERNIA REPAIR  05/1939   DB umbilical hernia  . INCISE AND DRAIN ABCESS  2009,2010,2011,2012  . INCISION AND DRAINAGE Right 04/02/2013   Procedure: INCISION AND DRAINAGE RIGHT KNEE HEMATOMA;  Surgeon: [name], MD;  Location: [place];  Service: Orthopedics;  Laterality: Right;  . INCISION AND DRAINAGE  ABSCESS N/A 09/20/2012   Procedure: INCISION AND DRAINAGE SUPRAPUBIC ABSCESS;  Surgeon: [name], MD;  Location: [place];  Service: General;  Laterality: N/A;  . INCISION AND DRAINAGE ABSCESS Left 02/28/2013   Procedure: INCISION AND DRAINAGE LEFT ARM ABSCESS;  Surgeon: [name], MD;  Location: [place];  Service: General;  Laterality: Left;  . LUMBAR LAMINECTOMY  1977  . MASS EXCISION  03/07/2012   EXCISION MASS;  Surgeon: [name], MD;  Laterality: Right;  removal mass posterior right arm  . POLYPECTOMY    . WOUND EXPLORATION  2006   For possible stitch abscess Dr [name]     Current Outpatient Prescriptions  Medication Sig Dispense Refill  . aspirin 81 MG tablet Take 162 mg by mouth daily.     [name] donepezil (ARICEPT) 10 MG tablet Take 10 mg by mouth at bedtime.    . fish oil-omega-3 fatty acids 1000 MG capsule Take 1 g by mouth daily.     . folic acid (FOLVITE) 1 MG tablet Take 1 mg by mouth daily.    . metoprolol tartrate (LOPRESSOR) 25 MG tablet Take 1 tablet (25 mg total) by mouth 2 (two) times daily. 60 tablet 11  . mupirocin cream (BACTROBAN) 2 % Apply 1 application topically 2 (two) times daily. 30 g 1  . nitroGLYCERIN (NITROSTAT) 0.4 MG SL tablet Place 1 tablet (0.4 mg total) under the tongue every 5 (five) minutes as needed for chest pain. 25 tablet 6  . peg 3350 powder (MOVIPREP) 100 g SOLR Take 1 kit by mouth. 1 kit for colon 6-22    . simvastatin (ZOCOR) 40 MG tablet TAKE 1 TABLET BY MOUTH EVERY MORNING 90 tablet 3  . Thiamine HCl (VITAMIN B-1 PO) Take 1 tablet by mouth daily.     . tizanidine (ZANAFLEX) 2 MG capsule Take 1 capsule (2 mg total) by mouth at bedtime as needed for muscle spasms. 30 capsule 2  . warfarin (COUMADIN) 5 MG tablet Take as directed by Coumadin Clinic. 30 tablet 0  . zolpidem (AMBIEN) 10 MG tablet Take 1 tablet (10 mg total) by mouth at bedtime as needed for sleep.     No current facility-administered medications  for this visit.     Allergies:   Review of patient's allergies indicates no known allergies.    Social History:  The patient  reports that he quit smoking about 38 years ago. He quit smokeless tobacco use about 38 years ago. His smokeless tobacco use included Chew. He reports that he does not drink alcohol or use drugs.   Family History:  The patient's family history  includes CAD (age of onset: 66) in his brother; CAD (age of onset: 58) in his father; Colon cancer in his cousin; Dementia in his brother and father; Dementia (age of onset: 65) in his sister; Diabetes in his brother; Hypertension in his father; Stroke in his brother, brother, father, and mother; Stroke (age of onset: 65) in his sister.    ROS:  Please see the history of present illness.    Review of Systems: Constitutional:  denies fever, chills, diaphoresis, appetite change and fatigue.  HEENT: denies photophobia, eye pain, redness, hearing loss, ear pain, congestion, sore throat, rhinorrhea, sneezing, neck pain, neck stiffness and tinnitus.  Respiratory: denies SOB, DOE, cough, chest tightness, and wheezing.  Cardiovascular: denies chest pain, palpitations and leg swelling.  Gastrointestinal: denies nausea, vomiting, abdominal pain, diarrhea, constipation, blood in stool.  Genitourinary: denies dysuria, urgency, frequency, hematuria, flank pain and difficulty urinating.  Musculoskeletal: denies  myalgias, back pain, joint swelling, arthralgias and gait problem.   Skin: denies pallor, rash and wound.  Neurological: denies dizziness, seizures, syncope, weakness, light-headedness, numbness and headaches.   Hematological: denies adenopathy, easy bruising, personal or family bleeding history.  Psychiatric/ Behavioral: denies suicidal ideation, mood changes, confusion, nervousness, sleep disturbance and agitation.       All other systems are reviewed and negative.    PHYSICAL EXAM: VS:  BP 116/66 (BP Location: Left Arm,  Patient Position: Sitting, Cuff Size: Normal)   Pulse 64   Ht '5\' 9"'  (1.753 m)   Wt 183 lb 9.6 oz (83.3 kg)   BMI 27.11 kg/m  , BMI Body mass index is 27.11 kg/m. GEN: Well nourished, well developed, in no acute distress  HEENT: normal  Neck: no JVD, carotid bruits, or masses Cardiac: Irreg. Irreg.  no murmurs, rubs, or gallops,no edema  Respiratory:  clear to auscultation bilaterally, normal work of breathing GI: soft, nontender, nondistended, + BS MS: no deformity or atrophy  Skin: warm and dry, no rash Neuro:  Strength and sensation are intact Psych: normal   EKG:  EKG is ordered today. Atrial fib with controlled V rate.   Occasional aberrant conduction    Recent Labs: 11/05/2015: BUN 17; Creatinine, Ser 0.86; Potassium 4.0; Sodium 138; TSH 1.66    Lipid Panel    Component Value Date/Time   CHOL 144 11/05/2015 0947   TRIG 183.0 (H) 11/05/2015 0947   HDL 40.90 11/05/2015 0947   CHOLHDL 4 11/05/2015 0947   VLDL 36.6 11/05/2015 0947   LDLCALC 66 11/05/2015 0947      Wt Readings from Last 3 Encounters:  02/17/16 183 lb 9.6 oz (83.3 kg)  01/14/16 185 lb 12.8 oz (84.3 kg)  11/11/15 184 lb (83.5 kg)      Other studies Reviewed: Additional studies/ records that were reviewed today include: . Review of the above records demonstrates:    ASSESSMENT AND PLAN:  1 . Atrial fibrillation- his rate  is very well-controlled. Continue current medications. 2. Hyperlipidemia 3. Premature ventricular contractions 4. Hypothyroidism - followed by primary MD      Current medicines are reviewed at length with the patient today.  The patient does not have concerns regarding medicines.  The following changes have been made:  no change  Labs/ tests ordered today include:  No orders of the defined types were placed in this encounter.    Disposition:   FU with me in 6 months      Mertie Moores, MD  02/17/2016 10:50 AM    [facility] [place], [place], [place]  [address] Phone: [phone]; Fax: [phone]

## 2016-02-17 NOTE — Patient Instructions (Signed)

## 2016-02-21 ENCOUNTER — Ambulatory Visit (INDEPENDENT_AMBULATORY_CARE_PROVIDER_SITE_OTHER): Payer: Medicare Other | Admitting: Family Medicine

## 2016-02-21 ENCOUNTER — Ambulatory Visit (INDEPENDENT_AMBULATORY_CARE_PROVIDER_SITE_OTHER): Payer: Medicare Other | Admitting: *Deleted

## 2016-02-21 ENCOUNTER — Encounter: Payer: Self-pay | Admitting: Family Medicine

## 2016-02-21 VITALS — BP 120/70 | HR 83 | Temp 98.5°F | Ht 73.0 in | Wt 182.5 lb

## 2016-02-21 DIAGNOSIS — I4891 Unspecified atrial fibrillation: Secondary | ICD-10-CM

## 2016-02-21 DIAGNOSIS — M7501 Adhesive capsulitis of right shoulder: Secondary | ICD-10-CM

## 2016-02-21 DIAGNOSIS — Z5181 Encounter for therapeutic drug level monitoring: Secondary | ICD-10-CM | POA: Diagnosis not present

## 2016-02-21 DIAGNOSIS — M19011 Primary osteoarthritis, right shoulder: Secondary | ICD-10-CM

## 2016-02-21 LAB — POCT INR: INR: 1.7

## 2016-02-21 MED ORDER — METHYLPREDNISOLONE ACETATE 40 MG/ML IJ SUSP
80.0000 mg | Freq: Once | INTRAMUSCULAR | Status: AC
  Administered 2016-02-21: 80 mg via INTRA_ARTICULAR

## 2016-02-21 NOTE — Progress Notes (Signed)
Pre visit review using our clinic review tool, if applicable. No additional management support is needed unless otherwise documented below in the visit note. 

## 2016-02-21 NOTE — Progress Notes (Signed)
   Dr. Frederico Hamman T. Matilda Fleig, MD, [facility] [place], [address] Phone: [phone] Fax: [phone]  02/21/2016  Patient: Christian Maxwell, MRN: [id number], DOB: [birth year], 82 y.o.  Primary Physician:  Ria Bush, MD   Chief Complaint  Patient presents with  . Shoulder Pain    Wants Right Shoulder Injected    Frozen shoulder, R  Known multi-planar loss of motion, improved in the past with intraarticular injection.  Intrarticular Shoulder Injection, R Verbal consent was obtained from the patient. Risks including infection explained and contrasted with benefits and alternatives. Patient prepped with Chloraprep and Ethyl Chloride used for anesthesia. An intraarticular shoulder injection was performed using the posterior approach. The patient tolerated the procedure well and had decreased pain post injection. No complications. Injection: 8 cc of Lidocaine 1% and 2 mL Depo-Medrol 40 mg. Needle: 22 gauge   Signed,  Tearia Gibbs T. Cherish Runde, MD

## 2016-02-21 NOTE — Addendum Note (Signed)
Addended by: Carter Kitten on: 02/21/2016 03:37 PM   Modules accepted: Orders

## 2016-02-28 ENCOUNTER — Other Ambulatory Visit: Payer: Self-pay | Admitting: Physician Assistant

## 2016-02-28 ENCOUNTER — Ambulatory Visit: Payer: Medicare Other | Admitting: Family Medicine

## 2016-03-06 ENCOUNTER — Ambulatory Visit (INDEPENDENT_AMBULATORY_CARE_PROVIDER_SITE_OTHER): Payer: Medicare Other | Admitting: *Deleted

## 2016-03-06 DIAGNOSIS — Z5181 Encounter for therapeutic drug level monitoring: Secondary | ICD-10-CM | POA: Diagnosis not present

## 2016-03-06 DIAGNOSIS — I4891 Unspecified atrial fibrillation: Secondary | ICD-10-CM | POA: Diagnosis not present

## 2016-03-06 LAB — POCT INR: INR: 3

## 2016-03-22 DIAGNOSIS — L02211 Cutaneous abscess of abdominal wall: Secondary | ICD-10-CM | POA: Diagnosis not present

## 2016-03-27 ENCOUNTER — Ambulatory Visit (INDEPENDENT_AMBULATORY_CARE_PROVIDER_SITE_OTHER): Payer: Medicare Other

## 2016-03-27 DIAGNOSIS — Z5181 Encounter for therapeutic drug level monitoring: Secondary | ICD-10-CM | POA: Diagnosis not present

## 2016-03-27 DIAGNOSIS — I4891 Unspecified atrial fibrillation: Secondary | ICD-10-CM

## 2016-03-27 LAB — POCT INR: INR: 2.4

## 2016-04-03 ENCOUNTER — Telehealth: Payer: Self-pay | Admitting: Cardiovascular Disease

## 2016-04-03 ENCOUNTER — Encounter (HOSPITAL_COMMUNITY): Payer: Self-pay | Admitting: Nurse Practitioner

## 2016-04-03 ENCOUNTER — Emergency Department (HOSPITAL_COMMUNITY): Payer: Medicare Other

## 2016-04-03 ENCOUNTER — Emergency Department (HOSPITAL_COMMUNITY)
Admission: EM | Admit: 2016-04-03 | Discharge: 2016-04-03 | Disposition: A | Discharge status: Home / Self Care | Payer: Medicare Other | Attending: Emergency Medicine | Admitting: Emergency Medicine

## 2016-04-03 DIAGNOSIS — R072 Precordial pain: Secondary | ICD-10-CM | POA: Insufficient documentation

## 2016-04-03 DIAGNOSIS — R0789 Other chest pain: Secondary | ICD-10-CM | POA: Diagnosis not present

## 2016-04-03 DIAGNOSIS — R079 Chest pain, unspecified: Secondary | ICD-10-CM

## 2016-04-03 DIAGNOSIS — Z79899 Other long term (current) drug therapy: Secondary | ICD-10-CM | POA: Insufficient documentation

## 2016-04-03 DIAGNOSIS — I482 Chronic atrial fibrillation: Secondary | ICD-10-CM

## 2016-04-03 DIAGNOSIS — I447 Left bundle-branch block, unspecified: Secondary | ICD-10-CM | POA: Diagnosis not present

## 2016-04-03 DIAGNOSIS — Z87891 Personal history of nicotine dependence: Secondary | ICD-10-CM | POA: Insufficient documentation

## 2016-04-03 DIAGNOSIS — Z7982 Long term (current) use of aspirin: Secondary | ICD-10-CM | POA: Insufficient documentation

## 2016-04-03 DIAGNOSIS — I10 Essential (primary) hypertension: Secondary | ICD-10-CM | POA: Diagnosis not present

## 2016-04-03 DIAGNOSIS — I252 Old myocardial infarction: Secondary | ICD-10-CM | POA: Insufficient documentation

## 2016-04-03 DIAGNOSIS — Z85038 Personal history of other malignant neoplasm of large intestine: Secondary | ICD-10-CM | POA: Insufficient documentation

## 2016-04-03 DIAGNOSIS — Z7901 Long term (current) use of anticoagulants: Secondary | ICD-10-CM | POA: Diagnosis not present

## 2016-04-03 LAB — CBC
HCT: 44.2 % (ref 39.0–52.0)
Hemoglobin: 14.3 g/dL (ref 13.0–17.0)
MCH: 28.3 pg (ref 26.0–34.0)
MCHC: 32.4 g/dL (ref 30.0–36.0)
MCV: 87.4 fL (ref 78.0–100.0)
PLATELETS: 249 10*3/uL (ref 150–400)
RBC: 5.06 MIL/uL (ref 4.22–5.81)
RDW: 14 % (ref 11.5–15.5)
WBC: 8.2 10*3/uL (ref 4.0–10.5)

## 2016-04-03 LAB — I-STAT TROPONIN, ED
TROPONIN I, POC: 0 ng/mL (ref 0.00–0.08)
Troponin i, poc: 0 ng/mL (ref 0.00–0.08)

## 2016-04-03 LAB — BASIC METABOLIC PANEL
Anion gap: 7 (ref 5–15)
BUN: 13 mg/dL (ref 6–20)
CHLORIDE: 104 mmol/L (ref 101–111)
CO2: 28 mmol/L (ref 22–32)
CREATININE: 0.82 mg/dL (ref 0.61–1.24)
Calcium: 9.9 mg/dL (ref 8.9–10.3)
Glucose, Bld: 151 mg/dL — ABNORMAL HIGH (ref 65–99)
Potassium: 4.1 mmol/L (ref 3.5–5.1)
SODIUM: 139 mmol/L (ref 135–145)

## 2016-04-03 LAB — PROTIME-INR
INR: 2.52
PROTHROMBIN TIME: 27.6 s — AB (ref 11.4–15.2)

## 2016-04-03 LAB — MAGNESIUM: Magnesium: 2.2 mg/dL (ref 1.7–2.4)

## 2016-04-03 MED ORDER — ASPIRIN 81 MG PO CHEW
324.0000 mg | CHEWABLE_TABLET | Freq: Once | ORAL | Status: AC
  Administered 2016-04-03: 162 mg via ORAL
  Filled 2016-04-03: qty 4

## 2016-04-03 NOTE — ED Provider Notes (Signed)
This seemed care from Dr. Ron Parker at 4 PM. Please refer to his note for full history and physical patient. 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 Relieved by nitroglycerin prior to arrival. EKG without acute ischemic changes. Delta Troponin negative. At the time I assumed care, patient HDS. Awaiting cardiology consult.  Patient was evaluated by cardiology. Do not feel further labs or workup are necessary at this time. Will arrange outpatient follow-up with patient's cardiologist for further diagnostic testing. Patient was discharged in stable condition. Will follow-up as per cardiology instructions. Strict return precautions given.  Discussed with Dr. Tomi Bamberger, ED attending   Gibson Ramp, MD 04/04/16 [place], MD 04/04/16 [phone]

## 2016-04-03 NOTE — ED Provider Notes (Signed)
[facility] DEPT Provider Note   CSN: [id number] Arrival date & time: 04/03/16  1211     History   Chief Complaint Chief Complaint  Patient presents with  . Chest Pain    HPI Christian Maxwell is a 82 y.o. male.   Chest Pain   This is a recurrent problem. The current episode started 3 to 5 hours ago. The problem occurs rarely. The problem has been resolved. The pain is associated with exertion (washing windows). The pain is present in the substernal region. The pain is moderate. The quality of the pain is described as brief. The pain radiates to the left arm. The symptoms are aggravated by exertion. Associated symptoms include shortness of breath. Pertinent negatives include no abdominal pain, no back pain, no cough, no fever, no palpitations and no vomiting. He has tried rest and nitroglycerin for the symptoms. The treatment provided significant relief. Risk factors include male gender.  His past medical history is significant for CAD, hyperlipidemia and hypertension.  Pertinent negatives for past medical history include no seizures.    Past Medical History:  Diagnosis Date  . Abscess 7/13   abd abscess  . Cancer [facility])    colon cancer  . Chronic atrial fibrillation ([place])   . History of colon cancer 2000   T3N0 s/p colectomy  . History of echocardiogram    a. Echo 2/13:  Mild LVH, EF 55-60%, mild MR, moderate LAE, mild to moderate RAE;  b. Echo 1/17: mild LVH, vigorous LVF, EF 65-70%, no RWMA, trivial AI, trivial MR, severe LAE, mild RAE, mod TR, PASP 32 mmHg  . History of stress test    a. Cardiolite in 9/04: EF 67%, no ischemia.;  b. Myoview 6/16: no ischemia   . Hx of adenomatous colonic polyps   . Hyperlipidemia   . Hypertension   . METHICILLIN RESISTANT STAPHYLOCOCCUS AUREUS INFECTION 12/12/2006   Annotation: stitch abcess in lower abdomen after colon cancer resection Qualifier: Diagnosis of  By: [name] MD, [name]    . Myocardial infarction 1985  . Postoperative  stitch abscess 07/19/2011  . PVC's (premature ventricular contractions)   . Syncope and collapse    pt denies  . Thyroid disease    followed by [name]    Patient Active Problem List   Diagnosis Date Noted  . Hearing impairment 11/11/2015  . Olecranon bursitis of left elbow 11/11/2015  . LBBB (left bundle branch block) 05/27/2015  . Senile purpura ([place]) 02/22/2015  . Wound, open, arm, forearm 02/22/2015  . Advanced care planning/counseling discussion 11/06/2014  . Medicare annual wellness visit, initial 11/06/2014  . Bilateral knee pain 03/17/2014  . DOE (dyspnea on exertion) 02/16/2014  . Encounter for therapeutic drug monitoring 06/17/2013  . Memory impairment 03/06/2013  . MRSA colonization 12/12/2012  . Nevus of sternal region 12/04/2012  . Seasonal allergic rhinitis 10/22/2012  . Thyroid disease   . Suprapubic abscess 08/21/2012  . H/O colon cancer, stage I 06/24/2012  . Adenomatous colon polyp 06/24/2012  . Open wound-left arm and suprapubic area. 03/11/2012  . Chest discomfort 04/21/2011  . Hyperlipidemia 10/14/2010  . Atrial fibrillation ([place]) 08/08/2010  . INSOMNIA, MILD 12/12/2006    Past Surgical History:  Procedure Laterality Date  . COLECTOMY  2000   6 inches removed  . COLONOSCOPY  2009   polyp, h/o cancer [name])  . EYE SURGERY     cataract surgery both eyes- pts ates he has never had cataract surgery 10-26-15  . HERNIA REPAIR  05/6551   DB umbilical hernia  . INCISE AND DRAIN ABCESS  2009,2010,2011,2012  . INCISION AND DRAINAGE Right 04/02/2013   Procedure: INCISION AND DRAINAGE RIGHT KNEE HEMATOMA;  Surgeon: [name], MD;  Location: [place] [facility];  Service: Orthopedics;  Laterality: Right;  . INCISION AND DRAINAGE ABSCESS N/A 09/20/2012   Procedure: INCISION AND DRAINAGE SUPRAPUBIC ABSCESS;  Surgeon: [name], MD;  Location: [place];  Service: General;  Laterality: N/A;  . INCISION AND DRAINAGE ABSCESS Left 02/28/2013    Procedure: INCISION AND DRAINAGE LEFT ARM ABSCESS;  Surgeon: [name], MD;  Location: [place];  Service: General;  Laterality: Left;  . LUMBAR LAMINECTOMY  1977  . MASS EXCISION  03/07/2012   EXCISION MASS;  Surgeon: [name], MD;  Laterality: Right;  removal mass posterior right arm  . POLYPECTOMY    . WOUND EXPLORATION  2006   For possible stitch abscess Dr [name]       Home Medications    Prior to Admission medications   Medication Sig Start Date End Date Taking? Authorizing Provider  aspirin 81 MG tablet Take 162 mg by mouth daily.  02/22/15   Ria Bush, MD  donepezil (ARICEPT) 10 MG tablet Take 10 mg by mouth at bedtime.    Historical Provider, MD  fish oil-omega-3 fatty acids 1000 MG capsule Take 1 g by mouth daily.     Historical Provider, MD  folic acid (FOLVITE) 1 MG tablet Take 1 mg by mouth daily.    Historical Provider, MD  metoprolol tartrate (LOPRESSOR) 25 MG tablet Take 1 tablet (25 mg total) by mouth 2 (two) times daily. 01/14/16   Liliane Shi, PA-C  mupirocin cream (BACTROBAN) 2 % Apply 1 application topically 2 (two) times daily. 08/05/15   Jearld Fenton, NP  nitroGLYCERIN (NITROSTAT) 0.4 MG SL tablet Place 1 tablet (0.4 mg total) under the tongue every 5 (five) minutes as needed for chest pain. 01/14/16   Liliane Shi, PA-C  peg 3350 powder (MOVIPREP) 100 g SOLR Take 1 kit by mouth. 1 kit for colon 6-22    Historical Provider, MD  simvastatin (ZOCOR) 40 MG tablet Take 1 tablet (40 mg total) by mouth every morning. 02/17/16   Thayer Headings, MD  Thiamine HCl (VITAMIN B-1 PO) Take 1 tablet by mouth daily.     Historical Provider, MD  tizanidine (ZANAFLEX) 2 MG capsule Take 1 capsule (2 mg total) by mouth at bedtime as needed for muscle spasms. 02/22/15   Ria Bush, MD  warfarin (COUMADIN) 5 MG tablet TAKE AS DIRECTED BY COUMADIN CLINIC 02/28/16   Thayer Headings, MD  zolpidem (AMBIEN) 10 MG tablet Take 1 tablet (10 mg total) by mouth at  bedtime as needed for sleep. 09/20/15   Ria Bush, MD    Family History Family History  Problem Relation Age of Onset  . Stroke Mother   . Stroke Father   . Hypertension Father   . CAD Father 67    MI  . Dementia Father   . Stroke Sister 62  . Stroke Brother   . Stroke Brother   . Diabetes Brother   . CAD Brother 85    MI  . Dementia Sister 52  . Dementia Brother   . Colon cancer Cousin     x4 total with colon cancer  . Cancer Neg Hx   . Colon polyps Neg Hx   . Rectal cancer Neg Hx   .  Stomach cancer Neg Hx     Social History Social History  Substance Use Topics  . Smoking status: Former Smoker    Quit date: 10/13/1977  . Smokeless tobacco: Former Systems developer    Types: Chew    Quit date: 10/13/1977  . Alcohol use No     Allergies   Patient has no known allergies.   Review of Systems Review of Systems  Constitutional: Negative for chills and fever.  HENT: Negative for ear pain and sore throat.   Eyes: Negative for pain and visual disturbance.  Respiratory: Positive for shortness of breath. Negative for cough.   Cardiovascular: Positive for chest pain. Negative for palpitations.  Gastrointestinal: Negative for abdominal pain and vomiting.  Genitourinary: Negative for dysuria and hematuria.  Musculoskeletal: Negative for arthralgias and back pain.  Skin: Negative for color change and rash.  Neurological: Negative for seizures and syncope.  All other systems reviewed and are negative.    Physical Exam Updated Vital Signs BP 113/78   Pulse 67   Temp 97.5 F (36.4 C) (Oral)   Resp 16   Ht '5\' 10"'$  (1.778 m)   Wt 83.9 kg   SpO2 97%   BMI 26.54 kg/m   Physical Exam  Constitutional: He appears well-developed and well-nourished.  HENT:  Head: Normocephalic and atraumatic.  Eyes: Conjunctivae are normal.  Neck: Neck supple.  Cardiovascular: Normal rate.  An irregular rhythm present.  No murmur heard. Pulmonary/Chest: Effort normal and breath sounds  normal. No respiratory distress.  Abdominal: Soft. There is no tenderness.  Musculoskeletal: He exhibits no edema.  Neurological: He is alert.  Skin: Skin is warm and dry.  Psychiatric: He has a normal mood and affect.  Nursing note and vitals reviewed.    ED Treatments / Results  Labs (all labs ordered are listed, but only abnormal results are displayed) Labs Reviewed  BASIC METABOLIC PANEL - Abnormal; Notable for the following:       Result Value   Glucose, Bld 151 (*)    All other components within normal limits  PROTIME-INR - Abnormal; Notable for the following:    Prothrombin Time 27.6 (*)    All other components within normal limits  CBC  MAGNESIUM  I-STAT TROPOININ, ED  I-STAT TROPOININ, ED    EKG  EKG Interpretation  Date/Time:  Monday April 03 2016 12:16:09 EST Ventricular Rate:  70 PR Interval:    QRS Duration: 82 QT Interval:  364 QTC Calculation: 393 R Axis:   52 Text Interpretation:  likely sinus rythym with multiple pvcs Otherwise normal ECG Otherwise no significant change Confirmed by Tyrone Nine MD, DANIEL [phone]) on 04/03/2016 2:00:31 PM       Radiology Dg Chest 2 View  Result Date: 04/03/2016 CLINICAL DATA:  Chest pain. EXAM: CHEST  2 VIEW COMPARISON:  02/27/2013. FINDINGS: Mediastinum and hilar structures normal. Low lung volumes with mild basilar atelectasis. Stable mild with normal pulmonary vascularity. IMPRESSION: 1.  Low lung volumes with mild basilar atelectasis. 2. Stable mild cardiomegaly. Electronically Signed   By: Marcello Moores  Register   On: 04/03/2016 13:06    Procedures Procedures (including critical care time)  Medications Ordered in ED Medications  aspirin chewable tablet 324 mg (162 mg Oral Given 04/03/16 1446)     Initial Impression / Assessment and Plan / ED Course  I have reviewed the triage vital signs and the nursing notes.  Pertinent labs & imaging results that were available during my care of the patient were reviewed by  me and considered in my medical decision making (see chart for details).  Clinical Course     82 year old male with past medical history of MI in the 1980s hypertension hyperlipidemia, comes today with substernal chest pain while he was washing his windows. 2 nitroglycerin and rested and felt better. He called his cardiologist told him to come to the emergency department to get evaluated. The chest pain radiated to his left shoulder and he had shortness of breath with it. Vital signs are stable he's afebrile. His EKG shows multiple PVCs with no acute signs of ischemia interval upper body or other arrhythmia. It is irregular. He does have a history of A. fib and is on anticoagulation. His INR is 2.5. First troponin is negative. Second troponin is sent. Other labs were unremarkable for any acute changes. Patient will be seen by cardiology the emergency department further evaluations about his cranial care. Aspirin is given.  Patient care is handed off to Dr. and Tamala Julian at 4 PM. For the remainder of this patient's care and ultimate disposition please see her note.  Final Clinical Impressions(s) / ED Diagnoses   Final diagnoses:  Precordial pain    New Prescriptions New Prescriptions   No medications on file     Dewaine Conger, MD 04/03/16 [place], DO 04/04/16 [id number]

## 2016-04-03 NOTE — ED Notes (Signed)
EDP at bedside  

## 2016-04-03 NOTE — ED Notes (Signed)
Wheeled pt to room from waiting room.

## 2016-04-03 NOTE — Telephone Encounter (Signed)
The pt is currently in the ER.

## 2016-04-03 NOTE — ED Notes (Signed)
Pt. Took two 81mg  asa at home PTA

## 2016-04-03 NOTE — Telephone Encounter (Signed)
New message  1. Pt is having chest pain now 2. No other symptoms 3. Having chest pains for a couple of days 4.continuous  5. No nitro

## 2016-04-03 NOTE — Consult Note (Signed)
Patient ID: KODEE DESARRO MRN: [id number], DOB/AGE: 01/03/1933   Admit date: 04/03/2016   Reason for Consult: Chest Pain Requesting MD: Dr. Tyrone Nine, [facility] ED   Primary Physician: Ria Bush, MD Primary Cardiologist: Dr. Acie Fredrickson   Pt. Profile:  82 y/o male with a h/o chronic atrial fibrillation, PVCs, LBBB and HLD, presenting to the ED with atypical CP.   Problem List  Past Medical History:  Diagnosis Date  . Abscess 7/13   abd abscess  . Cancer [facility])    colon cancer  . Chronic atrial fibrillation ([place])   . History of colon cancer 2000   T3N0 s/p colectomy  . History of echocardiogram    a. Echo 2/13:  Mild LVH, EF 55-60%, mild MR, moderate LAE, mild to moderate RAE;  b. Echo 1/17: mild LVH, vigorous LVF, EF 65-70%, no RWMA, trivial AI, trivial MR, severe LAE, mild RAE, mod TR, PASP 32 mmHg  . History of stress test    a. Cardiolite in 9/04: EF 67%, no ischemia.;  b. Myoview 6/16: no ischemia   . Hx of adenomatous colonic polyps   . Hyperlipidemia   . Hypertension   . METHICILLIN RESISTANT STAPHYLOCOCCUS AUREUS INFECTION 12/12/2006   Annotation: stitch abcess in lower abdomen after colon cancer resection Qualifier: Diagnosis of  By: [name] MD, [name]    . Myocardial infarction 1985  . Postoperative stitch abscess 07/19/2011  . PVC's (premature ventricular contractions)   . Syncope and collapse    pt denies  . Thyroid disease    followed by [name]    Past Surgical History:  Procedure Laterality Date  . COLECTOMY  2000   6 inches removed  . COLONOSCOPY  2009   polyp, h/o cancer [name])  . EYE SURGERY     cataract surgery both eyes- pts ates he has never had cataract surgery 10-26-15  . HERNIA REPAIR  [id number]   DB umbilical hernia  . INCISE AND DRAIN ABCESS  2009,2010,2011,2012  . INCISION AND DRAINAGE Right 04/02/2013   Procedure: INCISION AND DRAINAGE RIGHT KNEE HEMATOMA;  Surgeon: [name], MD;  Location: [place];  Service: Orthopedics;   Laterality: Right;  . INCISION AND DRAINAGE ABSCESS N/A 09/20/2012   Procedure: INCISION AND DRAINAGE SUPRAPUBIC ABSCESS;  Surgeon: [name], MD;  Location: [place];  Service: General;  Laterality: N/A;  . INCISION AND DRAINAGE ABSCESS Left 02/28/2013   Procedure: INCISION AND DRAINAGE LEFT ARM ABSCESS;  Surgeon: [name], MD;  Location: [place];  Service: General;  Laterality: Left;  . LUMBAR LAMINECTOMY  1977  . MASS EXCISION  03/07/2012   EXCISION MASS;  Surgeon: [name], MD;  Laterality: Right;  removal mass posterior right arm  . POLYPECTOMY    . WOUND EXPLORATION  2006   For possible stitch abscess Dr [name]     Allergies  No Known Allergies  HPI  82 y/o male with a h/o chronic atrial fibrillation, PVCs, LBBB and HLD, presenting to the ED with CP.  He is on coumadin for atrial fibrillation. He has no known CAD. He had a NST 10/2014 that was negative for ischemia. Echocardiogram 05/2015 showed normal LVEF at 65-70%. No WMA. There was trivial AI and trivial MR. Severe LAE, moderate TR, top normal RVSP at 32 mmHg was noted.   He has had atypical chest pain off and on since Saturday, 2 days ago. Feels like a knot in his chest. Non radiating. No exacerbating factors.  Not any worse or reproducible with exertion. He exercises on his stationary bike, 30 min at a time w/o symptoms. He did this yesterday and this morning w/o CP. Its not pleuritic. Not worse with meals. Not associated with dyspnea or diaphoresis. No particular pattern. Episodes can last hrs at a time. Today, he did try SL NTG which helped. He denies any recurrent CP since presenting to the ED.   CBC and BMP unremarkable. POC troponin is negative x 2. CXR shows low lung volumes with mild basilar atelectasis.    Home Medications  Prior to Admission medications   Medication Sig Start Date End Date Taking? Authorizing Provider  aspirin 81 MG tablet Take 162 mg by mouth daily.  02/22/15  Yes  Ria Bush, MD  donepezil (ARICEPT) 10 MG tablet Take 10 mg by mouth at bedtime.   Yes Historical Provider, MD  fish oil-omega-3 fatty acids 1000 MG capsule Take 1 g by mouth daily.    Yes Historical Provider, MD  folic acid (FOLVITE) 1 MG tablet Take 1 mg by mouth daily.   Yes Historical Provider, MD  metoprolol tartrate (LOPRESSOR) 25 MG tablet Take 1 tablet (25 mg total) by mouth 2 (two) times daily. 01/14/16  Yes Liliane Shi, PA-C  mupirocin cream (BACTROBAN) 2 % Apply 1 application topically 2 (two) times daily. 08/05/15  Yes Jearld Fenton, NP  nitroGLYCERIN (NITROSTAT) 0.4 MG SL tablet Place 1 tablet (0.4 mg total) under the tongue every 5 (five) minutes as needed for chest pain. 01/14/16  Yes Scott Joylene Draft, PA-C  simvastatin (ZOCOR) 40 MG tablet Take 1 tablet (40 mg total) by mouth every morning. 02/17/16  Yes Thayer Headings, MD  Thiamine HCl (VITAMIN B-1 PO) Take 1 tablet by mouth daily.    Yes Historical Provider, MD  warfarin (COUMADIN) 5 MG tablet TAKE AS DIRECTED BY COUMADIN CLINIC Patient taking differently: pt takes 2.5mg  on Mondays and Fridays - takes 5mg  all other days 02/28/16  Yes Thayer Headings, MD  zolpidem (AMBIEN) 10 MG tablet Take 1 tablet (10 mg total) by mouth at bedtime as needed for sleep. 09/20/15  Yes Ria Bush, MD  tizanidine (ZANAFLEX) 2 MG capsule Take 1 capsule (2 mg total) by mouth at bedtime as needed for muscle spasms. Patient not taking: Reported on 04/03/2016 02/22/15   Ria Bush, MD    Family History  Family History  Problem Relation Age of Onset  . Stroke Mother   . Stroke Father   . Hypertension Father   . CAD Father 78    MI  . Dementia Father   . Stroke Sister 90  . Stroke Brother   . Stroke Brother   . Diabetes Brother   . CAD Brother 49    MI  . Dementia Sister 36  . Dementia Brother   . Colon cancer Cousin     x4 total with colon cancer  . Cancer Neg Hx   . Colon polyps Neg Hx   . Rectal cancer Neg Hx   . Stomach  cancer Neg Hx     Social History  Social History   Social History  . Marital status: Married    Spouse name: N/A  . Number of children: N/A  . Years of education: N/A   Occupational History  . Not on file.   Social History Main Topics  . Smoking status: Former Smoker    Quit date: 10/13/1977  . Smokeless tobacco: Former Systems developer  Types: Sarina Ser    Quit date: 10/13/1977  . Alcohol use No  . Drug use: No  . Sexual activity: No   Other Topics Concern  . Not on file   Social History Narrative   Caffeine: none   Lives with wife (40yrs).  Grown daughters x2   Occupation: retired, had Human resources officer   Activity: take care of 3 houses   Diet: fruits/vegetables daily, good water      Cards: Ecologist   [place]o: [place]   Surg: [name]   [facility]     Review of Systems General:  No chills, fever, night sweats or weight changes.  Cardiovascular:  No chest pain, dyspnea on exertion, edema, orthopnea, palpitations, paroxysmal nocturnal dyspnea. Dermatological: No rash, lesions/masses Respiratory: No cough, dyspnea Urologic: No hematuria, dysuria Abdominal:   No nausea, vomiting, diarrhea, bright red blood per rectum, melena, or hematemesis Neurologic:  No visual changes, wkns, changes in mental status. All other systems reviewed and are otherwise negative except as noted above.  Physical Exam  Blood pressure 113/78, pulse 67, temperature 97.5 F (36.4 C), temperature source Oral, resp. rate 16, height 5\' 10"  (1.778 m), weight 185 lb (83.9 kg), SpO2 97 %.  General: Pleasant, NAD elderly  Psych: Normal affect. Neuro: Alert and oriented X 3. Moves all extremities spontaneously. HEENT: Normal  Neck: Supple without bruits or JVD. Lungs:  Resp regular and unlabored, CTA. Heart: RRR no s3, s4, or murmurs. Abdomen: Soft, non-tender, non-distended, BS + x 4.  Extremities: No clubbing, cyanosis or edema. DP/PT/Radials 2+ and equal bilaterally.  Labs  Troponin [facility] of Care  Test)  Recent Labs  04/03/16 1452  TROPIPOC 0.00   No results for input(s): CKTOTAL, CKMB, TROPONINI in the last 72 hours. Lab Results  Component Value Date   WBC 8.2 04/03/2016   HGB 14.3 04/03/2016   HCT 44.2 04/03/2016   MCV 87.4 04/03/2016   PLT 249 04/03/2016    Recent Labs Lab 04/03/16 1222  NA 139  K 4.1  CL 104  CO2 28  BUN 13  CREATININE 0.82  CALCIUM 9.9  GLUCOSE 151*   Lab Results  Component Value Date   CHOL 144 11/05/2015   HDL 40.90 11/05/2015   LDLCALC 66 11/05/2015   TRIG 183.0 (H) 11/05/2015   No results found for: DDIMER   Radiology/Studies  Dg Chest 2 View  Result Date: 04/03/2016 CLINICAL DATA:  Chest pain. EXAM: CHEST  2 VIEW COMPARISON:  02/27/2013. FINDINGS: Mediastinum and hilar structures normal. Low lung volumes with mild basilar atelectasis. Stable mild with normal pulmonary vascularity. IMPRESSION: 1.  Low lung volumes with mild basilar atelectasis. 2. Stable mild cardiomegaly. Electronically Signed   By: Marcello Moores  Register   On: 04/03/2016 13:06    ECG  Atrial fibrillation    ASSESSMENT AND PLAN  1. Atypical Chest Pain: atypical chest pain that last hrs at a time. Not triggered nor exacerbated by exertion. He had no pain this morning while exercising on his stationary bike for 30 min. He had a normal NST a year ago. POC troponin is negative x 2. Recommend checking a f/u troponin I. If negative, can d/c from the ED with plans for outpatient f/u. ? GERD. Can try H2 blocker or PPI.   2. Chronic Atrial Fibrillation: rate is controlled on BB. He is on Coumadin for a/c. INR is therapeutic.    Signed, Lyda Jester, PA-C 04/03/2016, 4:29 PM   Agree with note written by Ellen Henri  [facility]  Pt of Dr Cathie Olden with CAF on coumadin AC, LBBB and neg MV 6/16. He has had several days of atyp CP lasting hours at a time. He decided to take a SL NTG today and CP resolved. Currently pain free. Exam benign. POC Trop neg X 2. EKG w/o acute  changes (Chronic LBBB). OK to DC home. Will get Lexiscan MV as OP then F/U with Dr. Cathie Olden.  Quay Burow 04/03/2016 5:15 PM

## 2016-04-03 NOTE — ED Triage Notes (Signed)
Pt presents with c/o CP. The pain began 2 days ago. The pain has been intermittent. The pain is midsternal with radiation down L arm. Describes as feeling like something is stuck in his throat. He denies weakness, lightheadedness, diaphoresis, fevers, n/v. He denies SOB or cough but his wife says she has noticed hiim coughing and being out of breath recently. He tried nitro this morning with some relief.

## 2016-04-03 NOTE — Telephone Encounter (Signed)
I spoke with the pt and he complains of 8/10 pain in the center of his chest and left shoulder/arm pain that has been coming and going with exertion for a couple of days. The pt denies any other associated symptoms.  The pt took 2 additional ASA this morning due to symptoms continuing to bother him. The pt said this pain is different because it is not letting up.  The pt does not have a way to check his BP and pulse. The pt has not used any NTG but he does have a prescription. I advised the pt to take 1 NTG and he did note some improvement in pain while on the phone with me.  I advised him that he can take an additional NTG in 5 minutes.  I also instructed the pt that due to having active symptoms he should be evaluated in the ER and have EMS transport him.  The pt does not want to call EMS but I did advise him this is for his safety and other's on the road.  At this time the pt is undecided about whether or not he will go to the ER.

## 2016-04-04 ENCOUNTER — Other Ambulatory Visit: Payer: Self-pay | Admitting: Cardiology

## 2016-04-04 DIAGNOSIS — R079 Chest pain, unspecified: Secondary | ICD-10-CM

## 2016-04-04 NOTE — Progress Notes (Signed)
Order for stress test placed per Dr. Gwenlyn Found consult on 04/03/16 for atypical chest pain. F/u with Dr. Acie Fredrickson

## 2016-04-11 ENCOUNTER — Ambulatory Visit (HOSPITAL_COMMUNITY): Payer: Medicare Other | Attending: Internal Medicine

## 2016-04-11 DIAGNOSIS — R079 Chest pain, unspecified: Secondary | ICD-10-CM | POA: Diagnosis not present

## 2016-04-11 LAB — MYOCARDIAL PERFUSION IMAGING
CHL CUP NUCLEAR SRS: 5
CHL CUP RESTING HR STRESS: 74 {beats}/min
CSEPPHR: 102 {beats}/min
LVDIAVOL: 81 mL (ref 62–150)
LVSYSVOL: 32 mL
RATE: 0.34
SDS: 2
SSS: 7
TID: 0.98

## 2016-04-11 MED ORDER — TECHNETIUM TC 99M TETROFOSMIN IV KIT
10.7000 | PACK | Freq: Once | INTRAVENOUS | Status: AC | PRN
  Administered 2016-04-11: 10.7 via INTRAVENOUS
  Filled 2016-04-11: qty 11

## 2016-04-11 MED ORDER — REGADENOSON 0.4 MG/5ML IV SOLN
0.4000 mg | Freq: Once | INTRAVENOUS | Status: AC
  Administered 2016-04-11: 0.4 mg via INTRAVENOUS

## 2016-04-11 MED ORDER — TECHNETIUM TC 99M TETROFOSMIN IV KIT
31.6000 | PACK | Freq: Once | INTRAVENOUS | Status: AC | PRN
  Administered 2016-04-11: 31.6 via INTRAVENOUS
  Filled 2016-04-11: qty 32

## 2016-04-13 ENCOUNTER — Telehealth: Payer: Self-pay | Admitting: Cardiovascular Disease

## 2016-04-13 NOTE — Telephone Encounter (Signed)
I spoke with pt. He was seen in ED on 04/03/16 with atypical chest pain. Pt reports he woke up this morning at 6 AM with chest pain. He took lopressor and one NTG.  Sat in recliner and pain went away around 11 AM.  He went to lunch and pain returned this afternoon.  He describes pain as "not much" this afternoon. Rates as a 2.  Was worse earlier today.  Pt reports this is the same type pain he had when seen in ED.  He reports pain has occurred every day since 11/20. Usually lasts from 6 AM to 10 AM and then occurs again in the afternoon.  Describes as pain in the middle of his chest and feels "like I swallowed something."  Also states he has swelling in left hand and arm.  Swelling present for last 2-3 days.  Will review with provider in office. Pt has appt with Dr. Acie Fredrickson tomorrow at 10:30.

## 2016-04-13 NOTE — Telephone Encounter (Signed)
New Message  Pt wife call requesting to speak with RN. Pt wife states pt started experiencing chest pain. Pt wife states pt took nitroglycerin at 7am this morning. Make an appt for pt to see Dr. Acie Fredrickson on 12/1. Pt wife ask would it be ok to give pt another nitro.  Pt wife hung up. Please call back to discuss

## 2016-04-13 NOTE — Telephone Encounter (Signed)
Stress test 04/11/16 low risk. No ischemia. Reviewed with Dr. Burt Knack and OK for pt to keep appt with Dr. Acie Fredrickson as planned. I tried 3 times to reach pt but phone is busy.

## 2016-04-13 NOTE — Telephone Encounter (Signed)
No answer on home phone number.  I placed call to mobile number and left message to call back

## 2016-04-13 NOTE — Telephone Encounter (Signed)
I attempted to call patient back. Phone rang 10 times without answer or voicemail.

## 2016-04-13 NOTE — Telephone Encounter (Signed)
I spoke with Christian Maxwell and told him to make sure he keeps appt with Dr. Acie Fredrickson tomorrow.  Christian Maxwell states he will be here for this appt.

## 2016-04-14 ENCOUNTER — Ambulatory Visit (INDEPENDENT_AMBULATORY_CARE_PROVIDER_SITE_OTHER): Payer: Medicare Other | Admitting: Cardiovascular Disease

## 2016-04-14 ENCOUNTER — Encounter: Payer: Self-pay | Admitting: Cardiovascular Disease

## 2016-04-14 VITALS — BP 126/74 | HR 70 | Ht 70.0 in | Wt 180.8 lb

## 2016-04-14 DIAGNOSIS — I4891 Unspecified atrial fibrillation: Secondary | ICD-10-CM | POA: Diagnosis not present

## 2016-04-14 DIAGNOSIS — E782 Mixed hyperlipidemia: Secondary | ICD-10-CM | POA: Diagnosis not present

## 2016-04-14 NOTE — Progress Notes (Signed)
Cardiology Office Note   Date:  04/14/2016   ID:  Christian Maxwell, DOB 10-29-1932, MRN [id number]  PCP:  Ria Bush, MD  Cardiologist:  Mertie Moores, MD   Chief Complaint  Patient presents with  . Follow-up    atrial fib   1 . Atrial fibrillation 2. Hyperlipidemia 3. Premature ventricular contractions 4. Hypothyroidism   Tha is a 82 year old gentleman with a history of atrial fibrillation, hyperlipidemia, and a history of PVCs. He exercises on a regular basis. He has not had any episodes of chest pain or shortness of breath. He denies any syncope or presyncope.  He's doing much better. He was having some shortness of breath earlier in the year and the end of last year.  Dec. 4, 2013 :  He presents today with some right sided chest wall pain. The discomfort increases with deep breath, right arm movement and with twisting of his torso. No dyspnea. No angina.   May 22, 2011: He's doing very well at this point. He is no longer having any episodes of chest wall pain. He still rides a stationary bike for 30 minutes twice a day and does not have any episodes of pain.  Feb. 5, 2015:  Christian Maxwell has no complaints. He was seen with his wife. It is clear that he is gradually getting more and more demented. His wife states that he breathes heavily when climbing stairs. He's able to do all of his normal activities without any significant difficulties. He does notice that he'll typically develop some chest pain that she's late taking his second metoprolol dose of the day.   Sep 30, 2013:  Christian Maxwell is doing well. Stays busy.   Oct. 5, 2015:   Christian Maxwell continues to have DOE. His wife says that he has lots of dyspnea - he denies it.    Sep 28, 2014:  Christian Maxwell is a 82 y.o. male who presents for atrial fib .  He has had more dyspnea and some chest pain  - especially with exertion.  Has difficulty describing his chest pain .   We have suggested a stress  test in the past.  He now agrees to do the test.   Feb.   10 , 2017: Doing well. No complaints.  Saw Richardson Dopp last month. Echo showed normal LV function.   Has marked LAE.   Trace MR, moderate TR .   Oct. 5 2017:  Christian Maxwell is doing well. He's not having any episodes of chest pain or shortness of breath. He has been taking his medications.  04/14/2016:   Christian Maxwell is seen today for follow-up of his chronic atrial fibrillation and chronic episodes of chest discomfort.  He was seen in the emergency room on November 20 with episode of chest pain. He had a [place] study earlier this week which was normal. He has no ischemia.  EF is 60%.  Chest pain  is feeling better.    Past Medical History:  Diagnosis Date  . Abscess 7/13   abd abscess  . Cancer [facility])    colon cancer  . Chronic atrial fibrillation ([place])   . History of colon cancer 2000   T3N0 s/p colectomy  . History of echocardiogram    a. Echo 2/13:  Mild LVH, EF 55-60%, mild MR, moderate LAE, mild to moderate RAE;  b. Echo 1/17: mild LVH, vigorous LVF, EF 65-70%, no RWMA, trivial AI, trivial MR, severe LAE, mild RAE, mod TR, PASP 32 mmHg  .  History of stress test    a. Cardiolite in 9/04: EF 67%, no ischemia.;  b. Myoview 6/16: no ischemia   . Hx of adenomatous colonic polyps   . Hyperlipidemia   . Hypertension   . METHICILLIN RESISTANT STAPHYLOCOCCUS AUREUS INFECTION 12/12/2006   Annotation: stitch abcess in lower abdomen after colon cancer resection Qualifier: Diagnosis of  By: [name] MD, [name]    . Myocardial infarction 1985  . Postoperative stitch abscess 07/19/2011  . PVC's (premature ventricular contractions)   . Syncope and collapse    pt denies  . Thyroid disease    followed by [name]    Past Surgical History:  Procedure Laterality Date  . COLECTOMY  2000   6 inches removed  . COLONOSCOPY  2009   polyp, h/o cancer [name])  . EYE SURGERY     cataract surgery both eyes- pts ates he has never had  cataract surgery 10-26-15  . HERNIA REPAIR  [id number]   DB umbilical hernia  . INCISE AND DRAIN ABCESS  2009,2010,2011,2012  . INCISION AND DRAINAGE Right 04/02/2013   Procedure: INCISION AND DRAINAGE RIGHT KNEE HEMATOMA;  Surgeon: [name], MD;  Location: [facility];  Service: Orthopedics;  Laterality: Right;  . INCISION AND DRAINAGE ABSCESS N/A 09/20/2012   Procedure: INCISION AND DRAINAGE SUPRAPUBIC ABSCESS;  Surgeon: [name], MD;  Location: [place];  Service: General;  Laterality: N/A;  . INCISION AND DRAINAGE ABSCESS Left 02/28/2013   Procedure: INCISION AND DRAINAGE LEFT ARM ABSCESS;  Surgeon: [name], MD;  Location: [place];  Service: General;  Laterality: Left;  . LUMBAR LAMINECTOMY  1977  . MASS EXCISION  03/07/2012   EXCISION MASS;  Surgeon: [name], MD;  Laterality: Right;  removal mass posterior right arm  . POLYPECTOMY    . WOUND EXPLORATION  2006   For possible stitch abscess Dr [name]     Current Outpatient Prescriptions  Medication Sig Dispense Refill  . aspirin 81 MG tablet Take 162 mg by mouth daily.     [name] donepezil (ARICEPT) 10 MG tablet Take 10 mg by mouth at bedtime.    . fish oil-omega-3 fatty acids 1000 MG capsule Take 1 g by mouth daily.     . folic acid (FOLVITE) 1 MG tablet Take 1 mg by mouth daily.    . metoprolol tartrate (LOPRESSOR) 25 MG tablet Take 1 tablet (25 mg total) by mouth 2 (two) times daily. 60 tablet 11  . mupirocin cream (BACTROBAN) 2 % Apply 1 application topically 2 (two) times daily. 30 g 1  . nitroGLYCERIN (NITROSTAT) 0.4 MG SL tablet Place 1 tablet (0.4 mg total) under the tongue every 5 (five) minutes as needed for chest pain. 25 tablet 6  . simvastatin (ZOCOR) 40 MG tablet Take 1 tablet (40 mg total) by mouth every morning. 90 tablet 3  . Thiamine HCl (VITAMIN B-1 PO) Take 1 tablet by mouth daily.     . tizanidine (ZANAFLEX) 2 MG capsule Take 1 capsule (2 mg total) by mouth at bedtime as  needed for muscle spasms. 30 capsule 2  . warfarin (COUMADIN) 5 MG tablet TAKE AS DIRECTED BY COUMADIN CLINIC (Patient taking differently: pt takes 2.5mg  on Mondays and Fridays - takes 5mg  all other days) 35 tablet 1  . zolpidem (AMBIEN) 10 MG tablet Take 1 tablet (10 mg total) by mouth at bedtime as needed for sleep.     No current facility-administered medications for this visit.  Allergies:   Patient has no known allergies.    Social History:  The patient  reports that he quit smoking about 38 years ago. He quit smokeless tobacco use about 38 years ago. His smokeless tobacco use included Chew. He reports that he does not drink alcohol or use drugs.   Family History:  The patient's family history includes CAD (age of onset: 47) in his brother; CAD (age of onset: 37) in his father; Colon cancer in his cousin; Dementia in his brother and father; Dementia (age of onset: 67) in his sister; Diabetes in his brother; Hypertension in his father; Stroke in his brother, brother, father, and mother; Stroke (age of onset: 23) in his sister.    ROS:  Please see the history of present illness.    Review of Systems: Constitutional:  denies fever, chills, diaphoresis, appetite change and fatigue.  HEENT: denies photophobia, eye pain, redness, hearing loss, ear pain, congestion, sore throat, rhinorrhea, sneezing, neck pain, neck stiffness and tinnitus.  Respiratory: denies SOB, DOE, cough, chest tightness, and wheezing.  Cardiovascular: denies chest pain, palpitations and leg swelling.  Gastrointestinal: denies nausea, vomiting, abdominal pain, diarrhea, constipation, blood in stool.  Genitourinary: denies dysuria, urgency, frequency, hematuria, flank pain and difficulty urinating.  Musculoskeletal: denies  myalgias, back pain, joint swelling, arthralgias and gait problem.   Skin: denies pallor, rash and wound.  Neurological: denies dizziness, seizures, syncope, weakness, light-headedness, numbness  and headaches.   Hematological: denies adenopathy, easy bruising, personal or family bleeding history.  Psychiatric/ Behavioral: denies suicidal ideation, mood changes, confusion, nervousness, sleep disturbance and agitation.       All other systems are reviewed and negative.    PHYSICAL EXAM: VS:  BP 126/74 (BP Location: Right Arm, Patient Position: Sitting, Cuff Size: Normal)   Pulse 70   Ht 5\' 10"  (1.778 m)   Wt 180 lb 12.8 oz (82 kg)   BMI 25.94 kg/m  , BMI Body mass index is 25.94 kg/m. GEN: Well nourished, well developed, in no acute distress  HEENT: normal  Neck: no JVD, carotid bruits, or masses Cardiac: Irreg. Irreg.  no murmurs, rubs, or gallops,no edema  Respiratory:  clear to auscultation bilaterally, normal work of breathing GI: soft, nontender, nondistended, + BS MS: no deformity or atrophy  Skin: warm and dry, no rash Neuro:  Strength and sensation are intact Psych: normal   EKG:  EKG is ordered today. Atrial fib with controlled V rate.   Occasional aberrant conduction    Recent Labs: 11/05/2015: TSH 1.66 04/03/2016: BUN 13; Creatinine, Ser 0.82; Hemoglobin 14.3; Magnesium 2.2; Platelets 249; Potassium 4.1; Sodium 139    Lipid Panel    Component Value Date/Time   CHOL 144 11/05/2015 0947   TRIG 183.0 (H) 11/05/2015 0947   HDL 40.90 11/05/2015 0947   CHOLHDL 4 11/05/2015 0947   VLDL 36.6 11/05/2015 0947   LDLCALC 66 11/05/2015 0947      Wt Readings from Last 3 Encounters:  04/14/16 180 lb 12.8 oz (82 kg)  04/11/16 185 lb (83.9 kg)  04/03/16 185 lb (83.9 kg)      Other studies Reviewed: Additional studies/ records that were reviewed today include: . Review of the above records demonstrates:    ASSESSMENT AND PLAN:  1 . Atrial fibrillation- his rate  is very well-controlled. Continue current medications. 2. Hyperlipidemia 3. Premature ventricular contractions 4. Hypothyroidism - followed by primary MD   5. Atypical CP .   Has had CP off  and on  for years.  Recent Lexiscan myoview was negative for ischemia.   LV function is normal   Continue to follow   Current medicines are reviewed at length with the patient today.  The patient does not have concerns regarding medicines.  The following changes have been made:  no change  Labs/ tests ordered today include:  No orders of the defined types were placed in this encounter.    Disposition:   FU with me in 6 months      Mertie Moores, MD  04/14/2016 10:43 AM    [facility] [address], [place], [place]  [address] Phone: [phone]; Fax: [phone]

## 2016-04-14 NOTE — Patient Instructions (Signed)

## 2016-04-25 ENCOUNTER — Ambulatory Visit (INDEPENDENT_AMBULATORY_CARE_PROVIDER_SITE_OTHER): Payer: Medicare Other | Admitting: *Deleted

## 2016-04-25 DIAGNOSIS — Z5181 Encounter for therapeutic drug level monitoring: Secondary | ICD-10-CM | POA: Diagnosis not present

## 2016-04-25 DIAGNOSIS — I4891 Unspecified atrial fibrillation: Secondary | ICD-10-CM

## 2016-04-25 LAB — POCT INR: INR: 2

## 2016-04-27 ENCOUNTER — Ambulatory Visit: Payer: Medicare Other | Admitting: Cardiovascular Disease

## 2016-05-11 ENCOUNTER — Other Ambulatory Visit: Payer: Self-pay | Admitting: Cardiovascular Disease

## 2016-05-22 ENCOUNTER — Telehealth: Payer: Self-pay | Admitting: Family Medicine

## 2016-05-22 NOTE — Telephone Encounter (Signed)
Patient has been having pain in his knees.  Patient's coming in for his shoulder on Wednesday and would like to know if he can get a shot in his knees on Wednesday.

## 2016-05-22 NOTE — Telephone Encounter (Signed)
Mr. Timbers notified that Dr. Lorelei Pont will take a look at his knees on Wednesday.

## 2016-05-22 NOTE — Telephone Encounter (Signed)
I can take a look at them - probably

## 2016-05-24 ENCOUNTER — Ambulatory Visit (INDEPENDENT_AMBULATORY_CARE_PROVIDER_SITE_OTHER): Payer: Medicare Other | Admitting: Family Medicine

## 2016-05-24 ENCOUNTER — Encounter: Payer: Self-pay | Admitting: Family Medicine

## 2016-05-24 VITALS — BP 110/62 | HR 85 | Temp 97.3°F | Ht 70.0 in | Wt 178.0 lb

## 2016-05-24 DIAGNOSIS — M170 Bilateral primary osteoarthritis of knee: Secondary | ICD-10-CM | POA: Diagnosis not present

## 2016-05-24 DIAGNOSIS — M25561 Pain in right knee: Secondary | ICD-10-CM

## 2016-05-24 DIAGNOSIS — M25562 Pain in left knee: Secondary | ICD-10-CM | POA: Diagnosis not present

## 2016-05-24 DIAGNOSIS — M19012 Primary osteoarthritis, left shoulder: Secondary | ICD-10-CM | POA: Diagnosis not present

## 2016-05-24 DIAGNOSIS — M25512 Pain in left shoulder: Secondary | ICD-10-CM

## 2016-05-24 MED ORDER — METHYLPREDNISOLONE ACETATE 40 MG/ML IJ SUSP
80.0000 mg | Freq: Once | INTRAMUSCULAR | Status: AC
  Administered 2016-05-24: 80 mg via INTRA_ARTICULAR

## 2016-05-24 NOTE — Progress Notes (Signed)
   Dr. Frederico Hamman T. Arionna Hoggard, MD, [facility] [place], [address] Phone: [phone] Fax: [phone]  05/24/2016  Patient: Christian Maxwell, MRN: [id number], DOB: [birth year], 83 y.o.  Primary Physician:  Ria Bush, MD   Chief Complaint  Patient presents with  . Shoulder Pain    Left  . Knee Pain    Bilateral    Procedure only.  Known knee OA, shoulder OA, history of frozen shoulder on the L.  Knee Injection, RIGHT Patient verbally consented to procedure. Risks (including potential rare risk of infection), benefits, and alternatives explained. Sterilely prepped with Chloraprep. Ethyl cholride used for anesthesia. 8 cc Lidocaine 1% mixed with 2 mL Depo-Medrol 40 mg injected using the anteromedial approach without difficulty. No complications with procedure and tolerated well. Patient had decreased pain post-injection.   Knee Injection, LEFT Patient verbally consented to procedure. Risks (including potential rare risk of infection), benefits, and alternatives explained. Sterilely prepped with Chloraprep. Ethyl cholride used for anesthesia. 8 cc Lidocaine 1% mixed with 2 mL Depo-Medrol 40 mg injected using the anteromedial approach without difficulty. No complications with procedure and tolerated well. Patient had decreased pain post-injection.   Intrarticular Shoulder Injection, LEFT Verbal consent was obtained from the patient. Risks including infection explained and contrasted with benefits and alternatives. Patient prepped with Chloraprep and Ethyl Chloride used for anesthesia. An intraarticular shoulder injection was performed using the posterior approach. The patient tolerated the procedure well and had decreased pain post injection. No complications. Injection: 8 cc of Lidocaine 1% and 2 mL Depo-Medrol 40 mg. Needle: 22 gauge   Signed,  Katrece Roediger T. Suhana Wilner, MD

## 2016-05-24 NOTE — Progress Notes (Signed)
Pre visit review using our clinic review tool, if applicable. No additional management support is needed unless otherwise documented below in the visit note. 

## 2016-05-30 ENCOUNTER — Ambulatory Visit (INDEPENDENT_AMBULATORY_CARE_PROVIDER_SITE_OTHER): Payer: Medicare Other | Admitting: *Deleted

## 2016-05-30 DIAGNOSIS — Z5181 Encounter for therapeutic drug level monitoring: Secondary | ICD-10-CM

## 2016-05-30 DIAGNOSIS — I4891 Unspecified atrial fibrillation: Secondary | ICD-10-CM

## 2016-05-30 LAB — POCT INR: INR: 1.7

## 2016-06-09 ENCOUNTER — Ambulatory Visit (INDEPENDENT_AMBULATORY_CARE_PROVIDER_SITE_OTHER): Payer: Medicare Other

## 2016-06-09 DIAGNOSIS — Z5181 Encounter for therapeutic drug level monitoring: Secondary | ICD-10-CM | POA: Diagnosis not present

## 2016-06-09 DIAGNOSIS — I4891 Unspecified atrial fibrillation: Secondary | ICD-10-CM | POA: Diagnosis not present

## 2016-06-09 LAB — POCT INR: INR: 5.3

## 2016-06-12 ENCOUNTER — Telehealth: Payer: Self-pay | Admitting: Cardiovascular Disease

## 2016-06-12 MED ORDER — WARFARIN SODIUM 5 MG PO TABS: ORAL_TABLET | ORAL | 0 refills | Status: DC

## 2016-06-12 NOTE — Telephone Encounter (Signed)
Pt calling to get an rx for Coumadin 5 mg, he went out of town and forgot his med, pls call into [place] [phone]

## 2016-06-22 DIAGNOSIS — I2510 Atherosclerotic heart disease of native coronary artery without angina pectoris: Secondary | ICD-10-CM | POA: Diagnosis not present

## 2016-06-22 DIAGNOSIS — R740 Nonspecific elevation of levels of transaminase and lactic acid dehydrogenase [LDH]: Secondary | ICD-10-CM | POA: Diagnosis not present

## 2016-06-22 DIAGNOSIS — E0500 Thyrotoxicosis with diffuse goiter without thyrotoxic crisis or storm: Secondary | ICD-10-CM | POA: Diagnosis not present

## 2016-06-22 DIAGNOSIS — C189 Malignant neoplasm of colon, unspecified: Secondary | ICD-10-CM | POA: Diagnosis not present

## 2016-06-22 DIAGNOSIS — E784 Other hyperlipidemia: Secondary | ICD-10-CM | POA: Diagnosis not present

## 2016-06-22 DIAGNOSIS — I480 Paroxysmal atrial fibrillation: Secondary | ICD-10-CM | POA: Diagnosis not present

## 2016-06-22 DIAGNOSIS — Z6825 Body mass index (BMI) 25.0-25.9, adult: Secondary | ICD-10-CM | POA: Diagnosis not present

## 2016-06-22 DIAGNOSIS — Z7901 Long term (current) use of anticoagulants: Secondary | ICD-10-CM | POA: Diagnosis not present

## 2016-06-22 DIAGNOSIS — F0280 Dementia in other diseases classified elsewhere without behavioral disturbance: Secondary | ICD-10-CM | POA: Diagnosis not present

## 2016-06-22 DIAGNOSIS — Z1389 Encounter for screening for other disorder: Secondary | ICD-10-CM | POA: Diagnosis not present

## 2016-07-19 DIAGNOSIS — L02211 Cutaneous abscess of abdominal wall: Secondary | ICD-10-CM | POA: Diagnosis not present

## 2016-07-31 DIAGNOSIS — M79671 Pain in right foot: Secondary | ICD-10-CM | POA: Diagnosis not present

## 2016-07-31 DIAGNOSIS — L97421 Non-pressure chronic ulcer of left heel and midfoot limited to breakdown of skin: Secondary | ICD-10-CM | POA: Diagnosis not present

## 2016-07-31 DIAGNOSIS — B351 Tinea unguium: Secondary | ICD-10-CM | POA: Diagnosis not present

## 2016-07-31 DIAGNOSIS — M79672 Pain in left foot: Secondary | ICD-10-CM | POA: Diagnosis not present

## 2016-08-23 DIAGNOSIS — L02211 Cutaneous abscess of abdominal wall: Secondary | ICD-10-CM | POA: Diagnosis not present

## 2016-10-03 ENCOUNTER — Encounter: Payer: Self-pay | Admitting: Cardiovascular Disease

## 2016-10-03 ENCOUNTER — Other Ambulatory Visit: Payer: Medicare Other

## 2016-10-03 ENCOUNTER — Ambulatory Visit (INDEPENDENT_AMBULATORY_CARE_PROVIDER_SITE_OTHER): Payer: Medicare Other | Admitting: Cardiovascular Disease

## 2016-10-03 VITALS — BP 122/50 | HR 66 | Ht 70.0 in | Wt 186.0 lb

## 2016-10-03 DIAGNOSIS — E782 Mixed hyperlipidemia: Secondary | ICD-10-CM

## 2016-10-03 DIAGNOSIS — I482 Chronic atrial fibrillation, unspecified: Secondary | ICD-10-CM

## 2016-10-03 MED ORDER — ASPIRIN 81 MG PO TABS: 81.0000 mg | ORAL_TABLET | Freq: Every day | ORAL | Status: DC

## 2016-10-03 NOTE — Progress Notes (Signed)
Cardiology Office Note   Date:  10/03/2016   ID:  Christian Maxwell, DOB [birth year], MRN [id number]  PCP:  Ria Bush, MD  Cardiologist:  Mertie Moores, MD   Chief Complaint  Patient presents with  . Atrial Fibrillation   1 . Atrial fibrillation 2. Hyperlipidemia 3. Premature ventricular contractions 4. Hypothyroidism   Christian Maxwell is a 83 year old gentleman with a history of atrial fibrillation, hyperlipidemia, and a history of PVCs. He exercises on a regular basis. He has not had any episodes of chest pain or shortness of breath. He denies any syncope or presyncope.  He's doing much better. He was having some shortness of breath earlier in the year and the end of last year.  Dec. 4, 2013 :  He presents today with some right sided chest wall pain. The discomfort increases with deep breath, right arm movement and with twisting of his torso. No dyspnea. No angina.   May 22, 2011: He's doing very well at this point. He is no longer having any episodes of chest wall pain. He still rides a stationary bike for 30 minutes twice a day and does not have any episodes of pain.  Feb. 5, 2015:  Christian Maxwell has no complaints. He was seen with his wife. It is clear that he is gradually getting more and more demented. His wife states that he breathes heavily when climbing stairs. He's able to do all of his normal activities without any significant difficulties. He does notice that he'll typically develop some chest pain that she's late taking his second metoprolol dose of the day.   Sep 30, 2013:  Christian Maxwell is doing well. Stays busy.   Oct. 5, 2015:   Oddis continues to have DOE. His wife says that he has lots of dyspnea - he denies it.    Sep 28, 2014:  VEDANSH Maxwell is a 83 y.o. male who presents for atrial fib .  He has had more dyspnea and some chest pain  - especially with exertion.  Has difficulty describing his chest pain .   We have suggested a stress test  in the past.  He now agrees to do the test.   Feb.   10 , 2017: Doing well. No complaints.  Saw Richardson Dopp last month. Echo showed normal LV function.   Has marked LAE.   Trace MR, moderate TR .   Oct. 5 2017:  Christian Maxwell is doing well. He's not having any episodes of chest pain or shortness of breath. He has been taking his medications.  04/14/2016:   Christian Maxwell is seen today for follow-up of his chronic atrial fibrillation and chronic episodes of chest discomfort.  He was seen in the emergency room on November 20 with episode of chest pain. He had a [place] study earlier this week which was normal. He has no ischemia.  EF is 60%.  Chest pain  is feeling better.  Oct 03, 2016:  Christian Maxwell has had a rough several months His wife, [name], died of a CVA.  His brother and sister both died recent.   Doing ok from a cardiac standpoint.  No CP     Past Medical History:  Diagnosis Date  . Abscess 7/13   abd abscess  . Cancer [facility])    colon cancer  . Chronic atrial fibrillation ([place])   . History of colon cancer 2000   T3N0 s/p colectomy  . History of echocardiogram    a. Echo 2/13:  Mild LVH, EF  55-60%, mild MR, moderate LAE, mild to moderate RAE;  b. Echo 1/17: mild LVH, vigorous LVF, EF 65-70%, no RWMA, trivial AI, trivial MR, severe LAE, mild RAE, mod TR, PASP 32 mmHg  . History of stress test    a. Cardiolite in 9/04: EF 67%, no ischemia.;  b. Myoview 6/16: no ischemia   . Hx of adenomatous colonic polyps   . Hyperlipidemia   . Hypertension   . METHICILLIN RESISTANT STAPHYLOCOCCUS AUREUS INFECTION 12/12/2006   Annotation: stitch abcess in lower abdomen after colon cancer resection Qualifier: Diagnosis of  By: [name] MD, [name]    . Myocardial infarction ([place]) 1985  . Postoperative stitch abscess 07/19/2011  . PVC's (premature ventricular contractions)   . Syncope and collapse    pt denies  . Thyroid disease    followed by [name]    Past Surgical History:  Procedure  Laterality Date  . COLECTOMY  2000   6 inches removed  . COLONOSCOPY  2009   polyp, h/o cancer [name])  . EYE SURGERY     cataract surgery both eyes- pts ates he has never had cataract surgery 10-26-15  . HERNIA REPAIR  [id number]   DB umbilical hernia  . INCISE AND DRAIN ABCESS  2009,2010,2011,2012  . INCISION AND DRAINAGE Right 04/02/2013   Procedure: INCISION AND DRAINAGE RIGHT KNEE HEMATOMA;  Surgeon: [name], MD;  Location: [facility];  Service: Orthopedics;  Laterality: Right;  . INCISION AND DRAINAGE ABSCESS N/A 09/20/2012   Procedure: INCISION AND DRAINAGE SUPRAPUBIC ABSCESS;  Surgeon: [name], MD;  Location: [facility];  Service: General;  Laterality: N/A;  . INCISION AND DRAINAGE ABSCESS Left 02/28/2013   Procedure: INCISION AND DRAINAGE LEFT ARM ABSCESS;  Surgeon: [name], MD;  Location: [facility];  Service: General;  Laterality: Left;  . LUMBAR LAMINECTOMY  1977  . MASS EXCISION  03/07/2012   EXCISION MASS;  Surgeon: [name], MD;  Laterality: Right;  removal mass posterior right arm  . POLYPECTOMY    . WOUND EXPLORATION  2006   For possible stitch abscess Dr [name]     Current Outpatient Prescriptions  Medication Sig Dispense Refill  . aspirin 81 MG tablet Take 162 mg by mouth daily.     [name] donepezil (ARICEPT) 10 MG tablet Take 10 mg by mouth at bedtime.    . fish oil-omega-3 fatty acids 1000 MG capsule Take 1 g by mouth daily.     . folic acid (FOLVITE) 1 MG tablet Take 1 mg by mouth daily.    . metoprolol tartrate (LOPRESSOR) 25 MG tablet Take 1 tablet (25 mg total) by mouth 2 (two) times daily. 60 tablet 11  . mupirocin cream (BACTROBAN) 2 % Apply 1 application topically 2 (two) times daily. 30 g 1  . nitroGLYCERIN (NITROSTAT) 0.4 MG SL tablet Place 1 tablet (0.4 mg total) under the tongue every 5 (five) minutes as needed for chest pain. 25 tablet 6  . simvastatin (ZOCOR) 40 MG tablet Take 1 tablet (40 mg total) by mouth every  morning. 90 tablet 3  . Thiamine HCl (VITAMIN B-1 PO) Take 1 tablet by mouth daily.     . tizanidine (ZANAFLEX) 2 MG capsule Take 1 capsule (2 mg total) by mouth at bedtime as needed for muscle spasms. 30 capsule 2  . warfarin (COUMADIN) 5 MG tablet Take as directed by Coumadin Clinic 30 tablet 0  . zolpidem (AMBIEN) 10 MG tablet Take 1 tablet (10  mg total) by mouth at bedtime as needed for sleep.     No current facility-administered medications for this visit.     Allergies:   Patient has no known allergies.    Social History:  The patient  reports that he quit smoking about 39 years ago. He quit smokeless tobacco use about 39 years ago. His smokeless tobacco use included Chew. He reports that he does not drink alcohol or use drugs.   Family History:  The patient's family history includes CAD (age of onset: 74) in his brother; CAD (age of onset: 68) in his father; Colon cancer in his cousin; Dementia in his brother and father; Dementia (age of onset: 21) in his sister; Diabetes in his brother; Hypertension in his father; Stroke in his brother, brother, father, and mother; Stroke (age of onset: 54) in his sister.    ROS:  Please see the history of present illness.    Review of Systems: Constitutional:  denies fever, chills, diaphoresis, appetite change and fatigue.  HEENT: denies photophobia, eye pain, redness, hearing loss, ear pain, congestion, sore throat, rhinorrhea, sneezing, neck pain, neck stiffness and tinnitus.  Respiratory: denies SOB, DOE, cough, chest tightness, and wheezing.  Cardiovascular: denies chest pain, palpitations and leg swelling.  Gastrointestinal: denies nausea, vomiting, abdominal pain, diarrhea, constipation, blood in stool.  Genitourinary: denies dysuria, urgency, frequency, hematuria, flank pain and difficulty urinating.  Musculoskeletal: denies  myalgias, back pain, joint swelling, arthralgias and gait problem.   Skin: denies pallor, rash and wound.    Neurological: denies dizziness, seizures, syncope, weakness, light-headedness, numbness and headaches.   Hematological: denies adenopathy, easy bruising, personal or family bleeding history.  Psychiatric/ Behavioral: denies suicidal ideation, mood changes, confusion, nervousness, sleep disturbance and agitation.       All other systems are reviewed and negative.    PHYSICAL EXAM: VS:  BP (!) 122/50   Pulse 66   Ht 5\' 10"  (1.778 m)   Wt 186 lb (84.4 kg)   SpO2 98%   BMI 26.69 kg/m  , BMI Body mass index is 26.69 kg/m. GEN: Well nourished, well developed, in no acute distress  HEENT: normal  Neck: no JVD, carotid bruits, or masses Cardiac: Irreg. Irreg.  no murmurs, rubs, or gallops,no edema  Respiratory:  clear to auscultation bilaterally, normal work of breathing GI: soft, nontender, nondistended, + BS MS: no deformity or atrophy  Skin: warm and dry, no rash Neuro:  Strength and sensation are intact Psych: normal   EKG:  EKG is not ordered today.    Recent Labs: 11/05/2015: TSH 1.66 04/03/2016: BUN 13; Creatinine, Ser 0.82; Hemoglobin 14.3; Magnesium 2.2; Platelets 249; Potassium 4.1; Sodium 139    Lipid Panel    Component Value Date/Time   CHOL 144 11/05/2015 0947   TRIG 183.0 (H) 11/05/2015 0947   HDL 40.90 11/05/2015 0947   CHOLHDL 4 11/05/2015 0947   VLDL 36.6 11/05/2015 0947   LDLCALC 66 11/05/2015 0947      Wt Readings from Last 3 Encounters:  10/03/16 186 lb (84.4 kg)  05/24/16 178 lb (80.7 kg)  04/14/16 180 lb 12.8 oz (82 kg)      Other studies Reviewed: Additional studies/ records that were reviewed today include: . Review of the above records demonstrates:    ASSESSMENT AND PLAN:  1 . Atrial fibrillation- his rate  is very well-controlled. Continue current medications. 2. Hyperlipidemia 3. Premature ventricular contractions 4. Hypothyroidism - followed by primary MD    Current medicines are  reviewed at length with the patient today.   The patient does not have concerns regarding medicines.  The following changes have been made:  no change  Labs/ tests ordered today include:  No orders of the defined types were placed in this encounter.    Disposition:   FU with me in 6 months      Mertie Moores, MD  10/03/2016 8:51 AM    [facility] [place], [place], [place]  [address] Phone: [phone]; Fax: [phone]

## 2016-10-03 NOTE — Patient Instructions (Signed)
Medication Instructions:  1) DECREASE ASPIRIN to 81 mg daily  Labwork: None  Testing/Procedures: None  Follow-Up: Your physician wants you to follow-up in: 6 months with Dr. Acie Fredrickson. You will receive a reminder letter in the mail two months in advance. If you don't receive a letter, please call our office to schedule the follow-up appointment.   Any Other Special Instructions Will Be Listed Below (If Applicable).     If you need a refill on your cardiac medications before your next appointment, please call your pharmacy.

## 2016-10-04 ENCOUNTER — Other Ambulatory Visit: Payer: Medicare Other | Admitting: *Deleted

## 2016-10-04 DIAGNOSIS — E782 Mixed hyperlipidemia: Secondary | ICD-10-CM

## 2016-10-04 DIAGNOSIS — I482 Chronic atrial fibrillation, unspecified: Secondary | ICD-10-CM

## 2016-10-04 LAB — COMPREHENSIVE METABOLIC PANEL
A/G RATIO: 1.8 (ref 1.2–2.2)
ALBUMIN: 4.2 g/dL (ref 3.5–4.7)
ALK PHOS: 57 IU/L (ref 39–117)
ALT: 23 IU/L (ref 0–44)
AST: 23 IU/L (ref 0–40)
BILIRUBIN TOTAL: 0.9 mg/dL (ref 0.0–1.2)
BUN / CREAT RATIO: 17 (ref 10–24)
BUN: 15 mg/dL (ref 8–27)
CHLORIDE: 102 mmol/L (ref 96–106)
CO2: 26 mmol/L (ref 18–29)
Calcium: 9.8 mg/dL (ref 8.6–10.2)
Creatinine, Ser: 0.87 mg/dL (ref 0.76–1.27)
GFR calc non Af Amer: 81 mL/min/{1.73_m2} (ref >59)
GFR, EST AFRICAN AMERICAN: 94 mL/min/{1.73_m2} (ref >59)
GLOBULIN, TOTAL: 2.3 g/dL (ref 1.5–4.5)
Glucose: 100 mg/dL — ABNORMAL HIGH (ref 65–99)
Potassium: 4.6 mmol/L (ref 3.5–5.2)
Sodium: 142 mmol/L (ref 134–144)
Total Protein: 6.5 g/dL (ref 6.0–8.5)

## 2016-10-04 LAB — LIPID PANEL
CHOLESTEROL TOTAL: 147 mg/dL (ref 100–199)
Chol/HDL Ratio: 3.6 ratio (ref 0.0–5.0)
HDL: 41 mg/dL (ref >39)
LDL Calculated: 71 mg/dL (ref 0–99)
Triglycerides: 174 mg/dL — ABNORMAL HIGH (ref 0–149)
VLDL CHOLESTEROL CAL: 35 mg/dL (ref 5–40)

## 2016-10-04 NOTE — Addendum Note (Signed)
Addended by: Emmaline Life on: 10/04/2016 08:03 AM   Modules accepted: Orders

## 2016-10-25 DIAGNOSIS — L02211 Cutaneous abscess of abdominal wall: Secondary | ICD-10-CM | POA: Diagnosis not present

## 2016-12-11 ENCOUNTER — Encounter (INDEPENDENT_AMBULATORY_CARE_PROVIDER_SITE_OTHER): Payer: Self-pay

## 2016-12-11 ENCOUNTER — Ambulatory Visit (INDEPENDENT_AMBULATORY_CARE_PROVIDER_SITE_OTHER): Payer: Medicare Other | Admitting: *Deleted

## 2016-12-11 ENCOUNTER — Other Ambulatory Visit: Payer: Self-pay | Admitting: Cardiovascular Disease

## 2016-12-11 DIAGNOSIS — I482 Chronic atrial fibrillation, unspecified: Secondary | ICD-10-CM

## 2016-12-11 DIAGNOSIS — Z5181 Encounter for therapeutic drug level monitoring: Secondary | ICD-10-CM | POA: Diagnosis not present

## 2016-12-11 LAB — POCT INR: INR: 2.1

## 2016-12-11 MED ORDER — WARFARIN SODIUM 5 MG PO TABS: ORAL_TABLET | ORAL | 0 refills | Status: DC

## 2016-12-18 ENCOUNTER — Telehealth: Payer: Self-pay | Admitting: Family Medicine

## 2016-12-18 NOTE — Telephone Encounter (Signed)
Left pt message asking to call Ebony Hail back directly at [phone] to schedule AWV + labs with Katha Cabal and CPE with PCP.  *NOTE* Last AWV 11/05/15

## 2016-12-25 ENCOUNTER — Ambulatory Visit (INDEPENDENT_AMBULATORY_CARE_PROVIDER_SITE_OTHER): Payer: Medicare Other | Admitting: Pharmacist

## 2016-12-25 DIAGNOSIS — I480 Paroxysmal atrial fibrillation: Secondary | ICD-10-CM | POA: Diagnosis not present

## 2016-12-25 DIAGNOSIS — I482 Chronic atrial fibrillation, unspecified: Secondary | ICD-10-CM

## 2016-12-25 DIAGNOSIS — Z6826 Body mass index (BMI) 26.0-26.9, adult: Secondary | ICD-10-CM | POA: Diagnosis not present

## 2016-12-25 DIAGNOSIS — Z5181 Encounter for therapeutic drug level monitoring: Secondary | ICD-10-CM | POA: Diagnosis not present

## 2016-12-25 DIAGNOSIS — C189 Malignant neoplasm of colon, unspecified: Secondary | ICD-10-CM | POA: Diagnosis not present

## 2016-12-25 DIAGNOSIS — F0280 Dementia in other diseases classified elsewhere without behavioral disturbance: Secondary | ICD-10-CM | POA: Diagnosis not present

## 2016-12-25 DIAGNOSIS — C439 Malignant melanoma of skin, unspecified: Secondary | ICD-10-CM | POA: Diagnosis not present

## 2016-12-25 DIAGNOSIS — E784 Other hyperlipidemia: Secondary | ICD-10-CM | POA: Diagnosis not present

## 2016-12-25 DIAGNOSIS — E0500 Thyrotoxicosis with diffuse goiter without thyrotoxic crisis or storm: Secondary | ICD-10-CM | POA: Diagnosis not present

## 2016-12-25 DIAGNOSIS — R740 Nonspecific elevation of levels of transaminase and lactic acid dehydrogenase [LDH]: Secondary | ICD-10-CM | POA: Diagnosis not present

## 2016-12-25 DIAGNOSIS — Z7901 Long term (current) use of anticoagulants: Secondary | ICD-10-CM | POA: Diagnosis not present

## 2016-12-25 LAB — POCT INR: INR: 2.6

## 2016-12-27 DIAGNOSIS — L02211 Cutaneous abscess of abdominal wall: Secondary | ICD-10-CM | POA: Diagnosis not present

## 2017-01-06 ENCOUNTER — Other Ambulatory Visit: Payer: Self-pay | Admitting: Physician Assistant

## 2017-01-06 ENCOUNTER — Other Ambulatory Visit: Payer: Self-pay | Admitting: Cardiovascular Disease

## 2017-01-22 ENCOUNTER — Ambulatory Visit (INDEPENDENT_AMBULATORY_CARE_PROVIDER_SITE_OTHER): Payer: Medicare Other | Admitting: *Deleted

## 2017-01-22 ENCOUNTER — Encounter (INDEPENDENT_AMBULATORY_CARE_PROVIDER_SITE_OTHER): Payer: Self-pay

## 2017-01-22 DIAGNOSIS — I482 Chronic atrial fibrillation, unspecified: Secondary | ICD-10-CM

## 2017-01-22 DIAGNOSIS — Z5181 Encounter for therapeutic drug level monitoring: Secondary | ICD-10-CM | POA: Diagnosis not present

## 2017-01-22 LAB — POCT INR: INR: 2.2

## 2017-01-23 NOTE — Telephone Encounter (Signed)
Spoke to pt. He states he has moved and found another MD closer to his home.  Dr. Danise Mina removed as PCP in Epic

## 2017-02-07 ENCOUNTER — Other Ambulatory Visit: Payer: Self-pay | Admitting: Cardiovascular Disease

## 2017-02-19 ENCOUNTER — Ambulatory Visit (INDEPENDENT_AMBULATORY_CARE_PROVIDER_SITE_OTHER): Payer: Medicare Other

## 2017-02-19 DIAGNOSIS — I482 Chronic atrial fibrillation, unspecified: Secondary | ICD-10-CM

## 2017-02-19 DIAGNOSIS — Z5181 Encounter for therapeutic drug level monitoring: Secondary | ICD-10-CM

## 2017-02-19 LAB — POCT INR: INR: 2.2

## 2017-02-21 DIAGNOSIS — L02211 Cutaneous abscess of abdominal wall: Secondary | ICD-10-CM | POA: Diagnosis not present

## 2017-03-21 DIAGNOSIS — M79605 Pain in left leg: Secondary | ICD-10-CM | POA: Diagnosis not present

## 2017-03-21 DIAGNOSIS — L02211 Cutaneous abscess of abdominal wall: Secondary | ICD-10-CM | POA: Diagnosis not present

## 2017-03-21 DIAGNOSIS — M79604 Pain in right leg: Secondary | ICD-10-CM | POA: Diagnosis not present

## 2017-03-27 ENCOUNTER — Ambulatory Visit (INDEPENDENT_AMBULATORY_CARE_PROVIDER_SITE_OTHER): Payer: Medicare Other | Admitting: *Deleted

## 2017-03-27 ENCOUNTER — Encounter (INDEPENDENT_AMBULATORY_CARE_PROVIDER_SITE_OTHER): Payer: Self-pay

## 2017-03-27 DIAGNOSIS — I482 Chronic atrial fibrillation, unspecified: Secondary | ICD-10-CM

## 2017-03-27 DIAGNOSIS — Z5181 Encounter for therapeutic drug level monitoring: Secondary | ICD-10-CM | POA: Diagnosis not present

## 2017-03-27 LAB — POCT INR: INR: 1.7

## 2017-03-27 NOTE — Patient Instructions (Signed)
Today take an extra 1/2 tablet, then continue  taking 1 tablet daily except 1/2 tablet on Mondays and Fridays. Recheck INR in 3 weeks. Call with any questions [phone]

## 2017-04-17 ENCOUNTER — Ambulatory Visit (INDEPENDENT_AMBULATORY_CARE_PROVIDER_SITE_OTHER): Payer: Medicare Other | Admitting: *Deleted

## 2017-04-17 DIAGNOSIS — I482 Chronic atrial fibrillation, unspecified: Secondary | ICD-10-CM

## 2017-04-17 DIAGNOSIS — Z5181 Encounter for therapeutic drug level monitoring: Secondary | ICD-10-CM

## 2017-04-17 LAB — POCT INR: INR: 1.8

## 2017-04-17 NOTE — Patient Instructions (Signed)
Today take an extra 1/2 tablet, then change your dose to 1 tablet daily except 1/2 tablet on Mondays.  Recheck INR in 2 weeks. Call with any questions [phone]

## 2017-05-03 ENCOUNTER — Ambulatory Visit (INDEPENDENT_AMBULATORY_CARE_PROVIDER_SITE_OTHER): Payer: Medicare Other | Admitting: Pharmacist

## 2017-05-03 DIAGNOSIS — Z5181 Encounter for therapeutic drug level monitoring: Secondary | ICD-10-CM

## 2017-05-03 DIAGNOSIS — I482 Chronic atrial fibrillation, unspecified: Secondary | ICD-10-CM

## 2017-05-03 LAB — POCT INR: INR: 2

## 2017-05-03 NOTE — Patient Instructions (Signed)
Description   Continue taking 1 tablet daily except 1/2 tablet on Mondays. Recheck INR in 3 weeks. Call with any questions [phone]      

## 2017-05-23 DIAGNOSIS — L02211 Cutaneous abscess of abdominal wall: Secondary | ICD-10-CM | POA: Diagnosis not present

## 2017-05-24 ENCOUNTER — Ambulatory Visit (INDEPENDENT_AMBULATORY_CARE_PROVIDER_SITE_OTHER): Payer: Medicare Other | Admitting: *Deleted

## 2017-05-24 ENCOUNTER — Other Ambulatory Visit: Payer: Self-pay | Admitting: Cardiovascular Disease

## 2017-05-24 DIAGNOSIS — Z5181 Encounter for therapeutic drug level monitoring: Secondary | ICD-10-CM

## 2017-05-24 DIAGNOSIS — I482 Chronic atrial fibrillation, unspecified: Secondary | ICD-10-CM

## 2017-05-24 LAB — POCT INR: INR: 1.9

## 2017-05-24 NOTE — Patient Instructions (Signed)
Description   Today Jan 10th take another 1/2 tablet as he has taken 1 tablet already today making total of 7.5mg   then do dose as previous instructed  1 tablet daily except 1/2 tablet only on  Mondays.  Recheck INR in 2 weeks. Call with any questions [phone]

## 2017-06-07 ENCOUNTER — Ambulatory Visit (INDEPENDENT_AMBULATORY_CARE_PROVIDER_SITE_OTHER): Payer: Medicare Other | Admitting: Pharmacist

## 2017-06-07 DIAGNOSIS — Z5181 Encounter for therapeutic drug level monitoring: Secondary | ICD-10-CM

## 2017-06-07 DIAGNOSIS — I482 Chronic atrial fibrillation, unspecified: Secondary | ICD-10-CM

## 2017-06-07 LAB — POCT INR: INR: 1.7

## 2017-06-07 NOTE — Patient Instructions (Signed)
Description   Take an extra 1/2 tablet today, then start taking 1 tablet every day. Recheck INR in 2 weeks. Call with any questions [phone]

## 2017-06-22 ENCOUNTER — Encounter (INDEPENDENT_AMBULATORY_CARE_PROVIDER_SITE_OTHER): Payer: Self-pay

## 2017-06-22 ENCOUNTER — Ambulatory Visit (INDEPENDENT_AMBULATORY_CARE_PROVIDER_SITE_OTHER): Payer: Medicare Other | Admitting: *Deleted

## 2017-06-22 DIAGNOSIS — Z5181 Encounter for therapeutic drug level monitoring: Secondary | ICD-10-CM | POA: Diagnosis not present

## 2017-06-22 DIAGNOSIS — I482 Chronic atrial fibrillation, unspecified: Secondary | ICD-10-CM

## 2017-06-22 LAB — POCT INR: INR: 1.9

## 2017-06-22 NOTE — Patient Instructions (Addendum)
Description   Take an extra 1/2 tablet today, then start taking 1 tablet every day except 1.5 tablets Tuesdays. Recheck INR in 2 weeks on a Thursday. Call with any questions [phone]

## 2017-07-05 ENCOUNTER — Ambulatory Visit (INDEPENDENT_AMBULATORY_CARE_PROVIDER_SITE_OTHER): Payer: Medicare Other | Admitting: *Deleted

## 2017-07-05 DIAGNOSIS — I482 Chronic atrial fibrillation, unspecified: Secondary | ICD-10-CM

## 2017-07-05 DIAGNOSIS — Z5181 Encounter for therapeutic drug level monitoring: Secondary | ICD-10-CM

## 2017-07-05 LAB — POCT INR: INR: 1.9

## 2017-07-05 NOTE — Patient Instructions (Signed)
Description   Take an extra 1/2 tablet today, then start taking 1 tablet every day except 1.5 tablets Tuesdays. Recheck INR in 11 days. Call with any questions [phone]

## 2017-07-16 ENCOUNTER — Ambulatory Visit (INDEPENDENT_AMBULATORY_CARE_PROVIDER_SITE_OTHER): Payer: Medicare Other | Admitting: Pharmacist

## 2017-07-16 DIAGNOSIS — I482 Chronic atrial fibrillation, unspecified: Secondary | ICD-10-CM

## 2017-07-16 DIAGNOSIS — Z5181 Encounter for therapeutic drug level monitoring: Secondary | ICD-10-CM | POA: Diagnosis not present

## 2017-07-16 LAB — POCT INR: INR: 2.8

## 2017-07-16 NOTE — Patient Instructions (Signed)
Continue taking 1 tablet every day. Recheck INR in 2 weeks. Call with any questions [phone]

## 2017-07-18 DIAGNOSIS — L02211 Cutaneous abscess of abdominal wall: Secondary | ICD-10-CM | POA: Diagnosis not present

## 2017-07-30 ENCOUNTER — Ambulatory Visit (INDEPENDENT_AMBULATORY_CARE_PROVIDER_SITE_OTHER): Payer: Medicare Other | Admitting: *Deleted

## 2017-07-30 DIAGNOSIS — I482 Chronic atrial fibrillation, unspecified: Secondary | ICD-10-CM

## 2017-07-30 DIAGNOSIS — Z5181 Encounter for therapeutic drug level monitoring: Secondary | ICD-10-CM | POA: Diagnosis not present

## 2017-07-30 LAB — POCT INR: INR: 2.6

## 2017-07-30 NOTE — Patient Instructions (Signed)
Description    Continue taking 1 tablet every day. Recheck INR in 3 weeks. Call with any questions [phone]

## 2017-08-01 DIAGNOSIS — C439 Malignant melanoma of skin, unspecified: Secondary | ICD-10-CM | POA: Diagnosis not present

## 2017-08-01 DIAGNOSIS — E0500 Thyrotoxicosis with diffuse goiter without thyrotoxic crisis or storm: Secondary | ICD-10-CM | POA: Diagnosis not present

## 2017-08-01 DIAGNOSIS — I2510 Atherosclerotic heart disease of native coronary artery without angina pectoris: Secondary | ICD-10-CM | POA: Diagnosis not present

## 2017-08-01 DIAGNOSIS — I480 Paroxysmal atrial fibrillation: Secondary | ICD-10-CM | POA: Diagnosis not present

## 2017-08-01 DIAGNOSIS — Z7901 Long term (current) use of anticoagulants: Secondary | ICD-10-CM | POA: Diagnosis not present

## 2017-08-01 DIAGNOSIS — Z1389 Encounter for screening for other disorder: Secondary | ICD-10-CM | POA: Diagnosis not present

## 2017-08-01 DIAGNOSIS — G8310 Monoplegia of lower limb affecting unspecified side: Secondary | ICD-10-CM | POA: Diagnosis not present

## 2017-08-01 DIAGNOSIS — F0280 Dementia in other diseases classified elsewhere without behavioral disturbance: Secondary | ICD-10-CM | POA: Diagnosis not present

## 2017-08-01 DIAGNOSIS — Z6826 Body mass index (BMI) 26.0-26.9, adult: Secondary | ICD-10-CM | POA: Diagnosis not present

## 2017-08-01 DIAGNOSIS — R740 Nonspecific elevation of levels of transaminase and lactic acid dehydrogenase [LDH]: Secondary | ICD-10-CM | POA: Diagnosis not present

## 2017-08-01 DIAGNOSIS — C189 Malignant neoplasm of colon, unspecified: Secondary | ICD-10-CM | POA: Diagnosis not present

## 2017-08-01 DIAGNOSIS — E7849 Other hyperlipidemia: Secondary | ICD-10-CM | POA: Diagnosis not present

## 2017-08-04 ENCOUNTER — Other Ambulatory Visit: Payer: Self-pay | Admitting: Cardiovascular Disease

## 2017-08-20 ENCOUNTER — Ambulatory Visit (INDEPENDENT_AMBULATORY_CARE_PROVIDER_SITE_OTHER): Payer: Medicare Other | Admitting: *Deleted

## 2017-08-20 ENCOUNTER — Encounter (INDEPENDENT_AMBULATORY_CARE_PROVIDER_SITE_OTHER): Payer: Self-pay

## 2017-08-20 DIAGNOSIS — I482 Chronic atrial fibrillation, unspecified: Secondary | ICD-10-CM

## 2017-08-20 DIAGNOSIS — Z5181 Encounter for therapeutic drug level monitoring: Secondary | ICD-10-CM | POA: Diagnosis not present

## 2017-08-20 LAB — POCT INR: INR: 4.2

## 2017-08-20 NOTE — Patient Instructions (Signed)
Description   Do not take coumadin tomorrow April 9th as he has taken today then on April 10th take 1/2 tablet then  continue taking 1 tablet every day. Recheck INR in 2 weeks. Call with any questions [phone] Do good serving of greens today and tomorrow

## 2017-09-03 ENCOUNTER — Ambulatory Visit (INDEPENDENT_AMBULATORY_CARE_PROVIDER_SITE_OTHER): Payer: Medicare Other | Admitting: Pharmacist

## 2017-09-03 DIAGNOSIS — I482 Chronic atrial fibrillation, unspecified: Secondary | ICD-10-CM

## 2017-09-03 DIAGNOSIS — Z5181 Encounter for therapeutic drug level monitoring: Secondary | ICD-10-CM

## 2017-09-03 LAB — POCT INR: INR: 3.2

## 2017-09-03 NOTE — Patient Instructions (Signed)
Description   Take 1/2 tablet tomorrow, then continue taking 1 tablet every day. Recheck INR in 3 weeks. Call with any questions [phone]

## 2017-09-18 DIAGNOSIS — H900 Conductive hearing loss, bilateral: Secondary | ICD-10-CM | POA: Diagnosis not present

## 2017-09-18 DIAGNOSIS — Z87891 Personal history of nicotine dependence: Secondary | ICD-10-CM | POA: Diagnosis not present

## 2017-09-18 DIAGNOSIS — H6123 Impacted cerumen, bilateral: Secondary | ICD-10-CM | POA: Diagnosis not present

## 2017-09-24 ENCOUNTER — Encounter (INDEPENDENT_AMBULATORY_CARE_PROVIDER_SITE_OTHER): Payer: Self-pay

## 2017-09-24 ENCOUNTER — Ambulatory Visit (INDEPENDENT_AMBULATORY_CARE_PROVIDER_SITE_OTHER): Payer: Medicare Other | Admitting: Pharmacist

## 2017-09-24 DIAGNOSIS — Z5181 Encounter for therapeutic drug level monitoring: Secondary | ICD-10-CM

## 2017-09-24 DIAGNOSIS — I482 Chronic atrial fibrillation, unspecified: Secondary | ICD-10-CM

## 2017-09-24 LAB — POCT INR: INR: 1.8

## 2017-09-24 NOTE — Patient Instructions (Signed)
Description   Take an extra 1/2 tablet today, then continue taking 1 tablet every day. Recheck INR in 3 weeks. Call with any questions [phone]

## 2017-09-26 DIAGNOSIS — L02211 Cutaneous abscess of abdominal wall: Secondary | ICD-10-CM | POA: Diagnosis not present

## 2017-10-01 ENCOUNTER — Other Ambulatory Visit: Payer: Self-pay | Admitting: Cardiovascular Disease

## 2017-10-01 DIAGNOSIS — H6122 Impacted cerumen, left ear: Secondary | ICD-10-CM | POA: Diagnosis not present

## 2017-10-15 ENCOUNTER — Ambulatory Visit (INDEPENDENT_AMBULATORY_CARE_PROVIDER_SITE_OTHER): Payer: Medicare Other | Admitting: *Deleted

## 2017-10-15 DIAGNOSIS — I482 Chronic atrial fibrillation, unspecified: Secondary | ICD-10-CM

## 2017-10-15 DIAGNOSIS — Z5181 Encounter for therapeutic drug level monitoring: Secondary | ICD-10-CM

## 2017-10-15 LAB — POCT INR: INR: 4 — AB (ref 2.0–3.0)

## 2017-10-15 NOTE — Patient Instructions (Signed)
Description   Hold tomorrow's dose (already taken today's dose), then continue taking 1 tablet every day. Recheck INR in 2 weeks. Call with any questions [phone]

## 2017-10-29 ENCOUNTER — Ambulatory Visit (INDEPENDENT_AMBULATORY_CARE_PROVIDER_SITE_OTHER): Payer: Medicare Other | Admitting: Pharmacist

## 2017-10-29 DIAGNOSIS — I482 Chronic atrial fibrillation, unspecified: Secondary | ICD-10-CM

## 2017-10-29 DIAGNOSIS — Z5181 Encounter for therapeutic drug level monitoring: Secondary | ICD-10-CM | POA: Diagnosis not present

## 2017-10-29 LAB — POCT INR: INR: 2.8 (ref 2.0–3.0)

## 2017-10-29 NOTE — Patient Instructions (Signed)
Description   Continue taking 1 tablet every day. Recheck INR in 3 weeks. Call with any questions [phone]

## 2017-11-19 ENCOUNTER — Ambulatory Visit (INDEPENDENT_AMBULATORY_CARE_PROVIDER_SITE_OTHER): Payer: Medicare Other | Admitting: *Deleted

## 2017-11-19 DIAGNOSIS — Z5181 Encounter for therapeutic drug level monitoring: Secondary | ICD-10-CM | POA: Diagnosis not present

## 2017-11-19 DIAGNOSIS — I482 Chronic atrial fibrillation, unspecified: Secondary | ICD-10-CM

## 2017-11-19 LAB — POCT INR: INR: 3.1 — AB (ref 2.0–3.0)

## 2017-11-19 NOTE — Patient Instructions (Signed)
Description   Today take 1/2 tablet then continue taking 1 tablet every day. Recheck INR in 3 weeks. Call with any questions [phone]

## 2017-11-27 ENCOUNTER — Other Ambulatory Visit: Payer: Self-pay | Admitting: Cardiovascular Disease

## 2017-12-10 ENCOUNTER — Ambulatory Visit (INDEPENDENT_AMBULATORY_CARE_PROVIDER_SITE_OTHER): Payer: Medicare Other

## 2017-12-10 DIAGNOSIS — I482 Chronic atrial fibrillation, unspecified: Secondary | ICD-10-CM

## 2017-12-10 DIAGNOSIS — Z5181 Encounter for therapeutic drug level monitoring: Secondary | ICD-10-CM

## 2017-12-10 LAB — POCT INR: INR: 3.5 — AB (ref 2.0–3.0)

## 2017-12-10 NOTE — Patient Instructions (Signed)
Description   Skip tomorrow's dosage of Coumadin, then start taking 1 tablet daily except 1/2 tablet on Mondays. Recheck INR in 2 weeks. Call with any questions [phone]

## 2017-12-12 ENCOUNTER — Other Ambulatory Visit: Payer: Self-pay | Admitting: Interventional Cardiology

## 2017-12-24 ENCOUNTER — Ambulatory Visit (INDEPENDENT_AMBULATORY_CARE_PROVIDER_SITE_OTHER): Payer: Medicare Other | Admitting: Pharmacist

## 2017-12-24 DIAGNOSIS — Z5181 Encounter for therapeutic drug level monitoring: Secondary | ICD-10-CM

## 2017-12-24 DIAGNOSIS — I482 Chronic atrial fibrillation, unspecified: Secondary | ICD-10-CM

## 2017-12-24 LAB — POCT INR: INR: 2.7 (ref 2.0–3.0)

## 2017-12-24 NOTE — Patient Instructions (Signed)
Description   Continue taking 1 tablet daily except 1/2 tablet on Mondays. Recheck INR in 3 weeks. Call with any questions [phone]

## 2018-01-15 ENCOUNTER — Ambulatory Visit (INDEPENDENT_AMBULATORY_CARE_PROVIDER_SITE_OTHER): Payer: Medicare Other | Admitting: *Deleted

## 2018-01-15 DIAGNOSIS — I482 Chronic atrial fibrillation, unspecified: Secondary | ICD-10-CM

## 2018-01-15 DIAGNOSIS — Z5181 Encounter for therapeutic drug level monitoring: Secondary | ICD-10-CM

## 2018-01-15 LAB — POCT INR: INR: 2.9 (ref 2.0–3.0)

## 2018-01-15 NOTE — Patient Instructions (Signed)
Description   Continue taking 1 tablet daily except 1/2 tablet on Mondays. Recheck INR in 4 weeks. Call with any questions [phone]

## 2018-01-20 ENCOUNTER — Emergency Department (HOSPITAL_COMMUNITY): Payer: Medicare Other

## 2018-01-20 ENCOUNTER — Inpatient Hospital Stay (HOSPITAL_COMMUNITY)
Admission: EM | Admit: 2018-01-20 | Discharge: 2018-01-23 | DRG: 064 | Disposition: A | Discharge status: Rehabilitation Facility | Payer: Medicare Other | Attending: Internal Medicine | Admitting: Internal Medicine

## 2018-01-20 ENCOUNTER — Encounter (HOSPITAL_COMMUNITY): Payer: Self-pay | Admitting: Emergency Medicine

## 2018-01-20 ENCOUNTER — Other Ambulatory Visit: Payer: Self-pay

## 2018-01-20 DIAGNOSIS — D689 Coagulation defect, unspecified: Secondary | ICD-10-CM | POA: Diagnosis not present

## 2018-01-20 DIAGNOSIS — Z800 Family history of malignant neoplasm of digestive organs: Secondary | ICD-10-CM

## 2018-01-20 DIAGNOSIS — I10 Essential (primary) hypertension: Secondary | ICD-10-CM | POA: Diagnosis present

## 2018-01-20 DIAGNOSIS — T45515A Adverse effect of anticoagulants, initial encounter: Secondary | ICD-10-CM | POA: Diagnosis present

## 2018-01-20 DIAGNOSIS — G8929 Other chronic pain: Secondary | ICD-10-CM | POA: Diagnosis present

## 2018-01-20 DIAGNOSIS — I618 Other nontraumatic intracerebral hemorrhage: Secondary | ICD-10-CM | POA: Diagnosis not present

## 2018-01-20 DIAGNOSIS — R778 Other specified abnormalities of plasma proteins: Secondary | ICD-10-CM | POA: Diagnosis present

## 2018-01-20 DIAGNOSIS — J9811 Atelectasis: Secondary | ICD-10-CM | POA: Diagnosis present

## 2018-01-20 DIAGNOSIS — I482 Chronic atrial fibrillation: Secondary | ICD-10-CM

## 2018-01-20 DIAGNOSIS — I739 Peripheral vascular disease, unspecified: Secondary | ICD-10-CM | POA: Diagnosis present

## 2018-01-20 DIAGNOSIS — R52 Pain, unspecified: Secondary | ICD-10-CM

## 2018-01-20 DIAGNOSIS — R7989 Other specified abnormal findings of blood chemistry: Secondary | ICD-10-CM | POA: Diagnosis present

## 2018-01-20 DIAGNOSIS — I4891 Unspecified atrial fibrillation: Secondary | ICD-10-CM | POA: Diagnosis present

## 2018-01-20 DIAGNOSIS — J9601 Acute respiratory failure with hypoxia: Secondary | ICD-10-CM | POA: Diagnosis not present

## 2018-01-20 DIAGNOSIS — I2510 Atherosclerotic heart disease of native coronary artery without angina pectoris: Secondary | ICD-10-CM | POA: Diagnosis present

## 2018-01-20 DIAGNOSIS — I447 Left bundle-branch block, unspecified: Secondary | ICD-10-CM | POA: Diagnosis not present

## 2018-01-20 DIAGNOSIS — Z86010 Personal history of colonic polyps: Secondary | ICD-10-CM

## 2018-01-20 DIAGNOSIS — D72829 Elevated white blood cell count, unspecified: Secondary | ICD-10-CM | POA: Diagnosis present

## 2018-01-20 DIAGNOSIS — F0390 Unspecified dementia without behavioral disturbance: Secondary | ICD-10-CM | POA: Diagnosis present

## 2018-01-20 DIAGNOSIS — R270 Ataxia, unspecified: Secondary | ICD-10-CM

## 2018-01-20 DIAGNOSIS — Z85038 Personal history of other malignant neoplasm of large intestine: Secondary | ICD-10-CM

## 2018-01-20 DIAGNOSIS — I6523 Occlusion and stenosis of bilateral carotid arteries: Secondary | ICD-10-CM | POA: Diagnosis present

## 2018-01-20 DIAGNOSIS — Z7901 Long term (current) use of anticoagulants: Secondary | ICD-10-CM

## 2018-01-20 DIAGNOSIS — R0789 Other chest pain: Secondary | ICD-10-CM

## 2018-01-20 DIAGNOSIS — R296 Repeated falls: Secondary | ICD-10-CM | POA: Diagnosis present

## 2018-01-20 DIAGNOSIS — I619 Nontraumatic intracerebral hemorrhage, unspecified: Secondary | ICD-10-CM

## 2018-01-20 DIAGNOSIS — E785 Hyperlipidemia, unspecified: Secondary | ICD-10-CM | POA: Diagnosis present

## 2018-01-20 DIAGNOSIS — Z9049 Acquired absence of other specified parts of digestive tract: Secondary | ICD-10-CM

## 2018-01-20 DIAGNOSIS — R413 Other amnesia: Secondary | ICD-10-CM | POA: Diagnosis present

## 2018-01-20 DIAGNOSIS — R079 Chest pain, unspecified: Secondary | ICD-10-CM | POA: Diagnosis present

## 2018-01-20 DIAGNOSIS — H9190 Unspecified hearing loss, unspecified ear: Secondary | ICD-10-CM | POA: Diagnosis present

## 2018-01-20 DIAGNOSIS — R918 Other nonspecific abnormal finding of lung field: Secondary | ICD-10-CM | POA: Diagnosis not present

## 2018-01-20 DIAGNOSIS — R748 Abnormal levels of other serum enzymes: Secondary | ICD-10-CM

## 2018-01-20 DIAGNOSIS — Z8614 Personal history of Methicillin resistant Staphylococcus aureus infection: Secondary | ICD-10-CM

## 2018-01-20 DIAGNOSIS — Z7982 Long term (current) use of aspirin: Secondary | ICD-10-CM

## 2018-01-20 DIAGNOSIS — R0600 Dyspnea, unspecified: Secondary | ICD-10-CM

## 2018-01-20 DIAGNOSIS — R0602 Shortness of breath: Secondary | ICD-10-CM | POA: Diagnosis not present

## 2018-01-20 DIAGNOSIS — I480 Paroxysmal atrial fibrillation: Secondary | ICD-10-CM | POA: Diagnosis present

## 2018-01-20 DIAGNOSIS — R633 Feeding difficulties: Secondary | ICD-10-CM | POA: Diagnosis present

## 2018-01-20 DIAGNOSIS — G319 Degenerative disease of nervous system, unspecified: Secondary | ICD-10-CM | POA: Diagnosis present

## 2018-01-20 DIAGNOSIS — R0902 Hypoxemia: Secondary | ICD-10-CM | POA: Diagnosis present

## 2018-01-20 DIAGNOSIS — Z87891 Personal history of nicotine dependence: Secondary | ICD-10-CM

## 2018-01-20 DIAGNOSIS — I252 Old myocardial infarction: Secondary | ICD-10-CM

## 2018-01-20 DIAGNOSIS — Z8249 Family history of ischemic heart disease and other diseases of the circulatory system: Secondary | ICD-10-CM

## 2018-01-20 DIAGNOSIS — Z79899 Other long term (current) drug therapy: Secondary | ICD-10-CM

## 2018-01-20 LAB — I-STAT TROPONIN, ED: TROPONIN I, POC: 0.09 ng/mL — AB (ref 0.00–0.08)

## 2018-01-20 LAB — CBC WITH DIFFERENTIAL/PLATELET
BASOS ABS: 0 10*3/uL (ref 0.0–0.1)
Basophils Relative: 0 %
EOS ABS: 0.3 10*3/uL (ref 0.0–0.7)
Eosinophils Relative: 2 %
HCT: 48.7 % (ref 39.0–52.0)
Hemoglobin: 15.5 g/dL (ref 13.0–17.0)
LYMPHS PCT: 11 %
Lymphs Abs: 1.5 10*3/uL (ref 0.7–4.0)
MCH: 28.2 pg (ref 26.0–34.0)
MCHC: 31.8 g/dL (ref 30.0–36.0)
MCV: 88.5 fL (ref 78.0–100.0)
Monocytes Absolute: 1.4 10*3/uL — ABNORMAL HIGH (ref 0.1–1.0)
Monocytes Relative: 10 %
NEUTROS PCT: 77 %
Neutro Abs: 10.8 10*3/uL — ABNORMAL HIGH (ref 1.7–7.7)
Platelets: 173 10*3/uL (ref 150–400)
RBC: 5.5 MIL/uL (ref 4.22–5.81)
RDW: 13.1 % (ref 11.5–15.5)
WBC: 14 10*3/uL — AB (ref 4.0–10.5)

## 2018-01-20 LAB — BASIC METABOLIC PANEL
Anion gap: 11 (ref 5–15)
BUN: 18 mg/dL (ref 8–23)
CHLORIDE: 103 mmol/L (ref 98–111)
CO2: 23 mmol/L (ref 22–32)
CREATININE: 0.93 mg/dL (ref 0.61–1.24)
Calcium: 9.7 mg/dL (ref 8.9–10.3)
GFR calc non Af Amer: >60 mL/min (ref >60)
Glucose, Bld: 149 mg/dL — ABNORMAL HIGH (ref 70–99)
Potassium: 3.7 mmol/L (ref 3.5–5.1)
SODIUM: 137 mmol/L (ref 135–145)

## 2018-01-20 LAB — PROTIME-INR
INR: 2.16
PROTHROMBIN TIME: 23.9 s — AB (ref 11.4–15.2)

## 2018-01-20 LAB — TROPONIN I: Troponin I: 0.06 ng/mL (ref <0.03)

## 2018-01-20 LAB — BRAIN NATRIURETIC PEPTIDE: B Natriuretic Peptide: 299.1 pg/mL — ABNORMAL HIGH (ref 0.0–100.0)

## 2018-01-20 MED ORDER — FOLIC ACID 1 MG PO TABS
1.0000 mg | ORAL_TABLET | Freq: Every day | ORAL | Status: DC
  Administered 2018-01-20 – 2018-01-23 (×4): 1 mg via ORAL
  Filled 2018-01-20 (×4): qty 1

## 2018-01-20 MED ORDER — FUROSEMIDE 10 MG/ML IJ SOLN
20.0000 mg | Freq: Once | INTRAMUSCULAR | Status: AC
  Administered 2018-01-20: 20 mg via INTRAVENOUS
  Filled 2018-01-20: qty 2

## 2018-01-20 MED ORDER — ONDANSETRON HCL 4 MG/2ML IJ SOLN: 4.0000 mg | Freq: Four times a day (QID) | INTRAMUSCULAR | Status: DC | PRN

## 2018-01-20 MED ORDER — DONEPEZIL HCL 10 MG PO TABS
10.0000 mg | ORAL_TABLET | Freq: Every day | ORAL | Status: DC
  Administered 2018-01-20 – 2018-01-22 (×3): 10 mg via ORAL
  Filled 2018-01-20 (×3): qty 1

## 2018-01-20 MED ORDER — ACETAMINOPHEN 325 MG PO TABS
650.0000 mg | ORAL_TABLET | ORAL | Status: DC | PRN
  Administered 2018-01-22 – 2018-01-23 (×4): 650 mg via ORAL
  Filled 2018-01-20 (×5): qty 2

## 2018-01-20 MED ORDER — IOPAMIDOL (ISOVUE-370) INJECTION 76%
INTRAVENOUS | Status: AC
  Filled 2018-01-20: qty 100

## 2018-01-20 MED ORDER — METOPROLOL TARTRATE 25 MG PO TABS
25.0000 mg | ORAL_TABLET | Freq: Two times a day (BID) | ORAL | Status: DC
  Administered 2018-01-20 – 2018-01-23 (×6): 25 mg via ORAL
  Filled 2018-01-20 (×6): qty 1

## 2018-01-20 MED ORDER — OMEGA-3-ACID ETHYL ESTERS 1 G PO CAPS
1.0000 g | ORAL_CAPSULE | Freq: Every day | ORAL | Status: DC
  Administered 2018-01-20 – 2018-01-23 (×4): 1 g via ORAL
  Filled 2018-01-20 (×4): qty 1

## 2018-01-20 MED ORDER — ALPRAZOLAM 0.25 MG PO TABS: 0.2500 mg | ORAL_TABLET | Freq: Two times a day (BID) | ORAL | Status: DC | PRN

## 2018-01-20 MED ORDER — ASPIRIN 81 MG PO CHEW
81.0000 mg | CHEWABLE_TABLET | Freq: Every day | ORAL | Status: DC
  Administered 2018-01-20 – 2018-01-21 (×2): 81 mg via ORAL
  Filled 2018-01-20 (×2): qty 1

## 2018-01-20 MED ORDER — FUROSEMIDE 10 MG/ML IJ SOLN
20.0000 mg | Freq: Two times a day (BID) | INTRAMUSCULAR | Status: DC
  Administered 2018-01-20 – 2018-01-23 (×6): 20 mg via INTRAVENOUS
  Filled 2018-01-20 (×6): qty 2

## 2018-01-20 MED ORDER — OMEGA-3 FATTY ACIDS 1000 MG PO CAPS: 1.0000 g | ORAL_CAPSULE | Freq: Every day | ORAL | Status: DC

## 2018-01-20 MED ORDER — SIMVASTATIN 40 MG PO TABS
40.0000 mg | ORAL_TABLET | Freq: Every morning | ORAL | Status: DC
  Administered 2018-01-21 – 2018-01-22 (×2): 40 mg via ORAL
  Filled 2018-01-20 (×2): qty 1

## 2018-01-20 MED ORDER — ENOXAPARIN SODIUM 40 MG/0.4ML ~~LOC~~ SOLN: 40.0000 mg | SUBCUTANEOUS | Status: DC

## 2018-01-20 MED ORDER — WARFARIN - PHARMACIST DOSING INPATIENT: Freq: Every day | Status: DC

## 2018-01-20 MED ORDER — VITAMIN B-1 100 MG PO TABS
100.0000 mg | ORAL_TABLET | Freq: Every day | ORAL | Status: DC
  Administered 2018-01-20 – 2018-01-23 (×4): 100 mg via ORAL
  Filled 2018-01-20 (×4): qty 1

## 2018-01-20 MED ORDER — NITROGLYCERIN 0.4 MG SL SUBL
0.4000 mg | SUBLINGUAL_TABLET | SUBLINGUAL | Status: DC | PRN
  Administered 2018-01-21 – 2018-01-22 (×3): 0.4 mg via SUBLINGUAL
  Filled 2018-01-20 (×3): qty 1

## 2018-01-20 MED ORDER — ZOLPIDEM TARTRATE 5 MG PO TABS
5.0000 mg | ORAL_TABLET | Freq: Every evening | ORAL | Status: DC | PRN
  Administered 2018-01-20: 5 mg via ORAL
  Filled 2018-01-20: qty 1

## 2018-01-20 MED ORDER — WARFARIN SODIUM 2.5 MG PO TABS: 2.5000 mg | ORAL_TABLET | ORAL | Status: DC

## 2018-01-20 MED ORDER — HYDROXYZINE HCL 25 MG PO TABS: 50.0000 mg | ORAL_TABLET | Freq: Once | ORAL | Status: DC

## 2018-01-20 MED ORDER — IOPAMIDOL (ISOVUE-370) INJECTION 76%
100.0000 mL | Freq: Once | INTRAVENOUS | Status: AC | PRN
  Administered 2018-01-20: 100 mL via INTRAVENOUS

## 2018-01-20 NOTE — Progress Notes (Signed)
ANTICOAGULATION CONSULT NOTE - Initial Consult  Pharmacy Consult for warfarin Indication: afib  No Known Allergies  Patient Measurements: Height: 5\' 8"  (172.7 cm) Weight: 174 lb 2.6 oz (79 kg) IBW/kg (Calculated) : 68.4   Vital Signs: Temp: 98.3 F (36.8 C) (09/08 1839) Temp Source: Oral (09/08 1839) BP: 130/78 (09/08 1839) Pulse Rate: 75 (09/08 1839)  Labs: Recent Labs    01/20/18 1445 01/20/18 1617  HGB 15.5  --   HCT 48.7  --   PLT 173  --   LABPROT  --  23.9*  INR  --  2.16  CREATININE 0.93  --     Estimated Creatinine Clearance: 58.2 mL/min (by C-G formula based on SCr of 0.93 mg/dL).   Medical History: Past Medical History:  Diagnosis Date  . Abscess 7/13   abd abscess  . Cancer [facility])    colon cancer  . Chronic atrial fibrillation ([place])   . History of colon cancer 2000   T3N0 s/p colectomy  . History of echocardiogram    a. Echo 2/13:  Mild LVH, EF 55-60%, mild MR, moderate LAE, mild to moderate RAE;  b. Echo 1/17: mild LVH, vigorous LVF, EF 65-70%, no RWMA, trivial AI, trivial MR, severe LAE, mild RAE, mod TR, PASP 32 mmHg  . History of stress test    a. Cardiolite in 9/04: EF 67%, no ischemia.;  b. Myoview 6/16: no ischemia   . Hx of adenomatous colonic polyps   . Hyperlipidemia   . Hypertension   . METHICILLIN RESISTANT STAPHYLOCOCCUS AUREUS INFECTION 12/12/2006   Annotation: stitch abcess in lower abdomen after colon cancer resection Qualifier: Diagnosis of  By: [name] MD, [name]    . Myocardial infarction ([place]) 1985  . Postoperative stitch abscess 07/19/2011  . PVC's (premature ventricular contractions)   . Syncope and collapse    pt denies  . Thyroid disease    followed by [name]    Assessment: 84 yo male with afib on warfarin PTA. INR goal 2-3. Home dose 5mg  daily except 2.5mg  on Mondays. Last dose given today at 1000. INR on admit 2.16  Goal of Therapy:  INR 2-3 Monitor platelets by anticoagulation protocol: Yes   Plan:  No  warfarin today as already given this AM Daily INR D/c lovenox   Arlisa Leclere A. Levada Dy, PharmD, [place] Pager: [phone] Please utilize Amion for appropriate phone number to reach the unit pharmacist ([place])   01/20/2018,7:39 PM

## 2018-01-20 NOTE — ED Provider Notes (Signed)
MDM  Christian Maxwell is a 84 yo male with a complex medical history including MI (in 70), HTN, HLD, a-fib, LBBB and colon cancer who presented for acute onset chest pain this morning. Associated symptom of SOB. Pain did not improve with ASA or nitro. He is at increased risk of an acute cardiac event given his history.   Care initiated by [facility], PA-C. Patient continues to complain of pleuritic chest pain. On exam, he is hypoxic and tachypneic with decreased breath sounds.  Initial lab evaluation showed numerous abnormalities including elevated troponin, abnormal CXR and elevated BNP (no previous HF dx). CT angio negative for PE.  Inpatient hospitalization is necessary as patient continues to have chest pain, is tachypneic and hypoxic. Will consult cardiology to follow patient during admission as patient is established Dr. Acie Fredrickson at [facility]. Will consult [facility] for admission.  ED Course/Procedures   Clinical Course as of Jan 21 1755  Nancy Fetter Jan 20, 2018  1558 Case discussed with Amie Portland, PA-C. Needing admission for chest pain rule out vs PNA/PE. Received ASA and nitro without relief. Awaiting CT angio results before consults placed.   [GM]  1619 Elevated BNP at 299. No previously documented BNP and dx of heart failure. PA Ward placed ordered for Lasix 20mg  x1 prior to leaving.   [GM]  1659 Negative PE on CT angio. Opacities initially seen on CXR are more consistent with an atelectasis. Will continue with previous plan to admit patient for chest pain rule and new onset CHF. Will consult [facility] to follow patient during medical admission.   [GM]  1730 Case discussed with Dr. Hassell Done with cardiology. She states that she would like consult to come from [facility].    [GM]  1742 Case discussed with [facility], Dr. Vista Lawman, who will admit patient. We discussed the importance of cardiology's involvement in this case. We are in agreement that cardiology will need to be re-consulted  as his primary issues appear to be cardiac in nature.   [GM]    Clinical Course User Index [GM] Romie Jumper, PA-C    Procedures       Junita Push 01/20/18 1756    Noemi Chapel, MD 01/21/18 [id number]

## 2018-01-20 NOTE — ED Provider Notes (Signed)
[facility] EMERGENCY DEPARTMENT Provider Note   CSN: [id number] Arrival date & time: 01/20/18  1414     History   Chief Complaint Chief Complaint  Patient presents with  . Chest Pain  . Shortness of Breath    HPI ONTARIO PETTENGILL is a 84 y.o. male.  The history is provided by the patient and medical records. No language interpreter was used.  Chest Pain   Associated symptoms include shortness of breath.  Shortness of Breath  Associated symptoms include chest pain.   MARQUIS DOWN is a 84 y.o. male  with a PMH of HTN, HLD, prior MI, A. fib on Coumadin who presents to the Emergency Department complaining of chest pain which he noticed upon awakening around 3 AM this morning to use the restroom.  Patient states that the pain has been constant since that time, occurring every time he takes a breath.  He took an aspirin and nitro with no improvement.  He denies any history of previous pain.  He is on Coumadin for his atrial fibrillation and denies any missed doses.  His last INR was checked 5 days ago where it was 2.9.  Associated with shortness of breath. EMS reports oxygen of mid 80s and placed him on 4 L prior to arrival.  He denies fever or chills.  No cough.  No abdominal pain, back pain, nausea, vomiting or diaphoresis.  Past Medical History:  Diagnosis Date  . Abscess 7/13   abd abscess  . Cancer [facility])    colon cancer  . Chronic atrial fibrillation ([place])   . History of colon cancer 2000   T3N0 s/p colectomy  . History of echocardiogram    a. Echo 2/13:  Mild LVH, EF 55-60%, mild MR, moderate LAE, mild to moderate RAE;  b. Echo 1/17: mild LVH, vigorous LVF, EF 65-70%, no RWMA, trivial AI, trivial MR, severe LAE, mild RAE, mod TR, PASP 32 mmHg  . History of stress test    a. Cardiolite in 9/04: EF 67%, no ischemia.;  b. Myoview 6/16: no ischemia   . Hx of adenomatous colonic polyps   . Hyperlipidemia   . Hypertension   . METHICILLIN RESISTANT  STAPHYLOCOCCUS AUREUS INFECTION 12/12/2006   Annotation: stitch abcess in lower abdomen after colon cancer resection Qualifier: Diagnosis of  By: [name] MD, [name]    . Myocardial infarction ([place]) 1985  . Postoperative stitch abscess 07/19/2011  . PVC's (premature ventricular contractions)   . Syncope and collapse    pt denies  . Thyroid disease    followed by [name]    Patient Active Problem List   Diagnosis Date Noted  . Hearing impairment 11/11/2015  . Olecranon bursitis of left elbow 11/11/2015  . LBBB (left bundle branch block) 05/27/2015  . Senile purpura ([place]) 02/22/2015  . Wound, open, arm, forearm 02/22/2015  . Advanced care planning/counseling discussion 11/06/2014  . Medicare annual wellness visit, initial 11/06/2014  . Bilateral knee pain 03/17/2014  . DOE (dyspnea on exertion) 02/16/2014  . Encounter for therapeutic drug monitoring 06/17/2013  . Memory impairment 03/06/2013  . MRSA colonization 12/12/2012  . Nevus of sternal region 12/04/2012  . Seasonal allergic rhinitis 10/22/2012  . Thyroid disease   . Suprapubic abscess 08/21/2012  . H/O colon cancer, stage I 06/24/2012  . Adenomatous colon polyp 06/24/2012  . Open wound-left arm and suprapubic area. 03/11/2012  . Chest discomfort 04/21/2011  . Hyperlipidemia 10/14/2010  . Atrial fibrillation ([place]) 08/08/2010  .  INSOMNIA, MILD 12/12/2006    Past Surgical History:  Procedure Laterality Date  . COLECTOMY  2000   6 inches removed  . COLONOSCOPY  2009   polyp, h/o cancer [name])  . EYE SURGERY     cataract surgery both eyes- pts ates he has never had cataract surgery 10-26-15  . HERNIA REPAIR  11/1243   DB umbilical hernia  . INCISE AND DRAIN ABCESS  2009,2010,2011,2012  . INCISION AND DRAINAGE Right 04/02/2013   Procedure: INCISION AND DRAINAGE RIGHT KNEE HEMATOMA;  Surgeon: [name], MD;  Location: [facility];  Service: Orthopedics;  Laterality: Right;  . INCISION AND DRAINAGE ABSCESS N/A  09/20/2012   Procedure: INCISION AND DRAINAGE SUPRAPUBIC ABSCESS;  Surgeon: [name], MD;  Location: [facility];  Service: General;  Laterality: N/A;  . INCISION AND DRAINAGE ABSCESS Left 02/28/2013   Procedure: INCISION AND DRAINAGE LEFT ARM ABSCESS;  Surgeon: [name], MD;  Location: [place];  Service: General;  Laterality: Left;  . LUMBAR LAMINECTOMY  1977  . MASS EXCISION  03/07/2012   EXCISION MASS;  Surgeon: [name], MD;  Laterality: Right;  removal mass posterior right arm  . POLYPECTOMY    . WOUND EXPLORATION  2006   For possible stitch abscess Dr [name]        Home Medications    Prior to Admission medications   Medication Sig Start Date End Date Taking? Authorizing Provider  Artificial Tear Ointment (DRY EYES OP) Apply 1 drop to eye as needed (dry eyes).   Yes [provider]  aspirin 81 MG tablet Take 1 tablet (81 mg total) by mouth daily. 10/03/16  Yes Nahser, Wonda Cheng, MD  donepezil (ARICEPT) 10 MG tablet Take 10 mg by mouth at bedtime.   Yes [provider]  fish oil-omega-3 fatty acids 1000 MG capsule Take 1 g by mouth daily.    Yes [provider]  folic acid (FOLVITE) 1 MG tablet Take 1 mg by mouth daily.   Yes [provider]  metoprolol tartrate (LOPRESSOR) 25 MG tablet TAKE 1 TABLET BY MOUTH TWICE DAILY Patient taking differently: Take 25 mg by mouth 2 (two) times daily.  01/08/17  Yes Weaver, Scott T, PA-C  nitroGLYCERIN (NITROSTAT) 0.4 MG SL tablet Place 1 tablet (0.4 mg total) under the tongue every 5 (five) minutes as needed for chest pain. 01/14/16  Yes Weaver, Scott T, PA-C  simvastatin (ZOCOR) 40 MG tablet Take 1 tablet (40 mg total) by mouth every morning. Patient needs to call and schedule an appointment for further refills 1st attempt 11/27/17  Yes Belva Crome, MD  Thiamine HCl (VITAMIN B-1 PO) Take 1 tablet by mouth daily.    Yes [provider]  warfarin (COUMADIN) 5 MG tablet  USE AS DIRECTED BY COUMADIN CLINIC Patient taking differently: Take 2.5 mg by mouth See admin instructions. Take 5mg  Tuesday-Sunday and 2.5mg  every monday 10/01/17  Yes Nahser, Wonda Cheng, MD  zolpidem (AMBIEN) 10 MG tablet Take 1 tablet (10 mg total) by mouth at bedtime as needed for sleep. 09/20/15  Yes Ria Bush, MD  mupirocin cream (BACTROBAN) 2 % Apply 1 application topically 2 (two) times daily. Patient not taking: Reported on 01/20/2018 08/05/15   Jearld Fenton, NP  tizanidine (ZANAFLEX) 2 MG capsule Take 1 capsule (2 mg total) by mouth at bedtime as needed for muscle spasms. Patient not taking: Reported on 01/20/2018 02/22/15   Ria Bush, MD  Family History Family History  Problem Relation Age of Onset  . Stroke Mother   . Stroke Father   . Hypertension Father   . CAD Father 75       MI  . Dementia Father   . Stroke Sister 27  . Stroke Brother   . Stroke Brother   . Diabetes Brother   . CAD Brother 42       MI  . Dementia Sister 105  . Dementia Brother   . Colon cancer Cousin        x4 total with colon cancer  . Cancer Neg Hx   . Colon polyps Neg Hx   . Rectal cancer Neg Hx   . Stomach cancer Neg Hx     Social History Social History   Tobacco Use  . Smoking status: Former Smoker    Last attempt to quit: 10/13/1977    Years since quitting: 40.2  . Smokeless tobacco: Former Systems developer    Types: [place] date: 10/13/1977  Substance Use Topics  . Alcohol use: No    Alcohol/week: 0.0 standard drinks  . Drug use: No     Allergies   Patient has no known allergies.   Review of Systems Review of Systems  Respiratory: Positive for shortness of breath.   Cardiovascular: Positive for chest pain.  All other systems reviewed and are negative.    Physical Exam Updated Vital Signs BP 120/67   Pulse 69   Temp 98.3 F (36.8 C) (Oral)   Resp (!) 31   Ht 5\' 9"  (1.753 m)   Wt 83.9 kg   SpO2 99%   BMI 27.32 kg/m   Physical Exam  Constitutional: He is  oriented to person, place, and time. He appears well-developed and well-nourished. No distress.  HENT:  Head: Normocephalic and atraumatic.  Cardiovascular: Normal rate, regular rhythm and normal heart sounds.  No murmur heard. Pulmonary/Chest: No respiratory distress.  Tachypneic, diminished breath sounds. Tenderness to left upper chest wall with overlying area of erythema and ecchymosis - patient denies injury or knowledge of skin changes.   Abdominal: Soft. He exhibits no distension. There is no tenderness.  Musculoskeletal: He exhibits no edema.  Neurological: He is alert and oriented to person, place, and time.  Skin: Skin is warm and dry.  Nursing note and vitals reviewed.    ED Treatments / Results  Labs (all labs ordered are listed, but only abnormal results are displayed) Labs Reviewed  CBC WITH DIFFERENTIAL/PLATELET - Abnormal; Notable for the following components:      Result Value   WBC 14.0 (*)    Neutro Abs 10.8 (*)    Monocytes Absolute 1.4 (*)    All other components within normal limits  BASIC METABOLIC PANEL - Abnormal; Notable for the following components:   Glucose, Bld 149 (*)    All other components within normal limits  I-STAT TROPONIN, ED - Abnormal; Notable for the following components:   Troponin i, poc 0.09 (*)    All other components within normal limits  BRAIN NATRIURETIC PEPTIDE  PROTIME-INR    EKG EKG Interpretation  Date/Time:  Sunday January 20 2018 14:19:57 EDT Ventricular Rate:  76 PR Interval:    QRS Duration: 151 QT Interval:  430 QTC Calculation: 484 R Axis:   1 Text Interpretation:  Atrial fibrillation Left bundle branch block Abnormal ekg Confirmed by Carmin Muskrat [id number]) on 01/20/2018 2:26:22 PM   Radiology Dg Chest 2 View  Result  Date: 01/20/2018 CLINICAL DATA:  Left chest pressure and shortness of breath EXAM: CHEST - 2 VIEW COMPARISON:  04/03/2016 FINDINGS: Mild enlargement of the cardiopericardial silhouette. Tortuous  thoracic aorta. Atherosclerotic calcification of the aortic arch. Low lung volumes are present, causing crowding of the pulmonary vasculature. Bibasilar linear opacities favoring mild atelectasis. Hazy density along the lower lobes primarily on the lateral projection is partially due to crowding of pulmonary vasculature but a component of ground-glass density or interstitial accentuation is difficult to exclude based on the lateral projection, and is primarily retro diaphragmatic. IMPRESSION: 1. Indistinct lower lobe opacities best seen on the lateral projection, potentially from alveolitis, low-grade edema, or atypical infectious process. There is also some mild bibasilar atelectasis. 2. Mild enlargement of the cardiopericardial silhouette. 3.  Aortic Atherosclerosis (ICD10-I70.0). 4. Low lung volumes are present, causing crowding of the pulmonary vasculature. Electronically Signed   By: Van Clines M.D.   On: 01/20/2018 15:01    Procedures Procedures (including critical care time)  Medications Ordered in ED Medications  iopamidol (ISOVUE-370) 76 % injection (has no administration in time range)  iopamidol (ISOVUE-370) 76 % injection 100 mL (100 mLs Intravenous Contrast Given 01/20/18 1550)     Initial Impression / Assessment and Plan / ED Course  I have reviewed the triage vital signs and the nursing notes.  Pertinent labs & imaging results that were available during my care of the patient were reviewed by me and considered in my medical decision making (see chart for details).  Clinical Course as of Jan 20 1602  Nancy Fetter Jan 20, 2018  1558 Case discussed with Amie Portland, PA-C. Needing admission for chest pain rule out vs PNA/PE. Received ASA and nitro without relief.   [GM]    Clinical Course User Index [GM] Mortis, Jonelle Sports, PA-C   PAULETTE LYNCH is a 84 y.o. male who presents to ED for constant chest pain which began this morning at 3 AM.  Pain occurs with deep breathing.  Per EMS,  patient was hypoxic with oxygenation in the 80s upon their arrival.  He was placed on 4 L of O2.  I have weaned him down to 1.5 L and he is maintaining oxygenation well.  He is afebrile with normal blood pressure and heart rate.  He is tachypneic on exam with diminished breath sounds.  Chest x-ray shows indistinct lower lobe opacities: edema versus atypical infectious etiology.  Troponin of 0.09.  EKG reviewed with attending, Dr. Vanita Panda, with no acute elevation or depression.  BNP elevated at 299 (no prior values for comparison).  Shunt is on Coumadin with last INR of 2.9 on 9/03.  Given abnormal chest x-ray, reported hypoxia and history of pleuritic chest pain, will proceed with CT Angio for further evaluation of his chest pain.  Imaging pending at shift change.  Care assumed by oncoming provider PA Mortis.  Case discussed, plan agreed upon.  Will follow up on pending imaging, anticipate admission.  Patient discussed with Dr. Vanita Panda who agrees with treatment plan.    Final Clinical Impressions(s) / ED Diagnoses   Final diagnoses:  Chest pain    ED Discharge Orders    None       Armari Fussell, Ozella Almond, PA-C 01/20/18 1613    Carmin Muskrat, MD 01/30/18 [phone]

## 2018-01-20 NOTE — ED Triage Notes (Signed)
Pt to ER bib [facility] from home with complaint of acute onset left chest pressure this morning with sob. Pt reports taking 1 nitro and 325 mg aspirin at home. Pt reports on arrival pain is 5/10. Hx of a fib, on coumadin.

## 2018-01-20 NOTE — Consult Note (Signed)
Cardiology Consultation   Patient ID: Christian Maxwell; [id number]; 13-Jan-1933   Admit date: 01/20/2018 Date of Consult: 01/20/2018  Referring Provider:  Benito Mccreedy  Primary Care Provider: Ria Bush, MD Cardiologist: [place]  Reason for Consultation: CP  History of Present Illness: Christian Maxwell is a 84 y.o. male w/ h/o chronic afib, dyslipidemia, h/o PVCs, LBBB who is being seen today for the evaluation of CP and abn troponin at the request of the hospitalist service. Cardiology notes indicate that pt has had CP on and off "for years"; most recent ischemia evaluation was nuc stress test in late 2017 that was negative for ischemia. Pt does not have documented h/o CAD that I can tell. Initial trop in ED was 0.09; EKG shows afib w/ HR 76, LBBB, no sig change from prior. Pt says he has been having CP for the past 2 days or so; it is a midsternal "soreness", that has a random onset and lasts a few minutes before resolving. It does not seem to get any better w/ any intervention; it does get worse w/ deep breathing, which is what prompted pt to present to the ED. It is the same as prior CP he has had in the past. There is no associated nausea, diaphoresis, or other significant associated sx.  Past Medical History:  Diagnosis Date  . Abscess 7/13   abd abscess  . Cancer [facility])    colon cancer  . Chronic atrial fibrillation ([place])   . History of colon cancer 2000   T3N0 s/p colectomy  . History of echocardiogram    a. Echo 2/13:  Mild LVH, EF 55-60%, mild MR, moderate LAE, mild to moderate RAE;  b. Echo 1/17: mild LVH, vigorous LVF, EF 65-70%, no RWMA, trivial AI, trivial MR, severe LAE, mild RAE, mod TR, PASP 32 mmHg  . History of stress test    a. Cardiolite in 9/04: EF 67%, no ischemia.;  b. Myoview 6/16: no ischemia   . Hx of adenomatous colonic polyps   . Hyperlipidemia   . Hypertension   . METHICILLIN RESISTANT STAPHYLOCOCCUS AUREUS INFECTION 12/12/2006   Annotation:  stitch abcess in lower abdomen after colon cancer resection Qualifier: Diagnosis of  By: [name] MD, [name]    . Myocardial infarction ([place]) 1985  . Postoperative stitch abscess 07/19/2011  . PVC's (premature ventricular contractions)   . Syncope and collapse    pt denies  . Thyroid disease    followed by [name]    Past Surgical History:  Procedure Laterality Date  . COLECTOMY  2000   6 inches removed  . COLONOSCOPY  2009   polyp, h/o cancer [name])  . EYE SURGERY     cataract surgery both eyes- pts ates he has never had cataract surgery 10-26-15  . HERNIA REPAIR  07/3543   DB umbilical hernia  . INCISE AND DRAIN ABCESS  2009,2010,2011,2012  . INCISION AND DRAINAGE Right 04/02/2013   Procedure: INCISION AND DRAINAGE RIGHT KNEE HEMATOMA;  Surgeon: [name], MD;  Location: [place];  Service: Orthopedics;  Laterality: Right;  . INCISION AND DRAINAGE ABSCESS N/A 09/20/2012   Procedure: INCISION AND DRAINAGE SUPRAPUBIC ABSCESS;  Surgeon: [name], MD;  Location: [place];  Service: General;  Laterality: N/A;  . INCISION AND DRAINAGE ABSCESS Left 02/28/2013   Procedure: INCISION AND DRAINAGE LEFT ARM ABSCESS;  Surgeon: [name], MD;  Location: [place];  Service: General;  Laterality: Left;  . LUMBAR LAMINECTOMY  1977  .  MASS EXCISION  03/07/2012   EXCISION MASS;  Surgeon: [name], MD;  Laterality: Right;  removal mass posterior right arm  . POLYPECTOMY    . WOUND EXPLORATION  2006   For possible stitch abscess Dr [name]      Current Medications: . aspirin  81 mg Oral Daily  . donepezil  10 mg Oral QHS  . folic acid  1 mg Oral Daily  . furosemide  20 mg Intravenous Q12H  . metoprolol tartrate  25 mg Oral BID  . omega-3 acid ethyl esters  1 g Oral Daily  . [START ON 01/21/2018] simvastatin  40 mg Oral q morning - 10a  . thiamine  100 mg Oral Daily  . [START ON 01/21/2018] Warfarin - Pharmacist Dosing Inpatient   Does not apply q1800     Infused Medications:  No current facility-administered medications on file prior to encounter.    Current Outpatient Medications on File Prior to Encounter  Medication Sig Dispense Refill  . Artificial Tear Ointment (DRY EYES OP) Apply 1 drop to eye as needed (dry eyes).    [name] aspirin 81 MG tablet Take 1 tablet (81 mg total) by mouth daily. 30 tablet   . donepezil (ARICEPT) 10 MG tablet Take 10 mg by mouth at bedtime.    . fish oil-omega-3 fatty acids 1000 MG capsule Take 1 g by mouth daily.     . folic acid (FOLVITE) 1 MG tablet Take 1 mg by mouth daily.    . metoprolol tartrate (LOPRESSOR) 25 MG tablet TAKE 1 TABLET BY MOUTH TWICE DAILY (Patient taking differently: Take 25 mg by mouth 2 (two) times daily. ) 60 tablet 8  . nitroGLYCERIN (NITROSTAT) 0.4 MG SL tablet Place 1 tablet (0.4 mg total) under the tongue every 5 (five) minutes as needed for chest pain. 25 tablet 6  . simvastatin (ZOCOR) 40 MG tablet Take 1 tablet (40 mg total) by mouth every morning. Patient needs to call and schedule an appointment for further refills 1st attempt 30 tablet 0  . Thiamine HCl (VITAMIN B-1 PO) Take 1 tablet by mouth daily.     [name] warfarin (COUMADIN) 5 MG tablet USE AS DIRECTED BY COUMADIN CLINIC (Patient taking differently: Take 2.5 mg by mouth See admin instructions. Take 5mg  Tuesday-Sunday and 2.5mg  every monday) 30 tablet 3  . zolpidem (AMBIEN) 10 MG tablet Take 1 tablet (10 mg total) by mouth at bedtime as needed for sleep.    . mupirocin cream (BACTROBAN) 2 % Apply 1 application topically 2 (two) times daily. (Patient not taking: Reported on 01/20/2018) 30 g 1  . tizanidine (ZANAFLEX) 2 MG capsule Take 1 capsule (2 mg total) by mouth at bedtime as needed for muscle spasms. (Patient not taking: Reported on 01/20/2018) 30 capsule 2      PRN Medications: acetaminophen, ALPRAZolam, nitroGLYCERIN, ondansetron (ZOFRAN) IV, zolpidem   Allergies:   No Known Allergies  Social History:   The patient   reports that he quit smoking about 40 years ago. He quit smokeless tobacco use about 40 years ago.  His smokeless tobacco use included chew. He reports that he does not drink alcohol or use drugs.    Family History:   The patient's family history includes CAD (age of onset: 27) in his brother; CAD (age of onset: 41) in his father; Colon cancer in his cousin; Dementia in his brother and father; Dementia (age of onset: 46) in his sister; Diabetes in his brother; Hypertension in his  father; Stroke in his brother, brother, father, and mother; Stroke (age of onset: 53) in his sister.   ROS:  Please see the history of present illness.  All other ROS reviewed and negative.     Vital Signs: Blood pressure 130/78, pulse 75, temperature 98.3 F (36.8 C), temperature source Oral, resp. rate (!) 22, height 5\' 8"  (1.727 m), weight 79 kg, SpO2 96 %.   PHYSICAL EXAM: General:  Well nourished, well developed, in no acute distress HEENT: normal Lymph: no adenopathy Neck: no JVD Endocrine:  No thryomegaly Vascular: No carotid bruits; DP pulses 2+ bilaterally  Cardiac:  normal S1, S2; irreg irreg; no murmur  Lungs:  clear to auscultation bilaterally, no wheezing, rhonchi or rales  Abd: soft, nontender, no hepatomegaly  Ext: no edema Musculoskeletal:  No deformities, BUE and BLE strength normal and equal Skin: warm and dry  Neuro:  CNs 2-12 intact, no focal abnormalities noted Psych:  Normal affect   EKG:  afib w/ HR 76, LBBB  Labs: No results for input(s): CKTOTAL, CKMB, TROPONINI in the last 72 hours. Recent Labs    01/20/18 1453  TROPIPOC 0.09*    Lab Results  Component Value Date   WBC 14.0 (H) 01/20/2018   HGB 15.5 01/20/2018   HCT 48.7 01/20/2018   MCV 88.5 01/20/2018   PLT 173 01/20/2018   Recent Labs  Lab 01/20/18 1445  NA 137  K 3.7  CL 103  CO2 23  BUN 18  CREATININE 0.93  CALCIUM 9.7  GLUCOSE 149*   Lab Results  Component Value Date   CHOL 147 10/04/2016   HDL 41  10/04/2016   LDLCALC 71 10/04/2016   TRIG 174 (H) 10/04/2016   No results found for: DDIMER  Radiology/Studies:  Dg Chest 2 View  Result Date: 01/20/2018 CLINICAL DATA:  Left chest pressure and shortness of breath EXAM: CHEST - 2 VIEW COMPARISON:  04/03/2016 FINDINGS: Mild enlargement of the cardiopericardial silhouette. Tortuous thoracic aorta. Atherosclerotic calcification of the aortic arch. Low lung volumes are present, causing crowding of the pulmonary vasculature. Bibasilar linear opacities favoring mild atelectasis. Hazy density along the lower lobes primarily on the lateral projection is partially due to crowding of pulmonary vasculature but a component of ground-glass density or interstitial accentuation is difficult to exclude based on the lateral projection, and is primarily retro diaphragmatic. IMPRESSION: 1. Indistinct lower lobe opacities best seen on the lateral projection, potentially from alveolitis, low-grade edema, or atypical infectious process. There is also some mild bibasilar atelectasis. 2. Mild enlargement of the cardiopericardial silhouette. 3.  Aortic Atherosclerosis (ICD10-I70.0). 4. Low lung volumes are present, causing crowding of the pulmonary vasculature. Electronically Signed   By: Van Clines M.D.   On: 01/20/2018 15:01   Ct Angio Chest Pe W And/or Wo Contrast  Result Date: 01/20/2018 CLINICAL DATA:  Acute onset chest pressure this morning with shortness of breath. Taking Coumadin for atrial fibrillation. EXAM: CT ANGIOGRAPHY CHEST WITH CONTRAST TECHNIQUE: Multidetector CT imaging of the chest was performed using the standard protocol during bolus administration of intravenous contrast. Multiplanar CT image reconstructions and MIPs were obtained to evaluate the vascular anatomy. CONTRAST:  112mL ISOVUE-370 IOPAMIDOL (ISOVUE-370) INJECTION 76% COMPARISON:  Chest radiographs obtained today. Chest CT dated 08/24/2004. Chest radiographs dated 04/03/2016. FINDINGS:  Cardiovascular: Mild atheromatous arterial calcifications, including coronary artery and aortic calcifications. Enlarged heart. Normally opacified pulmonary arteries with no pulmonary arterial filling defects seen. Mediastinum/Nodes: No enlarged mediastinal, hilar, or axillary lymph nodes. Thyroid gland,  trachea, and esophagus demonstrate no significant findings. Lungs/Pleura: Mild bilateral dependent atelectasis, corresponding to the densities seen on the radiographs earlier today. Upper Abdomen: Partially included gallbladder on the last images, filled with multiple small stones, measuring up to 3 mm in diameter each. No visible gallbladder wall thickening or pericholecystic fluid. Musculoskeletal: Old, healed right posterior rib fractures. No acute fracture seen. Approximately 10% T1 vertebral superior endplate compression deformity with minimal bony retropulsion, sclerosis and no acute fracture lines. Approximately 40% T4 vertebral compression deformity with no bony retropulsion and no acute fracture lines. The T4 vertebral body compression deformity is unchanged since 04/03/2016. The T1 level was obscured by the shoulders at that time. Review of the MIP images confirms the above findings. IMPRESSION: 1. No pulmonary emboli. 2. Mild bilateral dependent atelectasis. 3. Cholelithiasis. 4. Old T1 and T4 vertebral compression fractures. 5. Mild calcific coronary artery and aortic atherosclerosis. Aortic Atherosclerosis (ICD10-I70.0). Electronically Signed   By: Claudie Revering M.D.   On: 01/20/2018 16:24   TTE 06-14-15 Left ventricle: The cavity size was normal. Wall thickness was   increased in a pattern of mild LVH. Systolic function was   vigorous. The estimated ejection fraction was in the range of 65%   to 70%. Wall motion was normal; there were no regional wall   motion abnormalities. The study is not technically sufficient to   allow evaluation of LV diastolic function. - Aortic valve: Trileaflet.  Transvalvular velocity was within the   normal range. There was no stenosis. There was trivial   regurgitation. - Mitral valve: Mildly thickened leaflets . There was trivial   regurgitation. - Left atrium: Severely dilated at 65 ml/m2. - Right atrium: The atrium was mildly dilated. - Atrial septum: No defect or patent foramen ovale was identified. - Tricuspid valve: There was moderate regurgitation. - Pulmonary arteries: PA peak pressure: 32 mm Hg (S). - Inferior vena cava: The vessel was normal in size. The   respirophasic diameter changes were in the normal range (>= 50%),   consistent with normal central venous pressure.  Impressions:  - Compared to a prior study in 2013, the EF is higher at 65-70%.   There is now severe LAE, moderate TR, top normal RVSP at 32 mmHg. ASSESSMENT AND PLAN:  1. CP: initial trop 0.09. Will cycle through the night. If trend is up/abn, consider LHC for definitive evaluation. Will keep NPO in case a procedure is ordered for tomorrow. TTE has been ordered by hospitalist svc  2. Afib: chronic, rate-controlled. Cont current home regimen. He is on coumadin for CVA prophylaxis at home; recommend continue that for now  3. HTN/dyslipidemia/thyroid d/o: cont home regimen and adjust as needed  Thank you for the opportunity to participate in the care of this patient. Will follow. Please call w/ questions.   Signed, Rudean Curt, MD, [facility] 01/20/2018 7:51 PM

## 2018-01-20 NOTE — H&P (Signed)
History and Physical    RANGER PETRICH VCB:[id number] DOB: 1932-09-28 DOA: 01/20/2018  Referring MD/NP/PA:   PCP: Ria Bush, MD  Outpatient Specialists:  Patient coming from: home  Chief Complaint: chest pain  HPI: Christian Maxwell is a 84 y.o. male with medical history significant of for but not limited to chronic A. Fib on coagulation and CAD presented with chest pain.  He noted the chest pain upon awakening around 3 AM this morning to use the restroom.  Patient states that the pain has been constant since that time, occurring every time he takes a breath.  He took an aspirin and nitro with no improvement.  He denies any history of previous pain.  He is on Coumadin for his atrial fibrillation and denies any missed doses.  His last INR was checked 5 days ago where it was 2.9.  Associated with shortness of breath. EMS reports oxygen of mid 80s and placed him on 4 L prior to arrival.  He denies fever or chills.  No cough.  No abdominal pain, back pain, nausea, vomiting or diaphoresis.    ED Course: patient hemodynamically stable, but was noted to be hypoxic with O2 sats patient 80s,troponin was marginally elevated at 0.09, his x-ray was notable for questionable edema versus infection, follow-up CT angiogram was negative for PE nor was any airspace disease or edema noted on CT. Cardiologist a timeout was consulted by ED physician requested hospitalist admission and for them to consult.  Review of Systems: As per HPI otherwise 10 point review of systems negative.    Past Medical History:  Diagnosis Date  . Abscess 7/13   abd abscess  . Cancer [facility])    colon cancer  . Chronic atrial fibrillation ([place])   . History of colon cancer 2000   T3N0 s/p colectomy  . History of echocardiogram    a. Echo 2/13:  Mild LVH, EF 55-60%, mild MR, moderate LAE, mild to moderate RAE;  b. Echo 1/17: mild LVH, vigorous LVF, EF 65-70%, no RWMA, trivial AI, trivial MR, severe LAE, mild RAE, mod TR,  PASP 32 mmHg  . History of stress test    a. Cardiolite in 9/04: EF 67%, no ischemia.;  b. Myoview 6/16: no ischemia   . Hx of adenomatous colonic polyps   . Hyperlipidemia   . Hypertension   . METHICILLIN RESISTANT STAPHYLOCOCCUS AUREUS INFECTION 12/12/2006   Annotation: stitch abcess in lower abdomen after colon cancer resection Qualifier: Diagnosis of  By: [name] MD, [name]    . Myocardial infarction ([place]) 1985  . Postoperative stitch abscess 07/19/2011  . PVC's (premature ventricular contractions)   . Syncope and collapse    pt denies  . Thyroid disease    followed by [name]    Past Surgical History:  Procedure Laterality Date  . COLECTOMY  2000   6 inches removed  . COLONOSCOPY  2009   polyp, h/o cancer [name])  . EYE SURGERY     cataract surgery both eyes- pts ates he has never had cataract surgery 10-26-15  . HERNIA REPAIR  07/8464   DB umbilical hernia  . INCISE AND DRAIN ABCESS  2009,2010,2011,2012  . INCISION AND DRAINAGE Right 04/02/2013   Procedure: INCISION AND DRAINAGE RIGHT KNEE HEMATOMA;  Surgeon: [name], MD;  Location: [place];  Service: Orthopedics;  Laterality: Right;  . INCISION AND DRAINAGE ABSCESS N/A 09/20/2012   Procedure: INCISION AND DRAINAGE SUPRAPUBIC ABSCESS;  Surgeon: [name], MD;  Location:  [place];  Service: General;  Laterality: N/A;  . INCISION AND DRAINAGE ABSCESS Left 02/28/2013   Procedure: INCISION AND DRAINAGE LEFT ARM ABSCESS;  Surgeon: [name], MD;  Location: [place];  Service: General;  Laterality: Left;  . LUMBAR LAMINECTOMY  1977  . MASS EXCISION  03/07/2012   EXCISION MASS;  Surgeon: [name], MD;  Laterality: Right;  removal mass posterior right arm  . POLYPECTOMY    . WOUND EXPLORATION  2006   For possible stitch abscess Dr [name]     reports that he quit smoking about 40 years ago. He quit smokeless tobacco use about 40 years ago.  His smokeless tobacco use included chew.  He reports that he does not drink alcohol or use drugs.  No Known Allergies  Family History  Problem Relation Age of Onset  . Stroke Mother   . Stroke Father   . Hypertension Father   . CAD Father 64       MI  . Dementia Father   . Stroke Sister 76  . Stroke Brother   . Stroke Brother   . Diabetes Brother   . CAD Brother 46       MI  . Dementia Sister 66  . Dementia Brother   . Colon cancer Cousin        x4 total with colon cancer  . Cancer Neg Hx   . Colon polyps Neg Hx   . Rectal cancer Neg Hx   . Stomach cancer Neg Hx      Prior to Admission medications   Medication Sig Start Date End Date Taking? Authorizing Provider  Artificial Tear Ointment (DRY EYES OP) Apply 1 drop to eye as needed (dry eyes).   Yes [provider]  aspirin 81 MG tablet Take 1 tablet (81 mg total) by mouth daily. 10/03/16  Yes Nahser, Wonda Cheng, MD  donepezil (ARICEPT) 10 MG tablet Take 10 mg by mouth at bedtime.   Yes [provider]  fish oil-omega-3 fatty acids 1000 MG capsule Take 1 g by mouth daily.    Yes [provider]  folic acid (FOLVITE) 1 MG tablet Take 1 mg by mouth daily.   Yes [provider]  metoprolol tartrate (LOPRESSOR) 25 MG tablet TAKE 1 TABLET BY MOUTH TWICE DAILY Patient taking differently: Take 25 mg by mouth 2 (two) times daily.  01/08/17  Yes Weaver, Scott T, PA-C  nitroGLYCERIN (NITROSTAT) 0.4 MG SL tablet Place 1 tablet (0.4 mg total) under the tongue every 5 (five) minutes as needed for chest pain. 01/14/16  Yes Weaver, Scott T, PA-C  simvastatin (ZOCOR) 40 MG tablet Take 1 tablet (40 mg total) by mouth every morning. Patient needs to call and schedule an appointment for further refills 1st attempt 11/27/17  Yes Belva Crome, MD  Thiamine HCl (VITAMIN B-1 PO) Take 1 tablet by mouth daily.    Yes [provider]  warfarin (COUMADIN) 5 MG tablet USE AS DIRECTED BY COUMADIN CLINIC Patient taking differently: Take 2.5 mg by mouth See  admin instructions. Take 5mg  Tuesday-Sunday and 2.5mg  every monday 10/01/17  Yes Nahser, Wonda Cheng, MD  zolpidem (AMBIEN) 10 MG tablet Take 1 tablet (10 mg total) by mouth at bedtime as needed for sleep. 09/20/15  Yes Ria Bush, MD  mupirocin cream (BACTROBAN) 2 % Apply 1 application topically 2 (two) times daily. Patient not taking: Reported on 01/20/2018 08/05/15   Jearld Fenton, NP  tizanidine (ZANAFLEX)  2 MG capsule Take 1 capsule (2 mg total) by mouth at bedtime as needed for muscle spasms. Patient not taking: Reported on 01/20/2018 02/22/15   Ria Bush, MD    Physical Exam: Vitals:   01/20/18 1426 01/20/18 1430 01/20/18 1445 01/20/18 1508  BP: 121/74 116/79 120/67   Pulse: 73 88 69   Resp: 20 (!) 23 (!) 31   Temp: 98.3 F (36.8 C)     TempSrc: Oral     SpO2: 100% 98% 99%   Weight:    83.9 kg  Height:    5\' 9"  (1.753 m)      Constitutional: NAD, calm, comfortable Vitals:   01/20/18 1426 01/20/18 1430 01/20/18 1445 01/20/18 1508  BP: 121/74 116/79 120/67   Pulse: 73 88 69   Resp: 20 (!) 23 (!) 31   Temp: 98.3 F (36.8 C)     TempSrc: Oral     SpO2: 100% 98% 99%   Weight:    83.9 kg  Height:    5\' 9"  (1.753 m)   Eyes: PERRL, lids and conjunctivae normal ENMT: Mucous membranes are moist. Posterior pharynx clear of any exudate or lesions.Normal dentition.  Neck: normal, supple, no masses, no thyromegaly Respiratory: mildly diminished BA at the bases otherwise clear to auscultation bilaterally, no wheezing Cardiovascular: Regular rate and rhythm, occ extrasystoles  Abdomen: no tenderness, no masses palpated. No hepatosplenomegaly. Bowel sounds positive.  Musculoskeletal: no clubbing / cyanosis. Neurologic: No acute focal deficit Psychiatric: Normal judgment and insight.  Labs on Admission: I have personally reviewed following labs and imaging studies  CBC: Recent Labs  Lab 01/20/18 1445  WBC 14.0*  NEUTROABS 10.8*  HGB 15.5  HCT 48.7  MCV 88.5  PLT  542   Basic Metabolic Panel: Recent Labs  Lab 01/20/18 1445  NA 137  K 3.7  CL 103  CO2 23  GLUCOSE 149*  BUN 18  CREATININE 0.93  CALCIUM 9.7   GFR: Estimated Creatinine Clearance: 60.2 mL/min (by C-G formula based on SCr of 0.93 mg/dL). Liver Function Tests: No results for input(s): AST, ALT, ALKPHOS, BILITOT, PROT, ALBUMIN in the last 168 hours. No results for input(s): LIPASE, AMYLASE in the last 168 hours. No results for input(s): AMMONIA in the last 168 hours. Coagulation Profile: Recent Labs  Lab 01/15/18 1013 01/20/18 1617  INR 2.9 2.16   Cardiac Enzymes: No results for input(s): CKTOTAL, CKMB, CKMBINDEX, TROPONINI in the last 168 hours. BNP (last 3 results) No results for input(s): PROBNP in the last 8760 hours. HbA1C: No results for input(s): HGBA1C in the last 72 hours. CBG: No results for input(s): GLUCAP in the last 168 hours. Lipid Profile: No results for input(s): CHOL, HDL, LDLCALC, TRIG, CHOLHDL, LDLDIRECT in the last 72 hours. Thyroid Function Tests: No results for input(s): TSH, T4TOTAL, FREET4, T3FREE, THYROIDAB in the last 72 hours. Anemia Panel: No results for input(s): VITAMINB12, FOLATE, FERRITIN, TIBC, IRON, RETICCTPCT in the last 72 hours. Urine analysis:    Component Value Date/Time   COLORURINE YELLOW 07/17/2012 0950   APPEARANCEUR CLEAR 07/17/2012 0950   LABSPEC 1.020 07/17/2012 0950   PHURINE 6.5 07/17/2012 0950   GLUCOSEU NEG 07/17/2012 0950   HGBUR NEG 07/17/2012 0950   BILIRUBINUR NEG 07/17/2012 0950   KETONESUR NEG 07/17/2012 0950   PROTEINUR NEG 07/17/2012 0950   UROBILINOGEN 0.2 07/17/2012 0950   NITRITE NEG 07/17/2012 0950   LEUKOCYTESUR NEG 07/17/2012 0950   Sepsis Labs: @LABRCNTIP (procalcitonin:4,lacticidven:4) )No results found for this or any  previous visit (from the past 240 hour(s)).   Radiological Exams on Admission: Dg Chest 2 View  Result Date: 01/20/2018 CLINICAL DATA:  Left chest pressure and shortness of  breath EXAM: CHEST - 2 VIEW COMPARISON:  04/03/2016 FINDINGS: Mild enlargement of the cardiopericardial silhouette. Tortuous thoracic aorta. Atherosclerotic calcification of the aortic arch. Low lung volumes are present, causing crowding of the pulmonary vasculature. Bibasilar linear opacities favoring mild atelectasis. Hazy density along the lower lobes primarily on the lateral projection is partially due to crowding of pulmonary vasculature but a component of ground-glass density or interstitial accentuation is difficult to exclude based on the lateral projection, and is primarily retro diaphragmatic. IMPRESSION: 1. Indistinct lower lobe opacities best seen on the lateral projection, potentially from alveolitis, low-grade edema, or atypical infectious process. There is also some mild bibasilar atelectasis. 2. Mild enlargement of the cardiopericardial silhouette. 3.  Aortic Atherosclerosis (ICD10-I70.0). 4. Low lung volumes are present, causing crowding of the pulmonary vasculature. Electronically Signed   By: Van Clines M.D.   On: 01/20/2018 15:01   Ct Angio Chest Pe W And/or Wo Contrast  Result Date: 01/20/2018 CLINICAL DATA:  Acute onset chest pressure this morning with shortness of breath. Taking Coumadin for atrial fibrillation. EXAM: CT ANGIOGRAPHY CHEST WITH CONTRAST TECHNIQUE: Multidetector CT imaging of the chest was performed using the standard protocol during bolus administration of intravenous contrast. Multiplanar CT image reconstructions and MIPs were obtained to evaluate the vascular anatomy. CONTRAST:  16mL ISOVUE-370 IOPAMIDOL (ISOVUE-370) INJECTION 76% COMPARISON:  Chest radiographs obtained today. Chest CT dated 08/24/2004. Chest radiographs dated 04/03/2016. FINDINGS: Cardiovascular: Mild atheromatous arterial calcifications, including coronary artery and aortic calcifications. Enlarged heart. Normally opacified pulmonary arteries with no pulmonary arterial filling defects seen.  Mediastinum/Nodes: No enlarged mediastinal, hilar, or axillary lymph nodes. Thyroid gland, trachea, and esophagus demonstrate no significant findings. Lungs/Pleura: Mild bilateral dependent atelectasis, corresponding to the densities seen on the radiographs earlier today. Upper Abdomen: Partially included gallbladder on the last images, filled with multiple small stones, measuring up to 3 mm in diameter each. No visible gallbladder wall thickening or pericholecystic fluid. Musculoskeletal: Old, healed right posterior rib fractures. No acute fracture seen. Approximately 10% T1 vertebral superior endplate compression deformity with minimal bony retropulsion, sclerosis and no acute fracture lines. Approximately 40% T4 vertebral compression deformity with no bony retropulsion and no acute fracture lines. The T4 vertebral body compression deformity is unchanged since 04/03/2016. The T1 level was obscured by the shoulders at that time. Review of the MIP images confirms the above findings. IMPRESSION: 1. No pulmonary emboli. 2. Mild bilateral dependent atelectasis. 3. Cholelithiasis. 4. Old T1 and T4 vertebral compression fractures. 5. Mild calcific coronary artery and aortic atherosclerosis. Aortic Atherosclerosis (ICD10-I70.0). Electronically Signed   By: Claudie Revering M.D.   On: 01/20/2018 16:24    EKG: Independently reviewed.  Assessment/Plan Active Problems:   Chest pain   1.Chest pain: Atypical - but concerning about hypoxic event to the ED which is not explained by pulmonary workup so far with chest x-ray and CTA scan, mildly elevated troponin of 0.09. Telemetry monitoring Serial cardiac enzymes 2-D echocardiogram Supportive care Cardiac consult   2. Atrial fibrillation: Rate controlled, continue Coumadin  3. Dyspnea/reported Hypoxia in ED: CXR -? [place].infection CTA neg PE, no air space disease No evidence of clinical bronchospastic disease Elevated BNP  Trial of low dose lasix Follow  clinically  DVT prophylaxis: Coumadin Code Status: full code Family Communication:  Disposition Plan: home Consults called: cardiology, Dr.  Hassell Done by EDP Admission status:  obs / tele   Benito Mccreedy MD [facility] Pager [phone]  If 7PM-7AM, please contact night-coverage [email or web address] Password [facility]  01/20/2018, 5:43 PM

## 2018-01-20 NOTE — ED Notes (Signed)
Patient transported to X-ray 

## 2018-01-21 ENCOUNTER — Encounter (HOSPITAL_COMMUNITY)
Admission: EM | Disposition: A | Discharge status: Rehabilitation Facility | Payer: Self-pay | Source: Home / Self Care | Attending: Internal Medicine

## 2018-01-21 ENCOUNTER — Observation Stay (HOSPITAL_COMMUNITY): Payer: Medicare Other

## 2018-01-21 ENCOUNTER — Inpatient Hospital Stay (HOSPITAL_COMMUNITY): Payer: Medicare Other

## 2018-01-21 DIAGNOSIS — T45515A Adverse effect of anticoagulants, initial encounter: Secondary | ICD-10-CM | POA: Diagnosis present

## 2018-01-21 DIAGNOSIS — Z823 Family history of stroke: Secondary | ICD-10-CM | POA: Diagnosis not present

## 2018-01-21 DIAGNOSIS — Z9049 Acquired absence of other specified parts of digestive tract: Secondary | ICD-10-CM | POA: Diagnosis not present

## 2018-01-21 DIAGNOSIS — I447 Left bundle-branch block, unspecified: Secondary | ICD-10-CM | POA: Diagnosis present

## 2018-01-21 DIAGNOSIS — R748 Abnormal levels of other serum enzymes: Secondary | ICD-10-CM | POA: Diagnosis not present

## 2018-01-21 DIAGNOSIS — Z8614 Personal history of Methicillin resistant Staphylococcus aureus infection: Secondary | ICD-10-CM | POA: Diagnosis not present

## 2018-01-21 DIAGNOSIS — R072 Precordial pain: Secondary | ICD-10-CM | POA: Diagnosis not present

## 2018-01-21 DIAGNOSIS — G301 Alzheimer's disease with late onset: Secondary | ICD-10-CM | POA: Diagnosis not present

## 2018-01-21 DIAGNOSIS — R0902 Hypoxemia: Secondary | ICD-10-CM | POA: Diagnosis present

## 2018-01-21 DIAGNOSIS — M19032 Primary osteoarthritis, left wrist: Secondary | ICD-10-CM | POA: Diagnosis not present

## 2018-01-21 DIAGNOSIS — I614 Nontraumatic intracerebral hemorrhage in cerebellum: Secondary | ICD-10-CM | POA: Diagnosis not present

## 2018-01-21 DIAGNOSIS — D72829 Elevated white blood cell count, unspecified: Secondary | ICD-10-CM | POA: Diagnosis present

## 2018-01-21 DIAGNOSIS — R269 Unspecified abnormalities of gait and mobility: Secondary | ICD-10-CM | POA: Diagnosis not present

## 2018-01-21 DIAGNOSIS — R071 Chest pain on breathing: Secondary | ICD-10-CM | POA: Diagnosis not present

## 2018-01-21 DIAGNOSIS — I618 Other nontraumatic intracerebral hemorrhage: Secondary | ICD-10-CM | POA: Diagnosis present

## 2018-01-21 DIAGNOSIS — Z7982 Long term (current) use of aspirin: Secondary | ICD-10-CM | POA: Diagnosis not present

## 2018-01-21 DIAGNOSIS — Z86010 Personal history of colonic polyps: Secondary | ICD-10-CM | POA: Diagnosis not present

## 2018-01-21 DIAGNOSIS — Z7901 Long term (current) use of anticoagulants: Secondary | ICD-10-CM

## 2018-01-21 DIAGNOSIS — J9811 Atelectasis: Secondary | ICD-10-CM | POA: Diagnosis present

## 2018-01-21 DIAGNOSIS — I252 Old myocardial infarction: Secondary | ICD-10-CM | POA: Diagnosis not present

## 2018-01-21 DIAGNOSIS — R918 Other nonspecific abnormal finding of lung field: Secondary | ICD-10-CM | POA: Diagnosis not present

## 2018-01-21 DIAGNOSIS — R0602 Shortness of breath: Secondary | ICD-10-CM | POA: Diagnosis not present

## 2018-01-21 DIAGNOSIS — E785 Hyperlipidemia, unspecified: Secondary | ICD-10-CM | POA: Diagnosis not present

## 2018-01-21 DIAGNOSIS — I69198 Other sequelae of nontraumatic intracerebral hemorrhage: Secondary | ICD-10-CM | POA: Diagnosis present

## 2018-01-21 DIAGNOSIS — I2510 Atherosclerotic heart disease of native coronary artery without angina pectoris: Secondary | ICD-10-CM | POA: Diagnosis present

## 2018-01-21 DIAGNOSIS — R2689 Other abnormalities of gait and mobility: Secondary | ICD-10-CM | POA: Diagnosis present

## 2018-01-21 DIAGNOSIS — R531 Weakness: Secondary | ICD-10-CM | POA: Diagnosis present

## 2018-01-21 DIAGNOSIS — Z8249 Family history of ischemic heart disease and other diseases of the circulatory system: Secondary | ICD-10-CM | POA: Diagnosis not present

## 2018-01-21 DIAGNOSIS — I619 Nontraumatic intracerebral hemorrhage, unspecified: Secondary | ICD-10-CM | POA: Diagnosis present

## 2018-01-21 DIAGNOSIS — Z87891 Personal history of nicotine dependence: Secondary | ICD-10-CM | POA: Diagnosis not present

## 2018-01-21 DIAGNOSIS — Z79899 Other long term (current) drug therapy: Secondary | ICD-10-CM | POA: Diagnosis not present

## 2018-01-21 DIAGNOSIS — Z833 Family history of diabetes mellitus: Secondary | ICD-10-CM | POA: Diagnosis not present

## 2018-01-21 DIAGNOSIS — F0280 Dementia in other diseases classified elsewhere without behavioral disturbance: Secondary | ICD-10-CM | POA: Diagnosis not present

## 2018-01-21 DIAGNOSIS — H9190 Unspecified hearing loss, unspecified ear: Secondary | ICD-10-CM | POA: Diagnosis present

## 2018-01-21 DIAGNOSIS — I6523 Occlusion and stenosis of bilateral carotid arteries: Secondary | ICD-10-CM | POA: Diagnosis present

## 2018-01-21 DIAGNOSIS — K5900 Constipation, unspecified: Secondary | ICD-10-CM | POA: Diagnosis present

## 2018-01-21 DIAGNOSIS — F0390 Unspecified dementia without behavioral disturbance: Secondary | ICD-10-CM | POA: Diagnosis present

## 2018-01-21 DIAGNOSIS — R7303 Prediabetes: Secondary | ICD-10-CM | POA: Diagnosis not present

## 2018-01-21 DIAGNOSIS — R3915 Urgency of urination: Secondary | ICD-10-CM | POA: Diagnosis present

## 2018-01-21 DIAGNOSIS — D689 Coagulation defect, unspecified: Secondary | ICD-10-CM | POA: Diagnosis present

## 2018-01-21 DIAGNOSIS — I639 Cerebral infarction, unspecified: Secondary | ICD-10-CM | POA: Diagnosis not present

## 2018-01-21 DIAGNOSIS — G319 Degenerative disease of nervous system, unspecified: Secondary | ICD-10-CM | POA: Diagnosis present

## 2018-01-21 DIAGNOSIS — Z800 Family history of malignant neoplasm of digestive organs: Secondary | ICD-10-CM | POA: Diagnosis not present

## 2018-01-21 DIAGNOSIS — I610 Nontraumatic intracerebral hemorrhage in hemisphere, subcortical: Secondary | ICD-10-CM | POA: Diagnosis not present

## 2018-01-21 DIAGNOSIS — I481 Persistent atrial fibrillation: Secondary | ICD-10-CM | POA: Diagnosis not present

## 2018-01-21 DIAGNOSIS — R0789 Other chest pain: Secondary | ICD-10-CM | POA: Diagnosis not present

## 2018-01-21 DIAGNOSIS — Z85038 Personal history of other malignant neoplasm of large intestine: Secondary | ICD-10-CM | POA: Diagnosis not present

## 2018-01-21 DIAGNOSIS — I739 Peripheral vascular disease, unspecified: Secondary | ICD-10-CM | POA: Diagnosis present

## 2018-01-21 DIAGNOSIS — R633 Feeding difficulties: Secondary | ICD-10-CM | POA: Diagnosis present

## 2018-01-21 DIAGNOSIS — I69128 Other speech and language deficits following nontraumatic intracerebral hemorrhage: Secondary | ICD-10-CM | POA: Diagnosis not present

## 2018-01-21 DIAGNOSIS — I482 Chronic atrial fibrillation: Secondary | ICD-10-CM | POA: Diagnosis not present

## 2018-01-21 DIAGNOSIS — R079 Chest pain, unspecified: Secondary | ICD-10-CM | POA: Diagnosis not present

## 2018-01-21 DIAGNOSIS — G8929 Other chronic pain: Secondary | ICD-10-CM | POA: Diagnosis present

## 2018-01-21 DIAGNOSIS — R270 Ataxia, unspecified: Secondary | ICD-10-CM | POA: Diagnosis not present

## 2018-01-21 DIAGNOSIS — I10 Essential (primary) hypertension: Secondary | ICD-10-CM | POA: Diagnosis present

## 2018-01-21 DIAGNOSIS — I361 Nonrheumatic tricuspid (valve) insufficiency: Secondary | ICD-10-CM

## 2018-01-21 DIAGNOSIS — J9601 Acute respiratory failure with hypoxia: Secondary | ICD-10-CM | POA: Diagnosis present

## 2018-01-21 DIAGNOSIS — I480 Paroxysmal atrial fibrillation: Secondary | ICD-10-CM | POA: Diagnosis present

## 2018-01-21 DIAGNOSIS — R740 Nonspecific elevation of levels of transaminase and lactic acid dehydrogenase [LDH]: Secondary | ICD-10-CM | POA: Diagnosis not present

## 2018-01-21 DIAGNOSIS — R296 Repeated falls: Secondary | ICD-10-CM | POA: Diagnosis present

## 2018-01-21 DIAGNOSIS — R413 Other amnesia: Secondary | ICD-10-CM | POA: Diagnosis not present

## 2018-01-21 DIAGNOSIS — G479 Sleep disorder, unspecified: Secondary | ICD-10-CM | POA: Diagnosis not present

## 2018-01-21 LAB — PROTIME-INR
INR: 2.02
Prothrombin Time: 22.7 seconds — ABNORMAL HIGH (ref 11.4–15.2)

## 2018-01-21 LAB — ECHOCARDIOGRAM COMPLETE
HEIGHTINCHES: 69 in
Weight: 2819.2 oz

## 2018-01-21 LAB — BRAIN NATRIURETIC PEPTIDE: B NATRIURETIC PEPTIDE 5: 190.4 pg/mL — AB (ref 0.0–100.0)

## 2018-01-21 LAB — TROPONIN I
TROPONIN I: 0.04 ng/mL — AB (ref <0.03)
Troponin I: 0.05 ng/mL (ref <0.03)

## 2018-01-21 SURGERY — LEFT HEART CATH AND CORONARY ANGIOGRAPHY
Anesthesia: LOCAL

## 2018-01-21 MED ORDER — LABETALOL HCL 5 MG/ML IV SOLN: 5.0000 mg | INTRAVENOUS | Status: DC | PRN

## 2018-01-21 MED ORDER — WARFARIN SODIUM 5 MG PO TABS: 5.0000 mg | ORAL_TABLET | Freq: Once | ORAL | Status: DC

## 2018-01-21 MED ORDER — MORPHINE SULFATE (PF) 2 MG/ML IV SOLN
1.0000 mg | INTRAVENOUS | Status: AC
  Administered 2018-01-21: 1 mg via INTRAVENOUS
  Filled 2018-01-21: qty 1

## 2018-01-21 MED ORDER — PANTOPRAZOLE SODIUM 40 MG PO TBEC
40.0000 mg | DELAYED_RELEASE_TABLET | Freq: Every day | ORAL | Status: DC
  Administered 2018-01-21 – 2018-01-23 (×3): 40 mg via ORAL
  Filled 2018-01-21 (×3): qty 1

## 2018-01-21 MED ORDER — INFLUENZA VAC SPLIT QUAD 0.5 ML IM SUSY: 0.5000 mL | PREFILLED_SYRINGE | INTRAMUSCULAR | Status: DC

## 2018-01-21 MED ORDER — SENNOSIDES-DOCUSATE SODIUM 8.6-50 MG PO TABS
1.0000 | ORAL_TABLET | Freq: Two times a day (BID) | ORAL | Status: DC
  Administered 2018-01-21 – 2018-01-23 (×5): 1 via ORAL
  Filled 2018-01-21 (×5): qty 1

## 2018-01-21 MED ORDER — STROKE: EARLY STAGES OF RECOVERY BOOK
Freq: Once | Status: AC
  Administered 2018-01-21: 17:00:00
  Filled 2018-01-21: qty 1

## 2018-01-21 NOTE — Consult Note (Addendum)
Neurology Consultation  Reason for Consult: Left thalamic bleed Referring Physician: Evangeline Gula  CC: Left thalamic intracranial hemorrhage seen on CT  History is obtained from: Chart  HPI: Christian Maxwell is a 84 y.o. male with history of colon cancer, chronic atrial fibrillation, upper lipidemia, hypertension, PVCs, thyroid disease, and myocardial infarct.  Patient initially came to the hospital secondary to chest pain.  While he was in the hospital it was noted by family they have been dizzy, falling, generalized weakness.  Family also reported that he was having trouble feeding himself with difficulty with fine motor skills on the right hand as well as difficulty walking. For that reason a CT of the brain was obtained and did show a left thalamic intracranial bleed that is 7 mm large.  In talking the patient he states he has been having a left arm weakness for approximately 6 months.  He denies any right-sided numbness tingling or weakness.  He denies any visual changes.  Neurology was consulted as patient is on Coumadin and recommendations on how long to hold Coumadin along with blood pressure parameters.   LKW: Unknown tpa given?: no, intercranial bleed Premorbid modified Rankin scale (mRS): 2 Prior stroke scale 2 for right nasolabial fold decrease, and left arm drift ICH Score: 1   ROS: A 14 point ROS was performed and is negative except as noted in the HPI.   Past Medical History:  Diagnosis Date  . Abscess 7/13   abd abscess  . Cancer [facility])    colon cancer  . Chronic atrial fibrillation ([place])   . History of colon cancer 2000   T3N0 s/p colectomy  . History of echocardiogram    a. Echo 2/13:  Mild LVH, EF 55-60%, mild MR, moderate LAE, mild to moderate RAE;  b. Echo 1/17: mild LVH, vigorous LVF, EF 65-70%, no RWMA, trivial AI, trivial MR, severe LAE, mild RAE, mod TR, PASP 32 mmHg  . History of stress test    a. Cardiolite in 9/04: EF 67%, no ischemia.;  b. Myoview 6/16: no  ischemia   . Hx of adenomatous colonic polyps   . Hyperlipidemia   . Hypertension   . METHICILLIN RESISTANT STAPHYLOCOCCUS AUREUS INFECTION 12/12/2006   Annotation: stitch abcess in lower abdomen after colon cancer resection Qualifier: Diagnosis of  By: [name] MD, [name]    . Myocardial infarction ([place]) 1985  . Postoperative stitch abscess 07/19/2011  . PVC's (premature ventricular contractions)   . Syncope and collapse    pt denies  . Thyroid disease    followed by [name]     Family History  Problem Relation Age of Onset  . Stroke Mother   . Stroke Father   . Hypertension Father   . CAD Father 65       MI  . Dementia Father   . Stroke Sister 55  . Stroke Brother   . Stroke Brother   . Diabetes Brother   . CAD Brother 67       MI  . Dementia Sister 68  . Dementia Brother   . Colon cancer Cousin        x4 total with colon cancer  . Cancer Neg Hx   . Colon polyps Neg Hx   . Rectal cancer Neg Hx   . Stomach cancer Neg Hx      Social History:   reports that he quit smoking about 40 years ago. He quit smokeless tobacco use about 40 years ago.  His  smokeless tobacco use included chew. He reports that he does not drink alcohol or use drugs.  Medications  Current Facility-Administered Medications:  .   stroke: mapping our early stages of recovery book, , Does not apply, Once, Marliss Coots, PA-C .  acetaminophen (TYLENOL) tablet 650 mg, 650 mg, Oral, Q4H PRN, Osei-Bonsu, George, MD .  ALPRAZolam Duanne Moron) tablet 0.25 mg, 0.25 mg, Oral, BID PRN, Osei-Bonsu, George, MD .  donepezil (ARICEPT) tablet 10 mg, 10 mg, Oral, QHS, Osei-Bonsu, George, MD, 10 mg at 01/20/18 2310 .  folic acid (FOLVITE) tablet 1 mg, 1 mg, Oral, Daily, Osei-Bonsu, George, MD, 1 mg at 01/21/18 1008 .  furosemide (LASIX) injection 20 mg, 20 mg, Intravenous, Q12H, Osei-Bonsu, George, MD, 20 mg at 01/21/18 0647 .  [START ON 01/22/2018] Influenza vac split quadrivalent PF (FLUARIX) injection 0.5 mL, 0.5 mL,  Intramuscular, Tomorrow-1000, Osei-Bonsu, George, MD .  labetalol (NORMODYNE,TRANDATE) injection 5 mg, 5 mg, Intravenous, Q2H PRN, Black, Karen M, NP .  metoprolol tartrate (LOPRESSOR) tablet 25 mg, 25 mg, Oral, BID, Osei-Bonsu, George, MD, 25 mg at 01/21/18 1008 .  nitroGLYCERIN (NITROSTAT) SL tablet 0.4 mg, 0.4 mg, Sublingual, Q5 min PRN, Osei-Bonsu, George, MD, 0.4 mg at 01/21/18 0525 .  omega-3 acid ethyl esters (LOVAZA) capsule 1 g, 1 g, Oral, Daily, Osei-Bonsu, George, MD, 1 g at 01/21/18 1008 .  ondansetron (ZOFRAN) injection 4 mg, 4 mg, Intravenous, Q6H PRN, Osei-Bonsu, George, MD .  pantoprazole (PROTONIX) EC tablet 40 mg, 40 mg, Oral, Daily, Marliss Coots, PA-C .  senna-docusate (Senokot-S) tablet 1 tablet, 1 tablet, Oral, BID, Marliss Coots, PA-C .  simvastatin (ZOCOR) tablet 40 mg, 40 mg, Oral, q morning - 10a, Osei-Bonsu, George, MD, 40 mg at 01/21/18 1008 .  thiamine (VITAMIN B-1) tablet 100 mg, 100 mg, Oral, Daily, Osei-Bonsu, George, MD, 100 mg at 01/21/18 1007 .  zolpidem (AMBIEN) tablet 5 mg, 5 mg, Oral, QHS PRN, Benito Mccreedy, MD, 5 mg at 01/20/18 2310 Exam: Current vital signs: BP 120/84   Pulse 97   Temp 97.8 F (36.6 C) (Oral)   Resp 19   Ht '5\' 9"'  (1.753 m)   Wt 79.9 kg   SpO2 98%   BMI 26.02 kg/m  Vital signs in last 24 hours: Temp:  [97.8 F (36.6 C)-98.9 F (37.2 C)] 97.8 F (36.6 C) (09/09 0440) Pulse Rate:  [52-97] 97 (09/09 0647) Resp:  [16-31] 19 (09/09 0647) BP: (101-136)/(63-107) 120/84 (09/09 1007) SpO2:  [91 %-100 %] 98 % (09/09 0647) Weight:  [79 kg-83.9 kg] 79.9 kg (09/09 0516) GENERAL: Awake, alert in NAD HEENT: - Normocephalic and atraumatic, dry mm,  Ext: warm, well perfused, intact peripheral pulses, no edema NEURO:  Mental Status: Is alert, oriented to hospital, and why was initial brought to the hospital., speech is clear.  Naming, repetition, fluency, and comprehension intact. Cranial Nerves: PERRL 2 mm/brisk. EOMI, visual fields  full, right nasolabial fold decrease, facial sensation intact, hearing intact, tongue/uvula/soft palate midline, normal Motor: Right arm, bilateral legs, left bicep and tricep show 5/5 strength.  Patient's left shoulder is a 4/5 and patient's left grip is weak secondary to pain.  Tone: is normal and bulk is normal Sensation- Intact to light touch bilaterally, with no neglect Coordination: FTN intact bilaterally,   Labs I have reviewed labs in epic and the results pertinent to this consultation are: CBC    Component Value Date/Time   WBC 14.0 (H) 01/20/2018 1445   RBC 5.50 01/20/2018 1445  HGB 15.5 01/20/2018 1445   HGB 15.3 05/31/2010 1429   HCT 48.7 01/20/2018 1445   HCT 45.2 05/31/2010 1429   PLT 173 01/20/2018 1445   PLT 154 05/31/2010 1429   MCV 88.5 01/20/2018 1445   MCV 88.6 05/31/2010 1429   MCH 28.2 01/20/2018 1445   MCHC 31.8 01/20/2018 1445   RDW 13.1 01/20/2018 1445   RDW 12.9 05/31/2010 1429   LYMPHSABS 1.5 01/20/2018 1445   LYMPHSABS 1.8 05/31/2010 1429   MONOABS 1.4 (H) 01/20/2018 1445   MONOABS 0.6 05/31/2010 1429   EOSABS 0.3 01/20/2018 1445   EOSABS 0.1 05/31/2010 1429   BASOSABS 0.0 01/20/2018 1445   BASOSABS 0.0 05/31/2010 1429    CMP     Component Value Date/Time   NA 137 01/20/2018 1445   NA 142 10/04/2016 0801   K 3.7 01/20/2018 1445   CL 103 01/20/2018 1445   CO2 23 01/20/2018 1445   GLUCOSE 149 (H) 01/20/2018 1445   BUN 18 01/20/2018 1445   BUN 15 10/04/2016 0801   CREATININE 0.93 01/20/2018 1445   CREATININE 0.86 08/14/2012 1242   CALCIUM 9.7 01/20/2018 1445   PROT 6.5 10/04/2016 0801   ALBUMIN 4.2 10/04/2016 0801   AST 23 10/04/2016 0801   ALT 23 10/04/2016 0801   ALKPHOS 57 10/04/2016 0801   BILITOT 0.9 10/04/2016 0801   GFRNONAA >60 01/20/2018 1445   GFRAA >60 01/20/2018 1445    Lipid Panel     Component Value Date/Time   CHOL 147 10/04/2016 0801   TRIG 174 (H) 10/04/2016 0801   HDL 41 10/04/2016 0801   CHOLHDL 3.6  10/04/2016 0801   CHOLHDL 4 11/05/2015 0947   VLDL 36.6 11/05/2015 0947   LDLCALC 71 10/04/2016 0801   Imaging I have reviewed the images obtained: CT-scan of the brain--7 mm hemorrhage in the left thalamus, no evidence of mass-effect.  Assessment: 84 year old male with chronic atrial fibrillation on Coumadin presenting with chest pain and found to have a 7 mm left thalamic bleed.   While in the hospital it was noted that patient had a supratherapeutic INR between 3.5 and 4.  Last few INRs have been within normal limits.   Elevated INR is very well could have been the cause of his thalamic bleed with in addition to possible hypertension.   His clinical exam is reassuring and given small bleed size, I would not reverse just yet.  Impression: Left thalamic ICH likelt secondary to coagulopathy and hypertension  Recommendations: -SCDs -Hold Coumadin-timing of resumption will be advised by the stroke team -Repeat CTH at midnight - consider coumadin reversal if bleed increases or exam worsens -No antiplatelet or anticoagulant -Keep systolic blood pressure less than 140 -MRI Brain with and without contrast to r/o met/other causes of bleed othen than HTN  Discussed in detail with daughter at bedside and another daughter via phone conference and answered all questions.  Stroke team will follow in AM.  -- Amie Portland, MD [facility] Pager: [phone] If 7pm to 7am, please call on call as listed on AMION.

## 2018-01-21 NOTE — Progress Notes (Signed)
Progress Note  Patient Name: Christian Maxwell Date of Encounter: 01/21/2018  Primary Cardiologist: Dr. Acie Fredrickson  Subjective   Chest pain much less significant this morning.  Feels better.  He is asking about going home.  Inpatient Medications    Scheduled Meds: . aspirin  81 mg Oral Daily  . donepezil  10 mg Oral QHS  . folic acid  1 mg Oral Daily  . furosemide  20 mg Intravenous Q12H  . [START ON 01/22/2018] Influenza vac split quadrivalent PF  0.5 mL Intramuscular Tomorrow-1000  . metoprolol tartrate  25 mg Oral BID  . omega-3 acid ethyl esters  1 g Oral Daily  . simvastatin  40 mg Oral q morning - 10a  . thiamine  100 mg Oral Daily  . Warfarin - Pharmacist Dosing Inpatient   Does not apply q1800   Continuous Infusions:  PRN Meds: acetaminophen, ALPRAZolam, nitroGLYCERIN, ondansetron (ZOFRAN) IV, zolpidem   Vital Signs    Vitals:   01/21/18 0536 01/21/18 0537 01/21/18 0550 01/21/18 0647  BP:   118/63 (S) (!) 126/107  Pulse: 87 97 79 97  Resp: 17 19 (!) 22 19  Temp:      TempSrc:      SpO2: 91% 93% 97% 98%  Weight:      Height:        Intake/Output Summary (Last 24 hours) at 01/21/2018 0831 Last data filed at 01/21/2018 0500 Gross per 24 hour  Intake 50 ml  Output 800 ml  Net -750 ml   Filed Weights   01/20/18 1508 01/20/18 1839 01/21/18 0516  Weight: 83.9 kg 79 kg 79.9 kg    Telemetry    Atrial fibrillation with controlled ventricular response- Personally Reviewed  ECG    Atrial fibrillation- Personally Reviewed  Physical Exam   GEN: No acute distress.  Frail-appearing Neck: No JVD Cardiac:  Irregularly irregular, no murmurs, rubs, or gallops.  Respiratory: Clear to auscultation bilaterally. GI: Soft, nontender, non-distended  MS: No edema; No deformity. Neuro:   Strength in both arms and both legs are grossly normal and symmetric Psych: Flat affect   Labs    Chemistry Recent Labs  Lab 01/20/18 1445  NA 137  K 3.7  CL 103  CO2 23    GLUCOSE 149*  BUN 18  CREATININE 0.93  CALCIUM 9.7  GFRNONAA >60  GFRAA >60  ANIONGAP 11     Hematology Recent Labs  Lab 01/20/18 1445  WBC 14.0*  RBC 5.50  HGB 15.5  HCT 48.7  MCV 88.5  MCH 28.2  MCHC 31.8  RDW 13.1  PLT 173    Cardiac Enzymes Recent Labs  Lab 01/20/18 1955 01/20/18 2304 01/21/18 0157  TROPONINI 0.06* 0.05* 0.04*    Recent Labs  Lab 01/20/18 1453  TROPIPOC 0.09*     BNP Recent Labs  Lab 01/20/18 1445 01/21/18 0157  BNP 299.1* 190.4*     DDimer No results for input(s): DDIMER in the last 168 hours.   Radiology    Dg Chest 2 View  Result Date: 01/20/2018 CLINICAL DATA:  Left chest pressure and shortness of breath EXAM: CHEST - 2 VIEW COMPARISON:  04/03/2016 FINDINGS: Mild enlargement of the cardiopericardial silhouette. Tortuous thoracic aorta. Atherosclerotic calcification of the aortic arch. Low lung volumes are present, causing crowding of the pulmonary vasculature. Bibasilar linear opacities favoring mild atelectasis. Hazy density along the lower lobes primarily on the lateral projection is partially due to crowding of pulmonary vasculature but a component  of ground-glass density or interstitial accentuation is difficult to exclude based on the lateral projection, and is primarily retro diaphragmatic. IMPRESSION: 1. Indistinct lower lobe opacities best seen on the lateral projection, potentially from alveolitis, low-grade edema, or atypical infectious process. There is also some mild bibasilar atelectasis. 2. Mild enlargement of the cardiopericardial silhouette. 3.  Aortic Atherosclerosis (ICD10-I70.0). 4. Low lung volumes are present, causing crowding of the pulmonary vasculature. Electronically Signed   By: Van Clines M.D.   On: 01/20/2018 15:01   Ct Angio Chest Pe W And/or Wo Contrast  Result Date: 01/20/2018 CLINICAL DATA:  Acute onset chest pressure this morning with shortness of breath. Taking Coumadin for atrial fibrillation.  EXAM: CT ANGIOGRAPHY CHEST WITH CONTRAST TECHNIQUE: Multidetector CT imaging of the chest was performed using the standard protocol during bolus administration of intravenous contrast. Multiplanar CT image reconstructions and MIPs were obtained to evaluate the vascular anatomy. CONTRAST:  156mL ISOVUE-370 IOPAMIDOL (ISOVUE-370) INJECTION 76% COMPARISON:  Chest radiographs obtained today. Chest CT dated 08/24/2004. Chest radiographs dated 04/03/2016. FINDINGS: Cardiovascular: Mild atheromatous arterial calcifications, including coronary artery and aortic calcifications. Enlarged heart. Normally opacified pulmonary arteries with no pulmonary arterial filling defects seen. Mediastinum/Nodes: No enlarged mediastinal, hilar, or axillary lymph nodes. Thyroid gland, trachea, and esophagus demonstrate no significant findings. Lungs/Pleura: Mild bilateral dependent atelectasis, corresponding to the densities seen on the radiographs earlier today. Upper Abdomen: Partially included gallbladder on the last images, filled with multiple small stones, measuring up to 3 mm in diameter each. No visible gallbladder wall thickening or pericholecystic fluid. Musculoskeletal: Old, healed right posterior rib fractures. No acute fracture seen. Approximately 10% T1 vertebral superior endplate compression deformity with minimal bony retropulsion, sclerosis and no acute fracture lines. Approximately 40% T4 vertebral compression deformity with no bony retropulsion and no acute fracture lines. The T4 vertebral body compression deformity is unchanged since 04/03/2016. The T1 level was obscured by the shoulders at that time. Review of the MIP images confirms the above findings. IMPRESSION: 1. No pulmonary emboli. 2. Mild bilateral dependent atelectasis. 3. Cholelithiasis. 4. Old T1 and T4 vertebral compression fractures. 5. Mild calcific coronary artery and aortic atherosclerosis. Aortic Atherosclerosis (ICD10-I70.0). Electronically Signed   By:  Claudie Revering M.D.   On: 01/20/2018 16:24    Cardiac Studies   Minimal troponin elevation  Patient Profile     84 y.o. male with chronic atrial fibrillation  Assessment & Plan    #1) chest pain/minimally elevated troponin: Flat trend on the troponin.  Does not seem like acute coronary syndrome.  His symptoms are improved.  Would not plan on invasive testing at this time.  See below for details regarding my discussion with his daughter.  Echocardiogram pending.  #2) atrial fibrillation: Rate controlled.  Coumadin for stroke prevention.  #3) neuro: The primary reason he came to the hospital was due to loss of fine motor function.  He was unable to feed himself while at a restaurant.  He was then unable to walk.  The family brought him to the emergency room.  The chest pain has been a chronic issue for many years.  Given the low level troponin and the flat trend, I doubt this is his primary issue.  Will await results of echocardiogram.  If no significant abnormality and the patient's symptoms remain controlled, would likely hold off on any invasive cardiac testing given his overall situation and neurologic symptoms.    #4) currently, he lives alone at home.  This may have to change  given his new neurologic symptoms.  He will eventually need a case manager involved to help with placement.  We will continue to follow.     For questions or updates, please contact [employer] Please consult [email or web address] for contact info under        Signed, Larae Grooms, MD  01/21/2018, 8:31 AM

## 2018-01-21 NOTE — Progress Notes (Addendum)
Messaged Cardio Dr. Cristobal Goldmann  6E 9 c/o CP 10/10 given nitro SBP dropped to 1010/65 from 136/76 , HR 89, ekg Afib pvcs, brought pain to 8/10. please advise.  O2 placed [place] 2L, Sp02 91 to 93%  MD returned call and ordered 1mg  Morphine now IV.

## 2018-01-21 NOTE — Evaluation (Signed)
Speech Language Pathology Evaluation Patient Details Name: Christian Maxwell MRN: [id number] DOB: January 27, 1933 Today's Date: 01/21/2018 Time: 1937-9024 SLP Time Calculation (min) (ACUTE ONLY): 25 min  Problem List:  Patient Active Problem List   Diagnosis Date Noted  . Ataxia 01/21/2018  . Bleeding in brain (Christian Maxwell) 01/21/2018  . Hypoxia 01/21/2018  . Chest pain 01/20/2018  . Elevated troponin I level   . Hearing impairment 11/11/2015  . Olecranon bursitis of left elbow 11/11/2015  . LBBB (left bundle branch block) 05/27/2015  . Senile purpura (Christian Maxwell) 02/22/2015  . Wound, open, arm, forearm 02/22/2015  . Advanced care planning/counseling discussion 11/06/2014  . Medicare annual wellness visit, initial 11/06/2014  . Bilateral knee pain 03/17/2014  . DOE (dyspnea on exertion) 02/16/2014  . Encounter for therapeutic drug monitoring 06/17/2013  . Memory impairment 03/06/2013  . MRSA colonization 12/12/2012  . Nevus of sternal region 12/04/2012  . Seasonal allergic rhinitis 10/22/2012  . Thyroid disease   . Suprapubic abscess 08/21/2012  . H/O colon cancer, stage I 06/24/2012  . Adenomatous colon polyp 06/24/2012  . Open wound-left arm and suprapubic area. 03/11/2012  . Chest discomfort 04/21/2011  . Hyperlipidemia 10/14/2010  . Atrial fibrillation (Christian Maxwell) 08/08/2010  . INSOMNIA, MILD 12/12/2006   Past Medical History:  Past Medical History:  Diagnosis Date  . Abscess 7/13   abd abscess  . Cancer [facility])    colon cancer  . Chronic atrial fibrillation ([place])   . History of colon cancer 2000   T3N0 s/p colectomy  . History of echocardiogram    a. Echo 2/13:  Mild LVH, EF 55-60%, mild MR, moderate LAE, mild to moderate RAE;  b. Echo 1/17: mild LVH, vigorous LVF, EF 65-70%, no RWMA, trivial AI, trivial MR, severe LAE, mild RAE, mod TR, PASP 32 mmHg  . History of stress test    a. Cardiolite in 9/04: EF 67%, no ischemia.;  b. Myoview 6/16: no ischemia   . Hx of adenomatous colonic polyps    . Hyperlipidemia   . Hypertension   . METHICILLIN RESISTANT STAPHYLOCOCCUS AUREUS INFECTION 12/12/2006   Annotation: stitch abcess in lower abdomen after colon cancer resection Qualifier: Diagnosis of  By: [name] MD, [name]    . Myocardial infarction ([place]) 1985  . Postoperative stitch abscess 07/19/2011  . PVC's (premature ventricular contractions)   . Syncope and collapse    pt denies  . Thyroid disease    followed by [name]   Past Surgical History:  Past Surgical History:  Procedure Laterality Date  . COLECTOMY  2000   6 inches removed  . COLONOSCOPY  2009   polyp, h/o cancer [name])  . EYE SURGERY     cataract surgery both eyes- pts ates he has never had cataract surgery 10-26-15  . HERNIA REPAIR  0/9735   DB umbilical hernia  . INCISE AND DRAIN ABCESS  2009,2010,2011,2012  . INCISION AND DRAINAGE Right 04/02/2013   Procedure: INCISION AND DRAINAGE RIGHT KNEE HEMATOMA;  Surgeon: [name], MD;  Location: [place];  Service: Orthopedics;  Laterality: Right;  . INCISION AND DRAINAGE ABSCESS N/A 09/20/2012   Procedure: INCISION AND DRAINAGE SUPRAPUBIC ABSCESS;  Surgeon: [name], MD;  Location: [employer];  Service: General;  Laterality: N/A;  . INCISION AND DRAINAGE ABSCESS Left 02/28/2013   Procedure: INCISION AND DRAINAGE LEFT ARM ABSCESS;  Surgeon: [name], MD;  Location: [place];  Service: General;  Laterality: Left;  . LUMBAR LAMINECTOMY  1977  .  MASS EXCISION  03/07/2012   EXCISION MASS;  Surgeon: [name], MD;  Laterality: Right;  removal mass posterior right arm  . POLYPECTOMY    . WOUND EXPLORATION  2006   For possible stitch abscess Dr [name]   HPI:   Christian Maxwell is a 84 y.o. male with medical history significant of for but not limited to chronic A. Fib on coagulation and CAD presented with chest pain.  He noted the chest pain upon awakening around 3 AM this morning to use the restroom. Patient states that the  pain has been constant since that time, occurring every time he takes a breath. He took an aspirin and nitro with no improvement. He denies any history of previous pain. He is on Coumadin for his atrial fibrillation and denies any missed doses. His last INR was checked 5 days ago where it was 2.9. Associated with shortness of breath.EMS reports oxygen of mid 80s and placed him on 4 L prior to arrival. He denies fever or chills. No cough. No abdominal pain, back pain, nausea, vomiting or diaphoresis. MRI showed 7 mm hemorrhage in the left thalamus.   Assessment / Plan / Recommendation Clinical Impression  Patient's language and cognition are at baseline for memory, repitition, processing and reasoning. He reports there have been no changes in his speech or language during this hospitalization. No recommendations for speech/language therapy at this time. Will sign off.     SLP Assessment  SLP Recommendation/Assessment: Patient does not need any further Speech Lanaguage Pathology Services SLP Visit Diagnosis: Cognitive communication deficit (R41.841)                     SLP Evaluation Cognition  Overall Cognitive Status: Within Functional Limits for tasks assessed Arousal/Alertness: Awake/alert Orientation Level: Oriented X4 Memory: Appears intact Awareness: Appears intact Problem Solving: Appears intact Safety/Judgment: Appears intact       Comprehension  Auditory Comprehension Overall Auditory Comprehension: Appears within functional limits for tasks assessed Yes/No Questions: Within Functional Limits Commands: Within Functional Limits Conversation: Complex Visual Recognition/Discrimination Discrimination: Within Function Limits Reading Comprehension Reading Status: Within funtional limits    Expression Expression Primary Mode of Expression: Verbal Verbal Expression Initiation: No impairment   Oral / Motor  Motor Speech Overall Motor Speech: Appears within  functional limits for tasks assessed Respiration: Within functional limits Phonation: Normal Resonance: Within functional limits Articulation: Within functional limitis Intelligibility: Intelligible Motor Planning: Witnin functional limits   GO                    Charlynne Cousins Cambryn Charters, MA, CCC-SLP 01/21/2018 4:32 PM

## 2018-01-21 NOTE — Progress Notes (Signed)
Preliminary notes--Bilateral carotid duplex exam completed.  Bilateral ICAs 1-39% stenosis. Bilateral vertebral arteries antegrade flow.  Christian Maxwell (RDMS RVT) 01/21/18 3:18 PM

## 2018-01-21 NOTE — Progress Notes (Signed)
[facility] PROGRESS NOTE  Christian Maxwell TOI:[id number] DOB: 01-23-1933 DOA: 01/20/2018 PCP: Ria Bush, MD  Assessment/Plan: 1.Chest pain: Resolved this am Atypical - but concerning givben hypoxic event prior to  ED which is not explained by pulmonary workup. Chest x-ray with indistinct lowere love opacities and CTA scan without PE, mild atelectasis mildly elevated troponin of 0.09>>0.06>>0.04. Afebrile with mild leukocytosis Telemetry monitoring Serial cardiac enzymes 2-D echocardiogram results pending Supportive care  await Cardiac consult  2. Bleeding in the brain. CT head with 75mm hemorrhage in the left thalamus. Family reports several day hx of generalized weakness, ataxia, frequent falls. Daughter states he can "no longer feed himself" Labetalol prn per neurology Stop coumadin SCD Appreciate neuro assistance  3. Atrial fibrillation: Rate controlled monitore  3. Dyspnea/reported Hypoxia in ED: improved this am. Oxygen saturation level greater than 90% on 2L. Not on home oxygen. Afebrile, hemodynamically stable. Mild leukocytosis. St 79.9kg down from 83kg on admission CXR -? [place].infection CTA neg PE, no air space disease No evidence of clinical bronchospastic disease BNP 190 Continue IV lasix 20 mg VI every 12 hours Monitor closely Wean oxygen as able  4. Frequent falls/ataxia. Likely related to above. CT head as noted above Appreciate neuro assistance   Code Status: full Family Communication: daughter at bedside Disposition Plan: to be determined   Consultants:  [facility] neuro  cardiology  Procedures:  none  Antibiotics:  none  HPI/Subjective: 84 yo admitted for complaints of chest pain. Family reveals several day hx ataxia and generalized weakness and difficulty feeding self. Evaluation reveals elevated troponin, hypoxia. Family shares later that pt having ataxia, frequent falls and difficulty feeding self of late.   Objective: Vitals:    01/21/18 0647 01/21/18 1007  BP: (S) (!) 126/107 120/84  Pulse: 97   Resp: 19   Temp:    SpO2: 98%     Intake/Output Summary (Last 24 hours) at 01/21/2018 1234 Last data filed at 01/21/2018 0500 Gross per 24 hour  Intake 50 ml  Output 800 ml  Net -750 ml   Filed Weights   01/20/18 1508 01/20/18 1839 01/21/18 0516  Weight: 83.9 kg 79 kg 79.9 kg    Exam:   General:  Thin alert in no acute distress  Cardiovascular: irregularly irregular  Respiratory: normal effort. respers slightly shallow. BS distant no wheeze or crackles  Abdomen: flat soft +BS no guarding or rebounding  Musculoskeletal: both knees bruised without swelling. Left wrist quite bruised and somewhat swollen and very tender to palpation.   Neuro: alert and oriented x3. Speech clear facial symmetry  Data Reviewed: Basic Metabolic Panel: Recent Labs  Lab 01/20/18 1445  NA 137  K 3.7  CL 103  CO2 23  GLUCOSE 149*  BUN 18  CREATININE 0.93  CALCIUM 9.7   Liver Function Tests: No results for input(s): AST, ALT, ALKPHOS, BILITOT, PROT, ALBUMIN in the last 168 hours. No results for input(s): LIPASE, AMYLASE in the last 168 hours. No results for input(s): AMMONIA in the last 168 hours. CBC: Recent Labs  Lab 01/20/18 1445  WBC 14.0*  NEUTROABS 10.8*  HGB 15.5  HCT 48.7  MCV 88.5  PLT 173   Cardiac Enzymes: Recent Labs  Lab 01/20/18 1955 01/20/18 2304 01/21/18 0157  TROPONINI 0.06* 0.05* 0.04*   BNP (last 3 results) Recent Labs    01/20/18 1445 01/21/18 0157  BNP 299.1* 190.4*    ProBNP (last 3 results) No results for input(s): PROBNP in the last 8760 hours.  CBG: No results for input(s): GLUCAP in the last 168 hours.  No results found for this or any previous visit (from the past 240 hour(s)).   Studies: Dg Chest 2 View  Result Date: 01/20/2018 CLINICAL DATA:  Left chest pressure and shortness of breath EXAM: CHEST - 2 VIEW COMPARISON:  04/03/2016 FINDINGS: Mild enlargement of  the cardiopericardial silhouette. Tortuous thoracic aorta. Atherosclerotic calcification of the aortic arch. Low lung volumes are present, causing crowding of the pulmonary vasculature. Bibasilar linear opacities favoring mild atelectasis. Hazy density along the lower lobes primarily on the lateral projection is partially due to crowding of pulmonary vasculature but a component of ground-glass density or interstitial accentuation is difficult to exclude based on the lateral projection, and is primarily retro diaphragmatic. IMPRESSION: 1. Indistinct lower lobe opacities best seen on the lateral projection, potentially from alveolitis, low-grade edema, or atypical infectious process. There is also some mild bibasilar atelectasis. 2. Mild enlargement of the cardiopericardial silhouette. 3.  Aortic Atherosclerosis (ICD10-I70.0). 4. Low lung volumes are present, causing crowding of the pulmonary vasculature. Electronically Signed   By: Van Clines M.D.   On: 01/20/2018 15:01   Ct Head Wo Contrast  Result Date: 01/21/2018 CLINICAL DATA:  Neurologic change. EXAM: CT HEAD WITHOUT CONTRAST TECHNIQUE: Contiguous axial images were obtained from the base of the skull through the vertex without intravenous contrast. COMPARISON:  None. FINDINGS: Brain: Small hemorrhage in the left thalamus. No evidence of significant mass effect. Mild brain parenchymal volume loss and periventricular microangiopathy. Vascular: Calcific atherosclerotic disease at the skull base. Skull: Normal. Negative for fracture or focal lesion. Sinuses/Orbits: No acute finding. Other: None. IMPRESSION: 7 mm hemorrhage in the left thalamus. No evidence of mass effect. Moderate brain parenchymal volume loss and microangiopathy. These results were called by telephone at the time of interpretation on 01/21/2018 at 11:13 am to Dr. Evangeline Gula, who verbally acknowledged these results. Electronically Signed   By: Fidela Salisbury M.D.   On: 01/21/2018 11:17    Ct Angio Chest Pe W And/or Wo Contrast  Result Date: 01/20/2018 CLINICAL DATA:  Acute onset chest pressure this morning with shortness of breath. Taking Coumadin for atrial fibrillation. EXAM: CT ANGIOGRAPHY CHEST WITH CONTRAST TECHNIQUE: Multidetector CT imaging of the chest was performed using the standard protocol during bolus administration of intravenous contrast. Multiplanar CT image reconstructions and MIPs were obtained to evaluate the vascular anatomy. CONTRAST:  125mL ISOVUE-370 IOPAMIDOL (ISOVUE-370) INJECTION 76% COMPARISON:  Chest radiographs obtained today. Chest CT dated 08/24/2004. Chest radiographs dated 04/03/2016. FINDINGS: Cardiovascular: Mild atheromatous arterial calcifications, including coronary artery and aortic calcifications. Enlarged heart. Normally opacified pulmonary arteries with no pulmonary arterial filling defects seen. Mediastinum/Nodes: No enlarged mediastinal, hilar, or axillary lymph nodes. Thyroid gland, trachea, and esophagus demonstrate no significant findings. Lungs/Pleura: Mild bilateral dependent atelectasis, corresponding to the densities seen on the radiographs earlier today. Upper Abdomen: Partially included gallbladder on the last images, filled with multiple small stones, measuring up to 3 mm in diameter each. No visible gallbladder wall thickening or pericholecystic fluid. Musculoskeletal: Old, healed right posterior rib fractures. No acute fracture seen. Approximately 10% T1 vertebral superior endplate compression deformity with minimal bony retropulsion, sclerosis and no acute fracture lines. Approximately 40% T4 vertebral compression deformity with no bony retropulsion and no acute fracture lines. The T4 vertebral body compression deformity is unchanged since 04/03/2016. The T1 level was obscured by the shoulders at that time. Review of the MIP images confirms the above findings. IMPRESSION: 1. No pulmonary emboli.  2. Mild bilateral dependent atelectasis. 3.  Cholelithiasis. 4. Old T1 and T4 vertebral compression fractures. 5. Mild calcific coronary artery and aortic atherosclerosis. Aortic Atherosclerosis (ICD10-I70.0). Electronically Signed   By: Claudie Revering M.D.   On: 01/20/2018 16:24    Scheduled Meds: .  stroke: mapping our early stages of recovery book   Does not apply Once  . donepezil  10 mg Oral QHS  . folic acid  1 mg Oral Daily  . furosemide  20 mg Intravenous Q12H  . [START ON 01/22/2018] Influenza vac split quadrivalent PF  0.5 mL Intramuscular Tomorrow-1000  . metoprolol tartrate  25 mg Oral BID  . omega-3 acid ethyl esters  1 g Oral Daily  . pantoprazole  40 mg Oral Daily  . senna-docusate  1 tablet Oral BID  . simvastatin  40 mg Oral q morning - 10a  . thiamine  100 mg Oral Daily   Continuous Infusions:  Principal Problem:   Bleeding in brain [facility]) Active Problems:   Ataxia   Atrial fibrillation ([facility])   Chest pain   Elevated troponin I level   Hypoxia    Time spent: [facility]  If 7PM-7AM, please contact night-coverage at [email or web address], password [facility] 01/21/2018, 12:34 PM  LOS: 0 days

## 2018-01-21 NOTE — Plan of Care (Signed)
Patient and daughter vocalized wanting the Flu vaccine before Discharge.  Handout provided and med ordered

## 2018-01-21 NOTE — Progress Notes (Signed)
Pt refusing Q2 neuro checks and repeat CT at midnight. Pt states "I want my medicine. I need my sleep. The CT can wait til the morning."  Neuro check showed sluggish & 20mm pupils but otherwise normal. MD on call notified through [place]. Will continue to monitor.

## 2018-01-21 NOTE — Progress Notes (Signed)
  Echocardiogram 2D Echocardiogram has been performed.  Jennette Dubin 01/21/2018, 9:59 AM

## 2018-01-21 NOTE — Progress Notes (Addendum)
ANTICOAGULATION CONSULT NOTE - Follow-Up Consult  Pharmacy Consult for warfarin Indication: afib  No Known Allergies  Patient Measurements: Height: 5\' 9"  (175.3 cm) Weight: 176 lb 3.2 oz (79.9 kg) IBW/kg (Calculated) : 70.7   Vital Signs: Temp: 97.8 F (36.6 C) (09/09 0440) Temp Source: Oral (09/09 0440) BP: 126/107 (09/09 0647) Pulse Rate: 97 (09/09 0647)  Labs: Recent Labs    01/20/18 1445 01/20/18 1617 01/20/18 1955 01/20/18 2304 01/21/18 0157  HGB 15.5  --   --   --   --   HCT 48.7  --   --   --   --   PLT 173  --   --   --   --   LABPROT  --  23.9*  --   --  22.7*  INR  --  2.16  --   --  2.02  CREATININE 0.93  --   --   --   --   TROPONINI  --   --  0.06* 0.05* 0.04*    Estimated Creatinine Clearance: 60.2 mL/min (by C-G formula based on SCr of 0.93 mg/dL).   Medical History: Past Medical History:  Diagnosis Date  . Abscess 7/13   abd abscess  . Cancer [facility])    colon cancer  . Chronic atrial fibrillation ([place])   . History of colon cancer 2000   T3N0 s/p colectomy  . History of echocardiogram    a. Echo 2/13:  Mild LVH, EF 55-60%, mild MR, moderate LAE, mild to moderate RAE;  b. Echo 1/17: mild LVH, vigorous LVF, EF 65-70%, no RWMA, trivial AI, trivial MR, severe LAE, mild RAE, mod TR, PASP 32 mmHg  . History of stress test    a. Cardiolite in 9/04: EF 67%, no ischemia.;  b. Myoview 6/16: no ischemia   . Hx of adenomatous colonic polyps   . Hyperlipidemia   . Hypertension   . METHICILLIN RESISTANT STAPHYLOCOCCUS AUREUS INFECTION 12/12/2006   Annotation: stitch abcess in lower abdomen after colon cancer resection Qualifier: Diagnosis of  By: [name] MD, [name]    . Myocardial infarction ([place]) 1985  . Postoperative stitch abscess 07/19/2011  . PVC's (premature ventricular contractions)   . Syncope and collapse    pt denies  . Thyroid disease    followed by [name]    Assessment: 83 yoM with afib on warfarin PTA. INR therapeutic today at 2.02 but  down slightly from admit.  **Home warfarin dose 5mg  daily except 2.5mg  on Mondays  Goal of Therapy:  INR 2-3 Monitor platelets by anticoagulation protocol: Yes   Plan:  -Warfarin 5mg  PO x1 tonight -Daily INR  ADDENDUM: Head CT positive for hemorrhage, neuro recommended to hold warfarin for 7 days.  Arrie Senate, PharmD, BCPS Clinical Pharmacist [phone] Please check AMION for all [place] numbers 01/21/2018

## 2018-01-22 ENCOUNTER — Inpatient Hospital Stay (HOSPITAL_COMMUNITY): Payer: Medicare Other

## 2018-01-22 DIAGNOSIS — I619 Nontraumatic intracerebral hemorrhage, unspecified: Secondary | ICD-10-CM

## 2018-01-22 DIAGNOSIS — R269 Unspecified abnormalities of gait and mobility: Secondary | ICD-10-CM

## 2018-01-22 DIAGNOSIS — R270 Ataxia, unspecified: Secondary | ICD-10-CM

## 2018-01-22 DIAGNOSIS — I481 Persistent atrial fibrillation: Secondary | ICD-10-CM

## 2018-01-22 DIAGNOSIS — I610 Nontraumatic intracerebral hemorrhage in hemisphere, subcortical: Secondary | ICD-10-CM

## 2018-01-22 DIAGNOSIS — F0390 Unspecified dementia without behavioral disturbance: Secondary | ICD-10-CM | POA: Diagnosis present

## 2018-01-22 LAB — GLUCOSE, CAPILLARY: GLUCOSE-CAPILLARY: 166 mg/dL — AB (ref 70–99)

## 2018-01-22 LAB — LIPID PANEL
CHOLESTEROL: 216 mg/dL — AB (ref 0–200)
HDL: 45 mg/dL (ref >40)
LDL Cholesterol: 143 mg/dL — ABNORMAL HIGH (ref 0–99)
Total CHOL/HDL Ratio: 4.8 RATIO
Triglycerides: 138 mg/dL (ref <150)
VLDL: 28 mg/dL (ref 0–40)

## 2018-01-22 LAB — HEMOGLOBIN A1C
Hgb A1c MFr Bld: 5.9 % — ABNORMAL HIGH (ref 4.8–5.6)
MEAN PLASMA GLUCOSE: 122.63 mg/dL

## 2018-01-22 LAB — PROTIME-INR
INR: 1.48
PROTHROMBIN TIME: 17.8 s — AB (ref 11.4–15.2)

## 2018-01-22 MED ORDER — ATORVASTATIN CALCIUM 40 MG PO TABS
40.0000 mg | ORAL_TABLET | Freq: Every day | ORAL | Status: DC
  Administered 2018-01-22 – 2018-01-23 (×2): 40 mg via ORAL
  Filled 2018-01-22 (×2): qty 1

## 2018-01-22 MED ORDER — GADOBUTROL 1 MMOL/ML IV SOLN
7.5000 mL | Freq: Once | INTRAVENOUS | Status: AC | PRN
  Administered 2018-01-22: 6 mL via INTRAVENOUS

## 2018-01-22 MED ORDER — MORPHINE SULFATE (PF) 2 MG/ML IV SOLN
2.0000 mg | Freq: Once | INTRAVENOUS | Status: AC
  Administered 2018-01-22: 1 mg via INTRAVENOUS
  Filled 2018-01-22: qty 1

## 2018-01-22 MED ORDER — ZOLPIDEM TARTRATE 5 MG PO TABS
5.0000 mg | ORAL_TABLET | Freq: Once | ORAL | Status: AC
  Administered 2018-01-22: 5 mg via ORAL
  Filled 2018-01-22: qty 1

## 2018-01-22 NOTE — Evaluation (Signed)
Physical Therapy Evaluation Patient Details Name: Christian Maxwell MRN: [id number] DOB: [birth year] Today's Date: 01/22/2018   History of Present Illness  Pt is an 84 y.o. male admitted 01/20/18 with c/o chest pain. Family shares that pt having ataxia, frequent falls and difficulty feeding self of late. CT and MRI show 7 mm hemorrhage in the left thalamus. PMH includes HTN, colon CA, a-fib.    Clinical Impression  Pt presents with an overall decrease in functional mobility secondary to above. PTA, pt indep and lives alone, endorses two falls in the past two months. Today, pt presents with generalized weakness, L shoulder ROM impairments (likely due to recent fall), decreased safety awareness, poor memory, and slowed processing. Daughter present and reports this is not pt's baseline cognition. Pt requires close min guard for balance amb with RW; further distance limited due to c/o active chest pain (RN notified; VSS). Increased time spent in discussion with daughter regarding recs for follow-up rehab services and need for 24/7 supervision. Currently recommending intensive CIR-level therapies. Will follow acutely to address established goals.    Follow Up Recommendations CIR;Supervision/Assistance - 24 hour    Equipment Recommendations  None recommended by PT    Recommendations for Other Services       Precautions / Restrictions Precautions Precautions: Fall Precaution Comments: Chest pain Restrictions Weight Bearing Restrictions: No      Mobility  Bed Mobility Overal bed mobility: Needs Assistance Bed Mobility: Supine to Sit     Supine to sit: Mod assist;HOB elevated     General bed mobility comments: Pt insistent HOB be elevated and not willing attempt bed mobility without assist. ModA for trunk elevation. C/o severe chest pain upon sitting; RN notified and present  Transfers Overall transfer level: Needs assistance Equipment used: Rolling walker (2 wheeled) Transfers: Sit  to/from Stand Sit to Stand: Min assist;From elevated surface         General transfer comment: Pt able to stand on third attempt with minA for trunk elevation; repeated cues for correct hand placement as pt repeatedly attempting to pull on RW  Ambulation/Gait Ambulation/Gait assistance: Min guard Gait Distance (Feet): 15 Feet Assistive device: Rolling walker (2 wheeled) Gait Pattern/deviations: Step-through pattern;Decreased stride length;Trunk flexed Gait velocity: Decreased Gait velocity interpretation: <1.8 ft/sec, indicate of risk for recurrent falls General Gait Details: Min guard for balance, pt with poor safety awareness. Distance limited secondary to active chest pain; VSS  Stairs            Wheelchair Mobility    Modified Rankin (Stroke Patients Only) Modified Rankin (Stroke Patients Only) Pre-Morbid Rankin Score: No significant disability Modified Rankin: Moderately severe disability     Balance Overall balance assessment: Needs assistance   Sitting balance-Leahy Scale: Fair       Standing balance-Leahy Scale: Poor                               Pertinent Vitals/Pain Pain Assessment: Faces Faces Pain Scale: Hurts little more Pain Location: L shoulder/scapula Pain Descriptors / Indicators: Guarding;Grimacing Pain Intervention(s): Limited activity within patient's tolerance;Monitored during session    Home Living Family/patient expects to be discharged to:: Private residence Living Arrangements: Alone Available Help at Discharge: Family;Available PRN/intermittently Type of Home: House Home Access: Stairs to enter Entrance Stairs-Rails: Right;Left Entrance Stairs-Number of Steps: 5 Home Layout: Two level;Able to live on main level with bedroom/bathroom Home Equipment: Gilford Rile - 2 wheels  Prior Function Level of Independence: Independent         Comments: Per daughter, pt recently shared with family regarding falls at home. Pt  reports 2 falls in the past two months     Hand Dominance        Extremity/Trunk Assessment   Upper Extremity Assessment Upper Extremity Assessment: Generalized weakness;LUE deficits/detail;RUE deficits/detail LUE Deficits / Details: LUE pain and ROM since fall at home, difficult to assess true function due to pt guarding and not following commands consistently. Decrease shoulder flexion/abd seems to stem from limited scap ROM    Lower Extremity Assessment Lower Extremity Assessment: Generalized weakness(Weakness seems symmetrical, difficult to assess due to pt not consistently following MMT instructions)    Cervical / Trunk Assessment Cervical / Trunk Assessment: Kyphotic  Communication   Communication: HOH  Cognition Arousal/Alertness: Awake/alert Behavior During Therapy: Flat affect Overall Cognitive Status: Impaired/Different from baseline Area of Impairment: Attention;Memory;Following commands;Safety/judgement;Awareness;Problem solving                   Current Attention Level: Sustained Memory: Decreased short-term memory Following Commands: Follows one step commands inconsistently Safety/Judgement: Decreased awareness of safety;Decreased awareness of deficits Awareness: Emergent Problem Solving: Slow processing;Difficulty sequencing;Requires verbal cues General Comments: Daughter present and reports cognition worse than baseline, but difficult to determine if cognition worsening PTA or all new onset since hemorrhage. Very poor safety awareness throughout session. Seems to be masking symptoms. Potential h/o dementia      General Comments General comments (skin integrity, edema, etc.): Daughter present during session    Exercises     Assessment/Plan    PT Assessment Patient needs continued PT services  PT Problem List Decreased strength;Decreased activity tolerance;Decreased balance;Decreased mobility;Decreased cognition;Decreased knowledge of use of  DME;Decreased safety awareness;Cardiopulmonary status limiting activity       PT Treatment Interventions DME instruction;Gait training;Stair training;Functional mobility training;Therapeutic activities;Therapeutic exercise;Balance training;Neuromuscular re-education;Cognitive remediation;Patient/family education    PT Goals (Current goals can be found in the Care Plan section)  Acute Rehab PT Goals Patient Stated Goal: Return home PT Goal Formulation: With patient/family Time For Goal Achievement: 02/05/18 Potential to Achieve Goals: Fair    Frequency Min 4X/week   Barriers to discharge Decreased caregiver support      Co-evaluation               AM-PAC PT "6 Clicks" Daily Activity  Outcome Measure Difficulty turning over in bed (including adjusting bedclothes, sheets and blankets)?: Unable Difficulty moving from lying on back to sitting on the side of the bed? : Unable Difficulty sitting down on and standing up from a chair with arms (e.g., wheelchair, bedside commode, etc,.)?: Unable Help needed moving to and from a bed to chair (including a wheelchair)?: A Little Help needed walking in hospital room?: A Little Help needed climbing 3-5 steps with a railing? : A Lot 6 Click Score: 11    End of Session Equipment Utilized During Treatment: Gait belt;Oxygen Activity Tolerance: Patient limited by pain Patient left: in chair;with call bell/phone within reach;with chair alarm set;with family/visitor present Nurse Communication: Mobility status PT Visit Diagnosis: Other abnormalities of gait and mobility (R26.89)    Time: 5537-4827 PT Time Calculation (min) (ACUTE ONLY): 44 min   Charges:   PT Evaluation $PT Eval Moderate Complexity: 1 Mod PT Treatments $Therapeutic Activity: 8-22 mins $Self Care/Home Management: 8-22       Mabeline Caras, PT, DPT Acute Rehabilitation Services  Pager [phone] Office [phone]  Derry Lory 01/22/2018,  1:33 PM

## 2018-01-22 NOTE — Consult Note (Signed)
Physical Medicine and Rehabilitation Consult Reason for Consult: Decreased functional mobility Referring Physician: Internal medicine   HPI: Christian Maxwell is a 84 y.o. right-handed male with history of dementia maintained on Aricept, chronic atrial fibrillation maintained on Coumadin, colon cancer, hypertension.  Per chart review patient lives alone.  Reported to be independent.  Driving short distances.  Two-level home with bedroom upstairs.  His closest family is 1-1/2 hours away question assistance on discharge.  Presented 01/21/2018 with nonspecific chest pain, dizziness and generalized weakness.  CT/MRI showed 7 mm acute hemorrhage left thalamus.  No underlying ischemic infarction or mass lesion.  Echocardiogram with ejection fraction of 65% no wall motion abnormalities.  Troponin mildly elevated 0.06, INR 2.02.  Coumadin held due to bleed.  Cardiology follow-up for mildly elevated troponin nonspecific chest pain did not feel to be acute coronary syndrome and monitored.  Physical and Occupational Therapy evaluations pending.  MD has requested physical medicine rehab consult.   Review of Systems  Constitutional: Negative for chills and fever.  HENT: Negative for hearing loss.   Eyes: Negative for blurred vision and double vision.  Respiratory: Negative for cough and shortness of breath.   Cardiovascular: Positive for palpitations. Negative for chest pain and leg swelling.  Gastrointestinal: Positive for constipation. Negative for nausea.  Genitourinary: Positive for urgency. Negative for dysuria and hematuria.  Musculoskeletal: Positive for falls and myalgias.  Skin: Negative for rash.  Neurological: Positive for dizziness.  Psychiatric/Behavioral: Positive for memory loss.  All other systems reviewed and are negative.  Past Medical History:  Diagnosis Date  . Abscess 7/13   abd abscess  . Cancer [facility])    colon cancer  . Chronic atrial fibrillation ([place])   . History of  colon cancer 2000   T3N0 s/p colectomy  . History of echocardiogram    a. Echo 2/13:  Mild LVH, EF 55-60%, mild MR, moderate LAE, mild to moderate RAE;  b. Echo 1/17: mild LVH, vigorous LVF, EF 65-70%, no RWMA, trivial AI, trivial MR, severe LAE, mild RAE, mod TR, PASP 32 mmHg  . History of stress test    a. Cardiolite in 9/04: EF 67%, no ischemia.;  b. Myoview 6/16: no ischemia   . Hx of adenomatous colonic polyps   . Hyperlipidemia   . Hypertension   . METHICILLIN RESISTANT STAPHYLOCOCCUS AUREUS INFECTION 12/12/2006   Annotation: stitch abcess in lower abdomen after colon cancer resection Qualifier: Diagnosis of  By: [name] MD, [name]    . Myocardial infarction ([place]) 1985  . Postoperative stitch abscess 07/19/2011  . PVC's (premature ventricular contractions)   . Syncope and collapse    pt denies  . Thyroid disease    followed by [name]   Past Surgical History:  Procedure Laterality Date  . COLECTOMY  2000   6 inches removed  . COLONOSCOPY  2009   polyp, h/o cancer [name])  . EYE SURGERY     cataract surgery both eyes- pts ates he has never had cataract surgery 10-26-15  . HERNIA REPAIR  12/4534   DB umbilical hernia  . INCISE AND DRAIN ABCESS  2009,2010,2011,2012  . INCISION AND DRAINAGE Right 04/02/2013   Procedure: INCISION AND DRAINAGE RIGHT KNEE HEMATOMA;  Surgeon: [name], MD;  Location: [place];  Service: Orthopedics;  Laterality: Right;  . INCISION AND DRAINAGE ABSCESS N/A 09/20/2012   Procedure: INCISION AND DRAINAGE SUPRAPUBIC ABSCESS;  Surgeon: [name], MD;  Location: [place];  Service: General;  Laterality: N/A;  . INCISION AND DRAINAGE ABSCESS Left 02/28/2013   Procedure: INCISION AND DRAINAGE LEFT ARM ABSCESS;  Surgeon: [name], MD;  Location: [place];  Service: General;  Laterality: Left;  . LUMBAR LAMINECTOMY  1977  . MASS EXCISION  03/07/2012   EXCISION MASS;  Surgeon: [name], MD;  Laterality: Right;   removal mass posterior right arm  . POLYPECTOMY    . WOUND EXPLORATION  2006   For possible stitch abscess Dr [name]   Family History  Problem Relation Age of Onset  . Stroke Mother   . Stroke Father   . Hypertension Father   . CAD Father 3       MI  . Dementia Father   . Stroke Sister 80  . Stroke Brother   . Stroke Brother   . Diabetes Brother   . CAD Brother 48       MI  . Dementia Sister 43  . Dementia Brother   . Colon cancer Cousin        x4 total with colon cancer  . Cancer Neg Hx   . Colon polyps Neg Hx   . Rectal cancer Neg Hx   . Stomach cancer Neg Hx    Social History:  reports that he quit smoking about 40 years ago. He quit smokeless tobacco use about 40 years ago.  His smokeless tobacco use included chew. He reports that he does not drink alcohol or use drugs. Allergies: No Known Allergies Medications Prior to Admission  Medication Sig Dispense Refill  . Artificial Tear Ointment (DRY EYES OP) Apply 1 drop to eye as needed (dry eyes).    [name] aspirin 81 MG tablet Take 1 tablet (81 mg total) by mouth daily. 30 tablet   . donepezil (ARICEPT) 10 MG tablet Take 10 mg by mouth at bedtime.    . fish oil-omega-3 fatty acids 1000 MG capsule Take 1 g by mouth daily.     . folic acid (FOLVITE) 1 MG tablet Take 1 mg by mouth daily.    . metoprolol tartrate (LOPRESSOR) 25 MG tablet TAKE 1 TABLET BY MOUTH TWICE DAILY (Patient taking differently: Take 25 mg by mouth 2 (two) times daily. ) 60 tablet 8  . nitroGLYCERIN (NITROSTAT) 0.4 MG SL tablet Place 1 tablet (0.4 mg total) under the tongue every 5 (five) minutes as needed for chest pain. 25 tablet 6  . simvastatin (ZOCOR) 40 MG tablet Take 1 tablet (40 mg total) by mouth every morning. Patient needs to call and schedule an appointment for further refills 1st attempt 30 tablet 0  . Thiamine HCl (VITAMIN B-1 PO) Take 1 tablet by mouth daily.     [name] warfarin (COUMADIN) 5 MG tablet USE AS DIRECTED BY COUMADIN CLINIC (Patient  taking differently: Take 2.5 mg by mouth See admin instructions. Take 5mg  Tuesday-Sunday and 2.5mg  every monday) 30 tablet 3  . zolpidem (AMBIEN) 10 MG tablet Take 1 tablet (10 mg total) by mouth at bedtime as needed for sleep.    . mupirocin cream (BACTROBAN) 2 % Apply 1 application topically 2 (two) times daily. (Patient not taking: Reported on 01/20/2018) 30 g 1  . tizanidine (ZANAFLEX) 2 MG capsule Take 1 capsule (2 mg total) by mouth at bedtime as needed for muscle spasms. (Patient not taking: Reported on 01/20/2018) 30 capsule 2    Home: [place] expects to be discharged to:: Private residence Living Arrangements: Alone Available Help at Discharge:  Family, Available PRN/intermittently Type of Home: House Home Access: Stairs to enter [employer] of Steps: 5 Entrance Stairs-Rails: Right, Left Home Layout: Two level, Able to live on main level with bedroom/bathroom Home Equipment: Environmental consultant - 2 wheels  Lives With: Alone  Functional History: Prior Function Level of Independence: Independent Comments: Per daughter, pt recently shared with family regarding falls at home. Pt reports 2 falls in the past two months Functional Status:  Mobility: Bed Mobility Overal bed mobility: Needs Assistance Bed Mobility: Supine to Sit Supine to sit: Mod assist, HOB elevated General bed mobility comments: Pt insistent HOB be elevated and not willing attempt bed mobility without assist. ModA for trunk elevation. C/o severe chest pain upon sitting; RN notified and present Transfers Overall transfer level: Needs assistance Equipment used: Rolling walker (2 wheeled) Transfers: Sit to/from Stand Sit to Stand: Min assist, From elevated surface General transfer comment: Pt able to stand on third attempt with minA for trunk elevation; repeated cues for correct hand placement as pt repeatedly attempting to pull on RW Ambulation/Gait Ambulation/Gait assistance: Min guard Gait Distance  (Feet): 15 Feet Assistive device: Rolling walker (2 wheeled) Gait Pattern/deviations: Step-through pattern, Decreased stride length, Trunk flexed General Gait Details: Min guard for balance, pt with poor safety awareness. Distance limited secondary to active chest pain; VSS Gait velocity: Decreased Gait velocity interpretation: <1.8 ft/sec, indicate of risk for recurrent falls    ADL:    Cognition: Cognition Overall Cognitive Status: Impaired/Different from baseline Arousal/Alertness: Awake/alert Orientation Level: Oriented X4 Memory: Appears intact Awareness: Appears intact Problem Solving: Appears intact Safety/Judgment: Appears intact Cognition Arousal/Alertness: Awake/alert Behavior During Therapy: Flat affect Overall Cognitive Status: Impaired/Different from baseline Area of Impairment: Attention, Memory, Following commands, Safety/judgement, Awareness, Problem solving Current Attention Level: Sustained Memory: Decreased short-term memory Following Commands: Follows one step commands inconsistently Safety/Judgement: Decreased awareness of safety, Decreased awareness of deficits Awareness: Emergent Problem Solving: Slow processing, Difficulty sequencing, Requires verbal cues General Comments: Daughter present and reports cognition worse than baseline, but difficult to determine if cognition worsening PTA or all new onset since hemorrhage. Very poor safety awareness throughout session. Seems to be masking symptoms. Potential h/o dementia  Blood pressure 107/73, pulse 75, temperature 97.8 F (36.6 C), temperature source Oral, resp. rate 18, height 5\' 9"  (1.753 m), weight 78.5 kg, SpO2 95 %. Physical Exam  Vitals reviewed. HENT:  Head: Normocephalic.  Eyes: EOM are normal.  Neck: Normal range of motion. Neck supple. No thyromegaly present.  Cardiovascular:  Cardiac rate controlled  Respiratory: Effort normal and breath sounds normal. No respiratory distress.  GI: Soft.  Bowel sounds are normal. He exhibits no distension.  Musculoskeletal:  Left shoulder limited by pain.  Neurological: He is alert.  Patient sitting up in chair.  Makes good eye contact with examiner.  Follows simple commands.  He can answer simple question regards to person, age and date of birth.  Has some difficulty with geographical information and limited medical historian.  Skin: Skin is warm and dry.    Results for orders placed or performed during the hospital encounter of 01/20/18 (from the past 24 hour(s))  Protime-INR     Status: Abnormal   Collection Time: 01/22/18  6:42 AM  Result Value Ref Range   Prothrombin Time 17.8 (H) 11.4 - 15.2 seconds   INR 1.48   Lipid panel     Status: Abnormal   Collection Time: 01/22/18  6:42 AM  Result Value Ref Range   Cholesterol 216 (H) 0 -  200 mg/dL   Triglycerides 138 <150 mg/dL   HDL 45 >40 mg/dL   Total CHOL/HDL Ratio 4.8 RATIO   VLDL 28 0 - 40 mg/dL   LDL Cholesterol 143 (H) 0 - 99 mg/dL  Hemoglobin A1c     Status: Abnormal   Collection Time: 01/22/18  6:42 AM  Result Value Ref Range   Hgb A1c MFr Bld 5.9 (H) 4.8 - 5.6 %   Mean Plasma Glucose 122.63 mg/dL  Glucose, capillary     Status: Abnormal   Collection Time: 01/22/18 10:27 AM  Result Value Ref Range   Glucose-Capillary 166 (H) 70 - 99 mg/dL   Dg Wrist Complete Left  Result Date: 01/21/2018 CLINICAL DATA:  Pain in the dorsal radial side of the wrist for 6 months EXAM: LEFT WRIST - COMPLETE 3+ VIEW COMPARISON:  None. FINDINGS: No acute displaced fracture or malalignment. Moderate arthritis at the first [facility] joint. Mild arthritis at the distal radiocarpal joint. Small cysts in the scaphoid bone. IMPRESSION: 1. No acute osseous abnormality. 2. Arthritis at the first [facility] joint and distal radial carpal joint Electronically Signed   By: Donavan Foil M.D.   On: 01/21/2018 14:46   Ct Head Wo Contrast  Result Date: 01/22/2018 CLINICAL DATA:  Hemorrhagic stroke follow-up. EXAM: CT  HEAD WITHOUT CONTRAST TECHNIQUE: Contiguous axial images were obtained from the base of the skull through the vertex without intravenous contrast. COMPARISON:  CT head from yesterday. FINDINGS: Brain: Unchanged 7-8 mm hemorrhage in the left thalamus. No new hemorrhage. No significant mass effect. Minimal surrounding edema is unchanged. No hydrocephalus or extra-axial collection. Stable mild cerebral atrophy and chronic microvascular ischemic changes. Vascular: Atherosclerotic vascular calcification of the carotid siphons. No hyperdense vessel. Skull: Negative for fracture or focal lesion. Sinuses/Orbits: No acute finding. Other: None. IMPRESSION: 1. Unchanged small 7-8 mm hemorrhage in the left thalamus. No new hemorrhage or progressive mass effect. Electronically Signed   By: Titus Dubin M.D.   On: 01/22/2018 08:06   Ct Head Wo Contrast  Result Date: 01/21/2018 CLINICAL DATA:  Neurologic change. EXAM: CT HEAD WITHOUT CONTRAST TECHNIQUE: Contiguous axial images were obtained from the base of the skull through the vertex without intravenous contrast. COMPARISON:  None. FINDINGS: Brain: Small hemorrhage in the left thalamus. No evidence of significant mass effect. Mild brain parenchymal volume loss and periventricular microangiopathy. Vascular: Calcific atherosclerotic disease at the skull base. Skull: Normal. Negative for fracture or focal lesion. Sinuses/Orbits: No acute finding. Other: None. IMPRESSION: 7 mm hemorrhage in the left thalamus. No evidence of mass effect. Moderate brain parenchymal volume loss and microangiopathy. These results were called by telephone at the time of interpretation on 01/21/2018 at 11:13 am to Dr. Evangeline Gula, who verbally acknowledged these results. Electronically Signed   By: Fidela Salisbury M.D.   On: 01/21/2018 11:17   Mr Jeri Cos DJ Contrast  Result Date: 01/22/2018 CLINICAL DATA:  Cerebral hemorrhage EXAM: MRI HEAD WITHOUT AND WITH CONTRAST TECHNIQUE: Multiplanar,  multiecho pulse sequences of the brain and surrounding structures were obtained without and with intravenous contrast. CONTRAST:  6 mL Gadovist IV COMPARISON:  CT head 01/22/2018 FINDINGS: Brain: Left thalamic hemorrhage 7 mm unchanged from CT. No other areas of hemorrhage. Negative for acute infarct. Negative for mass lesion. No enhancing mass in the left thalamus. Marked temporal lobe atrophy involving the hippocampus. Question dementia. Mild chronic microvascular ischemic change in the white matter. Normal enhancement postcontrast administration. Vascular: Normal arterial flow void Skull and upper cervical spine:  Negative Sinuses/Orbits: Mild mucosal edema paranasal sinuses. Bilateral cataract surgery Other: None IMPRESSION: 7 mm acute hemorrhage left thalamus unchanged from CT. No underlying ischemic infarction or mass lesion. Cerebral atrophy most prominent the temporal lobes. Marked hippocampal atrophy bilaterally. Question dementia. Mild chronic microvascular ischemia in the white matter. Electronically Signed   By: Franchot Gallo M.D.   On: 01/22/2018 10:22     Assessment/Plan: Diagnosis: left thalamic hemorrhage, hx of dementia, gait disorder 1. Does the need for close, 24 hr/day medical supervision in concert with the patient's rehab needs make it unreasonable for this patient to be served in a less intensive setting? Yes 2. Co-Morbidities requiring supervision/potential complications: afib, pain mgt, HTN, insomnia/sleep disorder 3. Due to bladder management, bowel management, safety, skin/wound care, disease management, medication administration, pain management and patient education, does the patient require 24 hr/day rehab nursing? Yes 4. Does the patient require coordinated care of a physician, rehab nurse, PT (1-2 hrs/day, 5 days/week), OT (1-2 hrs/day, 5 days/week) and SLP (1-2 hrs/day, 5 days/week) to address physical and functional deficits in the context of the above medical  diagnosis(es)? Yes and Potentially Addressing deficits in the following areas: balance, endurance, locomotion, strength, transferring, bowel/bladder control, bathing, dressing, feeding, grooming, toileting, cognition and psychosocial support 5. Can the patient actively participate in an intensive therapy program of at least 3 hrs of therapy per day at least 5 days per week? Yes 6. The potential for patient to make measurable gains while on inpatient rehab is excellent 7. Anticipated functional outcomes upon discharge from inpatient rehab are modified independent and supervision  with PT, modified independent and supervision with OT, modified independent and supervision with SLP. 8. Estimated rehab length of stay to reach the above functional goals is: potentially 7-10 days 9. Anticipated D/C setting: Home 10. Anticipated post D/C treatments: HH therapy and Outpatient therapy 11. Overall Rehab/Functional Prognosis: good  RECOMMENDATIONS: This patient's condition is appropriate for continued rehabilitative care in the following setting: CIR Patient has agreed to participate in recommended program. Potentially Note that insurance prior authorization may be required for reimbursement for recommended care.  Comment: Pt will need supervision at home and likely long term living arrangements needs to be discussed. Rehab Admissions Coordinator to follow up.  Thanks,  Meredith Staggers, MD, Mellody Drown  I have personally performed a face to face diagnostic evaluation of this patient. Additionally, I have reviewed and concur with the physician assistant's documentation above.    Meredith Staggers, MD 01/22/2018

## 2018-01-22 NOTE — Progress Notes (Signed)
Progress Note  Patient Name: Christian Maxwell Date of Encounter: 01/22/2018  Primary Cardiologist: Mertie Moores, MD   Subjective   No chest pain.  Diagnosed with intracranial bleed.  COumadin stopped.   Inpatient Medications    Scheduled Meds: . donepezil  10 mg Oral QHS  . folic acid  1 mg Oral Daily  . furosemide  20 mg Intravenous Q12H  . Influenza vac split quadrivalent PF  0.5 mL Intramuscular Tomorrow-1000  . metoprolol tartrate  25 mg Oral BID  . omega-3 acid ethyl esters  1 g Oral Daily  . pantoprazole  40 mg Oral Daily  . senna-docusate  1 tablet Oral BID  . simvastatin  40 mg Oral q morning - 10a  . thiamine  100 mg Oral Daily   Continuous Infusions:  PRN Meds: acetaminophen, ALPRAZolam, labetalol, nitroGLYCERIN, ondansetron (ZOFRAN) IV   Vital Signs    Vitals:   01/21/18 2354 01/22/18 0546 01/22/18 0932 01/22/18 1226  BP: 130/75 125/75 (!) 139/94 107/73  Pulse: 81 86 82 75  Resp: (!) 27 (!) 22  18  Temp: 98.1 F (36.7 C)   97.8 F (36.6 C)  TempSrc: Oral   Oral  SpO2: 93% 95%  95%  Weight:  78.5 kg    Height:        Intake/Output Summary (Last 24 hours) at 01/22/2018 1259 Last data filed at 01/22/2018 1200 Gross per 24 hour  Intake 476 ml  Output 0 ml  Net 476 ml   Filed Weights   01/20/18 1839 01/21/18 0516 01/22/18 0546  Weight: 79 kg 79.9 kg 78.5 kg    Telemetry    AFib, variable rate control - Personally Reviewed  ECG     AFib, LBBB- Personally Reviewed  Physical Exam   GEN: No acute distress.   Neck: No JVD Cardiac: irregularly irregular, no murmurs, rubs, or gallops.  Respiratory: Clear to auscultation bilaterally. GI: Soft, nontender, non-distended  MS: No edema; No deformity. Neuro:  Nonfocal ; walking with walker and assistance Psych: Normal affect   Labs    Chemistry Recent Labs  Lab 01/20/18 1445  NA 137  K 3.7  CL 103  CO2 23  GLUCOSE 149*  BUN 18  CREATININE 0.93  CALCIUM 9.7  GFRNONAA >60  GFRAA >60    ANIONGAP 11     Hematology Recent Labs  Lab 01/20/18 1445  WBC 14.0*  RBC 5.50  HGB 15.5  HCT 48.7  MCV 88.5  MCH 28.2  MCHC 31.8  RDW 13.1  PLT 173    Cardiac Enzymes Recent Labs  Lab 01/20/18 1955 01/20/18 2304 01/21/18 0157  TROPONINI 0.06* 0.05* 0.04*    Recent Labs  Lab 01/20/18 1453  TROPIPOC 0.09*     BNP Recent Labs  Lab 01/20/18 1445 01/21/18 0157  BNP 299.1* 190.4*     DDimer No results for input(s): DDIMER in the last 168 hours.   Radiology    Dg Chest 2 View  Result Date: 01/20/2018 CLINICAL DATA:  Left chest pressure and shortness of breath EXAM: CHEST - 2 VIEW COMPARISON:  04/03/2016 FINDINGS: Mild enlargement of the cardiopericardial silhouette. Tortuous thoracic aorta. Atherosclerotic calcification of the aortic arch. Low lung volumes are present, causing crowding of the pulmonary vasculature. Bibasilar linear opacities favoring mild atelectasis. Hazy density along the lower lobes primarily on the lateral projection is partially due to crowding of pulmonary vasculature but a component of ground-glass density or interstitial accentuation is difficult to exclude based  on the lateral projection, and is primarily retro diaphragmatic. IMPRESSION: 1. Indistinct lower lobe opacities best seen on the lateral projection, potentially from alveolitis, low-grade edema, or atypical infectious process. There is also some mild bibasilar atelectasis. 2. Mild enlargement of the cardiopericardial silhouette. 3.  Aortic Atherosclerosis (ICD10-I70.0). 4. Low lung volumes are present, causing crowding of the pulmonary vasculature. Electronically Signed   By: Van Clines M.D.   On: 01/20/2018 15:01   Dg Wrist Complete Left  Result Date: 01/21/2018 CLINICAL DATA:  Pain in the dorsal radial side of the wrist for 6 months EXAM: LEFT WRIST - COMPLETE 3+ VIEW COMPARISON:  None. FINDINGS: No acute displaced fracture or malalignment. Moderate arthritis at the first [facility]  joint. Mild arthritis at the distal radiocarpal joint. Small cysts in the scaphoid bone. IMPRESSION: 1. No acute osseous abnormality. 2. Arthritis at the first [facility] joint and distal radial carpal joint Electronically Signed   By: Donavan Foil M.D.   On: 01/21/2018 14:46   Ct Head Wo Contrast  Result Date: 01/22/2018 CLINICAL DATA:  Hemorrhagic stroke follow-up. EXAM: CT HEAD WITHOUT CONTRAST TECHNIQUE: Contiguous axial images were obtained from the base of the skull through the vertex without intravenous contrast. COMPARISON:  CT head from yesterday. FINDINGS: Brain: Unchanged 7-8 mm hemorrhage in the left thalamus. No new hemorrhage. No significant mass effect. Minimal surrounding edema is unchanged. No hydrocephalus or extra-axial collection. Stable mild cerebral atrophy and chronic microvascular ischemic changes. Vascular: Atherosclerotic vascular calcification of the carotid siphons. No hyperdense vessel. Skull: Negative for fracture or focal lesion. Sinuses/Orbits: No acute finding. Other: None. IMPRESSION: 1. Unchanged small 7-8 mm hemorrhage in the left thalamus. No new hemorrhage or progressive mass effect. Electronically Signed   By: Titus Dubin M.D.   On: 01/22/2018 08:06   Ct Head Wo Contrast  Result Date: 01/21/2018 CLINICAL DATA:  Neurologic change. EXAM: CT HEAD WITHOUT CONTRAST TECHNIQUE: Contiguous axial images were obtained from the base of the skull through the vertex without intravenous contrast. COMPARISON:  None. FINDINGS: Brain: Small hemorrhage in the left thalamus. No evidence of significant mass effect. Mild brain parenchymal volume loss and periventricular microangiopathy. Vascular: Calcific atherosclerotic disease at the skull base. Skull: Normal. Negative for fracture or focal lesion. Sinuses/Orbits: No acute finding. Other: None. IMPRESSION: 7 mm hemorrhage in the left thalamus. No evidence of mass effect. Moderate brain parenchymal volume loss and microangiopathy. These  results were called by telephone at the time of interpretation on 01/21/2018 at 11:13 am to Dr. Evangeline Gula, who verbally acknowledged these results. Electronically Signed   By: Fidela Salisbury M.D.   On: 01/21/2018 11:17   Ct Angio Chest Pe W And/or Wo Contrast  Result Date: 01/20/2018 CLINICAL DATA:  Acute onset chest pressure this morning with shortness of breath. Taking Coumadin for atrial fibrillation. EXAM: CT ANGIOGRAPHY CHEST WITH CONTRAST TECHNIQUE: Multidetector CT imaging of the chest was performed using the standard protocol during bolus administration of intravenous contrast. Multiplanar CT image reconstructions and MIPs were obtained to evaluate the vascular anatomy. CONTRAST:  182mL ISOVUE-370 IOPAMIDOL (ISOVUE-370) INJECTION 76% COMPARISON:  Chest radiographs obtained today. Chest CT dated 08/24/2004. Chest radiographs dated 04/03/2016. FINDINGS: Cardiovascular: Mild atheromatous arterial calcifications, including coronary artery and aortic calcifications. Enlarged heart. Normally opacified pulmonary arteries with no pulmonary arterial filling defects seen. Mediastinum/Nodes: No enlarged mediastinal, hilar, or axillary lymph nodes. Thyroid gland, trachea, and esophagus demonstrate no significant findings. Lungs/Pleura: Mild bilateral dependent atelectasis, corresponding to the densities seen on the radiographs earlier  today. Upper Abdomen: Partially included gallbladder on the last images, filled with multiple small stones, measuring up to 3 mm in diameter each. No visible gallbladder wall thickening or pericholecystic fluid. Musculoskeletal: Old, healed right posterior rib fractures. No acute fracture seen. Approximately 10% T1 vertebral superior endplate compression deformity with minimal bony retropulsion, sclerosis and no acute fracture lines. Approximately 40% T4 vertebral compression deformity with no bony retropulsion and no acute fracture lines. The T4 vertebral body compression deformity is  unchanged since 04/03/2016. The T1 level was obscured by the shoulders at that time. Review of the MIP images confirms the above findings. IMPRESSION: 1. No pulmonary emboli. 2. Mild bilateral dependent atelectasis. 3. Cholelithiasis. 4. Old T1 and T4 vertebral compression fractures. 5. Mild calcific coronary artery and aortic atherosclerosis. Aortic Atherosclerosis (ICD10-I70.0). Electronically Signed   By: Claudie Revering M.D.   On: 01/20/2018 16:24   Mr Jeri Cos DZ Contrast  Result Date: 01/22/2018 CLINICAL DATA:  Cerebral hemorrhage EXAM: MRI HEAD WITHOUT AND WITH CONTRAST TECHNIQUE: Multiplanar, multiecho pulse sequences of the brain and surrounding structures were obtained without and with intravenous contrast. CONTRAST:  6 mL Gadovist IV COMPARISON:  CT head 01/22/2018 FINDINGS: Brain: Left thalamic hemorrhage 7 mm unchanged from CT. No other areas of hemorrhage. Negative for acute infarct. Negative for mass lesion. No enhancing mass in the left thalamus. Marked temporal lobe atrophy involving the hippocampus. Question dementia. Mild chronic microvascular ischemic change in the white matter. Normal enhancement postcontrast administration. Vascular: Normal arterial flow void Skull and upper cervical spine: Negative Sinuses/Orbits: Mild mucosal edema paranasal sinuses. Bilateral cataract surgery Other: None IMPRESSION: 7 mm acute hemorrhage left thalamus unchanged from CT. No underlying ischemic infarction or mass lesion. Cerebral atrophy most prominent the temporal lobes. Marked hippocampal atrophy bilaterally. Question dementia. Mild chronic microvascular ischemia in the white matter. Electronically Signed   By: Franchot Gallo M.D.   On: 01/22/2018 10:22    Cardiac Studies   Echo: - Left ventricle: The cavity size was normal. There was mild   concentric hypertrophy. Systolic function was normal. The   estimated ejection fraction was in the range of 60% to 65%. Wall   motion was normal; there were no  regional wall motion   abnormalities. - Ventricular septum: Septal motion showed abnormal function and   dyssynchrony consistent with bundle branch block. - Aortic valve: Transvalvular velocity was within the normal range.   There was no stenosis. There was trivial regurgitation. - Mitral valve: Transvalvular velocity was within the normal range.   There was no evidence for stenosis. There was no regurgitation. - Left atrium: The atrium was severely dilated. - Right ventricle: The cavity size was normal. Wall thickness was   normal. Systolic function was normal. - Tricuspid valve: There was mild regurgitation. - Pulmonary arteries: Systolic pressure was within the normal   range. PA peak pressure: 23 mm Hg (S).  Patient Profile     84 y.o. male with AFib, ICH on anticoagulation  Assessment & Plan    1) AFib: Rate controlled.  Now off of Coumadin due to [place].  Will follow.   2) Would not pursue any eval for coronary ischemia at this time. Will have to minimize anticoagulation going forward.      For questions or updates, please contact [place] Please consult [email or web address] for contact info under        Signed, Larae Grooms, MD  01/22/2018, 12:59 PM

## 2018-01-22 NOTE — Progress Notes (Addendum)
STROKE TEAM PROGRESS NOTE   INTERVAL HISTORY His daughter and PT are at the bedside.  Overall, pt doing well. Will need OP PT.  Daughter reports he lives alone and family are starting to notice him having difficulties with his paying of bills, they also have reports of his poor driving ability. They are thinking about what they need to do next.   Vitals:   01/21/18 1744 01/21/18 1956 01/21/18 2354 01/22/18 0546  BP: 134/74 115/70 130/75 125/75  Pulse: 83 98 81 86  Resp: (!) 26 (!) 24 (!) 27 (!) 22  Temp:  98.4 F (36.9 C) 98.1 F (36.7 C)   TempSrc:  Oral Oral   SpO2: 97% 96% 93% 95%  Weight:    78.5 kg  Height:        CBC:  Recent Labs  Lab 01/20/18 1445  WBC 14.0*  NEUTROABS 10.8*  HGB 15.5  HCT 48.7  MCV 88.5  PLT 053    Basic Metabolic Panel:  Recent Labs  Lab 01/20/18 1445  NA 137  K 3.7  CL 103  CO2 23  GLUCOSE 149*  BUN 18  CREATININE 0.93  CALCIUM 9.7   Lipid Panel:     Component Value Date/Time   CHOL 216 (H) 01/22/2018 0642   CHOL 147 10/04/2016 0801   TRIG 138 01/22/2018 0642   HDL 45 01/22/2018 0642   HDL 41 10/04/2016 0801   CHOLHDL 4.8 01/22/2018 0642   VLDL 28 01/22/2018 0642   LDLCALC 143 (H) 01/22/2018 0642   LDLCALC 71 10/04/2016 0801   HgbA1c:  Lab Results  Component Value Date   HGBA1C 5.9 (H) 01/22/2018   Urine Drug Screen: No results found for: LABOPIA, COCAINSCRNUR, LABBENZ, AMPHETMU, THCU, LABBARB  Alcohol Level No results found for: [facility]  IMAGING Dg Chest 2 View  Result Date: 01/20/2018 CLINICAL DATA:  Left chest pressure and shortness of breath EXAM: CHEST - 2 VIEW COMPARISON:  04/03/2016 FINDINGS: Mild enlargement of the cardiopericardial silhouette. Tortuous thoracic aorta. Atherosclerotic calcification of the aortic arch. Low lung volumes are present, causing crowding of the pulmonary vasculature. Bibasilar linear opacities favoring mild atelectasis. Hazy density along the lower lobes primarily on the lateral projection  is partially due to crowding of pulmonary vasculature but a component of ground-glass density or interstitial accentuation is difficult to exclude based on the lateral projection, and is primarily retro diaphragmatic. IMPRESSION: 1. Indistinct lower lobe opacities best seen on the lateral projection, potentially from alveolitis, low-grade edema, or atypical infectious process. There is also some mild bibasilar atelectasis. 2. Mild enlargement of the cardiopericardial silhouette. 3.  Aortic Atherosclerosis (ICD10-I70.0). 4. Low lung volumes are present, causing crowding of the pulmonary vasculature. Electronically Signed   By: Van Clines M.D.   On: 01/20/2018 15:01   Dg Wrist Complete Left  Result Date: 01/21/2018 CLINICAL DATA:  Pain in the dorsal radial side of the wrist for 6 months EXAM: LEFT WRIST - COMPLETE 3+ VIEW COMPARISON:  None. FINDINGS: No acute displaced fracture or malalignment. Moderate arthritis at the first [facility] joint. Mild arthritis at the distal radiocarpal joint. Small cysts in the scaphoid bone. IMPRESSION: 1. No acute osseous abnormality. 2. Arthritis at the first [facility] joint and distal radial carpal joint Electronically Signed   By: Donavan Foil M.D.   On: 01/21/2018 14:46   Ct Head Wo Contrast  Result Date: 01/22/2018 CLINICAL DATA:  Hemorrhagic stroke follow-up. EXAM: CT HEAD WITHOUT CONTRAST TECHNIQUE: Contiguous axial images were obtained from  the base of the skull through the vertex without intravenous contrast. COMPARISON:  CT head from yesterday. FINDINGS: Brain: Unchanged 7-8 mm hemorrhage in the left thalamus. No new hemorrhage. No significant mass effect. Minimal surrounding edema is unchanged. No hydrocephalus or extra-axial collection. Stable mild cerebral atrophy and chronic microvascular ischemic changes. Vascular: Atherosclerotic vascular calcification of the carotid siphons. No hyperdense vessel. Skull: Negative for fracture or focal lesion. Sinuses/Orbits: No  acute finding. Other: None. IMPRESSION: 1. Unchanged small 7-8 mm hemorrhage in the left thalamus. No new hemorrhage or progressive mass effect. Electronically Signed   By: Titus Dubin M.D.   On: 01/22/2018 08:06   Ct Head Wo Contrast  Result Date: 01/21/2018 CLINICAL DATA:  Neurologic change. EXAM: CT HEAD WITHOUT CONTRAST TECHNIQUE: Contiguous axial images were obtained from the base of the skull through the vertex without intravenous contrast. COMPARISON:  None. FINDINGS: Brain: Small hemorrhage in the left thalamus. No evidence of significant mass effect. Mild brain parenchymal volume loss and periventricular microangiopathy. Vascular: Calcific atherosclerotic disease at the skull base. Skull: Normal. Negative for fracture or focal lesion. Sinuses/Orbits: No acute finding. Other: None. IMPRESSION: 7 mm hemorrhage in the left thalamus. No evidence of mass effect. Moderate brain parenchymal volume loss and microangiopathy. These results were called by telephone at the time of interpretation on 01/21/2018 at 11:13 am to Dr. Evangeline Gula, who verbally acknowledged these results. Electronically Signed   By: Fidela Salisbury M.D.   On: 01/21/2018 11:17   Ct Angio Chest Pe W And/or Wo Contrast  Result Date: 01/20/2018 CLINICAL DATA:  Acute onset chest pressure this morning with shortness of breath. Taking Coumadin for atrial fibrillation. EXAM: CT ANGIOGRAPHY CHEST WITH CONTRAST TECHNIQUE: Multidetector CT imaging of the chest was performed using the standard protocol during bolus administration of intravenous contrast. Multiplanar CT image reconstructions and MIPs were obtained to evaluate the vascular anatomy. CONTRAST:  173mL ISOVUE-370 IOPAMIDOL (ISOVUE-370) INJECTION 76% COMPARISON:  Chest radiographs obtained today. Chest CT dated 08/24/2004. Chest radiographs dated 04/03/2016. FINDINGS: Cardiovascular: Mild atheromatous arterial calcifications, including coronary artery and aortic calcifications. Enlarged  heart. Normally opacified pulmonary arteries with no pulmonary arterial filling defects seen. Mediastinum/Nodes: No enlarged mediastinal, hilar, or axillary lymph nodes. Thyroid gland, trachea, and esophagus demonstrate no significant findings. Lungs/Pleura: Mild bilateral dependent atelectasis, corresponding to the densities seen on the radiographs earlier today. Upper Abdomen: Partially included gallbladder on the last images, filled with multiple small stones, measuring up to 3 mm in diameter each. No visible gallbladder wall thickening or pericholecystic fluid. Musculoskeletal: Old, healed right posterior rib fractures. No acute fracture seen. Approximately 10% T1 vertebral superior endplate compression deformity with minimal bony retropulsion, sclerosis and no acute fracture lines. Approximately 40% T4 vertebral compression deformity with no bony retropulsion and no acute fracture lines. The T4 vertebral body compression deformity is unchanged since 04/03/2016. The T1 level was obscured by the shoulders at that time. Review of the MIP images confirms the above findings. IMPRESSION: 1. No pulmonary emboli. 2. Mild bilateral dependent atelectasis. 3. Cholelithiasis. 4. Old T1 and T4 vertebral compression fractures. 5. Mild calcific coronary artery and aortic atherosclerosis. Aortic Atherosclerosis (ICD10-I70.0). Electronically Signed   By: Claudie Revering M.D.   On: 01/20/2018 16:24   2D Echocardiogram  - Left ventricle: The cavity size was normal. There was mild concentric hypertrophy. Systolic function was normal. The estimated ejection fraction was in the range of 60% to 65%. Wall motion was normal; there were no regional wall motion abnormalities. - Ventricular  septum: Septal motion showed abnormal function and dyssynchrony consistent with bundle branch block. - Aortic valve: Transvalvular velocity was within the normal range. There was no stenosis. There was trivial regurgitation. - Mitral valve:  Transvalvular velocity was within the normal range. There was no evidence for stenosis. There was no regurgitation. - Left atrium: The atrium was severely dilated. - Right ventricle: The cavity size was normal. Wall thickness was normal. Systolic function was normal. - Tricuspid valve: There was mild regurgitation. - Pulmonary arteries: Systolic pressure was within the normal range. PA peak pressure: 23 mm Hg (S).  Carotid Doppler   There is 1-39% bilateral ICA stenosis. Vertebral artery flow is antegrade.     PHYSICAL EXAM GENERAL: Awake, alert in NAD HEENT:  Normocephalic and atraumatic, dry mm,  Ext: warm, well perfused, intact peripheral pulses, no edema NEURO:  Mental Status: Is alert, oriented to hospital, and why was initial brought to the hospital., speech is clear.  Naming, repetition, fluency, and comprehension intact. Cranial Nerves:  EOMI, visual fields full, slight right nasolabial fold decrease, facial sensation intact, hearing intact, tongue/uvula/soft palate midline, normal Motor: general weakness. Right arm, bilateral legs, left bicep and tricep show 4+/5 strength.  Patient's left shoulder is a 4/5 and patient's left grip is weak secondary to pain.  Tone: is normal and bulk is normal Sensation:  Intact to light touch bilaterally, with no neglect Coordination: FTN intact bilaterally No significant change in exam since yesterday   ASSESSMENT/PLAN Mr. KEELYN FJELSTAD is a 84 y.o. male with history of AF on warfarin, HTN, HLD, MI colon cancer, thyroid disease who presented with CP who was found to have dizziness, R hand clumsiness and difficulty walking during hospitalization.   Stroke:  left thalamic ICH infarct secondary to small vessel disease vs coumadin associated coagulopathy source  CT head 3mm L thalamic hmg. Small vessel disease. Atrophy.   Repeat CT head unchanged L thalamic hmg  MRI  65mm L thalamic hmg. Small vessel disease. Atrophy most prominent in the  temporal lobes.   Carotid Doppler  B ICA 1-39% stenosis, VAs antegrade   2D Echo  EF 60-65%. No source of embolus   LDL 143  HgbA1c 5.9  SCDs ordered for VTE prophylaxis  warfarin daily prior to admission - INR was 2.02 ->1.48, now on No antithrombotic given hmg. (see AF section below)  Therapy recommendations:  CIR. Consult in place  Disposition:  Pending  Will sign off  Follow up [facility] in 4 weeks. Order placed  Atrial Fibrillation  Home anticoagulation:  warfarin daily   INR 2.02->1.48  . No AC at this time . Repeat CT head in 2 weeks. If hmg resolve, may resume anticoagulation. Consider DOAC for decreased bleeding risk. Recommend to involve Dr. Acie Fredrickson (notified [facility] of need for CT in 2 weeks, if pt d/c to CIR, they can do prior to d/c home)  Hypertension  Stable . SBP goal < 140 given acute hemorrhage  Hyperlipidemia  Home meds:  zocor 40 and fish oil, resumed in hospital  LDL 143, goal < 70  Recommend increase in statin dose   Continue statin at discharge  Other Stroke Risk Factors  Advanced age  Family hx stroke (mother, father, brother, sister)  Coronary artery disease - MI  Other Active Problems  Baseline cognitive deficits  Hx colon cancer  Thyroid disease  Hospital day # 1  Burnetta Sabin, MSN, APRN, ANVP-BC, AGPCNP-BC Advanced Practice Stroke Nurse [place] for  Schedule & Pager information 01/22/2018 3:32 PM   ATTENDING NOTE: I reviewed above note and agree with the assessment and plan. Pt was seen and examined.   84 year old male with history of atrial fibrillation on Coumadin, hypertension, hyperlipidemia, MI in 1983, colon cancer status post surgery admitted for chest pain, dizziness, right hand dexterity difficulty and walking difficulty.  CT showed small left thalamic ICH.  Repeat CT stable ICH.  MRI with and without contrast confirmed left thalamic small ICH, no CAA.  EF 60 to 65%.  Carotid Doppler negative.   MRA was not performed as it will not change management.  INR yesterday 2.16->2.02-> 1.48.  LDL 143 and A1c 5.9.  Continue hold off aspirin and Coumadin due to [place].  Recommend to repeat CT in 2 weeks, if ICH resolves, anticoagulation and antiplatelet can be resumed.  I also discussed with patient and the his daughter to discuss with Dr. Acie Fredrickson to consider DOACs which may have lower risk of ICH than Coumadin.  Patient daughter expressed understanding of the plan. BP goal < 160.   Neurology will sign off. Please call with questions. Pt will follow up with stroke clinic NP at [facility] in about 4 weeks. Thanks for the consult.   Rosalin Hawking, MD PhD Stroke Neurology 01/22/2018 3:45 PM     To contact Stroke Continuity provider, please refer to [email or web address] After hours, contact General Neurology

## 2018-01-22 NOTE — Progress Notes (Signed)
Christian Maxwell  NWG:[id number] DOB: March 08, 1933 DOA: 01/20/2018 PCP: Ria Bush, MD    Brief Narrative:   84 yo with history of chronic chest pain and atrial fibrillation on warfarin admitted for complaints of chest pain. Family reveals several day hx ataxia and generalized weakness and difficulty feeding self. Evaluation reveals elevated troponin, hypoxia. Family shares that pt having ataxia, frequent falls and difficulty feeding self of late.  Evaluation reveals 7 mm intracranial hemorrhage left thalamus.  Neurology consultation very much appreciated and has requested follow-up CT scan and MRI today which have been completed and show no change.  Bedrest is discontinued PT and OT can see the patient.  Will likely require skilled facility prior to discharge back to home environment   Assessment & Plan:   Principal Problem:   Bleeding in brain [facility]) Active Problems:   Atrial fibrillation ([place])   Chest pain   Hypoxia   Dementia without behavioral disturbance   Memory impairment   Hearing impairment   Ataxia  1. Bleeding in the brain.  CT head with 25mm hemorrhage in the left thalamus. Family reports several day hx of generalized weakness, ataxia, frequent falls. Daughter states he can "no longer feed himself" Labetalol prn per neurology Stop coumadin SCD Repeat CT scan obtained today as well as MRI show no increase in bleeding.  Blood pressures have been well controlled.  Discontinued bedrest and consulted PT and OT.  2.  Chronic chest pain: Patient with chronic history of chest pain his dementia contributes to his forgetting that he has it.  Cardiology has seen the patient feels he is at baseline.  We will continue with supportive care.   3. Atrial fibrillation: Rate controlled Monitor Off warfarin.  3. Dyspnea/reported Hypoxia in ED: Resolved Continue IV lasix 20 mg VI every 12 hours Monitor closely Wean oxygen as able  4. Frequent  falls/ataxia. Likely related to above. CT head as noted above Appreciate neuro assistance  PT/OT evals in process  Code Status: full Family Communication: daughter at bedside Disposition Plan: to be determined based on PT OT evaluation   Consultants:  [facility]  cardiology   Subjective: Patient having episodes of confusion but without behavioral disturbance.  Objective: Vitals:   01/21/18 2354 01/22/18 0546 01/22/18 0932 01/22/18 1226  BP: 130/75 125/75 (!) 139/94 107/73  Pulse: 81 86 82 75  Resp: (!) 27 (!) 22  18  Temp: 98.1 F (36.7 C)   97.8 F (36.6 C)  TempSrc: Oral   Oral  SpO2: 93% 95%  95%  Weight:  78.5 kg    Height:        Intake/Output Summary (Last 24 hours) at 01/22/2018 1257 Last data filed at 01/22/2018 1200 Gross per 24 hour  Intake 476 ml  Output 0 ml  Net 476 ml   Filed Weights   01/20/18 1839 01/21/18 0516 01/22/18 0546  Weight: 79 kg 79.9 kg 78.5 kg    Examination:  General exam: Appears calm and comfortable  Respiratory system: Clear to auscultation. Respiratory effort normal. Cardiovascular system: S1 & S2 heard, RRR. No JVD, murmurs, rubs, gallops or clicks. No pedal edema. Gastrointestinal system: Abdomen is nondistended, soft and nontender. No organomegaly or masses felt. Normal bowel sounds heard. Central nervous system: Alert and oriented. No focal neurological deficits. Extremities: Symmetric 5 x 5 power. Skin: No rashes, lesions or ulcers Psychiatry: Judgement and insight appear normal. Mood & affect appropriate.     Data Reviewed: I  have personally reviewed following labs and imaging studies  CBC: Recent Labs  Lab 01/20/18 1445  WBC 14.0*  NEUTROABS 10.8*  HGB 15.5  HCT 48.7  MCV 88.5  PLT 440   Basic Metabolic Panel: Recent Labs  Lab 01/20/18 1445  NA 137  K 3.7  CL 103  CO2 23  GLUCOSE 149*  BUN 18  CREATININE 0.93  CALCIUM 9.7   GFR: Estimated Creatinine Clearance: 60.2 mL/min (by C-G formula  based on SCr of 0.93 mg/dL). Liver Function Tests: No results for input(s): AST, ALT, ALKPHOS, BILITOT, PROT, ALBUMIN in the last 168 hours. No results for input(s): LIPASE, AMYLASE in the last 168 hours. No results for input(s): AMMONIA in the last 168 hours. Coagulation Profile: Recent Labs  Lab 01/20/18 1617 01/21/18 0157 01/22/18 0642  INR 2.16 2.02 1.48   Cardiac Enzymes: Recent Labs  Lab 01/20/18 1955 01/20/18 2304 01/21/18 0157  TROPONINI 0.06* 0.05* 0.04*   BNP (last 3 results) No results for input(s): PROBNP in the last 8760 hours. HbA1C: Recent Labs    01/22/18 0642  HGBA1C 5.9*   CBG: Recent Labs  Lab 01/22/18 1027  GLUCAP 166*   Lipid Profile: Recent Labs    01/22/18 0642  CHOL 216*  HDL 45  LDLCALC 143*  TRIG 138  CHOLHDL 4.8   Thyroid Function Tests: No results for input(s): TSH, T4TOTAL, FREET4, T3FREE, THYROIDAB in the last 72 hours. Anemia Panel: No results for input(s): VITAMINB12, FOLATE, FERRITIN, TIBC, IRON, RETICCTPCT in the last 72 hours. Sepsis Labs: No results for input(s): PROCALCITON, LATICACIDVEN in the last 168 hours.  No results found for this or any previous visit (from the past 240 hour(s)).       Radiology Studies: Dg Chest 2 View  Result Date: 01/20/2018 CLINICAL DATA:  Left chest pressure and shortness of breath EXAM: CHEST - 2 VIEW COMPARISON:  04/03/2016 FINDINGS: Mild enlargement of the cardiopericardial silhouette. Tortuous thoracic aorta. Atherosclerotic calcification of the aortic arch. Low lung volumes are present, causing crowding of the pulmonary vasculature. Bibasilar linear opacities favoring mild atelectasis. Hazy density along the lower lobes primarily on the lateral projection is partially due to crowding of pulmonary vasculature but a component of ground-glass density or interstitial accentuation is difficult to exclude based on the lateral projection, and is primarily retro diaphragmatic. IMPRESSION: 1.  Indistinct lower lobe opacities best seen on the lateral projection, potentially from alveolitis, low-grade edema, or atypical infectious process. There is also some mild bibasilar atelectasis. 2. Mild enlargement of the cardiopericardial silhouette. 3.  Aortic Atherosclerosis (ICD10-I70.0). 4. Low lung volumes are present, causing crowding of the pulmonary vasculature. Electronically Signed   By: Van Clines M.D.   On: 01/20/2018 15:01   Dg Wrist Complete Left  Result Date: 01/21/2018 CLINICAL DATA:  Pain in the dorsal radial side of the wrist for 6 months EXAM: LEFT WRIST - COMPLETE 3+ VIEW COMPARISON:  None. FINDINGS: No acute displaced fracture or malalignment. Moderate arthritis at the first [facility] joint. Mild arthritis at the distal radiocarpal joint. Small cysts in the scaphoid bone. IMPRESSION: 1. No acute osseous abnormality. 2. Arthritis at the first [facility] joint and distal radial carpal joint Electronically Signed   By: Donavan Foil M.D.   On: 01/21/2018 14:46   Ct Head Wo Contrast  Result Date: 01/22/2018 CLINICAL DATA:  Hemorrhagic stroke follow-up. EXAM: CT HEAD WITHOUT CONTRAST TECHNIQUE: Contiguous axial images were obtained from the base of the skull through the vertex without intravenous contrast.  COMPARISON:  CT head from yesterday. FINDINGS: Brain: Unchanged 7-8 mm hemorrhage in the left thalamus. No new hemorrhage. No significant mass effect. Minimal surrounding edema is unchanged. No hydrocephalus or extra-axial collection. Stable mild cerebral atrophy and chronic microvascular ischemic changes. Vascular: Atherosclerotic vascular calcification of the carotid siphons. No hyperdense vessel. Skull: Negative for fracture or focal lesion. Sinuses/Orbits: No acute finding. Other: None. IMPRESSION: 1. Unchanged small 7-8 mm hemorrhage in the left thalamus. No new hemorrhage or progressive mass effect. Electronically Signed   By: Titus Dubin M.D.   On: 01/22/2018 08:06   Ct Head Wo  Contrast  Result Date: 01/21/2018 CLINICAL DATA:  Neurologic change. EXAM: CT HEAD WITHOUT CONTRAST TECHNIQUE: Contiguous axial images were obtained from the base of the skull through the vertex without intravenous contrast. COMPARISON:  None. FINDINGS: Brain: Small hemorrhage in the left thalamus. No evidence of significant mass effect. Mild brain parenchymal volume loss and periventricular microangiopathy. Vascular: Calcific atherosclerotic disease at the skull base. Skull: Normal. Negative for fracture or focal lesion. Sinuses/Orbits: No acute finding. Other: None. IMPRESSION: 7 mm hemorrhage in the left thalamus. No evidence of mass effect. Moderate brain parenchymal volume loss and microangiopathy. These results were called by telephone at the time of interpretation on 01/21/2018 at 11:13 am to Dr. Evangeline Gula, who verbally acknowledged these results. Electronically Signed   By: Fidela Salisbury M.D.   On: 01/21/2018 11:17   Ct Angio Chest Pe W And/or Wo Contrast  Result Date: 01/20/2018 CLINICAL DATA:  Acute onset chest pressure this morning with shortness of breath. Taking Coumadin for atrial fibrillation. EXAM: CT ANGIOGRAPHY CHEST WITH CONTRAST TECHNIQUE: Multidetector CT imaging of the chest was performed using the standard protocol during bolus administration of intravenous contrast. Multiplanar CT image reconstructions and MIPs were obtained to evaluate the vascular anatomy. CONTRAST:  174mL ISOVUE-370 IOPAMIDOL (ISOVUE-370) INJECTION 76% COMPARISON:  Chest radiographs obtained today. Chest CT dated 08/24/2004. Chest radiographs dated 04/03/2016. FINDINGS: Cardiovascular: Mild atheromatous arterial calcifications, including coronary artery and aortic calcifications. Enlarged heart. Normally opacified pulmonary arteries with no pulmonary arterial filling defects seen. Mediastinum/Nodes: No enlarged mediastinal, hilar, or axillary lymph nodes. Thyroid gland, trachea, and esophagus demonstrate no  significant findings. Lungs/Pleura: Mild bilateral dependent atelectasis, corresponding to the densities seen on the radiographs earlier today. Upper Abdomen: Partially included gallbladder on the last images, filled with multiple small stones, measuring up to 3 mm in diameter each. No visible gallbladder wall thickening or pericholecystic fluid. Musculoskeletal: Old, healed right posterior rib fractures. No acute fracture seen. Approximately 10% T1 vertebral superior endplate compression deformity with minimal bony retropulsion, sclerosis and no acute fracture lines. Approximately 40% T4 vertebral compression deformity with no bony retropulsion and no acute fracture lines. The T4 vertebral body compression deformity is unchanged since 04/03/2016. The T1 level was obscured by the shoulders at that time. Review of the MIP images confirms the above findings. IMPRESSION: 1. No pulmonary emboli. 2. Mild bilateral dependent atelectasis. 3. Cholelithiasis. 4. Old T1 and T4 vertebral compression fractures. 5. Mild calcific coronary artery and aortic atherosclerosis. Aortic Atherosclerosis (ICD10-I70.0). Electronically Signed   By: Claudie Revering M.D.   On: 01/20/2018 16:24   Mr Jeri Cos VO Contrast  Result Date: 01/22/2018 CLINICAL DATA:  Cerebral hemorrhage EXAM: MRI HEAD WITHOUT AND WITH CONTRAST TECHNIQUE: Multiplanar, multiecho pulse sequences of the brain and surrounding structures were obtained without and with intravenous contrast. CONTRAST:  6 mL Gadovist IV COMPARISON:  CT head 01/22/2018 FINDINGS: Brain: Left thalamic hemorrhage 7  mm unchanged from CT. No other areas of hemorrhage. Negative for acute infarct. Negative for mass lesion. No enhancing mass in the left thalamus. Marked temporal lobe atrophy involving the hippocampus. Question dementia. Mild chronic microvascular ischemic change in the white matter. Normal enhancement postcontrast administration. Vascular: Normal arterial flow void Skull and upper  cervical spine: Negative Sinuses/Orbits: Mild mucosal edema paranasal sinuses. Bilateral cataract surgery Other: None IMPRESSION: 7 mm acute hemorrhage left thalamus unchanged from CT. No underlying ischemic infarction or mass lesion. Cerebral atrophy most prominent the temporal lobes. Marked hippocampal atrophy bilaterally. Question dementia. Mild chronic microvascular ischemia in the white matter. Electronically Signed   By: Franchot Gallo M.D.   On: 01/22/2018 10:22        Scheduled Meds: . donepezil  10 mg Oral QHS  . folic acid  1 mg Oral Daily  . furosemide  20 mg Intravenous Q12H  . Influenza vac split quadrivalent PF  0.5 mL Intramuscular Tomorrow-1000  . metoprolol tartrate  25 mg Oral BID  . omega-3 acid ethyl esters  1 g Oral Daily  . pantoprazole  40 mg Oral Daily  . senna-docusate  1 tablet Oral BID  . simvastatin  40 mg Oral q morning - 10a  . thiamine  100 mg Oral Daily   Continuous Infusions:   LOS: 1 day    Time spent: 35 minutes    Lady Deutscher, MD, FACP [facility] Pager [phone]  If 7PM-7AM, please contact night-coverage [email or web address] Password [facility] 01/22/2018, 12:57 PM

## 2018-01-23 ENCOUNTER — Inpatient Hospital Stay (HOSPITAL_COMMUNITY): Payer: Medicare Other

## 2018-01-23 ENCOUNTER — Other Ambulatory Visit: Payer: Self-pay

## 2018-01-23 ENCOUNTER — Inpatient Hospital Stay (HOSPITAL_COMMUNITY)
Admission: RE | Admit: 2018-01-23 | Discharge: 2018-01-31 | DRG: 057 | Disposition: A | Discharge status: Home Health | Payer: Medicare Other | Source: Intra-hospital | Attending: Physical Medicine & Rehabilitation | Admitting: Physical Medicine & Rehabilitation

## 2018-01-23 ENCOUNTER — Inpatient Hospital Stay (HOSPITAL_COMMUNITY): Payer: Medicare Other | Admitting: Physical Therapy

## 2018-01-23 ENCOUNTER — Encounter (HOSPITAL_COMMUNITY): Payer: Self-pay

## 2018-01-23 DIAGNOSIS — R3915 Urgency of urination: Secondary | ICD-10-CM | POA: Diagnosis present

## 2018-01-23 DIAGNOSIS — R0602 Shortness of breath: Secondary | ICD-10-CM

## 2018-01-23 DIAGNOSIS — R740 Nonspecific elevation of levels of transaminase and lactic acid dehydrogenase [LDH]: Secondary | ICD-10-CM | POA: Diagnosis not present

## 2018-01-23 DIAGNOSIS — E785 Hyperlipidemia, unspecified: Secondary | ICD-10-CM

## 2018-01-23 DIAGNOSIS — R531 Weakness: Secondary | ICD-10-CM | POA: Diagnosis present

## 2018-01-23 DIAGNOSIS — Z7982 Long term (current) use of aspirin: Secondary | ICD-10-CM

## 2018-01-23 DIAGNOSIS — I69128 Other speech and language deficits following nontraumatic intracerebral hemorrhage: Secondary | ICD-10-CM | POA: Diagnosis not present

## 2018-01-23 DIAGNOSIS — Z8614 Personal history of Methicillin resistant Staphylococcus aureus infection: Secondary | ICD-10-CM

## 2018-01-23 DIAGNOSIS — R0902 Hypoxemia: Secondary | ICD-10-CM

## 2018-01-23 DIAGNOSIS — Z823 Family history of stroke: Secondary | ICD-10-CM

## 2018-01-23 DIAGNOSIS — Z800 Family history of malignant neoplasm of digestive organs: Secondary | ICD-10-CM | POA: Diagnosis not present

## 2018-01-23 DIAGNOSIS — I252 Old myocardial infarction: Secondary | ICD-10-CM

## 2018-01-23 DIAGNOSIS — I619 Nontraumatic intracerebral hemorrhage, unspecified: Secondary | ICD-10-CM

## 2018-01-23 DIAGNOSIS — Z85038 Personal history of other malignant neoplasm of large intestine: Secondary | ICD-10-CM

## 2018-01-23 DIAGNOSIS — I10 Essential (primary) hypertension: Secondary | ICD-10-CM

## 2018-01-23 DIAGNOSIS — Z86010 Personal history of colonic polyps: Secondary | ICD-10-CM

## 2018-01-23 DIAGNOSIS — G479 Sleep disorder, unspecified: Secondary | ICD-10-CM | POA: Diagnosis present

## 2018-01-23 DIAGNOSIS — Z8249 Family history of ischemic heart disease and other diseases of the circulatory system: Secondary | ICD-10-CM

## 2018-01-23 DIAGNOSIS — I482 Chronic atrial fibrillation: Secondary | ICD-10-CM | POA: Diagnosis present

## 2018-01-23 DIAGNOSIS — Z79899 Other long term (current) drug therapy: Secondary | ICD-10-CM

## 2018-01-23 DIAGNOSIS — Z9049 Acquired absence of other specified parts of digestive tract: Secondary | ICD-10-CM | POA: Diagnosis not present

## 2018-01-23 DIAGNOSIS — I69198 Other sequelae of nontraumatic intracerebral hemorrhage: Principal | ICD-10-CM

## 2018-01-23 DIAGNOSIS — Z87891 Personal history of nicotine dependence: Secondary | ICD-10-CM

## 2018-01-23 DIAGNOSIS — R2689 Other abnormalities of gait and mobility: Secondary | ICD-10-CM | POA: Diagnosis present

## 2018-01-23 DIAGNOSIS — R7989 Other specified abnormal findings of blood chemistry: Secondary | ICD-10-CM | POA: Diagnosis present

## 2018-01-23 DIAGNOSIS — R29700 NIHSS score 0: Secondary | ICD-10-CM | POA: Diagnosis present

## 2018-01-23 DIAGNOSIS — R7303 Prediabetes: Secondary | ICD-10-CM | POA: Diagnosis not present

## 2018-01-23 DIAGNOSIS — I610 Nontraumatic intracerebral hemorrhage in hemisphere, subcortical: Secondary | ICD-10-CM | POA: Diagnosis not present

## 2018-01-23 DIAGNOSIS — Z9181 History of falling: Secondary | ICD-10-CM

## 2018-01-23 DIAGNOSIS — R071 Chest pain on breathing: Secondary | ICD-10-CM

## 2018-01-23 DIAGNOSIS — I639 Cerebral infarction, unspecified: Secondary | ICD-10-CM | POA: Diagnosis not present

## 2018-01-23 DIAGNOSIS — Z7901 Long term (current) use of anticoagulants: Secondary | ICD-10-CM

## 2018-01-23 DIAGNOSIS — G301 Alzheimer's disease with late onset: Secondary | ICD-10-CM

## 2018-01-23 DIAGNOSIS — R7401 Elevation of levels of liver transaminase levels: Secondary | ICD-10-CM

## 2018-01-23 DIAGNOSIS — R413 Other amnesia: Secondary | ICD-10-CM | POA: Diagnosis present

## 2018-01-23 DIAGNOSIS — Z833 Family history of diabetes mellitus: Secondary | ICD-10-CM

## 2018-01-23 DIAGNOSIS — R079 Chest pain, unspecified: Secondary | ICD-10-CM | POA: Diagnosis present

## 2018-01-23 DIAGNOSIS — K5900 Constipation, unspecified: Secondary | ICD-10-CM | POA: Diagnosis present

## 2018-01-23 DIAGNOSIS — F0280 Dementia in other diseases classified elsewhere without behavioral disturbance: Secondary | ICD-10-CM

## 2018-01-23 LAB — BASIC METABOLIC PANEL
Anion gap: 11 (ref 5–15)
BUN: 26 mg/dL — AB (ref 8–23)
CALCIUM: 10 mg/dL (ref 8.9–10.3)
CO2: 28 mmol/L (ref 22–32)
Chloride: 98 mmol/L (ref 98–111)
Creatinine, Ser: 0.95 mg/dL (ref 0.61–1.24)
GFR calc Af Amer: >60 mL/min (ref >60)
GLUCOSE: 166 mg/dL — AB (ref 70–99)
Potassium: 3.7 mmol/L (ref 3.5–5.1)
Sodium: 137 mmol/L (ref 135–145)

## 2018-01-23 LAB — PROTIME-INR
INR: 1.36
PROTHROMBIN TIME: 16.7 s — AB (ref 11.4–15.2)

## 2018-01-23 MED ORDER — ACETAMINOPHEN 325 MG PO TABS
650.0000 mg | ORAL_TABLET | ORAL | Status: DC | PRN
  Administered 2018-01-24 – 2018-01-31 (×15): 650 mg via ORAL
  Filled 2018-01-23 (×17): qty 2

## 2018-01-23 MED ORDER — FOLIC ACID 1 MG PO TABS
1.0000 mg | ORAL_TABLET | Freq: Every day | ORAL | Status: DC
  Administered 2018-01-24 – 2018-01-31 (×8): 1 mg via ORAL
  Filled 2018-01-23 (×8): qty 1

## 2018-01-23 MED ORDER — DONEPEZIL HCL 10 MG PO TABS
10.0000 mg | ORAL_TABLET | Freq: Every day | ORAL | Status: DC
  Filled 2018-01-23 (×2): qty 1

## 2018-01-23 MED ORDER — SENNOSIDES-DOCUSATE SODIUM 8.6-50 MG PO TABS
1.0000 | ORAL_TABLET | Freq: Two times a day (BID) | ORAL | Status: DC
  Administered 2018-01-23 – 2018-01-31 (×9): 1 via ORAL
  Filled 2018-01-23 (×15): qty 1

## 2018-01-23 MED ORDER — OMEGA-3-ACID ETHYL ESTERS 1 G PO CAPS
1.0000 g | ORAL_CAPSULE | Freq: Every day | ORAL | Status: DC
  Administered 2018-01-24 – 2018-01-31 (×8): 1 g via ORAL
  Filled 2018-01-23 (×8): qty 1

## 2018-01-23 MED ORDER — ALPRAZOLAM 0.25 MG PO TABS
0.2500 mg | ORAL_TABLET | Freq: Two times a day (BID) | ORAL | Status: DC | PRN
  Administered 2018-01-23 – 2018-01-31 (×6): 0.25 mg via ORAL
  Filled 2018-01-23 (×7): qty 1

## 2018-01-23 MED ORDER — NITROGLYCERIN 0.4 MG SL SUBL
0.4000 mg | SUBLINGUAL_TABLET | SUBLINGUAL | Status: DC | PRN
  Administered 2018-01-27: 0.4 mg via SUBLINGUAL
  Filled 2018-01-23: qty 1

## 2018-01-23 MED ORDER — PANTOPRAZOLE SODIUM 40 MG PO TBEC
40.0000 mg | DELAYED_RELEASE_TABLET | Freq: Every day | ORAL | Status: DC
  Administered 2018-01-24 – 2018-01-31 (×8): 40 mg via ORAL
  Filled 2018-01-23 (×8): qty 1

## 2018-01-23 MED ORDER — ZOLPIDEM TARTRATE 5 MG PO TABS
5.0000 mg | ORAL_TABLET | Freq: Every evening | ORAL | Status: DC | PRN
  Administered 2018-01-23: 5 mg via ORAL
  Filled 2018-01-23: qty 1

## 2018-01-23 MED ORDER — ATORVASTATIN CALCIUM 40 MG PO TABS
40.0000 mg | ORAL_TABLET | Freq: Every day | ORAL | Status: DC
  Administered 2018-01-24 – 2018-01-30 (×7): 40 mg via ORAL
  Filled 2018-01-23 (×7): qty 1

## 2018-01-23 MED ORDER — METOPROLOL TARTRATE 25 MG PO TABS
25.0000 mg | ORAL_TABLET | Freq: Two times a day (BID) | ORAL | Status: DC
  Administered 2018-01-23 – 2018-01-31 (×14): 25 mg via ORAL
  Filled 2018-01-23 (×16): qty 1

## 2018-01-23 NOTE — Progress Notes (Addendum)
Progress Note  Patient Name: Christian Maxwell Date of Encounter: 01/23/2018  Primary Cardiologist: Mertie Moores, MD   Subjective   Pt complains of chest pain with cough that is not new.  Inpatient Medications    Scheduled Meds: . atorvastatin  40 mg Oral q1800  . donepezil  10 mg Oral QHS  . folic acid  1 mg Oral Daily  . furosemide  20 mg Intravenous Q12H  . Influenza vac split quadrivalent PF  0.5 mL Intramuscular Tomorrow-1000  . metoprolol tartrate  25 mg Oral BID  . omega-3 acid ethyl esters  1 g Oral Daily  . pantoprazole  40 mg Oral Daily  . senna-docusate  1 tablet Oral BID  . thiamine  100 mg Oral Daily   Continuous Infusions:  PRN Meds: acetaminophen, ALPRAZolam, labetalol, nitroGLYCERIN, ondansetron (ZOFRAN) IV   Vital Signs    Vitals:   01/22/18 0932 01/22/18 1226 01/22/18 1933 01/23/18 0229  BP: (!) 139/94 107/73 129/68 128/77  Pulse: 82 75 80 69  Resp:  18 (!) 22 (!) 21  Temp:  97.8 F (36.6 C) 98.2 F (36.8 C)   TempSrc:  Oral Oral   SpO2:  95% 95% 92%  Weight:    79.7 kg  Height:        Intake/Output Summary (Last 24 hours) at 01/23/2018 0828 Last data filed at 01/23/2018 0230 Gross per 24 hour  Intake 236 ml  Output 1100 ml  Net -864 ml   Filed Weights   01/21/18 0516 01/22/18 0546 01/23/18 0229  Weight: 79.9 kg 78.5 kg 79.7 kg    Telemetry    Afib - Personally Reviewed  ECG    No new tracings - Personally Reviewed  Physical Exam   GEN: No acute distress.   Neck: No JVD Cardiac: irregular rhythm, regular rate, no murmurs Respiratory: Clear to auscultation bilaterally GI: Soft, nontender, non-distended  MS: No edema; No deformity. Neuro:  Nonfocal  Psych: Normal affect   Labs    Chemistry Recent Labs  Lab 01/20/18 1445  NA 137  K 3.7  CL 103  CO2 23  GLUCOSE 149*  BUN 18  CREATININE 0.93  CALCIUM 9.7  GFRNONAA >60  GFRAA >60  ANIONGAP 11     Hematology Recent Labs  Lab 01/20/18 1445  WBC 14.0*  RBC  5.50  HGB 15.5  HCT 48.7  MCV 88.5  MCH 28.2  MCHC 31.8  RDW 13.1  PLT 173    Cardiac Enzymes Recent Labs  Lab 01/20/18 1955 01/20/18 2304 01/21/18 0157  TROPONINI 0.06* 0.05* 0.04*    Recent Labs  Lab 01/20/18 1453  TROPIPOC 0.09*     BNP Recent Labs  Lab 01/20/18 1445 01/21/18 0157  BNP 299.1* 190.4*     DDimer No results for input(s): DDIMER in the last 168 hours.   Radiology    Dg Wrist Complete Left  Result Date: 01/21/2018 CLINICAL DATA:  Pain in the dorsal radial side of the wrist for 6 months EXAM: LEFT WRIST - COMPLETE 3+ VIEW COMPARISON:  None. FINDINGS: No acute displaced fracture or malalignment. Moderate arthritis at the first [facility] joint. Mild arthritis at the distal radiocarpal joint. Small cysts in the scaphoid bone. IMPRESSION: 1. No acute osseous abnormality. 2. Arthritis at the first [facility] joint and distal radial carpal joint Electronically Signed   By: Donavan Foil M.D.   On: 01/21/2018 14:46   Ct Head Wo Contrast  Result Date: 01/22/2018 CLINICAL DATA:  Hemorrhagic  stroke follow-up. EXAM: CT HEAD WITHOUT CONTRAST TECHNIQUE: Contiguous axial images were obtained from the base of the skull through the vertex without intravenous contrast. COMPARISON:  CT head from yesterday. FINDINGS: Brain: Unchanged 7-8 mm hemorrhage in the left thalamus. No new hemorrhage. No significant mass effect. Minimal surrounding edema is unchanged. No hydrocephalus or extra-axial collection. Stable mild cerebral atrophy and chronic microvascular ischemic changes. Vascular: Atherosclerotic vascular calcification of the carotid siphons. No hyperdense vessel. Skull: Negative for fracture or focal lesion. Sinuses/Orbits: No acute finding. Other: None. IMPRESSION: 1. Unchanged small 7-8 mm hemorrhage in the left thalamus. No new hemorrhage or progressive mass effect. Electronically Signed   By: Titus Dubin M.D.   On: 01/22/2018 08:06   Ct Head Wo Contrast  Result Date:  01/21/2018 CLINICAL DATA:  Neurologic change. EXAM: CT HEAD WITHOUT CONTRAST TECHNIQUE: Contiguous axial images were obtained from the base of the skull through the vertex without intravenous contrast. COMPARISON:  None. FINDINGS: Brain: Small hemorrhage in the left thalamus. No evidence of significant mass effect. Mild brain parenchymal volume loss and periventricular microangiopathy. Vascular: Calcific atherosclerotic disease at the skull base. Skull: Normal. Negative for fracture or focal lesion. Sinuses/Orbits: No acute finding. Other: None. IMPRESSION: 7 mm hemorrhage in the left thalamus. No evidence of mass effect. Moderate brain parenchymal volume loss and microangiopathy. These results were called by telephone at the time of interpretation on 01/21/2018 at 11:13 am to Dr. Evangeline Gula, who verbally acknowledged these results. Electronically Signed   By: Fidela Salisbury M.D.   On: 01/21/2018 11:17   Mr Jeri Cos IO Contrast  Result Date: 01/22/2018 CLINICAL DATA:  Cerebral hemorrhage EXAM: MRI HEAD WITHOUT AND WITH CONTRAST TECHNIQUE: Multiplanar, multiecho pulse sequences of the brain and surrounding structures were obtained without and with intravenous contrast. CONTRAST:  6 mL Gadovist IV COMPARISON:  CT head 01/22/2018 FINDINGS: Brain: Left thalamic hemorrhage 7 mm unchanged from CT. No other areas of hemorrhage. Negative for acute infarct. Negative for mass lesion. No enhancing mass in the left thalamus. Marked temporal lobe atrophy involving the hippocampus. Question dementia. Mild chronic microvascular ischemic change in the white matter. Normal enhancement postcontrast administration. Vascular: Normal arterial flow void Skull and upper cervical spine: Negative Sinuses/Orbits: Mild mucosal edema paranasal sinuses. Bilateral cataract surgery Other: None IMPRESSION: 7 mm acute hemorrhage left thalamus unchanged from CT. No underlying ischemic infarction or mass lesion. Cerebral atrophy most prominent the  temporal lobes. Marked hippocampal atrophy bilaterally. Question dementia. Mild chronic microvascular ischemia in the white matter. Electronically Signed   By: Franchot Gallo M.D.   On: 01/22/2018 10:22    Cardiac Studies   Echo 01/21/18:  - Left ventricle: The cavity size was normal. There was mild concentric hypertrophy. Systolic function was normal. The estimated ejection fraction was in the range of 60% to 65%. Wall motion was normal; there were no regional wall motion abnormalities. - Ventricular septum: Septal motion showed abnormal function and dyssynchrony consistent with bundle branch block. - Aortic valve: Transvalvular velocity was within the normal range. There was no stenosis. There was trivial regurgitation. - Mitral valve: Transvalvular velocity was within the normal range. There was no evidence for stenosis. There was no regurgitation. - Left atrium: The atrium was severely dilated. - Right ventricle: The cavity size was normal. Wall thickness was normal. Systolic function was normal. - Tricuspid valve: There was mild regurgitation. - Pulmonary arteries: Systolic pressure was within the normal range. PA peak pressure: 23 mm Hg (S).  Patient Profile  84 y.o. male with ICH on coumadin for Afib.  Assessment & Plan    1. Intracranial hemorrhage Per neuro - repeat head CT in 2 weeks. If ICH resolved, may resume AC and antiplatelet therapy. May consider DOAC for decreased bleeding risk.     2. CAD - ASA on hold - resume after head imaging, per neuro - no ischemic evaluation this admission   3. PAF - no anticoagulation until repeat head imaging with neuro - consider switching to 5 mg eliquis BID - pt had 2 falls in the past 2 months prior to [place] (family is worries about his driving also), may reconsider adding back anticoagulation altogether       For questions or updates, please contact [facility] Please consult [email or web address] for  contact info under        Signed, Ledora Bottcher, PA  01/23/2018, 8:28 AM    I have examined the patient and reviewed assessment and plan and discussed with patient.  Agree with above as stated.  Rate controlled.  I would be concerned about longterm anticoagulation given his falls and now ICH.  Wil defer to Dr. Acie Fredrickson as the patient will be off of the Coumadin for 2 weeks.  Of note, his ICH happened with a normal INR, so there will still be some risk with a DOAC.    THe chest pain he reports is worse with deep breathing and palpation.  I doubt this is his heart and I think it is more likely chest wall pain.    Larae Grooms

## 2018-01-23 NOTE — Progress Notes (Deleted)
PMR Admission Coordinator Pre-Admission Assessment  Patient: Christian Maxwell is an 84 y.o., male MRN: [id number] DOB: October 24, 1932 Height: _0  (175.3 cm) Weight: 79.7 kg(pt has chest pain unable to stabd)                                                                                                                                                  Insurance Information HMO:     PPO:      PCP:      IPA:      80/20: yes     OTHER:  PRIMARY: Medicare Part A and B      Policy#: [id number]      Subscriber: Patient CM Name:       Phone#:      Fax#:  Pre-Cert#:       Employer:  Benefits:  Phone #: NA     Name: Verified Online via [place]. Date: part A and B effective: 10/14/1999     Deduct: $1,364      Out of Pocket Max:       Life Max:  CIR: Covered per Medicare guidelines once yearly deductible is met      SNF: days 1-20, 100%; days 21-100, 80% Outpatient: 80%     Co-Pay: 20% Home Health: 100%      Co-Pay:  DME: 80%     Co-Pay: 20% Providers: Pt's choice SECONDARY: BCBS      Policy#: [id number]      Subscriber: patient CM Name:       Phone#:      Fax#:  Pre-Cert#:       Employer:  Benefits:  Phone #: [phone]     Name:  Eff. Date:      Deduct:       Out of Pocket Max:       Life Max:  CIR:       SNF:  Outpatient:      Co-Pay:  Home Health:       Co-Pay:  DME:      Co-Pay:   Medicaid Application Date:       Case Manager:  Disability Application Date:       Case Worker:   Emergency Contact Information         Contact Information    Name Relation Home Work Mobile   May,Natalie Daughter [phone]  [phone]     Current Medical History  Patient Admitting Diagnosis: left thalamic hemorrhage, hx of dementia, gait disorder  History of Present Illness: Christian Maxwell a 84 y.o.right-handed malewith history of dementia maintained on Aricept, chronic atrial fibrillation maintained on Coumadin, colon cancer, hypertension. Per chart review patient lives  alone. Reported to be independent. Driving short distances. Two-level home with bedroom upstairs. His closest family is 1-1/2 hours away question assistance on discharge. Presented 01/21/2018 with  nonspecific chest pain, dizziness and generalized weakness. CT/MRI showed 7 mm acute hemorrhage left thalamus. No underlying ischemic infarction or mass lesion. Echocardiogram with ejection fraction of 65% no wall motion abnormalities. Troponin mildly elevated 0.06, INR 2.02. Coumadin held due to bleed. Cardiology follow-up for mildly elevated troponin nonspecific chest pain did not feel to be acute coronary syndrome and monitored. Physical and Occupational Therapy evaluations pending. MD has requested physical medicine rehab consult. Pt is to be admitted to CIR on 01/23/18.   Complete NIHSS TOTAL: 0  Past Medical History      Past Medical History:  Diagnosis Date  . Abscess 7/13   abd abscess  . Cancer [facility])    colon cancer  . Chronic atrial fibrillation ([place])   . History of colon cancer 2000   T3N0 s/p colectomy  . History of echocardiogram    a. Echo 2/13:  Mild LVH, EF 55-60%, mild MR, moderate LAE, mild to moderate RAE;  b. Echo 1/17: mild LVH, vigorous LVF, EF 65-70%, no RWMA, trivial AI, trivial MR, severe LAE, mild RAE, mod TR, PASP 32 mmHg  . History of stress test    a. Cardiolite in 9/04: EF 67%, no ischemia.;  b. Myoview 6/16: no ischemia   . Hx of adenomatous colonic polyps   . Hyperlipidemia   . Hypertension   . METHICILLIN RESISTANT STAPHYLOCOCCUS AUREUS INFECTION 12/12/2006   Annotation: stitch abcess in lower abdomen after colon cancer resection Qualifier: Diagnosis of  By: [name] MD, [name]    . Myocardial infarction ([place]) 1985  . Postoperative stitch abscess 07/19/2011  . PVC's (premature ventricular contractions)   . Syncope and collapse    pt denies  . Thyroid disease    followed by [name]    Family History  family history includes CAD  (age of onset: 26) in his brother; CAD (age of onset: 31) in his father; Colon cancer in his cousin; Dementia in his brother and father; Dementia (age of onset: 25) in his sister; Diabetes in his brother; Hypertension in his father; Stroke in his brother, brother, father, and mother; Stroke (age of onset: 70) in his sister.  Prior Rehab/Hospitalizations:  Has the patient had major surgery during 100 days prior to admission? No  Current Medications   Current Facility-Administered Medications:  .  acetaminophen (TYLENOL) tablet 650 mg, 650 mg, Oral, Q4H PRN, Osei-Bonsu, George, MD, 650 mg at 01/23/18 1201 .  ALPRAZolam (XANAX) tablet 0.25 mg, 0.25 mg, Oral, BID PRN, Osei-Bonsu, George, MD .  atorvastatin (LIPITOR) tablet 40 mg, 40 mg, Oral, q1800, Rosalin Hawking, MD, 40 mg at 01/22/18 1813 .  donepezil (ARICEPT) tablet 10 mg, 10 mg, Oral, QHS, Osei-Bonsu, George, MD, 10 mg at 01/22/18 2052 .  folic acid (FOLVITE) tablet 1 mg, 1 mg, Oral, Daily, Osei-Bonsu, George, MD, 1 mg at 01/23/18 0910 .  Influenza vac split quadrivalent PF (FLUARIX) injection 0.5 mL, 0.5 mL, Intramuscular, Tomorrow-1000, Osei-Bonsu, George, MD .  labetalol (NORMODYNE,TRANDATE) injection 5 mg, 5 mg, Intravenous, Q2H PRN, Black, Karen M, NP .  metoprolol tartrate (LOPRESSOR) tablet 25 mg, 25 mg, Oral, BID, Osei-Bonsu, George, MD, 25 mg at 01/23/18 0910 .  nitroGLYCERIN (NITROSTAT) SL tablet 0.4 mg, 0.4 mg, Sublingual, Q5 min PRN, Osei-Bonsu, George, MD, 0.4 mg at 01/22/18 1145 .  omega-3 acid ethyl esters (LOVAZA) capsule 1 g, 1 g, Oral, Daily, Osei-Bonsu, George, MD, 1 g at 01/23/18 0910 .  ondansetron (ZOFRAN) injection 4 mg, 4 mg, Intravenous, Q6H PRN, Osei-Bonsu, Iona Beard,  MD .  pantoprazole (PROTONIX) EC tablet 40 mg, 40 mg, Oral, Daily, Marliss Coots, PA-C, 40 mg at 01/23/18 0910 .  senna-docusate (Senokot-S) tablet 1 tablet, 1 tablet, Oral, BID, Marliss Coots, PA-C, 1 tablet at 01/23/18 0910 .  thiamine (VITAMIN B-1)  tablet 100 mg, 100 mg, Oral, Daily, Osei-Bonsu, George, MD, 100 mg at 01/23/18 0910  Patients Current Diet:     Diet Order                  Diet Heart Room service appropriate? Yes; Fluid consistency: Thin  Diet effective now               Precautions / Restrictions Precautions Precautions: Fall Precaution Comments: Chest pain Restrictions Weight Bearing Restrictions: No   Has the patient had 2 or more falls or a fall with injury in the past year?Unknown; anticipate yes.   Prior Activity Level Community (5-7x/wk): yes, active churchgoer, runs errands. drove PTA  Home Assistive Devices / Equipment Home Assistive Devices/Equipment: None Home Equipment: Walker - 2 wheels  Prior Device Use: Indicate devices/aids used by the patient prior to current illness, exacerbation or injury? None of the above  Prior Functional Level Prior Function Level of Independence: Independent Comments: Per daughter, pt recently shared with family regarding falls at home. Pt reports 2 falls in the past two months  Self Care: Did the patient need help bathing, dressing, using the toilet or eating?  Independent  Indoor Mobility: Did the patient need assistance with walking from room to room (with or without device)? Independent  Stairs: Did the patient need assistance with internal or external stairs (with or without device)? Independent  Functional Cognition: Did the patient need help planning regular tasks such as shopping or remembering to take medications? Independent  Current Functional Level Cognition  Arousal/Alertness: Awake/alert Overall Cognitive Status: Impaired/Different from baseline Current Attention Level: Sustained Orientation Level: Oriented X4 Following Commands: Follows one step commands inconsistently Safety/Judgement: Decreased awareness of safety, Decreased awareness of deficits General Comments: Daughter present and reports cognition seems slower  than normal. Reports stubborness/personality seems similar to baseline. Very poor safety awareness throughout session; required max encouragement to attempt to do things himself. Pt stating "I can't do this" when he does want to do something. Attention: Selective Selective Attention: Impaired Selective Attention Impairment: Verbal basic Memory: Impaired Memory Impairment: Retrieval deficit, Prospective memory Awareness: Impaired Awareness Impairment: Intellectual impairment Problem Solving: Impaired Behaviors: Impulsive Safety/Judgment: Impaired    Extremity Assessment (includes Sensation/Coordination)  Upper Extremity Assessment: Generalized weakness, LUE deficits/detail, RUE deficits/detail RUE Deficits / Details: Pt reporting he is unable to perform forward flexion of shoulder due to pain stating "I can't use that hand." Pt WFL during oral care task RUE: Unable to fully assess due to pain LUE Deficits / Details: LUE pain and ROM since fall at home, difficult to assess true function due to pt guarding and not following commands consistently. Decrease shoulder flexion/abd seems to stem from limited scap ROM LUE Coordination: decreased gross motor  Lower Extremity Assessment: Generalized weakness    ADLs  Overall ADL's : Needs assistance/impaired Eating/Feeding: Set up, Supervision/ safety, Sitting Grooming: Oral care, Standing, Minimal assistance Grooming Details (indicate cue type and reason): Min A for standing balance at sink during oral care. Pt requestign OT to place tooth paste on tooth brush stating "I can't do this." When OT encouraged pt to perform tasks at his best ability, pt compelted oral care without need for assists during [facility]  tasks. Pt stating "I don't know why they are sending children in here. I need an adult to help me." Upper Body Bathing: Minimal assistance, Sitting Lower Body Bathing: Sit to/from stand, Moderate assistance Lower Body Bathing Details (indicate cue  type and reason): Mod A for standing Upper Body Dressing : Minimal assistance, Sitting Lower Body Dressing: Maximal assistance, Sit to/from stand Lower Body Dressing Details (indicate cue type and reason): Pt stating he cannot put on his socks and requested OT do it. Encouraging pt to perform task and he became frustated.  Toilet Transfer: Minimal assistance, Ambulation, RW, Moderate assistance(Simulated in room) Functional mobility during ADLs: Minimal assistance, Rolling walker General ADL Comments: Pt presenting with decreased balance and required increased assistance for safety. Pt with single LOB during dynamic balance tasks and reuqired Mod A.    Mobility  Overal bed mobility: Needs Assistance Bed Mobility: Supine to Sit Supine to sit: Mod assist, HOB elevated General bed mobility comments: ModA for UE support to assist trunk elevation. Pt c/o chest pain upon sitting which resolved during rest of session (RN aware)    Transfers  Overall transfer level: Needs assistance Equipment used: Rolling walker (2 wheeled) Transfers: Sit to/from Stand Sit to Stand: Mod assist General transfer comment: ModA to assist trunk elevation; pt not putting hands correctly on RW despite education    Ambulation / Gait / Stairs / Wheelchair Mobility  Ambulation/Gait Ambulation/Gait assistance: Min guard, Min assist Gait Distance (Feet): 15 Feet Assistive device: Rolling walker (2 wheeled) Gait Pattern/deviations: Step-through pattern, Decreased stride length, Trunk flexed General Gait Details: Close min guard for balance amb with RW. Pt picking up walker and turning abruptly with all direction changes requiring minA to maintain balance Gait velocity: Decreased Gait velocity interpretation: <1.8 ft/sec, indicate of risk for recurrent falls    Posture / Balance Balance Overall balance assessment: Needs assistance Sitting balance-Leahy Scale: Fair Standing balance support: Bilateral upper  extremity supported, During functional activity, Single extremity supported Standing balance-Leahy Scale: Poor Standing balance comment: Reliant on UE support with standing tasks    Special needs/care consideration BiPAP/CPAP: no CPM: no Continuous Drip IV: no Dialysis: no        Days: no Life Vest: no Oxygen: no Special Bed: no Trach Size: nop Wound Vac (area): no      Location: no Skin: abrasion and ecchymosis on head, nose, abrasion to bilateral arm, leg, knee, elbow              Bowel mgmt:last BM: 01/20/18.  Bladder mgmt: external catheter present Diabetic mgmt: glucose monitored in hospital.      Previous Home Environment Living Arrangements: Alone  Lives With: Alone Available Help at Discharge: Family, Available PRN/intermittently Type of Home: House Home Layout: Two level, Able to live on main level with bedroom/bathroom Home Access: Stairs to enter Entrance Stairs-Rails: Right, Left Entrance Stairs-Number of Steps: [place]: No  Discharge Living Setting Plans for Discharge Living Setting: Patient's home(with 24/7 hired caregiver assistance) Type of Home at Discharge: House Discharge Home Layout: Two level, Able to live on main level with bedroom/bathroom Alternate Level Stairs-Rails: None(unknown does not use upstairs) Alternate Level Stairs-Number of Steps: 8 Discharge Home Access: Stairs to enter Entrance Stairs-Rails: Left Entrance Stairs-Number of Steps: 8 Discharge Bathroom Shower/Tub: Walk-in shower Discharge Bathroom Toilet: Standard Discharge Bathroom Accessibility: Yes How Accessible: Accessible via walker Does the patient have any problems obtaining your medications?: No  Social/Family/Support Systems Patient Roles: Other (Comment)(retired, runs errands; drove PTA)  Contact Information: daughters: [name] and Tami Anticipated Caregiver: caregiver will be hired assistance Anticipated Ambulance person Information: daugther [name]:  [phone]; [place]: [phone]; son in law: Rush Landmark [phone] Ability/Limitations of Caregiver: supervision 24/7 Caregiver Availability: 24/7 Discharge Plan Discussed with Primary Caregiver: Yes(discussed w/ family who is planning to hire assistance at [place]) Is Caregiver In Agreement with Plan?: Yes Does Caregiver/Family have Issues with Lodging/Transportation while Pt is in Rehab?: No   Goals/Additional Needs Patient/Family Goal for Rehab: PT/OT/SLP: Mod I/Supervision Expected length of stay: 7-10 days Cultural Considerations: [employer] Dietary Needs: heart healthy, thin liquids Equipment Needs: TBD Special Service Needs: NA Additional Information: may need community resources for support; med assistance Pt/Family Agrees to Admission and willing to participate: Yes Program Orientation Provided & Reviewed with Pt/Caregiver Including Roles  & Responsibilities: Yes(reveiwed with two daugthers and pt)  Barriers to Discharge: Decreased caregiver support, Home environment access/layout, Lack of/limited family support  Barriers to Discharge Comments: family plans to hire 24/7 caregiver support; would like to have resouces provided to assist with finding agencies; pt may need assistance with getting groceries and medicine.    Decrease burden of Care through IP rehab admission: NA   Possible need for SNF placement upon discharge:Not anticipated. Pt's family has been educated on need for 24/7 Supervision at DC after short CIR stay. Family aware and working towards finding right company. Pt has good prognosis for further progress.    Patient Condition: This patient's condition remains as documented in the consult dated 01/22/18, in which the Rehabilitation Physician determined and documented that the patient's condition is appropriate for intensive rehabilitative care in an inpatient rehabilitation facility pending social support at DC. These areas have been addressed. Family aware of need for  24/7 Support at DC home after short CIR stay. Family plans to financially cover assistance. Will admit to inpatient rehab today.  Preadmission Screen Completed By:  Jhonnie Garner, 01/23/2018 4:26 PM ______________________________________________________________________   Discussed status with Dr. Posey Pronto on 01/23/18 at 5:11PM and received telephone approval for admission today.  Admission Coordinator:  Jhonnie Garner, time 5:11PM Sudie Grumbling 01/23/18.            Cosigned by: Jamse Arn, MD at 01/23/2018 5:16 PM  Revision History

## 2018-01-23 NOTE — Progress Notes (Signed)
PMR Admission Coordinator Pre-Admission Assessment  Patient: Christian Maxwell is an 84 y.o., male MRN: [id number] DOB: October 24, 1932 Height: _0  (175.3 cm) Weight: 79.7 kg(pt has chest pain unable to stabd)                                                                                                                                                  Insurance Information HMO:     PPO:      PCP:      IPA:      80/20: yes     OTHER:  PRIMARY: Medicare Part A and B      Policy#: [id number]      Subscriber: Patient CM Name:       Phone#:      Fax#:  Pre-Cert#:       Employer:  Benefits:  Phone #: NA     Name: Verified Online via [place]. Date: part A and B effective: 10/14/1999     Deduct: $1,364      Out of Pocket Max:       Life Max:  CIR: Covered per Medicare guidelines once yearly deductible is met      SNF: days 1-20, 100%; days 21-100, 80% Outpatient: 80%     Co-Pay: 20% Home Health: 100%      Co-Pay:  DME: 80%     Co-Pay: 20% Providers: Pt's choice SECONDARY: BCBS      Policy#: [id number]      Subscriber: patient CM Name:       Phone#:      Fax#:  Pre-Cert#:       Employer:  Benefits:  Phone #: [phone]     Name:  Eff. Date:      Deduct:       Out of Pocket Max:       Life Max:  CIR:       SNF:  Outpatient:      Co-Pay:  Home Health:       Co-Pay:  DME:      Co-Pay:   Medicaid Application Date:       Case Manager:  Disability Application Date:       Case Worker:   Emergency Contact Information         Contact Information    Name Relation Home Work Mobile   Christian Maxwell,Christian Maxwell Daughter [phone]  [phone]     Current Medical History  Patient Admitting Diagnosis: left thalamic hemorrhage, hx of dementia, gait disorder  History of Present Illness: Christian Maxwell a 84 y.o.right-handed malewith history of dementia maintained on Aricept, chronic atrial fibrillation maintained on Coumadin, colon cancer, hypertension. Per chart review patient lives  alone. Reported to be independent. Driving short distances. Two-level home with bedroom upstairs. His closest family is 1-1/2 hours away question assistance on discharge. Presented 01/21/2018 with  nonspecific chest pain, dizziness and generalized weakness. CT/MRI showed 7 mm acute hemorrhage left thalamus. No underlying ischemic infarction or mass lesion. Echocardiogram with ejection fraction of 65% no wall motion abnormalities. Troponin mildly elevated 0.06, INR 2.02. Coumadin held due to bleed. Cardiology follow-up for mildly elevated troponin nonspecific chest pain did not feel to be acute coronary syndrome and monitored. Physical and Occupational Therapy evaluations pending. MD has requested physical medicine rehab consult. Pt is to be admitted to CIR on 01/23/18.   Complete NIHSS TOTAL: 0  Past Medical History      Past Medical History:  Diagnosis Date  . Abscess 7/13   abd abscess  . Cancer [facility])    colon cancer  . Chronic atrial fibrillation ([place])   . History of colon cancer 2000   T3N0 s/p colectomy  . History of echocardiogram    a. Echo 2/13:  Mild LVH, EF 55-60%, mild MR, moderate LAE, mild to moderate RAE;  b. Echo 1/17: mild LVH, vigorous LVF, EF 65-70%, no RWMA, trivial AI, trivial MR, severe LAE, mild RAE, mod TR, PASP 32 mmHg  . History of stress test    a. Cardiolite in 9/04: EF 67%, no ischemia.;  b. Myoview 6/16: no ischemia   . Hx of adenomatous colonic polyps   . Hyperlipidemia   . Hypertension   . METHICILLIN RESISTANT STAPHYLOCOCCUS AUREUS INFECTION 12/12/2006   Annotation: stitch abcess in lower abdomen after colon cancer resection Qualifier: Diagnosis of  By: [name] MD, [name]    . Myocardial infarction ([place]) 1985  . Postoperative stitch abscess 07/19/2011  . PVC's (premature ventricular contractions)   . Syncope and collapse    pt denies  . Thyroid disease    followed by [name]    Family History  family history includes CAD  (age of onset: 26) in his brother; CAD (age of onset: 31) in his father; Colon cancer in his cousin; Dementia in his brother and father; Dementia (age of onset: 25) in his sister; Diabetes in his brother; Hypertension in his father; Stroke in his brother, brother, father, and mother; Stroke (age of onset: 70) in his sister.  Prior Rehab/Hospitalizations:  Has the patient had major surgery during 100 days prior to admission? No  Current Medications   Current Facility-Administered Medications:  .  acetaminophen (TYLENOL) tablet 650 mg, 650 mg, Oral, Q4H PRN, Osei-Bonsu, George, MD, 650 mg at 01/23/18 1201 .  ALPRAZolam (XANAX) tablet 0.25 mg, 0.25 mg, Oral, BID PRN, Osei-Bonsu, George, MD .  atorvastatin (LIPITOR) tablet 40 mg, 40 mg, Oral, q1800, Rosalin Hawking, MD, 40 mg at 01/22/18 1813 .  donepezil (ARICEPT) tablet 10 mg, 10 mg, Oral, QHS, Osei-Bonsu, George, MD, 10 mg at 01/22/18 2052 .  folic acid (FOLVITE) tablet 1 mg, 1 mg, Oral, Daily, Osei-Bonsu, George, MD, 1 mg at 01/23/18 0910 .  Influenza vac split quadrivalent PF (FLUARIX) injection 0.5 mL, 0.5 mL, Intramuscular, Tomorrow-1000, Osei-Bonsu, George, MD .  labetalol (NORMODYNE,TRANDATE) injection 5 mg, 5 mg, Intravenous, Q2H PRN, Black, Karen M, NP .  metoprolol tartrate (LOPRESSOR) tablet 25 mg, 25 mg, Oral, BID, Osei-Bonsu, George, MD, 25 mg at 01/23/18 0910 .  nitroGLYCERIN (NITROSTAT) SL tablet 0.4 mg, 0.4 mg, Sublingual, Q5 min PRN, Osei-Bonsu, George, MD, 0.4 mg at 01/22/18 1145 .  omega-3 acid ethyl esters (LOVAZA) capsule 1 g, 1 g, Oral, Daily, Osei-Bonsu, George, MD, 1 g at 01/23/18 0910 .  ondansetron (ZOFRAN) injection 4 mg, 4 mg, Intravenous, Q6H PRN, Osei-Bonsu, Iona Beard,  MD .  pantoprazole (PROTONIX) EC tablet 40 mg, 40 mg, Oral, Daily, Marliss Coots, PA-C, 40 mg at 01/23/18 0910 .  senna-docusate (Senokot-S) tablet 1 tablet, 1 tablet, Oral, BID, Marliss Coots, PA-C, 1 tablet at 01/23/18 0910 .  thiamine (VITAMIN B-1)  tablet 100 mg, 100 mg, Oral, Daily, Osei-Bonsu, George, MD, 100 mg at 01/23/18 0910  Patients Current Diet:     Diet Order                  Diet Heart Room service appropriate? Yes; Fluid consistency: Thin  Diet effective now               Precautions / Restrictions Precautions Precautions: Fall Precaution Comments: Chest pain Restrictions Weight Bearing Restrictions: No   Has the patient had 2 or more falls or a fall with injury in the past year?Unknown; anticipate yes.   Prior Activity Level Community (5-7x/wk): yes, active churchgoer, runs errands. drove PTA  Home Assistive Devices / Equipment Home Assistive Devices/Equipment: None Home Equipment: Walker - 2 wheels  Prior Device Use: Indicate devices/aids used by the patient prior to current illness, exacerbation or injury? None of the above  Prior Functional Level Prior Function Level of Independence: Independent Comments: Per daughter, pt recently shared with family regarding falls at home. Pt reports 2 falls in the past two months  Self Care: Did the patient need help bathing, dressing, using the toilet or eating?  Independent  Indoor Mobility: Did the patient need assistance with walking from room to room (with or without device)? Independent  Stairs: Did the patient need assistance with internal or external stairs (with or without device)? Independent  Functional Cognition: Did the patient need help planning regular tasks such as shopping or remembering to take medications? Independent  Current Functional Level Cognition  Arousal/Alertness: Awake/alert Overall Cognitive Status: Impaired/Different from baseline Current Attention Level: Sustained Orientation Level: Oriented X4 Following Commands: Follows one step commands inconsistently Safety/Judgement: Decreased awareness of safety, Decreased awareness of deficits General Comments: Daughter present and reports cognition seems slower  than normal. Reports stubborness/personality seems similar to baseline. Very poor safety awareness throughout session; required max encouragement to attempt to do things himself. Pt stating "I can't do this" when he does want to do something. Attention: Selective Selective Attention: Impaired Selective Attention Impairment: Verbal basic Memory: Impaired Memory Impairment: Retrieval deficit, Prospective memory Awareness: Impaired Awareness Impairment: Intellectual impairment Problem Solving: Impaired Behaviors: Impulsive Safety/Judgment: Impaired    Extremity Assessment (includes Sensation/Coordination)  Upper Extremity Assessment: Generalized weakness, LUE deficits/detail, RUE deficits/detail RUE Deficits / Details: Pt reporting he is unable to perform forward flexion of shoulder due to pain stating "I can't use that hand." Pt WFL during oral care task RUE: Unable to fully assess due to pain LUE Deficits / Details: LUE pain and ROM since fall at home, difficult to assess true function due to pt guarding and not following commands consistently. Decrease shoulder flexion/abd seems to stem from limited scap ROM LUE Coordination: decreased gross motor  Lower Extremity Assessment: Generalized weakness    ADLs  Overall ADL's : Needs assistance/impaired Eating/Feeding: Set up, Supervision/ safety, Sitting Grooming: Oral care, Standing, Minimal assistance Grooming Details (indicate cue type and reason): Min A for standing balance at sink during oral care. Pt requestign OT to place tooth paste on tooth brush stating "I can't do this." When OT encouraged pt to perform tasks at his best ability, pt compelted oral care without need for assists during [facility]  tasks. Pt stating "I don't know why they are sending children in here. I need an adult to help me." Upper Body Bathing: Minimal assistance, Sitting Lower Body Bathing: Sit to/from stand, Moderate assistance Lower Body Bathing Details (indicate cue  type and reason): Mod A for standing Upper Body Dressing : Minimal assistance, Sitting Lower Body Dressing: Maximal assistance, Sit to/from stand Lower Body Dressing Details (indicate cue type and reason): Pt stating he cannot put on his socks and requested OT do it. Encouraging pt to perform task and he became frustated.  Toilet Transfer: Minimal assistance, Ambulation, RW, Moderate assistance(Simulated in room) Functional mobility during ADLs: Minimal assistance, Rolling walker General ADL Comments: Pt presenting with decreased balance and required increased assistance for safety. Pt with single LOB during dynamic balance tasks and reuqired Mod A.    Mobility  Overal bed mobility: Needs Assistance Bed Mobility: Supine to Sit Supine to sit: Mod assist, HOB elevated General bed mobility comments: ModA for UE support to assist trunk elevation. Pt c/o chest pain upon sitting which resolved during rest of session (RN aware)    Transfers  Overall transfer level: Needs assistance Equipment used: Rolling walker (2 wheeled) Transfers: Sit to/from Stand Sit to Stand: Mod assist General transfer comment: ModA to assist trunk elevation; pt not putting hands correctly on RW despite education    Ambulation / Gait / Stairs / Wheelchair Mobility  Ambulation/Gait Ambulation/Gait assistance: Min guard, Min assist Gait Distance (Feet): 15 Feet Assistive device: Rolling walker (2 wheeled) Gait Pattern/deviations: Step-through pattern, Decreased stride length, Trunk flexed General Gait Details: Close min guard for balance amb with RW. Pt picking up walker and turning abruptly with all direction changes requiring minA to maintain balance Gait velocity: Decreased Gait velocity interpretation: <1.8 ft/sec, indicate of risk for recurrent falls    Posture / Balance Balance Overall balance assessment: Needs assistance Sitting balance-Leahy Scale: Fair Standing balance support: Bilateral upper  extremity supported, During functional activity, Single extremity supported Standing balance-Leahy Scale: Poor Standing balance comment: Reliant on UE support with standing tasks    Special needs/care consideration BiPAP/CPAP: no CPM: no Continuous Drip IV: no Dialysis: no        Days: no Life Vest: no Oxygen: no Special Bed: no Trach Size: nop Wound Vac (area): no      Location: no Skin: abrasion and ecchymosis on head, nose, abrasion to bilateral arm, leg, knee, elbow              Bowel mgmt:last BM: 01/20/18.  Bladder mgmt: external catheter present Diabetic mgmt: glucose monitored in hospital.      Previous Home Environment Living Arrangements: Alone  Lives With: Alone Available Help at Discharge: Family, Available PRN/intermittently Type of Home: House Home Layout: Two level, Able to live on main level with bedroom/bathroom Home Access: Stairs to enter Entrance Stairs-Rails: Right, Left Entrance Stairs-Number of Steps: [place]: No  Discharge Living Setting Plans for Discharge Living Setting: Patient's home(with 24/7 hired caregiver assistance) Type of Home at Discharge: House Discharge Home Layout: Two level, Able to live on main level with bedroom/bathroom Alternate Level Stairs-Rails: None(unknown does not use upstairs) Alternate Level Stairs-Number of Steps: 8 Discharge Home Access: Stairs to enter Entrance Stairs-Rails: Left Entrance Stairs-Number of Steps: 8 Discharge Bathroom Shower/Tub: Walk-in shower Discharge Bathroom Toilet: Standard Discharge Bathroom Accessibility: Yes How Accessible: Accessible via walker Does the patient have any problems obtaining your medications?: No  Social/Family/Support Systems Patient Roles: Other (Comment)(retired, runs errands; drove PTA)  Contact Information: daughters: Christian Maxwell and Christian Maxwell Anticipated Caregiver: caregiver will be hired assistance Anticipated Ambulance person Information: daugther Christian Maxwell:  [phone]; [place]: [phone]; son in law: Rush Landmark [phone] Ability/Limitations of Caregiver: supervision 24/7 Caregiver Availability: 24/7 Discharge Plan Discussed with Primary Caregiver: Yes(discussed w/ family who is planning to hire assistance at [place]) Is Caregiver In Agreement with Plan?: Yes Does Caregiver/Family have Issues with Lodging/Transportation while Pt is in Rehab?: No   Goals/Additional Needs Patient/Family Goal for Rehab: PT/OT/SLP: Mod I/Supervision Expected length of stay: 7-10 days Cultural Considerations: [employer] Dietary Needs: heart healthy, thin liquids Equipment Needs: TBD Special Service Needs: NA Additional Information: Christian Maxwell need community resources for support; med assistance Pt/Family Agrees to Admission and willing to participate: Yes Program Orientation Provided & Reviewed with Pt/Caregiver Including Roles  & Responsibilities: Yes(reveiwed with two daugthers and pt)  Barriers to Discharge: Decreased caregiver support, Home environment access/layout, Lack of/limited family support  Barriers to Discharge Comments: family plans to hire 24/7 caregiver support; would like to have resouces provided to assist with finding agencies; pt Christian Maxwell need assistance with getting groceries and medicine.    Decrease burden of Care through IP rehab admission: NA   Possible need for SNF placement upon discharge:Not anticipated. Pt's family has been educated on need for 24/7 Supervision at DC after short CIR stay. Family aware and working towards finding right company. Pt has good prognosis for further progress.    Patient Condition: This patient's condition remains as documented in the consult dated 01/22/18, in which the Rehabilitation Physician determined and documented that the patient's condition is appropriate for intensive rehabilitative care in an inpatient rehabilitation facility pending social support at DC. These areas have been addressed. Family aware of need for  24/7 Support at DC home after short CIR stay. Family plans to financially cover assistance. Will admit to inpatient rehab today.  Preadmission Screen Completed By:  Jhonnie Garner, 01/23/2018 4:26 PM ______________________________________________________________________   Discussed status with Dr. Posey Pronto on 01/23/18 at 5:11PM and received telephone approval for admission today.  Admission Coordinator:  Jhonnie Garner, time 5:11PM Sudie Grumbling 01/23/18.            Cosigned by: Jamse Arn, MD at 01/23/2018 5:16 PM  Revision History

## 2018-01-23 NOTE — Progress Notes (Signed)
Physical Therapy Treatment Patient Details Name: Christian Maxwell MRN: [id number] DOB: [birth year] Today's Date: 01/23/2018    History of Present Illness Pt is an 84 y.o. male admitted 01/20/18 with c/o chest pain. Family shares that pt having ataxia, frequent falls and difficulty feeding self of late. CT and MRI show 7 mm hemorrhage in the left thalamus. PMH includes HTN, colon CA, a-fib.   PT Comments    Pt progressing with mobility. Demonstrates slowed processing and decreased safety awareness. ModA for bed mobility and transfers; ambulating short distance requiring minA to prevent LOB with turns. Demonstrates poor balance strategies/postural awareness. Pt with poor insight into deficits. Continue to recommend intensive CIR-level therapies to maximize functional mobility and independence. Will follow acutely.   Follow Up Recommendations  CIR;Supervision/Assistance - 24 hour     Equipment Recommendations  None recommended by PT    Recommendations for Other Services       Precautions / Restrictions Precautions Precautions: Fall Precaution Comments: Chest pain Restrictions Weight Bearing Restrictions: No    Mobility  Bed Mobility Overal bed mobility: Needs Assistance Bed Mobility: Supine to Sit     Supine to sit: Mod assist;HOB elevated     General bed mobility comments: ModA for UE support to assist trunk elevation. Pt c/o chest pain upon sitting which resolved during rest of session (RN aware)  Transfers Overall transfer level: Needs assistance Equipment used: Rolling walker (2 wheeled) Transfers: Sit to/from Stand Sit to Stand: Mod assist         General transfer comment: ModA to assist trunk elevation; pt not putting hands correctly on RW despite education  Ambulation/Gait Ambulation/Gait assistance: Min guard;Min assist   Assistive device: Rolling walker (2 wheeled) Gait Pattern/deviations: Step-through pattern;Decreased stride length;Trunk flexed Gait  velocity: Decreased Gait velocity interpretation: <1.8 ft/sec, indicate of risk for recurrent falls General Gait Details: Close min guard for balance amb with RW. Pt picking up walker and turning abruptly with all direction changes requiring minA to maintain balance   Stairs             Wheelchair Mobility    Modified Rankin (Stroke Patients Only) Modified Rankin (Stroke Patients Only) Pre-Morbid Rankin Score: No significant disability Modified Rankin: Moderately severe disability     Balance Overall balance assessment: Needs assistance   Sitting balance-Leahy Scale: Fair     Standing balance support: Bilateral upper extremity supported;During functional activity;Single extremity supported Standing balance-Leahy Scale: Poor Standing balance comment: Reliant on UE support with standing tasks                            Cognition Arousal/Alertness: Awake/alert Behavior During Therapy: Flat affect Overall Cognitive Status: Impaired/Different from baseline Area of Impairment: Attention;Memory;Following commands;Safety/judgement;Awareness;Problem solving                   Current Attention Level: Sustained Memory: Decreased short-term memory Following Commands: Follows one step commands inconsistently Safety/Judgement: Decreased awareness of safety;Decreased awareness of deficits Awareness: Emergent Problem Solving: Slow processing;Difficulty sequencing;Requires verbal cues General Comments: Daughter present and reports cognition seems slower than normal. Reports stubborness/personality seems similar to baseline. Very poor safety awareness throughout session; required max encouragement to attempt to do things himself.       Exercises      General Comments General comments (skin integrity, edema, etc.): Daughter (Christian Maxwell) present during session      Pertinent Vitals/Pain Pain Assessment: Faces Faces Pain Scale: Hurts little more Pain  Location: L  shoulder/scapula Pain Descriptors / Indicators: Guarding;Grimacing Pain Intervention(s): Monitored during session;Limited activity within patient's tolerance    Home Living Family/patient expects to be discharged to:: Private residence Living Arrangements: Alone Available Help at Discharge: Family;Available PRN/intermittently Type of Home: House Home Access: Stairs to enter Entrance Stairs-Rails: Right;Left Home Layout: Two level;Able to live on main level with bedroom/bathroom Home Equipment: Gilford Rile - 2 wheels      Prior Function Level of Independence: Independent      Comments: Per daughter, pt recently shared with family regarding falls at home. Pt reports 2 falls in the past two months   PT Goals (current goals can now be found in the care plan section) Acute Rehab PT Goals Patient Stated Goal: Return home PT Goal Formulation: With patient/family Time For Goal Achievement: 02/05/18 Potential to Achieve Goals: Fair Progress towards PT goals: Progressing toward goals    Frequency    Min 4X/week      PT Plan Current plan remains appropriate    Co-evaluation              AM-PAC PT "6 Clicks" Daily Activity  Outcome Measure  Difficulty turning over in bed (including adjusting bedclothes, sheets and blankets)?: Unable Difficulty moving from lying on back to sitting on the side of the bed? : Unable Difficulty sitting down on and standing up from a chair with arms (e.g., wheelchair, bedside commode, etc,.)?: Unable Help needed moving to and from a bed to chair (including a wheelchair)?: A Little Help needed walking in hospital room?: A Little Help needed climbing 3-5 steps with a railing? : A Lot 6 Click Score: 11    End of Session Equipment Utilized During Treatment: Gait belt Activity Tolerance: Patient tolerated treatment well Patient left: in bed;with call bell/phone within reach;with family/visitor present(insistent on return to bed instead of  recliner) Nurse Communication: Mobility status PT Visit Diagnosis: Other abnormalities of gait and mobility (R26.89)     Time: 0355-9741 PT Time Calculation (min) (ACUTE ONLY): 12 min  Charges:  $Gait Training: 8-22 mins                    Mabeline Caras, PT, DPT Acute Rehabilitation Services  Pager [phone] Office [place] 01/23/2018, 2:47 PM

## 2018-01-23 NOTE — Progress Notes (Signed)
Inpatient Rehabilitation-Admissions Coordinator   Met with pt and his two daughters today to discuss recommendation for CIR. Provided brochures and information regarding program. AC discussed recommended assistance level after CIR (24/7 Supervision due to safety concerns) with family questioning whether that is possible monetarily. Family is checking info financial situation/insurance policy and will have an answer tomorrow to determine if pt is more appropriate for CIR vs SNF.   AC will follow up with family tomorrow for decision.   Please call if questions.   Jhonnie Garner, OTR/L  Rehab Admissions Coordinator  [phone] 01/23/2018 2:42 PM

## 2018-01-23 NOTE — Discharge Summary (Signed)
Physician Discharge Summary  Christian Maxwell BMW:[id number] DOB: July 18, 1932 DOA: 01/20/2018  PCP: Ria Bush, MD  Admit date: 01/20/2018 Discharge date: 01/23/2018  Admitted From: Home  Disposition:  Inpatient rehab.   Recommendations for Outpatient Follow-up and new medication changes:  1. Follow up with Dr. Danise Mina in 7 days 2. Aspirin and warfarin have been discontinued 3. Follow-up head CT in 2 weeks after discharge, if hemorrhagic resolved to consider direct oral anticoagulation. 4. To continue incentive spirometry to prevent atelectasis.  Home Health: Na  Equipment/Devices: Na   Discharge Condition: stable  CODE STATUS:full  Diet recommendation: Heart healthy   Brief/Interim Summary: 84 year old male who presented with chest pain.  He does have the significant past medical history for chronic atrial fibrillation and coronary artery disease.  He reported acute episode of chest pain occurring around 3 AM in the morning while trying to use the restroom.  The pain was worse with deep inspiration. On his initial physical examination blood pressure 121/74, heart rate 73, respiratory rate 23-31, temperature 98.3, oxygen saturation 98%.  Moist mucous membranes, lungs with decreased breath sounds at bases, heart S1-S2 present, irregularly irregular, abdomen soft and nontender, no lower extremity edema.  Sodium 137, potassium 3.7, chloride 103, bicarb 23, glucose 149, BUN 18, creatinine 0.93, BNP 299, troponin I 0.06, white count 14.0, hemoglobin 15.5, hematocrit 48.7, platelets 173, INR 2.1.  Chest CT negative for pulmonary embolism, bilateral dependent atelectasis.  Chest radiograph with bibasilar atelectasis.  EKG had atrial fibrillation, left axis deviation, left bundle branch block.  Patient was admitted to the hospital with a working diagnosis of atypical chest pain to rule out acute coronary syndrome.  On further history apparently the patient showed worsening weakness and  inability to feed himself, work-up with a head CT showed 7 mm hemorrhage in the left thalamus.  1. Intracerebral hemorrhage.  Repeat CT head in 24 hours showed stable intra-cerebral hemorrhage at the left thalamus, 7 mm.  Further work-up with brain MRI showed stable left thalamic hemorrhage, 7 mm.  Patient had frequent neuro checks, physical therapy evaluation, recommendations to discharge to inpatient rehab.  All anticoagulants were held, patient was seen by neurology with recommendations to repeat head CT in 2 weeks.  If hemorrhage resolve may resume anticoagulation with direct oral anticoagulants.   2. Chronic atrial fibrillation. Ramained rate control with metoprolol 25 mg po bid.  Patient will benefit from anticoagulation once hemorrhage resolves.  3. Acute hypoxic respiratory failure due to right lower lobe infiltrates, likely atelectasis, to rule out pulmonary edema.  Improve oxygenation and wear supplemental oxygen, further radiographic surveillance showed atelectasis, no pulmonary edema or infectious process.  Discharge oximetry 91% on room air.  Continue incentive spirometer.  4. Ataxia and ambulatory dysfunction. Patient will need inpatient rehab. Continue physical therapy and out of bed as tolerated.   5. Dementia. Continue donepezil.   Discharge Diagnoses:  Principal Problem:   Bleeding in brain [facility]) Active Problems:   Atrial fibrillation ([place])   Memory impairment   Hearing impairment   Chest pain   Ataxia   Hypoxia   Dementia without behavioral disturbance    Discharge Instructions  Discharge Instructions    Ambulatory referral to Neurology   Complete by:  As directed    Follow up with stroke clinic NP (Jessica Vanschaick or Cecille Rubin, if both not available, consider Dr. Antony Contras, Dr. Bess Harvest, or Dr. Sarina Ill) at [facility] in about 4 weeks.  Needs CT of head in  2 weeks with MD review for restarting anticoagulation. Patient  may go to [place] to d/c home and they can do CT - waiting on final decision.     Allergies as of 01/23/2018   No Known Allergies     Medication List    STOP taking these medications   aspirin 81 MG tablet   mupirocin cream 2 % Commonly known as:  BACTROBAN   tizanidine 2 MG capsule Commonly known as:  ZANAFLEX   warfarin 5 MG tablet Commonly known as:  COUMADIN     TAKE these medications   ARICEPT 10 MG tablet Generic drug:  donepezil Take 10 mg by mouth at bedtime.   DRY EYES OP Apply 1 drop to eye as needed (dry eyes).   fish oil-omega-3 fatty acids 1000 MG capsule Take 1 g by mouth daily.   folic acid 1 MG tablet Commonly known as:  FOLVITE Take 1 mg by mouth daily.   metoprolol tartrate 25 MG tablet Commonly known as:  LOPRESSOR TAKE 1 TABLET BY MOUTH TWICE DAILY   nitroGLYCERIN 0.4 MG SL tablet Commonly known as:  NITROSTAT Place 1 tablet (0.4 mg total) under the tongue every 5 (five) minutes as needed for chest pain.   simvastatin 40 MG tablet Commonly known as:  ZOCOR Take 1 tablet (40 mg total) by mouth every morning. Patient needs to call and schedule an appointment for further refills 1st attempt   VITAMIN B-1 PO Take 1 tablet by mouth daily.   zolpidem 10 MG tablet Commonly known as:  AMBIEN Take 1 tablet (10 mg total) by mouth at bedtime as needed for sleep.      Follow-up Information    [facility] Follow up in 4 week(s).   Specialty:  Neurology Why:  stroke clinic. office will call wtih appt date and time Contact information: [address], [place], [place] Follow up on 02/15/2018.   Specialty:  Cardiology Why:  8:30 am for hospital follow up Contact information: [place] [address] [phone]          No Known Allergies  Consultations:  Cardiology   Neurology    Procedures/Studies: Dg Chest 2  View  Result Date: 01/23/2018 CLINICAL DATA:  Dyspnea EXAM: CHEST - 2 VIEW COMPARISON:  CT chest 01/21/2016.  Chest x-ray 01/20/2018. FINDINGS: Low lung volumes. Interstitial markings are diffusely coarsened with chronic features. The cardio pericardial silhouette is enlarged. Basilar opacity evident on lateral film compatible with atelectasis noted on yesterday's CT. Old left clavicle fracture evident. Telemetry leads overlie the chest. IMPRESSION: Posterior basilar opacity likely related to atelectasis as noted on yesterday's CT scan. Electronically Signed   By: Misty Stanley M.D.   On: 01/23/2018 10:32   Dg Chest 2 View  Result Date: 01/20/2018 CLINICAL DATA:  Left chest pressure and shortness of breath EXAM: CHEST - 2 VIEW COMPARISON:  04/03/2016 FINDINGS: Mild enlargement of the cardiopericardial silhouette. Tortuous thoracic aorta. Atherosclerotic calcification of the aortic arch. Low lung volumes are present, causing crowding of the pulmonary vasculature. Bibasilar linear opacities favoring mild atelectasis. Hazy density along the lower lobes primarily on the lateral projection is partially due to crowding of pulmonary vasculature but a component of ground-glass density or interstitial accentuation is difficult to exclude based on the lateral projection, and is primarily retro diaphragmatic. IMPRESSION: 1. Indistinct lower lobe opacities best seen on the  lateral projection, potentially from alveolitis, low-grade edema, or atypical infectious process. There is also some mild bibasilar atelectasis. 2. Mild enlargement of the cardiopericardial silhouette. 3.  Aortic Atherosclerosis (ICD10-I70.0). 4. Low lung volumes are present, causing crowding of the pulmonary vasculature. Electronically Signed   By: Van Clines M.D.   On: 01/20/2018 15:01   Dg Wrist Complete Left  Result Date: 01/21/2018 CLINICAL DATA:  Pain in the dorsal radial side of the wrist for 6 months EXAM: LEFT WRIST - COMPLETE 3+ VIEW  COMPARISON:  None. FINDINGS: No acute displaced fracture or malalignment. Moderate arthritis at the first [facility] joint. Mild arthritis at the distal radiocarpal joint. Small cysts in the scaphoid bone. IMPRESSION: 1. No acute osseous abnormality. 2. Arthritis at the first [facility] joint and distal radial carpal joint Electronically Signed   By: Donavan Foil M.D.   On: 01/21/2018 14:46   Ct Head Wo Contrast  Result Date: 01/22/2018 CLINICAL DATA:  Hemorrhagic stroke follow-up. EXAM: CT HEAD WITHOUT CONTRAST TECHNIQUE: Contiguous axial images were obtained from the base of the skull through the vertex without intravenous contrast. COMPARISON:  CT head from yesterday. FINDINGS: Brain: Unchanged 7-8 mm hemorrhage in the left thalamus. No new hemorrhage. No significant mass effect. Minimal surrounding edema is unchanged. No hydrocephalus or extra-axial collection. Stable mild cerebral atrophy and chronic microvascular ischemic changes. Vascular: Atherosclerotic vascular calcification of the carotid siphons. No hyperdense vessel. Skull: Negative for fracture or focal lesion. Sinuses/Orbits: No acute finding. Other: None. IMPRESSION: 1. Unchanged small 7-8 mm hemorrhage in the left thalamus. No new hemorrhage or progressive mass effect. Electronically Signed   By: Titus Dubin M.D.   On: 01/22/2018 08:06   Ct Head Wo Contrast  Result Date: 01/21/2018 CLINICAL DATA:  Neurologic change. EXAM: CT HEAD WITHOUT CONTRAST TECHNIQUE: Contiguous axial images were obtained from the base of the skull through the vertex without intravenous contrast. COMPARISON:  None. FINDINGS: Brain: Small hemorrhage in the left thalamus. No evidence of significant mass effect. Mild brain parenchymal volume loss and periventricular microangiopathy. Vascular: Calcific atherosclerotic disease at the skull base. Skull: Normal. Negative for fracture or focal lesion. Sinuses/Orbits: No acute finding. Other: None. IMPRESSION: 7 mm hemorrhage in the left  thalamus. No evidence of mass effect. Moderate brain parenchymal volume loss and microangiopathy. These results were called by telephone at the time of interpretation on 01/21/2018 at 11:13 am to Dr. Evangeline Gula, who verbally acknowledged these results. Electronically Signed   By: Fidela Salisbury M.D.   On: 01/21/2018 11:17   Ct Angio Chest Pe W And/or Wo Contrast  Result Date: 01/20/2018 CLINICAL DATA:  Acute onset chest pressure this morning with shortness of breath. Taking Coumadin for atrial fibrillation. EXAM: CT ANGIOGRAPHY CHEST WITH CONTRAST TECHNIQUE: Multidetector CT imaging of the chest was performed using the standard protocol during bolus administration of intravenous contrast. Multiplanar CT image reconstructions and MIPs were obtained to evaluate the vascular anatomy. CONTRAST:  159mL ISOVUE-370 IOPAMIDOL (ISOVUE-370) INJECTION 76% COMPARISON:  Chest radiographs obtained today. Chest CT dated 08/24/2004. Chest radiographs dated 04/03/2016. FINDINGS: Cardiovascular: Mild atheromatous arterial calcifications, including coronary artery and aortic calcifications. Enlarged heart. Normally opacified pulmonary arteries with no pulmonary arterial filling defects seen. Mediastinum/Nodes: No enlarged mediastinal, hilar, or axillary lymph nodes. Thyroid gland, trachea, and esophagus demonstrate no significant findings. Lungs/Pleura: Mild bilateral dependent atelectasis, corresponding to the densities seen on the radiographs earlier today. Upper Abdomen: Partially included gallbladder on the last images, filled with multiple small stones, measuring up to 3  mm in diameter each. No visible gallbladder wall thickening or pericholecystic fluid. Musculoskeletal: Old, healed right posterior rib fractures. No acute fracture seen. Approximately 10% T1 vertebral superior endplate compression deformity with minimal bony retropulsion, sclerosis and no acute fracture lines. Approximately 40% T4 vertebral compression  deformity with no bony retropulsion and no acute fracture lines. The T4 vertebral body compression deformity is unchanged since 04/03/2016. The T1 level was obscured by the shoulders at that time. Review of the MIP images confirms the above findings. IMPRESSION: 1. No pulmonary emboli. 2. Mild bilateral dependent atelectasis. 3. Cholelithiasis. 4. Old T1 and T4 vertebral compression fractures. 5. Mild calcific coronary artery and aortic atherosclerosis. Aortic Atherosclerosis (ICD10-I70.0). Electronically Signed   By: Claudie Revering M.D.   On: 01/20/2018 16:24   Mr Jeri Cos NA Contrast  Result Date: 01/22/2018 CLINICAL DATA:  Cerebral hemorrhage EXAM: MRI HEAD WITHOUT AND WITH CONTRAST TECHNIQUE: Multiplanar, multiecho pulse sequences of the brain and surrounding structures were obtained without and with intravenous contrast. CONTRAST:  6 mL Gadovist IV COMPARISON:  CT head 01/22/2018 FINDINGS: Brain: Left thalamic hemorrhage 7 mm unchanged from CT. No other areas of hemorrhage. Negative for acute infarct. Negative for mass lesion. No enhancing mass in the left thalamus. Marked temporal lobe atrophy involving the hippocampus. Question dementia. Mild chronic microvascular ischemic change in the white matter. Normal enhancement postcontrast administration. Vascular: Normal arterial flow void Skull and upper cervical spine: Negative Sinuses/Orbits: Mild mucosal edema paranasal sinuses. Bilateral cataract surgery Other: None IMPRESSION: 7 mm acute hemorrhage left thalamus unchanged from CT. No underlying ischemic infarction or mass lesion. Cerebral atrophy most prominent the temporal lobes. Marked hippocampal atrophy bilaterally. Question dementia. Mild chronic microvascular ischemia in the white matter. Electronically Signed   By: Franchot Gallo M.D.   On: 01/22/2018 10:22       Subjective: Patient this am reports dyspnea, no chest pain, no nausea or vomiting, has been working with physical therapy.    Discharge Exam: Vitals:   01/23/18 0850 01/23/18 1229  BP: (!) 104/93 124/70  Pulse: 80 80  Resp: 18 (!) 30  Temp: 97.7 F (36.5 C) 97.6 F (36.4 C)  SpO2: 95% 91%   Vitals:   01/22/18 1933 01/23/18 0229 01/23/18 0850 01/23/18 1229  BP: 129/68 128/77 (!) 104/93 124/70  Pulse: 80 69 80 80  Resp: (!) 22 (!) 21 18 (!) 30  Temp: 98.2 F (36.8 C)  97.7 F (36.5 C) 97.6 F (36.4 C)  TempSrc: Oral  Axillary Oral  SpO2: 95% 92% 95% 91%  Weight:  79.7 kg    Height:        General: deconditioned  Neurology: Awake and alert, non focal  E ENT: mild pallor, no icterus, oral mucosa moist Cardiovascular: No JVD. S1-S2 present, irregularly irregular, no gallops, rubs, or murmurs. No lower extremity edema. Pulmonary: vesicular breath sounds bilaterally, adequate air movement, no wheezing, rhonchi or rales. Gastrointestinal. Abdomen flat, no organomegaly, non tender, no rebound or guarding Skin. No rashes Musculoskeletal: no joint deformities    The results of significant diagnostics from this hospitalization (including imaging, microbiology, ancillary and laboratory) are listed below for reference.     Microbiology: No results found for this or any previous visit (from the past 240 hour(s)).   Labs: BNP (last 3 results) Recent Labs    01/20/18 1445 01/21/18 0157  BNP 299.1* 355.7*   Basic Metabolic Panel: Recent Labs  Lab 01/20/18 1445 01/23/18 0949  NA 137 137  K 3.7  3.7  CL 103 98  CO2 23 28  GLUCOSE 149* 166*  BUN 18 26*  CREATININE 0.93 0.95  CALCIUM 9.7 10.0   Liver Function Tests: No results for input(s): AST, ALT, ALKPHOS, BILITOT, PROT, ALBUMIN in the last 168 hours. No results for input(s): LIPASE, AMYLASE in the last 168 hours. No results for input(s): AMMONIA in the last 168 hours. CBC: Recent Labs  Lab 01/20/18 1445  WBC 14.0*  NEUTROABS 10.8*  HGB 15.5  HCT 48.7  MCV 88.5  PLT 173   Cardiac Enzymes: Recent Labs  Lab 01/20/18 1955  01/20/18 2304 01/21/18 0157  TROPONINI 0.06* 0.05* 0.04*   BNP: Invalid input(s): POCBNP CBG: Recent Labs  Lab 01/22/18 1027  GLUCAP 166*   D-Dimer No results for input(s): DDIMER in the last 72 hours. Hgb A1c Recent Labs    01/22/18 0642  HGBA1C 5.9*   Lipid Profile Recent Labs    01/22/18 0642  CHOL 216*  HDL 45  LDLCALC 143*  TRIG 138  CHOLHDL 4.8   Thyroid function studies No results for input(s): TSH, T4TOTAL, T3FREE, THYROIDAB in the last 72 hours.  Invalid input(s): FREET3 Anemia work up No results for input(s): VITAMINB12, FOLATE, FERRITIN, TIBC, IRON, RETICCTPCT in the last 72 hours. Urinalysis    Component Value Date/Time   COLORURINE YELLOW 07/17/2012 0950   APPEARANCEUR CLEAR 07/17/2012 0950   LABSPEC 1.020 07/17/2012 0950   PHURINE 6.5 07/17/2012 0950   GLUCOSEU NEG 07/17/2012 0950   HGBUR NEG 07/17/2012 0950   BILIRUBINUR NEG 07/17/2012 0950   KETONESUR NEG 07/17/2012 0950   PROTEINUR NEG 07/17/2012 0950   UROBILINOGEN 0.2 07/17/2012 0950   NITRITE NEG 07/17/2012 0950   LEUKOCYTESUR NEG 07/17/2012 0950   Sepsis Labs Invalid input(s): PROCALCITONIN,  WBC,  LACTICIDVEN Microbiology No results found for this or any previous visit (from the past 240 hour(s)).   Time coordinating discharge: 45 minutes  SIGNED:   Tawni Millers, MD  [facility] 01/23/2018, 4:17 PM Pager [phone]  If 7PM-7AM, please contact night-coverage [email or web address] Password [email or web address]

## 2018-01-23 NOTE — Care Management Note (Signed)
Case Management Note  Patient Details  Name: GHAZI RUMPF MRN: [id number] Date of Birth: April 17, 1933  Subjective/Objective:  Pt presented for chest pain. PTA from home alone. Daughter in [place]- PT consulted and recommendations for CIR. [place] Coordinator with CIR plan to admit 01-23-18.                   Action/Plan: No further needs from CM at this time.   Expected Discharge Date:  01/22/18               Expected Discharge Plan:  [place]  In-House Referral:  NA  Discharge planning Services  CM Consult  Post Acute Care Choice:  NA Choice offered to:  NA  DME Arranged:  N/A DME Agency:  NA  HH Arranged:  NA HH Agency:  NA  Status of Service:  Completed, signed off  If discussed at [place] of Stay Meetings, dates discussed:    Additional Comments:  Bethena Roys, RN 01/23/2018, 4:26 PM

## 2018-01-23 NOTE — Plan of Care (Signed)
  Problem: Activity: Goal: Ability to return to baseline activity level will improve Outcome: Progressing   Problem: Activity: Goal: Risk for activity intolerance will decrease Outcome: Progressing   

## 2018-01-23 NOTE — Evaluation (Signed)
Occupational Therapy Evaluation Patient Details Name: Christian Maxwell MRN: [id number] DOB: 23-Apr-1933 Today's Date: 01/23/2018    History of Present Illness Pt is an 84 y.o. male admitted 01/20/18 with c/o chest pain. Family shares that pt having ataxia, frequent falls and difficulty feeding self of late. CT and MRI show 7 mm hemorrhage in the left thalamus. PMH includes HTN, colon CA, a-fib.   Clinical Impression   PTA, pt was living alone and was performing ADLs and light IADLs. Pt currently requiring Min A for UB ADLs, Mod-Max A for LB ADLs, and Min-Mod A for functional mobility with RW. Pt presenting with decreased balance, strength, functional use of BUEs due to limited ROM with pain, and safety awareness. Pt requiring increased encouragement and cues throughout session. Pt with good family support. Pt would benefit from further acute OT to facilitate safe dc. Recommend dc to post-acute rehab for further OT to optimize safety, independence with ADLs, and return to PLOF.      Follow Up Recommendations  CIR;Supervision/Assistance - 24 hour    Equipment Recommendations  Other (comment)(Defer to next venue)    Recommendations for Other Services PT consult     Precautions / Restrictions Precautions Precautions: Fall Precaution Comments: Chest pain Restrictions Weight Bearing Restrictions: No      Mobility Bed Mobility Overal bed mobility: Needs Assistance Bed Mobility: Supine to Sit     Supine to sit: Mod assist;HOB elevated     General bed mobility comments: ModA for UE support to assist trunk elevation. Pt c/o chest pain upon sitting which resolved during rest of session (RN aware)  Transfers Overall transfer level: Needs assistance Equipment used: Rolling walker (2 wheeled) Transfers: Sit to/from Stand Sit to Stand: Mod assist         General transfer comment: ModA to assist trunk elevation; pt not putting hands correctly on RW despite education    Balance  Overall balance assessment: Needs assistance   Sitting balance-Leahy Scale: Fair     Standing balance support: Bilateral upper extremity supported;During functional activity;Single extremity supported Standing balance-Leahy Scale: Poor Standing balance comment: Reliant on UE support with standing tasks                           ADL either performed or assessed with clinical judgement   ADL Overall ADL's : Needs assistance/impaired Eating/Feeding: Set up;Supervision/ safety;Sitting   Grooming: Oral care;Standing;Minimal assistance Grooming Details (indicate cue type and reason): Min A for standing balance at sink during oral care. Pt requestign OT to place tooth paste on tooth brush stating "I can't do this." When OT encouraged pt to perform tasks at his best ability, pt compelted oral care without need for assists during FM tasks. Pt stating "I don't know why they are sending children in here. I need an adult to help me." Upper Body Bathing: Minimal assistance;Sitting   Lower Body Bathing: Sit to/from stand;Moderate assistance Lower Body Bathing Details (indicate cue type and reason): Mod A for standing Upper Body Dressing : Minimal assistance;Sitting   Lower Body Dressing: Maximal assistance;Sit to/from stand Lower Body Dressing Details (indicate cue type and reason): Pt stating he cannot put on his socks and requested OT do it. Encouraging pt to perform task and he became frustated.  Toilet Transfer: Minimal assistance;Ambulation;RW;Moderate assistance(Simulated in room)           Functional mobility during ADLs: Minimal assistance;Rolling walker General ADL Comments: Pt presenting with decreased balance and  required increased assistance for safety. Pt with single LOB during dynamic balance tasks and reuqired Mod A.     Vision         Perception     Praxis      Pertinent Vitals/Pain Pain Assessment: Faces Faces Pain Scale: Hurts little more Pain Location: L  shoulder/scapula Pain Descriptors / Indicators: Guarding;Grimacing Pain Intervention(s): Monitored during session;Limited activity within patient's tolerance;Repositioned     Hand Dominance Right   Extremity/Trunk Assessment Upper Extremity Assessment Upper Extremity Assessment: Generalized weakness;LUE deficits/detail;RUE deficits/detail RUE Deficits / Details: Pt reporting he is unable to perform forward flexion of shoulder due to pain stating "I can't use that hand." Pt WFL during oral care task RUE: Unable to fully assess due to pain LUE Deficits / Details: LUE pain and ROM since fall at home, difficult to assess true function due to pt guarding and not following commands consistently. Decrease shoulder flexion/abd seems to stem from limited scap ROM LUE Coordination: decreased gross motor   Lower Extremity Assessment Lower Extremity Assessment: Generalized weakness   Cervical / Trunk Assessment Cervical / Trunk Assessment: Kyphotic   Communication Communication Communication: HOH   Cognition Arousal/Alertness: Awake/alert Behavior During Therapy: Flat affect Overall Cognitive Status: Impaired/Different from baseline Area of Impairment: Attention;Memory;Following commands;Safety/judgement;Awareness;Problem solving                   Current Attention Level: Sustained Memory: Decreased short-term memory Following Commands: Follows one step commands inconsistently Safety/Judgement: Decreased awareness of safety;Decreased awareness of deficits Awareness: Emergent Problem Solving: Slow processing;Difficulty sequencing;Requires verbal cues General Comments: Daughter present and reports cognition seems slower than normal. Reports stubborness/personality seems similar to baseline. Very poor safety awareness throughout session; required max encouragement to attempt to do things himself. Pt stating "I can't do this" when he does want to do something.   General Comments  VSS  throughout session. Daughter (tammy) arriving at end of session. Answering questions about rehab.    Exercises     Shoulder Instructions      Home Living Family/patient expects to be discharged to:: Private residence Living Arrangements: Alone Available Help at Discharge: Family;Available PRN/intermittently Type of Home: House Home Access: Stairs to enter [employer] of Steps: 5 Entrance Stairs-Rails: Right;Left Home Layout: Two level;Able to live on main level with bedroom/bathroom               Home Equipment: Gilford Rile - 2 wheels      Lives With: Alone    Prior Functioning/Environment Level of Independence: Independent        Comments: Per daughter, pt recently shared with family regarding falls at home. Pt reports 2 falls in the past two months        OT Problem List: Decreased strength;Decreased range of motion;Decreased activity tolerance;Impaired balance (sitting and/or standing);Decreased cognition;Decreased safety awareness;Decreased knowledge of use of DME or AE;Decreased knowledge of precautions;Pain      OT Treatment/Interventions: Self-care/ADL training;Therapeutic exercise;Energy conservation;DME and/or AE instruction;Therapeutic activities;Patient/family education    OT Goals(Current goals can be found in the care plan section) Acute Rehab OT Goals Patient Stated Goal: Return home OT Goal Formulation: With patient/family Time For Goal Achievement: 02/06/18 Potential to Achieve Goals: Good ADL Goals Pt Will Perform Grooming: with set-up;standing;with supervision Pt Will Perform Upper Body Dressing: with set-up;with supervision;sitting Pt Will Perform Lower Body Dressing: with set-up;with supervision;sit to/from stand Pt Will Transfer to Toilet: with set-up;with supervision;bedside commode;ambulating Pt Will Perform Toileting - Clothing Manipulation and hygiene: with set-up;with supervision;sit  to/from stand  OT Frequency: Min 2X/week    Barriers to D/C:            Co-evaluation              AM-PAC PT "6 Clicks" Daily Activity     Outcome Measure Help from another person eating meals?: None Help from another person taking care of personal grooming?: A Little Help from another person toileting, which includes using toliet, bedpan, or urinal?: A Lot Help from another person bathing (including washing, rinsing, drying)?: A Lot Help from another person to put on and taking off regular upper body clothing?: A Little Help from another person to put on and taking off regular lower body clothing?: A Lot 6 Click Score: 16   End of Session Equipment Utilized During Treatment: Gait belt;Rolling walker Nurse Communication: Mobility status  Activity Tolerance: Patient limited by pain Patient left: in bed;with call bell/phone within reach;with bed alarm set;with family/visitor present  OT Visit Diagnosis: Unsteadiness on feet (R26.81);Other abnormalities of gait and mobility (R26.89);Muscle weakness (generalized) (M62.81);Other symptoms and signs involving cognitive function;Pain Pain - part of body: (Chest pain)                Time: 7622-6333 OT Time Calculation (min): 13 min Charges:  OT General Charges $OT Visit: 1 Visit OT Evaluation $OT Eval Moderate Complexity: [place], OTR/L Acute Rehab Pager: [phone] Office: [place] 01/23/2018, 4:23 PM

## 2018-01-23 NOTE — H&P (Signed)
Physical Medicine and Rehabilitation Admission H&P    Chief Complaint  Patient presents with  . Chest Pain  . Shortness of Breath  : HPI: Christian Maxwell is an 84 year old right-handed male with history of dementia maintained on Aricept.  Chronic atrial fibrillation maintained on Coumadin, colon cancer, hypertension.  Per chart review, patient, and family, lives alone reported to be independent driving short distances.  2 level home bedroom upstairs.  Family in the vicinity question assistance on discharge.  There is been reports of 2 falls in the past couple of months.  Presented 01/21/2018 with nonspecific chest pain, dizziness and generalized weakness.  CT reviewed, showing left thalamic hemorrhage. Per report and MRI, 7 mm acute left thalamic hemorrhage.  No underlying ischemic infarct or mass lesion.  Echocardiogram with ejection fraction of 65% no wall motion abnormalities.  Troponin 0.06, INR 2.02.  Coumadin held due to bleed.  Carotid Dopplers with no ICA stenosis.  Cardiology follow-up for mildly elevated troponin nonspecific chest pain did not feel to be acute coronary syndrome and monitored.  In regards to anticoagulation neurology recommends hold on anticoagulation due to [place] repeat CT scan of the head in 2 weeks and if ICH resolved may resume anticoagulation antiplatelet therapy.  Tolerating a regular diet.  Physical and occupational therapy evaluations completed with recommendations of physical medicine rehab consult.  Patient was admitted for a comprehensive rehab program.  Review of Systems  Constitutional: Negative for chills and fever.  HENT: Negative for hearing loss.   Eyes: Negative for blurred vision and double vision.  Respiratory: Negative for cough and shortness of breath.   Cardiovascular: Negative for leg swelling.  Gastrointestinal: Positive for constipation. Negative for nausea and vomiting.  Genitourinary: Positive for urgency.  Musculoskeletal: Positive for falls  and myalgias.  Skin: Negative for rash.  Neurological: Positive for focal weakness.  Psychiatric/Behavioral: Positive for memory loss.  All other systems reviewed and are negative.  Past Medical History:  Diagnosis Date  . Abscess 7/13   abd abscess  . Cancer [facility])    colon cancer  . Chronic atrial fibrillation ([place])   . History of colon cancer 2000   T3N0 s/p colectomy  . History of echocardiogram    a. Echo 2/13:  Mild LVH, EF 55-60%, mild MR, moderate LAE, mild to moderate RAE;  b. Echo 1/17: mild LVH, vigorous LVF, EF 65-70%, no RWMA, trivial AI, trivial MR, severe LAE, mild RAE, mod TR, PASP 32 mmHg  . History of stress test    a. Cardiolite in 9/04: EF 67%, no ischemia.;  b. Myoview 6/16: no ischemia   . Hx of adenomatous colonic polyps   . Hyperlipidemia   . Hypertension   . METHICILLIN RESISTANT STAPHYLOCOCCUS AUREUS INFECTION 12/12/2006   Annotation: stitch abcess in lower abdomen after colon cancer resection Qualifier: Diagnosis of  By: [name] MD, [name]    . Myocardial infarction ([place]) 1985  . Postoperative stitch abscess 07/19/2011  . PVC's (premature ventricular contractions)   . Syncope and collapse    pt denies  . Thyroid disease    followed by [name]   Past Surgical History:  Procedure Laterality Date  . COLECTOMY  2000   6 inches removed  . COLONOSCOPY  2009   polyp, h/o cancer [name])  . EYE SURGERY     cataract surgery both eyes- pts ates he has never had cataract surgery 10-26-15  . HERNIA REPAIR  05/5398   DB umbilical hernia  .  INCISE AND DRAIN ABCESS  2009,2010,2011,2012  . INCISION AND DRAINAGE Right 04/02/2013   Procedure: INCISION AND DRAINAGE RIGHT KNEE HEMATOMA;  Surgeon: [name], MD;  Location: [place];  Service: Orthopedics;  Laterality: Right;  . INCISION AND DRAINAGE ABSCESS N/A 09/20/2012   Procedure: INCISION AND DRAINAGE SUPRAPUBIC ABSCESS;  Surgeon: [name], MD;  Location: [place];  Service:  General;  Laterality: N/A;  . INCISION AND DRAINAGE ABSCESS Left 02/28/2013   Procedure: INCISION AND DRAINAGE LEFT ARM ABSCESS;  Surgeon: [name], MD;  Location: [place];  Service: General;  Laterality: Left;  . LUMBAR LAMINECTOMY  1977  . MASS EXCISION  03/07/2012   EXCISION MASS;  Surgeon: [name], MD;  Laterality: Right;  removal mass posterior right arm  . POLYPECTOMY    . WOUND EXPLORATION  2006   For possible stitch abscess Dr [name]   Family History  Problem Relation Age of Onset  . Stroke Mother   . Stroke Father   . Hypertension Father   . CAD Father 85       MI  . Dementia Father   . Stroke Sister 74  . Stroke Brother   . Stroke Brother   . Diabetes Brother   . CAD Brother 32       MI  . Dementia Sister 48  . Dementia Brother   . Colon cancer Cousin        x4 total with colon cancer  . Cancer Neg Hx   . Colon polyps Neg Hx   . Rectal cancer Neg Hx   . Stomach cancer Neg Hx    Social History:  reports that he quit smoking about 40 years ago. He quit smokeless tobacco use about 40 years ago.  His smokeless tobacco use included chew. He reports that he does not drink alcohol or use drugs. Allergies: No Known Allergies Medications Prior to Admission  Medication Sig Dispense Refill  . Artificial Tear Ointment (DRY EYES OP) Apply 1 drop to eye as needed (dry eyes).    [name] aspirin 81 MG tablet Take 1 tablet (81 mg total) by mouth daily. 30 tablet   . donepezil (ARICEPT) 10 MG tablet Take 10 mg by mouth at bedtime.    . fish oil-omega-3 fatty acids 1000 MG capsule Take 1 g by mouth daily.     . folic acid (FOLVITE) 1 MG tablet Take 1 mg by mouth daily.    . metoprolol tartrate (LOPRESSOR) 25 MG tablet TAKE 1 TABLET BY MOUTH TWICE DAILY (Patient taking differently: Take 25 mg by mouth 2 (two) times daily. ) 60 tablet 8  . nitroGLYCERIN (NITROSTAT) 0.4 MG SL tablet Place 1 tablet (0.4 mg total) under the tongue every 5 (five) minutes as needed for chest  pain. 25 tablet 6  . simvastatin (ZOCOR) 40 MG tablet Take 1 tablet (40 mg total) by mouth every morning. Patient needs to call and schedule an appointment for further refills 1st attempt 30 tablet 0  . Thiamine HCl (VITAMIN B-1 PO) Take 1 tablet by mouth daily.     [name] warfarin (COUMADIN) 5 MG tablet USE AS DIRECTED BY COUMADIN CLINIC (Patient taking differently: Take 2.5 mg by mouth See admin instructions. Take 5mg  Tuesday-Sunday and 2.5mg  every monday) 30 tablet 3  . zolpidem (AMBIEN) 10 MG tablet Take 1 tablet (10 mg total) by mouth at bedtime as needed for sleep.    . mupirocin cream (BACTROBAN) 2 % Apply 1  application topically 2 (two) times daily. (Patient not taking: Reported on 01/20/2018) 30 g 1  . tizanidine (ZANAFLEX) 2 MG capsule Take 1 capsule (2 mg total) by mouth at bedtime as needed for muscle spasms. (Patient not taking: Reported on 01/20/2018) 30 capsule 2    Drug Regimen Review Drug regimen was reviewed and remains appropriate with no significant issues identified  Home: Home Living Family/patient expects to be discharged to:: Private residence Living Arrangements: Alone Available Help at Discharge: Family, Available PRN/intermittently Type of Home: House Home Access: Stairs to enter [employer] of Steps: 5 Entrance Stairs-Rails: Right, Left Home Layout: Two level, Able to live on main level with bedroom/bathroom Home Equipment: Environmental consultant - 2 wheels  Lives With: Alone   Functional History: Prior Function Level of Independence: Independent Comments: Per daughter, pt recently shared with family regarding falls at home. Pt reports 2 falls in the past two months  Functional Status:  Mobility: Bed Mobility Overal bed mobility: Needs Assistance Bed Mobility: Supine to Sit Supine to sit: Mod assist, HOB elevated General bed mobility comments: Pt insistent HOB be elevated and not willing attempt bed mobility without assist. ModA for trunk elevation. C/o severe chest  pain upon sitting; RN notified and present Transfers Overall transfer level: Needs assistance Equipment used: Rolling walker (2 wheeled) Transfers: Sit to/from Stand Sit to Stand: Min assist, From elevated surface General transfer comment: Pt able to stand on third attempt with minA for trunk elevation; repeated cues for correct hand placement as pt repeatedly attempting to pull on RW Ambulation/Gait Ambulation/Gait assistance: Min guard Gait Distance (Feet): 15 Feet Assistive device: Rolling walker (2 wheeled) Gait Pattern/deviations: Step-through pattern, Decreased stride length, Trunk flexed General Gait Details: Min guard for balance, pt with poor safety awareness. Distance limited secondary to active chest pain; VSS Gait velocity: Decreased Gait velocity interpretation: <1.8 ft/sec, indicate of risk for recurrent falls    ADL:    Cognition: Cognition Overall Cognitive Status: Impaired/Different from baseline Arousal/Alertness: Awake/alert Orientation Level: Oriented X4 Memory: Appears intact Awareness: Appears intact Problem Solving: Appears intact Safety/Judgment: Appears intact Cognition Arousal/Alertness: Awake/alert Behavior During Therapy: Flat affect Overall Cognitive Status: Impaired/Different from baseline Area of Impairment: Attention, Memory, Following commands, Safety/judgement, Awareness, Problem solving Current Attention Level: Sustained Memory: Decreased short-term memory Following Commands: Follows one step commands inconsistently Safety/Judgement: Decreased awareness of safety, Decreased awareness of deficits Awareness: Emergent Problem Solving: Slow processing, Difficulty sequencing, Requires verbal cues General Comments: Daughter present and reports cognition worse than baseline, but difficult to determine if cognition worsening PTA or all new onset since hemorrhage. Very poor safety awareness throughout session. Seems to be masking symptoms. Potential  h/o dementia  Physical Exam: Blood pressure (!) 104/93, pulse 80, temperature 97.7 F (36.5 C), temperature source Axillary, resp. rate 18, height 5\' 9"  (1.753 m), weight 79.7 kg, SpO2 95 %. Physical Exam  Vitals reviewed. Constitutional: He appears well-developed and well-nourished.  HENT:  Head: Normocephalic and atraumatic.  Eyes: EOM are normal. Right eye exhibits no discharge. Left eye exhibits no discharge.  Neck: Normal range of motion. Neck supple. No thyromegaly present.  Cardiovascular:  Irregularly irregular  Respiratory: Effort normal and breath sounds normal. No respiratory distress.  GI: Soft. Bowel sounds are normal. He exhibits no distension.  Musculoskeletal:  No edema or tenderness in extremities  Neurological: He is alert.  Follows simple commands.   He was able to provide his name and age but was limited medical historian. Motor: Right upper extremity/right lower extremity  5/5 proximal distal Left upper extremity: 4/5 (pain inhibition)  Left lower extremity: 4+/5 proximal to distal  Skin: Skin is warm and dry.  Psychiatric: His affect is blunt. His speech is delayed. He is slowed.    Results for orders placed or performed during the hospital encounter of 01/20/18 (from the past 48 hour(s))  Protime-INR     Status: Abnormal   Collection Time: 01/22/18  6:42 AM  Result Value Ref Range   Prothrombin Time 17.8 (H) 11.4 - 15.2 seconds   INR 1.48     Comment: Performed at [facility] [address], [place], [place] [address]  Lipid panel     Status: Abnormal   Collection Time: 01/22/18  6:42 AM  Result Value Ref Range   Cholesterol 216 (H) 0 - 200 mg/dL   Triglycerides 138 <150 mg/dL   HDL 45 >40 mg/dL   Total CHOL/HDL Ratio 4.8 RATIO   VLDL 28 0 - 40 mg/dL   LDL Cholesterol 143 (H) 0 - 99 mg/dL    Comment:        Total Cholesterol/HDL:CHD Risk Coronary Heart Disease Risk Table                     Men   Women  1/2 Average Risk   3.4   3.3   Average Risk       5.0   4.4  2 X Average Risk   9.6   7.1  3 X Average Risk  23.4   11.0        Use the calculated Patient Ratio above and the CHD Risk Table to determine the patient's CHD Risk.        ATP III CLASSIFICATION (LDL):  <100     mg/dL   Optimal  100-129  mg/dL   Near or Above                    Optimal  130-159  mg/dL   Borderline  160-189  mg/dL   High  >190     mg/dL   Very High Performed at [facility] [address] [place], [place] [address]   Hemoglobin A1c     Status: Abnormal   Collection Time: 01/22/18  6:42 AM  Result Value Ref Range   Hgb A1c MFr Bld 5.9 (H) 4.8 - 5.6 %    Comment: (NOTE) Pre diabetes:          5.7%-6.4% Diabetes:              >6.4% Glycemic control for   <7.0% adults with diabetes    Mean Plasma Glucose 122.63 mg/dL    Comment: Performed at [facility] [address], [place], [place] [address]  Glucose, capillary     Status: Abnormal   Collection Time: 01/22/18 10:27 AM  Result Value Ref Range   Glucose-Capillary 166 (H) 70 - 99 mg/dL  Protime-INR     Status: Abnormal   Collection Time: 01/23/18  4:09 AM  Result Value Ref Range   Prothrombin Time 16.7 (H) 11.4 - 15.2 seconds   INR 1.36     Comment: Performed at [facility], [place] [address], [place], [place] [address]   Dg Wrist Complete Left  Result Date: 01/21/2018 CLINICAL DATA:  Pain in the dorsal radial side of the wrist for 6 months EXAM: LEFT WRIST - COMPLETE 3+ VIEW COMPARISON:  None. FINDINGS: No acute  displaced fracture or malalignment. Moderate arthritis at the first [name] joint. Mild arthritis at the distal radiocarpal joint. Small cysts in the scaphoid bone. IMPRESSION: 1. No acute osseous abnormality. 2. Arthritis at the first [facility] joint and distal radial carpal joint Electronically Signed   By: Donavan Foil M.D.   On: 01/21/2018 14:46   Ct Head Wo Contrast  Result Date: 01/22/2018 CLINICAL DATA:  Hemorrhagic stroke follow-up. EXAM: CT HEAD  WITHOUT CONTRAST TECHNIQUE: Contiguous axial images were obtained from the base of the skull through the vertex without intravenous contrast. COMPARISON:  CT head from yesterday. FINDINGS: Brain: Unchanged 7-8 mm hemorrhage in the left thalamus. No new hemorrhage. No significant mass effect. Minimal surrounding edema is unchanged. No hydrocephalus or extra-axial collection. Stable mild cerebral atrophy and chronic microvascular ischemic changes. Vascular: Atherosclerotic vascular calcification of the carotid siphons. No hyperdense vessel. Skull: Negative for fracture or focal lesion. Sinuses/Orbits: No acute finding. Other: None. IMPRESSION: 1. Unchanged small 7-8 mm hemorrhage in the left thalamus. No new hemorrhage or progressive mass effect. Electronically Signed   By: Titus Dubin M.D.   On: 01/22/2018 08:06   Ct Head Wo Contrast  Result Date: 01/21/2018 CLINICAL DATA:  Neurologic change. EXAM: CT HEAD WITHOUT CONTRAST TECHNIQUE: Contiguous axial images were obtained from the base of the skull through the vertex without intravenous contrast. COMPARISON:  None. FINDINGS: Brain: Small hemorrhage in the left thalamus. No evidence of significant mass effect. Mild brain parenchymal volume loss and periventricular microangiopathy. Vascular: Calcific atherosclerotic disease at the skull base. Skull: Normal. Negative for fracture or focal lesion. Sinuses/Orbits: No acute finding. Other: None. IMPRESSION: 7 mm hemorrhage in the left thalamus. No evidence of mass effect. Moderate brain parenchymal volume loss and microangiopathy. These results were called by telephone at the time of interpretation on 01/21/2018 at 11:13 am to Dr. Evangeline Gula, who verbally acknowledged these results. Electronically Signed   By: Fidela Salisbury M.D.   On: 01/21/2018 11:17   Mr Jeri Cos ZO Contrast  Result Date: 01/22/2018 CLINICAL DATA:  Cerebral hemorrhage EXAM: MRI HEAD WITHOUT AND WITH CONTRAST TECHNIQUE: Multiplanar, multiecho  pulse sequences of the brain and surrounding structures were obtained without and with intravenous contrast. CONTRAST:  6 mL Gadovist IV COMPARISON:  CT head 01/22/2018 FINDINGS: Brain: Left thalamic hemorrhage 7 mm unchanged from CT. No other areas of hemorrhage. Negative for acute infarct. Negative for mass lesion. No enhancing mass in the left thalamus. Marked temporal lobe atrophy involving the hippocampus. Question dementia. Mild chronic microvascular ischemic change in the white matter. Normal enhancement postcontrast administration. Vascular: Normal arterial flow void Skull and upper cervical spine: Negative Sinuses/Orbits: Mild mucosal edema paranasal sinuses. Bilateral cataract surgery Other: None IMPRESSION: 7 mm acute hemorrhage left thalamus unchanged from CT. No underlying ischemic infarction or mass lesion. Cerebral atrophy most prominent the temporal lobes. Marked hippocampal atrophy bilaterally. Question dementia. Mild chronic microvascular ischemia in the white matter. Electronically Signed   By: Franchot Gallo M.D.   On: 01/22/2018 10:22       Medical Problem List and Plan: 1.  Gait disorder with history of dementia secondary to left thalamic hemorrhage secondary to small vessel disease versus Coumadin associated coagulopathy source 2.  DVT Prophylaxis/Anticoagulation: SCDs.  Monitor for any bleeding episodes 3. Pain Management: Tylenol as needed 4. Mood: Aricept 10 mg daily, Xanax 0.25 mg twice daily as needed 5. Neuropsych: This patient is capable of making decisions on his own behalf. 6. Skin/Wound Care: Routine skin checks 7.  Fluids/Electrolytes/Nutrition: Routine in and outs with follow-up chemistries 8.  Chronic atrial fibrillation.COUMADIN ON HOLD due to thalamic hemorrhage.PLAN REPEAT CT HEAD IN  2 WEEKS and resume anticoagulation and antiplatelet if ICH resolves. 9.  Hypertension.  Lopressor 25 mg twice daily 10.  Hyperlipidemia.  Lipitor  Post Admission Physician  Evaluation: 1. Preadmission assessment reviewed and changes made below. 2. Functional deficits secondary  to left thalamic hemorrhage. 3. Patient is admitted to receive collaborative, interdisciplinary care between the physiatrist, rehab nursing staff, and therapy team. 4. Patient's level of medical complexity and substantial therapy needs in context of that medical necessity cannot be provided at a lesser intensity of care such as a SNF. 5. Patient has experienced substantial functional loss from his/her baseline which was documented above under the "Functional History" and "Functional Status" headings.  Judging by the patient's diagnosis, physical exam, and functional history, the patient has potential for functional progress which will result in measurable gains while on inpatient rehab.  These gains will be of substantial and practical use upon discharge  in facilitating mobility and self-care at the household level. 14. Physiatrist will provide 24 hour management of medical needs as well as oversight of the therapy plan/treatment and provide guidance as appropriate regarding the interaction of the two. 7. 24 hour rehab nursing will assist with safety, disease management and patient education  and help integrate therapy concepts, techniques,education, etc. 8. PT will assess and treat for/with: Lower extremity strength, range of motion, stamina, balance, functional mobility, safety, adaptive techniques and equipment, coping skills, pain control, stroke education. Goals are: supervision. 9. OT will assess and treat for/with: ADL's, functional mobility, safety, upper extremity strength, adaptive techniques and equipment, ego support, and community reintegration.   Goals are: supervision. Therapy may proceed with showering this patient. 10. SLP will assess and treat for/with: cognition.  Goals are: supervision/Mod I. 11. Case Management and Social Worker will assess and treat for psychological issues and  discharge planning. 12. Team conference will be held weekly to assess progress toward goals and to determine barriers to discharge. 13. Patient will receive at least 3 hours of therapy per day at least 5 days per week. 14. ELOS: 9-14 days.       15. Prognosis:  good  I have personally performed a face to face diagnostic evaluation, including, but not limited to relevant history and physical exam findings, of this patient and developed relevant assessment and plan.  Additionally, I have reviewed and concur with the physician assistant's documentation above.   The patient's status has not changed. The original post admission physician evaluation remains appropriate, and any changes from the pre-admission screening or documentation from the acute chart are noted above.   Delice Lesch, MD, ABPMR Lavon Paganini Angiulli, PA-C 01/23/2018

## 2018-01-23 NOTE — Evaluation (Signed)
Speech Language Pathology Evaluation Patient Details Name: Christian Maxwell MRN: [id number] DOB: 08-Dec-1932 Today's Date: 01/23/2018 Time: 1100-1130 SLP Time Calculation (min) (ACUTE ONLY): 30 min  Problem List:  Patient Active Problem List   Diagnosis Date Noted  . Dementia without behavioral disturbance 01/22/2018  . Ataxia 01/21/2018  . Bleeding in brain (Echo) 01/21/2018  . Hypoxia 01/21/2018  . Chest pain 01/20/2018  . Hearing impairment 11/11/2015  . Olecranon bursitis of left elbow 11/11/2015  . LBBB (left bundle branch block) 05/27/2015  . Senile purpura ([place]) 02/22/2015  . Wound, open, arm, forearm 02/22/2015  . Advanced care planning/counseling discussion 11/06/2014  . Medicare annual wellness visit, initial 11/06/2014  . Bilateral knee pain 03/17/2014  . DOE (dyspnea on exertion) 02/16/2014  . Encounter for therapeutic drug monitoring 06/17/2013  . Memory impairment 03/06/2013  . MRSA colonization 12/12/2012  . Nevus of sternal region 12/04/2012  . Seasonal allergic rhinitis 10/22/2012  . Thyroid disease   . Suprapubic abscess 08/21/2012  . H/O colon cancer, stage I 06/24/2012  . Adenomatous colon polyp 06/24/2012  . Open wound-left arm and suprapubic area. 03/11/2012  . Chest discomfort 04/21/2011  . Hyperlipidemia 10/14/2010  . Atrial fibrillation ([name]) 08/08/2010  . INSOMNIA, MILD 12/12/2006   Past Medical History:  Past Medical History:  Diagnosis Date  . Abscess 7/13   abd abscess  . Cancer [facility])    colon cancer  . Chronic atrial fibrillation ([place])   . History of colon cancer 2000   T3N0 s/p colectomy  . History of echocardiogram    a. Echo 2/13:  Mild LVH, EF 55-60%, mild MR, moderate LAE, mild to moderate RAE;  b. Echo 1/17: mild LVH, vigorous LVF, EF 65-70%, no RWMA, trivial AI, trivial MR, severe LAE, mild RAE, mod TR, PASP 32 mmHg  . History of stress test    a. Cardiolite in 9/04: EF 67%, no ischemia.;  b. Myoview 6/16: no ischemia   . Hx of  adenomatous colonic polyps   . Hyperlipidemia   . Hypertension   . METHICILLIN RESISTANT STAPHYLOCOCCUS AUREUS INFECTION 12/12/2006   Annotation: stitch abcess in lower abdomen after colon cancer resection Qualifier: Diagnosis of  By: [name] MD, [name]    . Myocardial infarction ([place]) 1985  . Postoperative stitch abscess 07/19/2011  . PVC's (premature ventricular contractions)   . Syncope and collapse    pt denies  . Thyroid disease    followed by [name]   Past Surgical History:  Past Surgical History:  Procedure Laterality Date  . COLECTOMY  2000   6 inches removed  . COLONOSCOPY  2009   polyp, h/o cancer [name])  . EYE SURGERY     cataract surgery both eyes- pts ates he has never had cataract surgery 10-26-15  . HERNIA REPAIR  09/1882   DB umbilical hernia  . INCISE AND DRAIN ABCESS  2009,2010,2011,2012  . INCISION AND DRAINAGE Right 04/02/2013   Procedure: INCISION AND DRAINAGE RIGHT KNEE HEMATOMA;  Surgeon: [name], MD;  Location: [place];  Service: Orthopedics;  Laterality: Right;  . INCISION AND DRAINAGE ABSCESS N/A 09/20/2012   Procedure: INCISION AND DRAINAGE SUPRAPUBIC ABSCESS;  Surgeon: [name], MD;  Location: [place];  Service: General;  Laterality: N/A;  . INCISION AND DRAINAGE ABSCESS Left 02/28/2013   Procedure: INCISION AND DRAINAGE LEFT ARM ABSCESS;  Surgeon: [name], MD;  Location: [place];  Service: General;  Laterality: Left;  . LUMBAR LAMINECTOMY  1977  .  MASS EXCISION  03/07/2012   EXCISION MASS;  Surgeon: [name], MD;  Laterality: Right;  removal mass posterior right arm  . POLYPECTOMY    . WOUND EXPLORATION  2006   For possible stitch abscess Dr [name]   HPI:  Pt is an 84 y.o. male admitted 01/20/18 with c/o chest pain. Family shares that pt having ataxia, frequent falls and difficulty feeding self of late. CT and MRI show 7 mm hemorrhage in the left thalamus, as well as marked bilateral atrophy in the  hippocampus. PMH includes HTN, colon CA, a-fib.   Assessment / Plan / Recommendation Clinical Impression  Pt presents with baseline cognitive deficits specific to organization and problem-solving, now exacerbated s/p acute CVA.  He demonstrates appropriate social communication and normal expressive/receptive language.  Demonstrates poor insight/awareness re: deficits; difficulty with tasks of short-term recall, selective and alternating attention.  He tends to respond to instructions impulsively without allowing time to listen to the entirety of message .  His daughter, Lynelle Smoke, describes her father as being very structured in his daily routines. His wife passed away last year, and he has demonstrated increased difficulty in managing his bills, organizing his home.  We discussed the impact of acute on chronic neurological pathology and unfamiliar environment on his current cognition.  Pt would benefit from further SLP to address functional cognition.  Defer SLP f/u to CIR.    SLP Assessment  SLP Recommendation/Assessment: All further Speech Lanaguage Pathology  needs can be addressed in the next venue of care SLP Visit Diagnosis: Cognitive communication deficit (R41.841)    Follow Up Recommendations       Frequency and Duration           SLP Evaluation Cognition  Overall Cognitive Status: Impaired/Different from baseline Arousal/Alertness: Awake/alert Orientation Level: Oriented X4 Attention: Selective Selective Attention: Impaired Selective Attention Impairment: Verbal basic Memory: Impaired Memory Impairment: Retrieval deficit;Prospective memory Awareness: Impaired Awareness Impairment: Intellectual impairment Problem Solving: Impaired Behaviors: Impulsive Safety/Judgment: Impaired       Comprehension  Auditory Comprehension Overall Auditory Comprehension: Appears within functional limits for tasks assessed    Expression Expression Primary Mode of Expression: Verbal Verbal  Expression Overall Verbal Expression: Appears within functional limits for tasks assessed   Oral / Motor  Motor Speech Overall Motor Speech: Appears within functional limits for tasks assessed   GO                    Juan Quam Laurice 01/23/2018, 1:43 PM

## 2018-01-23 NOTE — Progress Notes (Signed)
Inpatient Rehabilitation-Admissions Coordinator   [facility] has received medical approval for admit to CIR today. Pt and family in agreement. AC will notify floor RN, CM and SW regarding plans. Please call If questions.   Jhonnie Garner, OTR/L  Rehab Admissions Coordinator  [phone] 01/23/2018 4:27 PM

## 2018-01-23 NOTE — Progress Notes (Signed)
Physical Medicine and Rehabilitation Consult Reason for Consult: Decreased functional mobility Referring Physician: Internal medicine   HPI: Christian Maxwell is a 84 y.o. right-handed male with history of dementia maintained on Aricept, chronic atrial fibrillation maintained on Coumadin, colon cancer, hypertension.  Per chart review patient lives alone.  Reported to be independent.  Driving short distances.  Two-level home with bedroom upstairs.  His closest family is 1-1/2 hours away question assistance on discharge.  Presented 01/21/2018 with nonspecific chest pain, dizziness and generalized weakness.  CT/MRI showed 7 mm acute hemorrhage left thalamus.  No underlying ischemic infarction or mass lesion.  Echocardiogram with ejection fraction of 65% no wall motion abnormalities.  Troponin mildly elevated 0.06, INR 2.02.  Coumadin held due to bleed.  Cardiology follow-up for mildly elevated troponin nonspecific chest pain did not feel to be acute coronary syndrome and monitored.  Physical and Occupational Therapy evaluations pending.  MD has requested physical medicine rehab consult.   Review of Systems  Constitutional: Negative for chills and fever.  HENT: Negative for hearing loss.   Eyes: Negative for blurred vision and double vision.  Respiratory: Negative for cough and shortness of breath.   Cardiovascular: Positive for palpitations. Negative for chest pain and leg swelling.  Gastrointestinal: Positive for constipation. Negative for nausea.  Genitourinary: Positive for urgency. Negative for dysuria and hematuria.  Musculoskeletal: Positive for falls and myalgias.  Skin: Negative for rash.  Neurological: Positive for dizziness.  Psychiatric/Behavioral: Positive for memory loss.  All other systems reviewed and are negative.      Past Medical History:  Diagnosis Date  . Abscess 7/13   abd abscess  . Cancer [facility]    colon cancer  . Chronic atrial fibrillation ([place])   . History of  colon cancer 2000   T3N0 s/p colectomy  . History of echocardiogram    a. Echo 2/13:  Mild LVH, EF 55-60%, mild MR, moderate LAE, mild to moderate RAE;  b. Echo 1/17: mild LVH, vigorous LVF, EF 65-70%, no RWMA, trivial AI, trivial MR, severe LAE, mild RAE, mod TR, PASP 32 mmHg  . History of stress test    a. Cardiolite in 9/04: EF 67%, no ischemia.;  b. Myoview 6/16: no ischemia   . Hx of adenomatous colonic polyps   . Hyperlipidemia   . Hypertension   . METHICILLIN RESISTANT STAPHYLOCOCCUS AUREUS INFECTION 12/12/2006   Annotation: stitch abcess in lower abdomen after colon cancer resection Qualifier: Diagnosis of  By: [name] MD, [name]    . Myocardial infarction ([place]) 1985  . Postoperative stitch abscess 07/19/2011  . PVC's (premature ventricular contractions)   . Syncope and collapse    pt denies  . Thyroid disease    followed by [name]        Past Surgical History:  Procedure Laterality Date  . COLECTOMY  2000   6 inches removed  . COLONOSCOPY  2009   polyp, h/o cancer [name])  . EYE SURGERY     cataract surgery both eyes- pts ates he has never had cataract surgery 10-26-15  . HERNIA REPAIR  07/5327   DB umbilical hernia  . INCISE AND DRAIN ABCESS  2009,2010,2011,2012  . INCISION AND DRAINAGE Right 04/02/2013   Procedure: INCISION AND DRAINAGE RIGHT KNEE HEMATOMA;  Surgeon: [name], MD;  Location: [place];  Service: Orthopedics;  Laterality: Right;  . INCISION AND DRAINAGE ABSCESS N/A 09/20/2012   Procedure: INCISION AND DRAINAGE SUPRAPUBIC ABSCESS;  Surgeon: [name], MD;  Location: [place];  Service: General;  Laterality: N/A;  . INCISION AND DRAINAGE ABSCESS Left 02/28/2013   Procedure: INCISION AND DRAINAGE LEFT ARM ABSCESS;  Surgeon: [name], MD;  Location: [place];  Service: General;  Laterality: Left;  . LUMBAR LAMINECTOMY  1977  . MASS EXCISION  03/07/2012   EXCISION MASS;  Surgeon: [name], MD;  Laterality: Right;  removal mass posterior right arm  . POLYPECTOMY    . WOUND EXPLORATION  2006   For possible stitch abscess Dr [name]        Family History  Problem Relation Age of Onset  . Stroke Mother   . Stroke Father   . Hypertension Father   . CAD Father 81       MI  . Dementia Father   . Stroke Sister 34  . Stroke Brother   . Stroke Brother   . Diabetes Brother   . CAD Brother 26       MI  . Dementia Sister 8  . Dementia Brother   . Colon cancer Cousin        x4 total with colon cancer  . Cancer Neg Hx   . Colon polyps Neg Hx   . Rectal cancer Neg Hx   . Stomach cancer Neg Hx    Social History:  reports that he quit smoking about 40 years ago. He quit smokeless tobacco use about 40 years ago.  His smokeless tobacco use included chew. He reports that he does not drink alcohol or use drugs. Allergies: No Known Allergies       Medications Prior to Admission  Medication Sig Dispense Refill  . Artificial Tear Ointment (DRY EYES OP) Apply 1 drop to eye as needed (dry eyes).    [name] aspirin 81 MG tablet Take 1 tablet (81 mg total) by mouth daily. 30 tablet   . donepezil (ARICEPT) 10 MG tablet Take 10 mg by mouth at bedtime.    . fish oil-omega-3 fatty acids 1000 MG capsule Take 1 g by mouth daily.     . folic acid (FOLVITE) 1 MG tablet Take 1 mg by mouth daily.    . metoprolol tartrate (LOPRESSOR) 25 MG tablet TAKE 1 TABLET BY MOUTH TWICE DAILY (Patient taking differently: Take 25 mg by mouth 2 (two) times daily. ) 60 tablet 8  . nitroGLYCERIN (NITROSTAT) 0.4 MG SL tablet Place 1 tablet (0.4 mg total) under the tongue every 5 (five) minutes as needed for chest pain. 25 tablet 6  . simvastatin (ZOCOR) 40 MG tablet Take 1 tablet (40 mg total) by mouth every morning. Patient needs to call and schedule an appointment for further refills 1st attempt 30 tablet 0  . Thiamine HCl (VITAMIN B-1 PO) Take 1 tablet by mouth daily.     [name]  warfarin (COUMADIN) 5 MG tablet USE AS DIRECTED BY COUMADIN CLINIC (Patient taking differently: Take 2.5 mg by mouth See admin instructions. Take 5mg  Tuesday-Sunday and 2.5mg  every monday) 30 tablet 3  . zolpidem (AMBIEN) 10 MG tablet Take 1 tablet (10 mg total) by mouth at bedtime as needed for sleep.    . mupirocin cream (BACTROBAN) 2 % Apply 1 application topically 2 (two) times daily. (Patient not taking: Reported on 01/20/2018) 30 g 1  . tizanidine (ZANAFLEX) 2 MG capsule Take 1 capsule (2 mg total) by mouth at bedtime as needed for muscle spasms. (Patient not taking: Reported on 01/20/2018) 30 capsule 2    Home:  Home Living Family/patient expects to be discharged to:: Private residence Living Arrangements: Alone Available Help at Discharge: Family, Available PRN/intermittently Type of Home: House Home Access: Stairs to enter [employer] of Steps: 5 Entrance Stairs-Rails: Right, Left Home Layout: Two level, Able to live on main level with bedroom/bathroom Home Equipment: Environmental consultant - 2 wheels  Lives With: Alone  Functional History: Prior Function Level of Independence: Independent Comments: Per daughter, pt recently shared with family regarding falls at home. Pt reports 2 falls in the past two months Functional Status:  Mobility: Bed Mobility Overal bed mobility: Needs Assistance Bed Mobility: Supine to Sit Supine to sit: Mod assist, HOB elevated General bed mobility comments: Pt insistent HOB be elevated and not willing attempt bed mobility without assist. ModA for trunk elevation. C/o severe chest pain upon sitting; RN notified and present Transfers Overall transfer level: Needs assistance Equipment used: Rolling walker (2 wheeled) Transfers: Sit to/from Stand Sit to Stand: Min assist, From elevated surface General transfer comment: Pt able to stand on third attempt with minA for trunk elevation; repeated cues for correct hand placement as pt repeatedly attempting to  pull on RW Ambulation/Gait Ambulation/Gait assistance: Min guard Gait Distance (Feet): 15 Feet Assistive device: Rolling walker (2 wheeled) Gait Pattern/deviations: Step-through pattern, Decreased stride length, Trunk flexed General Gait Details: Min guard for balance, pt with poor safety awareness. Distance limited secondary to active chest pain; VSS Gait velocity: Decreased Gait velocity interpretation: <1.8 ft/sec, indicate of risk for recurrent falls  ADL:  Cognition: Cognition Overall Cognitive Status: Impaired/Different from baseline Arousal/Alertness: Awake/alert Orientation Level: Oriented X4 Memory: Appears intact Awareness: Appears intact Problem Solving: Appears intact Safety/Judgment: Appears intact Cognition Arousal/Alertness: Awake/alert Behavior During Therapy: Flat affect Overall Cognitive Status: Impaired/Different from baseline Area of Impairment: Attention, Memory, Following commands, Safety/judgement, Awareness, Problem solving Current Attention Level: Sustained Memory: Decreased short-term memory Following Commands: Follows one step commands inconsistently Safety/Judgement: Decreased awareness of safety, Decreased awareness of deficits Awareness: Emergent Problem Solving: Slow processing, Difficulty sequencing, Requires verbal cues General Comments: Daughter present and reports cognition worse than baseline, but difficult to determine if cognition worsening PTA or all new onset since hemorrhage. Very poor safety awareness throughout session. Seems to be masking symptoms. Potential h/o dementia  Blood pressure 107/73, pulse 75, temperature 97.8 F (36.6 C), temperature source Oral, resp. rate 18, height 5\' 9"  (1.753 m), weight 78.5 kg, SpO2 95 %. Physical Exam  Vitals reviewed. HENT:  Head: Normocephalic.  Eyes: EOM are normal.  Neck: Normal range of motion. Neck supple. No thyromegaly present.  Cardiovascular:  Cardiac rate controlled  Respiratory:  Effort normal and breath sounds normal. No respiratory distress.  GI: Soft. Bowel sounds are normal. He exhibits no distension.  Musculoskeletal:  Left shoulder limited by pain.  Neurological: He is alert.  Patient sitting up in chair.  Makes good eye contact with examiner.  Follows simple commands.  He can answer simple question regards to person, age and date of birth.  Has some difficulty with geographical information and limited medical historian.  Skin: Skin is warm and dry.    LabResultsLast24Hours       Results for orders placed or performed during the hospital encounter of 01/20/18 (from the past 24 hour(s))  Protime-INR     Status: Abnormal   Collection Time: 01/22/18  6:42 AM  Result Value Ref Range   Prothrombin Time 17.8 (H) 11.4 - 15.2 seconds   INR 1.48   Lipid panel     Status:  Abnormal   Collection Time: 01/22/18  6:42 AM  Result Value Ref Range   Cholesterol 216 (H) 0 - 200 mg/dL   Triglycerides 138 <150 mg/dL   HDL 45 >40 mg/dL   Total CHOL/HDL Ratio 4.8 RATIO   VLDL 28 0 - 40 mg/dL   LDL Cholesterol 143 (H) 0 - 99 mg/dL  Hemoglobin A1c     Status: Abnormal   Collection Time: 01/22/18  6:42 AM  Result Value Ref Range   Hgb A1c MFr Bld 5.9 (H) 4.8 - 5.6 %   Mean Plasma Glucose 122.63 mg/dL  Glucose, capillary     Status: Abnormal   Collection Time: 01/22/18 10:27 AM  Result Value Ref Range   Glucose-Capillary 166 (H) 70 - 99 mg/dL      ImagingResults(Last48hours)  Dg Wrist Complete Left  Result Date: 01/21/2018 CLINICAL DATA:  Pain in the dorsal radial side of the wrist for 6 months EXAM: LEFT WRIST - COMPLETE 3+ VIEW COMPARISON:  None. FINDINGS: No acute displaced fracture or malalignment. Moderate arthritis at the first [facility] joint. Mild arthritis at the distal radiocarpal joint. Small cysts in the scaphoid bone. IMPRESSION: 1. No acute osseous abnormality. 2. Arthritis at the first [facility] joint and distal radial carpal joint  Electronically Signed   By: Donavan Foil M.D.   On: 01/21/2018 14:46   Ct Head Wo Contrast  Result Date: 01/22/2018 CLINICAL DATA:  Hemorrhagic stroke follow-up. EXAM: CT HEAD WITHOUT CONTRAST TECHNIQUE: Contiguous axial images were obtained from the base of the skull through the vertex without intravenous contrast. COMPARISON:  CT head from yesterday. FINDINGS: Brain: Unchanged 7-8 mm hemorrhage in the left thalamus. No new hemorrhage. No significant mass effect. Minimal surrounding edema is unchanged. No hydrocephalus or extra-axial collection. Stable mild cerebral atrophy and chronic microvascular ischemic changes. Vascular: Atherosclerotic vascular calcification of the carotid siphons. No hyperdense vessel. Skull: Negative for fracture or focal lesion. Sinuses/Orbits: No acute finding. Other: None. IMPRESSION: 1. Unchanged small 7-8 mm hemorrhage in the left thalamus. No new hemorrhage or progressive mass effect. Electronically Signed   By: Titus Dubin M.D.   On: 01/22/2018 08:06   Ct Head Wo Contrast  Result Date: 01/21/2018 CLINICAL DATA:  Neurologic change. EXAM: CT HEAD WITHOUT CONTRAST TECHNIQUE: Contiguous axial images were obtained from the base of the skull through the vertex without intravenous contrast. COMPARISON:  None. FINDINGS: Brain: Small hemorrhage in the left thalamus. No evidence of significant mass effect. Mild brain parenchymal volume loss and periventricular microangiopathy. Vascular: Calcific atherosclerotic disease at the skull base. Skull: Normal. Negative for fracture or focal lesion. Sinuses/Orbits: No acute finding. Other: None. IMPRESSION: 7 mm hemorrhage in the left thalamus. No evidence of mass effect. Moderate brain parenchymal volume loss and microangiopathy. These results were called by telephone at the time of interpretation on 01/21/2018 at 11:13 am to Dr. Evangeline Gula, who verbally acknowledged these results. Electronically Signed   By: Fidela Salisbury M.D.   On:  01/21/2018 11:17   Mr Jeri Cos NF Contrast  Result Date: 01/22/2018 CLINICAL DATA:  Cerebral hemorrhage EXAM: MRI HEAD WITHOUT AND WITH CONTRAST TECHNIQUE: Multiplanar, multiecho pulse sequences of the brain and surrounding structures were obtained without and with intravenous contrast. CONTRAST:  6 mL Gadovist IV COMPARISON:  CT head 01/22/2018 FINDINGS: Brain: Left thalamic hemorrhage 7 mm unchanged from CT. No other areas of hemorrhage. Negative for acute infarct. Negative for mass lesion. No enhancing mass in the left thalamus. Marked temporal lobe atrophy involving the  hippocampus. Question dementia. Mild chronic microvascular ischemic change in the white matter. Normal enhancement postcontrast administration. Vascular: Normal arterial flow void Skull and upper cervical spine: Negative Sinuses/Orbits: Mild mucosal edema paranasal sinuses. Bilateral cataract surgery Other: None IMPRESSION: 7 mm acute hemorrhage left thalamus unchanged from CT. No underlying ischemic infarction or mass lesion. Cerebral atrophy most prominent the temporal lobes. Marked hippocampal atrophy bilaterally. Question dementia. Mild chronic microvascular ischemia in the white matter. Electronically Signed   By: Franchot Gallo M.D.   On: 01/22/2018 10:22      Assessment/Plan: Diagnosis: left thalamic hemorrhage, hx of dementia, gait disorder 1. Does the need for close, 24 hr/day medical supervision in concert with the patient's rehab needs make it unreasonable for this patient to be served in a less intensive setting? Yes 2. Co-Morbidities requiring supervision/potential complications: afib, pain mgt, HTN, insomnia/sleep disorder 3. Due to bladder management, bowel management, safety, skin/wound care, disease management, medication administration, pain management and patient education, does the patient require 24 hr/day rehab nursing? Yes 4. Does the patient require coordinated care of a physician, rehab nurse, PT (1-2  hrs/day, 5 days/week), OT (1-2 hrs/day, 5 days/week) and SLP (1-2 hrs/day, 5 days/week) to address physical and functional deficits in the context of the above medical diagnosis(es)? Yes and Potentially Addressing deficits in the following areas: balance, endurance, locomotion, strength, transferring, bowel/bladder control, bathing, dressing, feeding, grooming, toileting, cognition and psychosocial support 5. Can the patient actively participate in an intensive therapy program of at least 3 hrs of therapy per day at least 5 days per week? Yes 6. The potential for patient to make measurable gains while on inpatient rehab is excellent 7. Anticipated functional outcomes upon discharge from inpatient rehab are modified independent and supervision  with PT, modified independent and supervision with OT, modified independent and supervision with SLP. 8. Estimated rehab length of stay to reach the above functional goals is: potentially 7-10 days 9. Anticipated D/C setting: Home 10. Anticipated post D/C treatments: HH therapy and Outpatient therapy 11. Overall Rehab/Functional Prognosis: good  RECOMMENDATIONS: This patient's condition is appropriate for continued rehabilitative care in the following setting: CIR Patient has agreed to participate in recommended program. Potentially Note that insurance prior authorization may be required for reimbursement for recommended care.  Comment: Pt will need supervision at home and likely long term living arrangements needs to be discussed. Rehab Admissions Coordinator to follow up.  Thanks,  Meredith Staggers, MD, Mellody Drown  I have personally performed a face to face diagnostic evaluation of this patient. Additionally, I have reviewed and concur with the physician assistant's documentation above.    Meredith Staggers, MD 01/22/2018        Revision History                        Routing History

## 2018-01-23 NOTE — Progress Notes (Signed)
Received pt. As a new admission.Pt. And family have been oriented to rehab.

## 2018-01-23 NOTE — IPOC Note (Signed)
Overall Plan of Care [facility]) Patient Details Name: Christian Maxwell MRN: [id number] DOB: 06-26-1932  Admitting Diagnosis: left thalamic hemorrhage  Hospital Problems: Active Problems:   Thalamic hemorrhage (HCC)   SOB (shortness of breath)   Transaminitis   Prediabetes     Functional Problem List: Nursing Bladder, Behavior, Endurance, Motor, Pain, Safety, Medication Management  PT Balance, Endurance, Motor, Safety  OT Balance, Cognition, Endurance, Motor, Pain, Safety  SLP Cognition  TR         Basic ADL's: OT Grooming, Bathing, Dressing, Toileting     Advanced  ADL's: OT       Transfers: PT Bed Mobility, Bed to Chair, Car, Manufacturing systems engineer, Metallurgist: PT Ambulation, Stairs     Additional Impairments: OT    SLP Social Cognition   Problem Solving, Memory, Awareness  TR      Anticipated Outcomes Item Anticipated Outcome  Self Feeding no goal  Swallowing      Basic self-care  S  Toileting  S   Bathroom Transfers S  Bowel/Bladder  Patient will manage bowel and bladder with minimal assist by discharge.   Transfers  S overall  Locomotion  S overall with LRAD  Communication     Cognition  Supervision  Pain  Patient will remain at pain level less than 2/10.   Safety/Judgment  Patient will remain free of falls with injury with minimal assist during hospital stay.    Therapy Plan: PT Intensity: Minimum of 1-2 x/day ,45 to 90 minutes PT Frequency: 5 out of 7 days PT Duration Estimated Length of Stay: 7-10 days OT Intensity: Minimum of 1-2 x/day, 45 to 90 minutes OT Frequency: 5 out of 7 days OT Duration/Estimated Length of Stay: 7-9 SLP Intensity: Minumum of 1-2 x/day, 30 to 90 minutes SLP Frequency: 3 to 5 out of 7 days SLP Duration/Estimated Length of Stay: 7-10 days     Team Interventions: Nursing Interventions Patient/Family Education, Bladder Management, Pain Management, Psychosocial Support, Medication Management  PT  interventions Ambulation/gait training, Discharge planning, Functional mobility training, Psychosocial support, Therapeutic Activities, Therapeutic Exercise, Skin care/wound management, Neuromuscular re-education, Disease management/prevention, Balance/vestibular training, Cognitive remediation/compensation, DME/adaptive equipment instruction, Pain management, Splinting/orthotics, UE/LE Strength taining/ROM, UE/LE Coordination activities, Stair training, Patient/family education, Community reintegration  OT Interventions Training and development officer, Discharge planning, Pain management, Self Care/advanced ADL retraining, Therapeutic Activities, UE/LE Coordination activities, Cognitive remediation/compensation, Disease mangement/prevention, Functional mobility training, Patient/family education, Skin care/wound managment, Therapeutic Exercise, Visual/perceptual remediation/compensation, Academic librarian, Engineer, drilling, Neuromuscular re-education, Psychosocial support, UE/LE Strength taining/ROM  SLP Interventions Cognitive remediation/compensation, English as a second language teacher, Patient/family education, Internal/external aids, Environmental controls, Functional tasks  TR Interventions    SW/CM Interventions Discharge Planning, Psychosocial Support, Patient/Family Education   Barriers to Discharge MD  Medical stability  Nursing Incontinence, Medication compliance, Behavior    PT Decreased caregiver support, Home environment access/layout, Lack of/limited family support    OT Decreased caregiver support, Insurance for SNF coverage, Lack of/limited family support unsure if family able to provide 24/7 supervision  SLP Decreased caregiver support    SW Decreased caregiver support Daughter's live out of town no one is Engineer, petroleum Discharge Planning: Destination: PT-Home ,OT- Home , SLP-Home Projected Follow-up: PT-24 hour supervision/assistance, Home health PT(vs SNF), OT-  Home health  OT, SLP-Home Health SLP, 24 hour supervision/assistance Projected Equipment Needs: PT-To be determined, OT- Tub/shower seat, SLP-None recommended by SLP Equipment Details: PT-has RW, OT-  Patient/family involved in discharge planning: PT- Patient,  OT-Patient, Family member/caregiver, SLP-Patient, Family member/caregiver  MD ELOS: 7-10 days. Medical Rehab Prognosis:  Good Assessment: 84 year old right-handed male with history of dementia maintained on Aricept.  Chronic atrial fibrillation maintained on Coumadin, colon cancer, hypertension.  There were reports of 2 falls in the past couple of months.  Presented 01/21/2018 with nonspecific chest pain, dizziness and generalized weakness.  CT reviewed, showing left thalamic hemorrhage. Per report and MRI, 7 mm acute left thalamic hemorrhage.  No underlying ischemic infarct or mass lesion.  Echocardiogram with ejection fraction of 65% no wall motion abnormalities.  Troponin 0.06, INR 2.02.  Coumadin held due to bleed.  Carotid Dopplers with no ICA stenosis.  Cardiology follow-up for mildly elevated troponin nonspecific chest pain did not feel to be acute coronary syndrome and monitored.  In regards to anticoagulation neurology recommends hold on anticoagulation due to [place] repeat CT scan of the head in 2 weeks and if ICH resolved may resume anticoagulation antiplatelet therapy.  Tolerating a regular diet.  Patient with resulting functional deficits with mobility, balance, endurance, cognition. Will set goals for Supervision with PT/OT/SLP.  See Team Conference Notes for weekly updates to the plan of care

## 2018-01-23 NOTE — PMR Pre-admission (Signed)
PMR Admission Coordinator Pre-Admission Assessment  Patient: Christian Maxwell is an 84 y.o., male MRN: [id number] DOB: 08/23/1932 Height: 5' 9" (175.3 cm) Weight: 79.7 kg(pt has chest pain unable to stabd)              Insurance Information HMO:     PPO:      PCP:      IPA:      80/20: yes     OTHER:  PRIMARY: Medicare Part A and B      Policy#: [id number]      Subscriber: Patient CM Name:       Phone#:      Fax#:  Pre-Cert#:       Employer:  Benefits:  Phone #: NA     Name: Verified Online via [employer]. Date: part A and B effective: 10/14/1999     Deduct: $1,364      Out of Pocket Max:       Life Max:  CIR: Covered per Medicare guidelines once yearly deductible is met      SNF: days 1-20, 100%; days 21-100, 80% Outpatient: 80%     Co-Pay: 20% Home Health: 100%      Co-Pay:  DME: 80%     Co-Pay: 20% Providers: Pt's choice SECONDARY: BCBS      Policy#: [id number]      Subscriber: patient CM Name:       Phone#:      Fax#:  Pre-Cert#:       Employer:  Benefits:  Phone #: [phone]     Name:  Eff. Date:      Deduct:       Out of Pocket Max:       Life Max:  CIR:       SNF:  Outpatient:      Co-Pay:  Home Health:       Co-Pay:  DME:      Co-Pay:   Medicaid Application Date:       Case Manager:  Disability Application Date:       Case Worker:   Emergency Contact Information Contact Information    Name Relation Home Work Mobile   May,Natalie Daughter [phone]  [phone]     Current Medical History  Patient Admitting Diagnosis: left thalamic hemorrhage, hx of dementia, gait disorder  History of Present Illness: Christian Maxwell is a 84 y.o. right-handed male with history of dementia maintained on Aricept, chronic atrial fibrillation maintained on Coumadin, colon cancer, hypertension.  Per chart review patient lives alone.  Reported to be independent.  Driving short distances.  Two-level home with bedroom upstairs.  His closest family is 1-1/2 hours away question  assistance on discharge.  Presented 01/21/2018 with nonspecific chest pain, dizziness and generalized weakness.  CT/MRI showed 7 mm acute hemorrhage left thalamus.  No underlying ischemic infarction or mass lesion.  Echocardiogram with ejection fraction of 65% no wall motion abnormalities.  Troponin mildly elevated 0.06, INR 2.02.  Coumadin held due to bleed.  Cardiology follow-up for mildly elevated troponin nonspecific chest pain did not feel to be acute coronary syndrome and monitored.  Physical and Occupational Therapy evaluations pending.  MD has requested physical medicine rehab consult. Pt is to be admitted to CIR on 01/23/18.   Complete NIHSS TOTAL: 0    Past Medical History  Past Medical History:  Diagnosis Date  . Abscess 7/13   abd abscess  . Cancer [facility])    colon cancer  . Chronic  atrial fibrillation ([place])   . History of colon cancer 2000   T3N0 s/p colectomy  . History of echocardiogram    a. Echo 2/13:  Mild LVH, EF 55-60%, mild MR, moderate LAE, mild to moderate RAE;  b. Echo 1/17: mild LVH, vigorous LVF, EF 65-70%, no RWMA, trivial AI, trivial MR, severe LAE, mild RAE, mod TR, PASP 32 mmHg  . History of stress test    a. Cardiolite in 9/04: EF 67%, no ischemia.;  b. Myoview 6/16: no ischemia   . Hx of adenomatous colonic polyps   . Hyperlipidemia   . Hypertension   . METHICILLIN RESISTANT STAPHYLOCOCCUS AUREUS INFECTION 12/12/2006   Annotation: stitch abcess in lower abdomen after colon cancer resection Qualifier: Diagnosis of  By: [name] MD, [name]    . Myocardial infarction ([place]) 1985  . Postoperative stitch abscess 07/19/2011  . PVC's (premature ventricular contractions)   . Syncope and collapse    pt denies  . Thyroid disease    followed by [name]    Family History  family history includes CAD (age of onset: 46) in his brother; CAD (age of onset: 78) in his father; Colon cancer in his cousin; Dementia in his brother and father; Dementia (age of onset: 45) in his  sister; Diabetes in his brother; Hypertension in his father; Stroke in his brother, brother, father, and mother; Stroke (age of onset: 32) in his sister.  Prior Rehab/Hospitalizations:  Has the patient had major surgery during 100 days prior to admission? No  Current Medications   Current Facility-Administered Medications:  .  acetaminophen (TYLENOL) tablet 650 mg, 650 mg, Oral, Q4H PRN, Osei-Bonsu, George, MD, 650 mg at 01/23/18 1201 .  ALPRAZolam (XANAX) tablet 0.25 mg, 0.25 mg, Oral, BID PRN, Osei-Bonsu, George, MD .  atorvastatin (LIPITOR) tablet 40 mg, 40 mg, Oral, q1800, Rosalin Hawking, MD, 40 mg at 01/22/18 1813 .  donepezil (ARICEPT) tablet 10 mg, 10 mg, Oral, QHS, Osei-Bonsu, George, MD, 10 mg at 01/22/18 2052 .  folic acid (FOLVITE) tablet 1 mg, 1 mg, Oral, Daily, Osei-Bonsu, George, MD, 1 mg at 01/23/18 0910 .  Influenza vac split quadrivalent PF (FLUARIX) injection 0.5 mL, 0.5 mL, Intramuscular, Tomorrow-1000, Osei-Bonsu, George, MD .  labetalol (NORMODYNE,TRANDATE) injection 5 mg, 5 mg, Intravenous, Q2H PRN, Black, Karen M, NP .  metoprolol tartrate (LOPRESSOR) tablet 25 mg, 25 mg, Oral, BID, Osei-Bonsu, George, MD, 25 mg at 01/23/18 0910 .  nitroGLYCERIN (NITROSTAT) SL tablet 0.4 mg, 0.4 mg, Sublingual, Q5 min PRN, Osei-Bonsu, George, MD, 0.4 mg at 01/22/18 1145 .  omega-3 acid ethyl esters (LOVAZA) capsule 1 g, 1 g, Oral, Daily, Osei-Bonsu, George, MD, 1 g at 01/23/18 0910 .  ondansetron (ZOFRAN) injection 4 mg, 4 mg, Intravenous, Q6H PRN, Osei-Bonsu, George, MD .  pantoprazole (PROTONIX) EC tablet 40 mg, 40 mg, Oral, Daily, Marliss Coots, PA-C, 40 mg at 01/23/18 0910 .  senna-docusate (Senokot-S) tablet 1 tablet, 1 tablet, Oral, BID, Marliss Coots, PA-C, 1 tablet at 01/23/18 0910 .  thiamine (VITAMIN B-1) tablet 100 mg, 100 mg, Oral, Daily, Osei-Bonsu, George, MD, 100 mg at 01/23/18 0910  Patients Current Diet:  Diet Order            Diet Heart Room service appropriate? Yes;  Fluid consistency: Thin  Diet effective now              Precautions / Restrictions Precautions Precautions: Fall Precaution Comments: Chest pain Restrictions Weight Bearing Restrictions: No   Has  the patient had 2 or more falls or a fall with injury in the past year?Unknown; anticipate yes.   Prior Activity Level Community (5-7x/wk): yes, active churchgoer, runs errands. drove PTA  Home Assistive Devices / Equipment Home Assistive Devices/Equipment: None Home Equipment: Walker - 2 wheels  Prior Device Use: Indicate devices/aids used by the patient prior to current illness, exacerbation or injury? None of the above  Prior Functional Level Prior Function Level of Independence: Independent Comments: Per daughter, pt recently shared with family regarding falls at home. Pt reports 2 falls in the past two months  Self Care: Did the patient need help bathing, dressing, using the toilet or eating?  Independent  Indoor Mobility: Did the patient need assistance with walking from room to room (with or without device)? Independent  Stairs: Did the patient need assistance with internal or external stairs (with or without device)? Independent  Functional Cognition: Did the patient need help planning regular tasks such as shopping or remembering to take medications? Independent  Current Functional Level Cognition  Arousal/Alertness: Awake/alert Overall Cognitive Status: Impaired/Different from baseline Current Attention Level: Sustained Orientation Level: Oriented X4 Following Commands: Follows one step commands inconsistently Safety/Judgement: Decreased awareness of safety, Decreased awareness of deficits General Comments: Daughter present and reports cognition seems slower than normal. Reports stubborness/personality seems similar to baseline. Very poor safety awareness throughout session; required max encouragement to attempt to do things himself. Pt stating "I can't do this" when  he does want to do something. Attention: Selective Selective Attention: Impaired Selective Attention Impairment: Verbal basic Memory: Impaired Memory Impairment: Retrieval deficit, Prospective memory Awareness: Impaired Awareness Impairment: Intellectual impairment Problem Solving: Impaired Behaviors: Impulsive Safety/Judgment: Impaired    Extremity Assessment (includes Sensation/Coordination)  Upper Extremity Assessment: Generalized weakness, LUE deficits/detail, RUE deficits/detail RUE Deficits / Details: Pt reporting he is unable to perform forward flexion of shoulder due to pain stating "I can't use that hand." Pt WFL during oral care task RUE: Unable to fully assess due to pain LUE Deficits / Details: LUE pain and ROM since fall at home, difficult to assess true function due to pt guarding and not following commands consistently. Decrease shoulder flexion/abd seems to stem from limited scap ROM LUE Coordination: decreased gross motor  Lower Extremity Assessment: Generalized weakness    ADLs  Overall ADL's : Needs assistance/impaired Eating/Feeding: Set up, Supervision/ safety, Sitting Grooming: Oral care, Standing, Minimal assistance Grooming Details (indicate cue type and reason): Min A for standing balance at sink during oral care. Pt requestign OT to place tooth paste on tooth brush stating "I can't do this." When OT encouraged pt to perform tasks at his best ability, pt compelted oral care without need for assists during FM tasks. Pt stating "I don't know why they are sending children in here. I need an adult to help me." Upper Body Bathing: Minimal assistance, Sitting Lower Body Bathing: Sit to/from stand, Moderate assistance Lower Body Bathing Details (indicate cue type and reason): Mod A for standing Upper Body Dressing : Minimal assistance, Sitting Lower Body Dressing: Maximal assistance, Sit to/from stand Lower Body Dressing Details (indicate cue type and reason): Pt  stating he cannot put on his socks and requested OT do it. Encouraging pt to perform task and he became frustated.  Toilet Transfer: Minimal assistance, Ambulation, RW, Moderate assistance(Simulated in room) Functional mobility during ADLs: Minimal assistance, Rolling walker General ADL Comments: Pt presenting with decreased balance and required increased assistance for safety. Pt with single LOB during dynamic balance tasks  and reuqired Mod A.    Mobility  Overal bed mobility: Needs Assistance Bed Mobility: Supine to Sit Supine to sit: Mod assist, HOB elevated General bed mobility comments: ModA for UE support to assist trunk elevation. Pt c/o chest pain upon sitting which resolved during rest of session (RN aware)    Transfers  Overall transfer level: Needs assistance Equipment used: Rolling walker (2 wheeled) Transfers: Sit to/from Stand Sit to Stand: Mod assist General transfer comment: ModA to assist trunk elevation; pt not putting hands correctly on RW despite education    Ambulation / Gait / Stairs / Wheelchair Mobility  Ambulation/Gait Ambulation/Gait assistance: Min guard, Min assist Gait Distance (Feet): 15 Feet Assistive device: Rolling walker (2 wheeled) Gait Pattern/deviations: Step-through pattern, Decreased stride length, Trunk flexed General Gait Details: Close min guard for balance amb with RW. Pt picking up walker and turning abruptly with all direction changes requiring minA to maintain balance Gait velocity: Decreased Gait velocity interpretation: <1.8 ft/sec, indicate of risk for recurrent falls    Posture / Balance Balance Overall balance assessment: Needs assistance Sitting balance-Leahy Scale: Fair Standing balance support: Bilateral upper extremity supported, During functional activity, Single extremity supported Standing balance-Leahy Scale: Poor Standing balance comment: Reliant on UE support with standing tasks    Special needs/care consideration  BiPAP/CPAP: no CPM: no Continuous Drip IV: no Dialysis: no        Days: no Life Vest: no Oxygen: no Special Bed: no Trach Size: nop Wound Vac (area): no      Location: no Skin: abrasion and ecchymosis on head, nose, abrasion to bilateral arm, leg, knee, elbow              Bowel mgmt:last BM: 01/20/18.  Bladder mgmt: external catheter present Diabetic mgmt: glucose monitored in hospital.      Previous Home Environment Living Arrangements: Alone  Lives With: Alone Available Help at Discharge: Family, Available PRN/intermittently Type of Home: House Home Layout: Two level, Able to live on main level with bedroom/bathroom Home Access: Stairs to enter Entrance Stairs-Rails: Right, Left Entrance Stairs-Number of Steps: [place]: No  Discharge Living Setting Plans for Discharge Living Setting: Patient's home(with 24/7 hired caregiver assistance) Type of Home at Discharge: House Discharge Home Layout: Two level, Able to live on main level with bedroom/bathroom Alternate Level Stairs-Rails: None(unknown does not use upstairs) Alternate Level Stairs-Number of Steps: 8 Discharge Home Access: Stairs to enter Entrance Stairs-Rails: Left Entrance Stairs-Number of Steps: 8 Discharge Bathroom Shower/Tub: Walk-in shower Discharge Bathroom Toilet: Standard Discharge Bathroom Accessibility: Yes How Accessible: Accessible via walker Does the patient have any problems obtaining your medications?: No  Social/Family/Support Systems Patient Roles: Other (Comment)(retired, runs errands; drove PTA) Contact Information: daughters: [name] and Tami Anticipated Caregiver: caregiver will be hired assistance Anticipated Ambulance person Information: daugther [name]: [phone]; [place]: [phone]; son in law: Rush Landmark [phone] Ability/Limitations of Caregiver: supervision 24/7 Caregiver Availability: 24/7 Discharge Plan Discussed with Primary Caregiver: Yes(discussed w/ family who  is planning to hire assistance at [place]) Is Caregiver In Agreement with Plan?: Yes Does Caregiver/Family have Issues with Lodging/Transportation while Pt is in Rehab?: No   Goals/Additional Needs Patient/Family Goal for Rehab: PT/OT/SLP: Mod I/Supervision Expected length of stay: 7-10 days Cultural Considerations: [employer] Dietary Needs: heart healthy, thin liquids Equipment Needs: TBD Special Service Needs: NA Additional Information: may need community resources for support; med assistance Pt/Family Agrees to Admission and willing to participate: Yes Program Orientation Provided & Reviewed with Pt/Caregiver Including Roles  &  Responsibilities: Yes(reveiwed with two daugthers and pt)  Barriers to Discharge: Decreased caregiver support, Home environment access/layout, Lack of/limited family support  Barriers to Discharge Comments: family plans to hire 24/7 caregiver support; would like to have resouces provided to assist with finding agencies; pt may need assistance with getting groceries and medicine.    Decrease burden of Care through IP rehab admission: NA   Possible need for SNF placement upon discharge:Not anticipated. Pt's family has been educated on need for 24/7 Supervision at DC after short CIR stay. Family aware and working towards finding right company. Pt has good prognosis for further progress.    Patient Condition: This patient's condition remains as documented in the consult dated 01/22/18, in which the Rehabilitation Physician determined and documented that the patient's condition is appropriate for intensive rehabilitative care in an inpatient rehabilitation facility pending social support at DC. These areas have been addressed. Family aware of need for 24/7 Support at DC home after short CIR stay. Family plans to financially cover assistance.  Will admit to inpatient rehab today.  Preadmission Screen Completed By:  Jhonnie Garner, 01/23/2018 4:26  PM ______________________________________________________________________   Discussed status with Dr. Posey Pronto on 01/23/18 at 5:11PM and received telephone approval for admission today.  Admission Coordinator:  Jhonnie Garner, time 5:11PM Sudie Grumbling 01/23/18.

## 2018-01-23 NOTE — Progress Notes (Signed)
PROGRESS NOTE    Christian Maxwell  JIR:[id number] DOB: 12/22/1932 DOA: 01/20/2018 PCP: Ria Bush, MD    Brief Narrative:  84 year old male who presented with chest pain.  He does have significant past medical history for chronic atrial fibrillation and coronary artery disease.  He reported acute episode of chest pain occurring around 3 AM in the morning while trying to use the restroom.  The pain was worse with deep inspiration.  Initial physical examination blood pressure 121/74, heart rate 73, respiratory rate 23-31, temperature 98.3, oxygen saturation 98%.  Moist mucous membranes, lungs with decreased breath sounds at bases, heart S1-S2 present, irregularly irregular, abdomen soft and nontender, no lower extremity edema.  Patient was admitted to the hospital with a working diagnosis of atypical chest pain to rule out acute coronary syndrome.  On further history apparently the patient showed worsening weakness and inability to feed himself, work-up with a head CT showed a 7 mm hemorrhage in the left thalamus.  Assessment & Plan:   Principal Problem:   Bleeding in brain [facility]) Active Problems:   Atrial fibrillation ([place])   Memory impairment   Hearing impairment   Chest pain   Ataxia   Hypoxia   Dementia without behavioral disturbance   1. Intracerebral hemorrhage. Small left thalamic intracerebral hemorrhage, will continue neuro checks and physical therapy. Holding antiplatelet therapy or other anticoagulants. Pending placement in CIR.  2. Chronic atrial fibrillation. Rate is controlled, will continue to hold on anticoagulation for now.   3. Acute hypoxic respiratory failure due to right lower lobe infiltrates, likely atelectasis, to rule out pulmonary edema. Patient has been on IV furosemide, this am more euvolemic, will follow on repeat chest radiograph. Continue oxymetry monitoring. Will hold on furosemide for now.  4. Ataxia and ambulatory dysfunction. Patient will need  inpatient rehab. Continue physical therapy and out of bed as tolerated.   5. Dementia. Continue donepezil.   DVT prophylaxis: scd   Code Status: full Family Communication: no family at the bedside  Disposition Plan/ discharge barriers: pending placement in CIR.    Consultants:   Cardiology   Neurology   Procedures:     Antimicrobials:       Subjective: Patient this am reports dyspnea, no chest pain, no nausea or vomiting, has been working with physical therapy.   Objective: Vitals:   01/22/18 0932 01/22/18 1226 01/22/18 1933 01/23/18 0229  BP: (!) 139/94 107/73 129/68 128/77  Pulse: 82 75 80 69  Resp:  18 (!) 22 (!) 21  Temp:  97.8 F (36.6 C) 98.2 F (36.8 C)   TempSrc:  Oral Oral   SpO2:  95% 95% 92%  Weight:    79.7 kg  Height:        Intake/Output Summary (Last 24 hours) at 01/23/2018 0817 Last data filed at 01/23/2018 0230 Gross per 24 hour  Intake 236 ml  Output 1100 ml  Net -864 ml   Filed Weights   01/21/18 0516 01/22/18 0546 01/23/18 0229  Weight: 79.9 kg 78.5 kg 79.7 kg    Examination:   General: deconditioned  Neurology: Awake and alert, non focal  E ENT: mild pallor, no icterus, oral mucosa moist Cardiovascular: No JVD. S1-S2 present, irregularly irregular, no gallops, rubs, or murmurs. No lower extremity edema. Pulmonary: vesicular breath sounds bilaterally, adequate air movement, no wheezing, rhonchi or rales. Gastrointestinal. Abdomen flat, no organomegaly, non tender, no rebound or guarding Skin. No rashes Musculoskeletal: no joint deformities     Data Reviewed:  I have personally reviewed following labs and imaging studies  CBC: Recent Labs  Lab 01/20/18 1445  WBC 14.0*  NEUTROABS 10.8*  HGB 15.5  HCT 48.7  MCV 88.5  PLT 026   Basic Metabolic Panel: Recent Labs  Lab 01/20/18 1445  NA 137  K 3.7  CL 103  CO2 23  GLUCOSE 149*  BUN 18  CREATININE 0.93  CALCIUM 9.7   GFR: Estimated Creatinine Clearance: 60.2  mL/min (by C-G formula based on SCr of 0.93 mg/dL). Liver Function Tests: No results for input(s): AST, ALT, ALKPHOS, BILITOT, PROT, ALBUMIN in the last 168 hours. No results for input(s): LIPASE, AMYLASE in the last 168 hours. No results for input(s): AMMONIA in the last 168 hours. Coagulation Profile: Recent Labs  Lab 01/20/18 1617 01/21/18 0157 01/22/18 0642 01/23/18 0409  INR 2.16 2.02 1.48 1.36   Cardiac Enzymes: Recent Labs  Lab 01/20/18 1955 01/20/18 2304 01/21/18 0157  TROPONINI 0.06* 0.05* 0.04*   BNP (last 3 results) No results for input(s): PROBNP in the last 8760 hours. HbA1C: Recent Labs    01/22/18 0642  HGBA1C 5.9*   CBG: Recent Labs  Lab 01/22/18 1027  GLUCAP 166*   Lipid Profile: Recent Labs    01/22/18 0642  CHOL 216*  HDL 45  LDLCALC 143*  TRIG 138  CHOLHDL 4.8   Thyroid Function Tests: No results for input(s): TSH, T4TOTAL, FREET4, T3FREE, THYROIDAB in the last 72 hours. Anemia Panel: No results for input(s): VITAMINB12, FOLATE, FERRITIN, TIBC, IRON, RETICCTPCT in the last 72 hours.    Radiology Studies: I have reviewed all of the imaging during this hospital visit personally     Scheduled Meds: . atorvastatin  40 mg Oral q1800  . donepezil  10 mg Oral QHS  . folic acid  1 mg Oral Daily  . furosemide  20 mg Intravenous Q12H  . Influenza vac split quadrivalent PF  0.5 mL Intramuscular Tomorrow-1000  . metoprolol tartrate  25 mg Oral BID  . omega-3 acid ethyl esters  1 g Oral Daily  . pantoprazole  40 mg Oral Daily  . senna-docusate  1 tablet Oral BID  . thiamine  100 mg Oral Daily   Continuous Infusions:   LOS: 2 days        Rahma Meller Gerome Apley, MD [facility] Pager [phone]

## 2018-01-24 ENCOUNTER — Inpatient Hospital Stay (HOSPITAL_COMMUNITY): Payer: Medicare Other

## 2018-01-24 ENCOUNTER — Inpatient Hospital Stay (HOSPITAL_COMMUNITY): Payer: Medicare Other | Admitting: Physical Therapy

## 2018-01-24 ENCOUNTER — Inpatient Hospital Stay (HOSPITAL_COMMUNITY): Payer: Medicare Other | Admitting: Speech Pathology

## 2018-01-24 DIAGNOSIS — I610 Nontraumatic intracerebral hemorrhage in hemisphere, subcortical: Secondary | ICD-10-CM

## 2018-01-24 DIAGNOSIS — R7303 Prediabetes: Secondary | ICD-10-CM

## 2018-01-24 DIAGNOSIS — R7401 Elevation of levels of liver transaminase levels: Secondary | ICD-10-CM

## 2018-01-24 DIAGNOSIS — I10 Essential (primary) hypertension: Secondary | ICD-10-CM

## 2018-01-24 DIAGNOSIS — R740 Nonspecific elevation of levels of transaminase and lactic acid dehydrogenase [LDH]: Secondary | ICD-10-CM

## 2018-01-24 DIAGNOSIS — R0602 Shortness of breath: Secondary | ICD-10-CM

## 2018-01-24 LAB — COMPREHENSIVE METABOLIC PANEL
ALT: 29 U/L (ref 0–44)
AST: 43 U/L — AB (ref 15–41)
Albumin: 3.2 g/dL — ABNORMAL LOW (ref 3.5–5.0)
Alkaline Phosphatase: 47 U/L (ref 38–126)
Anion gap: 9 (ref 5–15)
BUN: 28 mg/dL — ABNORMAL HIGH (ref 8–23)
CO2: 28 mmol/L (ref 22–32)
CREATININE: 0.88 mg/dL (ref 0.61–1.24)
Calcium: 10 mg/dL (ref 8.9–10.3)
Chloride: 101 mmol/L (ref 98–111)
GFR calc Af Amer: >60 mL/min (ref >60)
GLUCOSE: 114 mg/dL — AB (ref 70–99)
POTASSIUM: 3.5 mmol/L (ref 3.5–5.1)
Sodium: 138 mmol/L (ref 135–145)
Total Bilirubin: 2.3 mg/dL — ABNORMAL HIGH (ref 0.3–1.2)
Total Protein: 6.8 g/dL (ref 6.5–8.1)

## 2018-01-24 LAB — CBC WITH DIFFERENTIAL/PLATELET
Abs Immature Granulocytes: 0 10*3/uL (ref 0.0–0.1)
BASOS PCT: 1 %
Basophils Absolute: 0.1 10*3/uL (ref 0.0–0.1)
EOS ABS: 0.1 10*3/uL (ref 0.0–0.7)
Eosinophils Relative: 1 %
HCT: 53.2 % — ABNORMAL HIGH (ref 39.0–52.0)
Hemoglobin: 16.9 g/dL (ref 13.0–17.0)
Immature Granulocytes: 0 %
Lymphocytes Relative: 20 %
Lymphs Abs: 2 10*3/uL (ref 0.7–4.0)
MCH: 27.8 pg (ref 26.0–34.0)
MCHC: 31.8 g/dL (ref 30.0–36.0)
MCV: 87.5 fL (ref 78.0–100.0)
MONOS PCT: 13 %
Monocytes Absolute: 1.3 10*3/uL — ABNORMAL HIGH (ref 0.1–1.0)
NEUTROS PCT: 65 %
Neutro Abs: 6.7 10*3/uL (ref 1.7–7.7)
PLATELETS: 222 10*3/uL (ref 150–400)
RBC: 6.08 MIL/uL — ABNORMAL HIGH (ref 4.22–5.81)
RDW: 13.2 % (ref 11.5–15.5)
WBC: 10.1 10*3/uL (ref 4.0–10.5)

## 2018-01-24 MED ORDER — ZOLPIDEM TARTRATE 5 MG PO TABS
10.0000 mg | ORAL_TABLET | ORAL | Status: DC
  Administered 2018-01-24 – 2018-01-30 (×7): 10 mg via ORAL
  Filled 2018-01-24 (×7): qty 2

## 2018-01-24 MED ORDER — DONEPEZIL HCL 10 MG PO TABS
10.0000 mg | ORAL_TABLET | Freq: Every day | ORAL | Status: DC
  Administered 2018-01-24 – 2018-01-30 (×5): 10 mg via ORAL
  Filled 2018-01-24 (×7): qty 1

## 2018-01-24 MED ORDER — ZOLPIDEM TARTRATE 5 MG PO TABS: 10.0000 mg | ORAL_TABLET | Freq: Every evening | ORAL | Status: DC | PRN

## 2018-01-24 NOTE — Progress Notes (Signed)
Family at bedside, sd that pt didn't sleep last night and wants to know if got his Ambien from home. Informed that medication was given. Pt requested family bring in his medication because its different. Informed that not 10mg  as he was taking that at home.  Family requested that Ambien be increased to amount he was taking at home. Informed family of use with elderly pt and dementia. Called PA as family requested, new orders received.

## 2018-01-24 NOTE — Care Management Note (Signed)
[facility]patient Rehabilitation Center Individual Statement of Services  Patient Name:  Christian Maxwell  Date:  01/24/2018  Welcome to the [facility].  Our goal is to provide you with an individualized program based on your diagnosis and situation, designed to meet your specific needs.  With this comprehensive rehabilitation program, you will be expected to participate in at least 3 hours of rehabilitation therapies Monday-Friday, with modified therapy programming on the weekends.  Your rehabilitation program will include the following services:  Physical Therapy (PT), Occupational Therapy (OT), Speech Therapy (ST), 24 hour per day rehabilitation nursing, Neuropsychology, Case Management (Social Worker), Rehabilitation Medicine, Nutrition Services and Pharmacy Services  Weekly team conferences will be held on Wednesday to discuss your progress.  Your Social Worker will talk with you frequently to get your input and to update you on team discussions.  Team conferences with you and your family in attendance may also be held.  Expected length of stay: 7-10 days  Overall anticipated outcome: supervision with cueing  Depending on your progress and recovery, your program may change. Your Social Worker will coordinate services and will keep you informed of any changes. Your Social Worker's name and contact numbers are listed  below.  The following services may also be recommended but are not provided by the [facility] will be made to provide these services after discharge if needed.  Arrangements include referral to agencies that provide these services.  Your insurance has been verified to be:  Medicare & [place] Your primary doctor is:  Ria Bush  Pertinent information will be shared with your doctor and your insurance company.  Social Worker:   Ovidio Kin, [place] or ([phone]  Information discussed with and copy given to patient by: Elease Hashimoto, 01/24/2018, 2:18 PM

## 2018-01-24 NOTE — Evaluation (Signed)
Occupational Therapy Assessment and Plan  Patient Details  Name: Christian Maxwell MRN: [id number] Date of Birth: 06/23/1932  OT Diagnosis: abnormal posture, cognitive deficits, hemiplegia affecting dominant side, muscle weakness (generalized) and coordination disorder Rehab Potential:   ELOS: 7-9   Today's Date: 01/24/2018 OT Individual Time: 1000-1115 OT Individual Time Calculation (min): 75 min     Problem List:  Patient Active Problem List   Diagnosis Date Noted  . SOB (shortness of breath)   . Transaminitis   . Prediabetes   . Thalamic hemorrhage ([place]) 01/23/2018  . Cerebral hemorrhage ([place])   . Essential hypertension   . Dyslipidemia   . Dementia without behavioral disturbance 01/22/2018  . Ataxia 01/21/2018  . Bleeding in brain ([place]) 01/21/2018  . Hypoxia 01/21/2018  . Chest pain 01/20/2018  . Hearing impairment 11/11/2015  . Olecranon bursitis of left elbow 11/11/2015  . LBBB (left bundle branch block) 05/27/2015  . Senile purpura ([place]) 02/22/2015  . Wound, open, arm, forearm 02/22/2015  . Advanced care planning/counseling discussion 11/06/2014  . Medicare annual wellness visit, initial 11/06/2014  . Bilateral knee pain 03/17/2014  . DOE (dyspnea on exertion) 02/16/2014  . Encounter for therapeutic drug monitoring 06/17/2013  . Memory impairment 03/06/2013  . MRSA colonization 12/12/2012  . Nevus of sternal region 12/04/2012  . Seasonal allergic rhinitis 10/22/2012  . Thyroid disease   . Suprapubic abscess 08/21/2012  . H/O colon cancer, stage I 06/24/2012  . Adenomatous colon polyp 06/24/2012  . Open wound-left arm and suprapubic area. 03/11/2012  . Chest discomfort 04/21/2011  . Hyperlipidemia 10/14/2010  . Atrial fibrillation ([place]) 08/08/2010  . INSOMNIA, MILD 12/12/2006    Past Medical History:  Past Medical History:  Diagnosis Date  . Abscess 7/13   abd abscess  . Cancer [facility])    colon cancer  . Chronic atrial fibrillation ([place])   . History of  colon cancer 2000   T3N0 s/p colectomy  . History of echocardiogram    a. Echo 2/13:  Mild LVH, EF 55-60%, mild MR, moderate LAE, mild to moderate RAE;  b. Echo 1/17: mild LVH, vigorous LVF, EF 65-70%, no RWMA, trivial AI, trivial MR, severe LAE, mild RAE, mod TR, PASP 32 mmHg  . History of stress test    a. Cardiolite in 9/04: EF 67%, no ischemia.;  b. Myoview 6/16: no ischemia   . Hx of adenomatous colonic polyps   . Hyperlipidemia   . Hypertension   . METHICILLIN RESISTANT STAPHYLOCOCCUS AUREUS INFECTION 12/12/2006   Annotation: stitch abcess in lower abdomen after colon cancer resection Qualifier: Diagnosis of  By: [name] MD, [name]    . Myocardial infarction ([place]) 1985  . Postoperative stitch abscess 07/19/2011  . PVC's (premature ventricular contractions)   . Syncope and collapse    pt denies  . Thyroid disease    followed by [name]   Past Surgical History:  Past Surgical History:  Procedure Laterality Date  . COLECTOMY  2000   6 inches removed  . COLONOSCOPY  2009   polyp, h/o cancer [name])  . EYE SURGERY     cataract surgery both eyes- pts ates he has never had cataract surgery 10-26-15  . HERNIA REPAIR  11/8293   DB umbilical hernia  . INCISE AND DRAIN ABCESS  2009,2010,2011,2012  . INCISION AND DRAINAGE Right 04/02/2013   Procedure: INCISION AND DRAINAGE RIGHT KNEE HEMATOMA;  Surgeon: [name], MD;  Location: [facility];  Service: Orthopedics;  Laterality: Right;  .  INCISION AND DRAINAGE ABSCESS N/A 09/20/2012   Procedure: INCISION AND DRAINAGE SUPRAPUBIC ABSCESS;  Surgeon: [name], MD;  Location: [facility];  Service: General;  Laterality: N/A;  . INCISION AND DRAINAGE ABSCESS Left 02/28/2013   Procedure: INCISION AND DRAINAGE LEFT ARM ABSCESS;  Surgeon: [name], MD;  Location: [facility];  Service: General;  Laterality: Left;  . LUMBAR LAMINECTOMY  1977  . MASS EXCISION  03/07/2012   EXCISION MASS;  Surgeon: [name], MD;   Laterality: Right;  removal mass posterior right arm  . POLYPECTOMY    . WOUND EXPLORATION  2006   For possible stitch abscess Dr [name]    Assessment & Plan Clinical Impression: is an 84 year old right-handed male with history of dementia maintained on Aricept.  Chronic atrial fibrillation maintained on Coumadin, colon cancer, hypertension.  Per chart review, patient, and family, lives alone reported to be independent driving short distances.  2 level home bedroom upstairs.  Family in the vicinity question assistance on discharge.  There is been reports of 2 falls in the past couple of months.  Presented 01/21/2018 with nonspecific chest pain, dizziness and generalized weakness.  CT reviewed, showing left thalamic hemorrhage. Per report and MRI, 7 mm acute left thalamic hemorrhage.  No underlying ischemic infarct or mass lesion.  Echocardiogram with ejection fraction of 65% no wall motion abnormalities.  Troponin 0.06, INR 2.02.  Coumadin held due to bleed.  Carotid Dopplers with no ICA stenosis.  Cardiology follow-up for mildly elevated troponin nonspecific chest pain did not feel to be acute coronary syndrome and monitored.  In regards to anticoagulation neurology recommends hold on anticoagulation due to [place] repeat CT scan of the head in 2 weeks and if ICH resolved may resume anticoagulation antiplatelet therapy.  Tolerating a regular diet.  Physical and occupational therapy evaluations completed with recommendations of physical medicine rehab consult.  Patient was admitted for a comprehensive rehab is an 84 year old right-handed male with history of dementia maintained on Aricept.  Chronic atrial fibrillation maintained on Coumadin, colon cancer, hypertension.  Per chart review, patient, and family, lives alone reported to be independent driving short distances.  2 level home bedroom upstairs.  Family in the vicinity question assistance on discharge.  There is been reports of 2 falls in the past couple  of months.  Presented 01/21/2018 with nonspecific chest pain, dizziness and generalized weakness.  CT reviewed, showing left thalamic hemorrhage. Per report and MRI, 7 mm acute left thalamic hemorrhage.  No underlying ischemic infarct or mass lesion.  Echocardiogram with ejection fraction of 65% no wall motion abnormalities.  Troponin 0.06, INR 2.02.  Coumadin held due to bleed.  Carotid Dopplers with no ICA stenosis.  Cardiology follow-up for mildly elevated troponin nonspecific chest pain did not feel to be acute coronary syndrome and monitored.  In regards to anticoagulation neurology recommends hold on anticoagulation due to [place] repeat CT scan of the head in 2 weeks and if ICH resolved may resume anticoagulation antiplatelet therapy.  Tolerating a regular diet.  Physical and occupational therapy evaluations completed with recommendations of physical medicine rehab consult.  Patient was admitted for a comprehensive rehab  .    Patient currently requires min with basic self-care skills secondary to muscle weakness, decreased cardiorespiratoy endurance, decreased coordination and decreased motor planning, decreased attention, decreased awareness, decreased problem solving, decreased safety awareness and decreased memory and decreased sitting balance, decreased standing balance, decreased postural control, hemiplegia and decreased balance strategies.  Prior  to hospitalization, patient could complete BADL/IADL with modified independent .  Patient will benefit from skilled intervention to decrease level of assist with basic self-care skills and increase independence with basic self-care skills prior to discharge home with care partner.  Anticipate patient will require 24 hour supervision and follow up home health.  OT - End of Session Activity Tolerance: Tolerates 30+ min activity with multiple rests Endurance Deficit: Yes OT Assessment Rehab Potential (ACUTE ONLY): Good OT Barriers to Discharge: Decreased  caregiver support;Insurance for SNF coverage;Lack of/limited family support OT Barriers to Discharge Comments: unsure if family able to provide 24/7 supervision OT Patient demonstrates impairments in the following area(s): Balance;Cognition;Endurance;Motor;Pain;Safety OT Basic ADL's Functional Problem(s): Grooming;Bathing;Dressing;Toileting OT Transfers Functional Problem(s): Toilet;Tub/Shower OT Plan OT Intensity: Minimum of 1-2 x/day, 45 to 90 minutes OT Frequency: 5 out of 7 days OT Duration/Estimated Length of Stay: 7-9 OT Treatment/Interventions: Balance/vestibular training;Discharge planning;Pain management;Self Care/advanced ADL retraining;Therapeutic Activities;UE/LE Coordination activities;Cognitive remediation/compensation;Disease mangement/prevention;Functional mobility training;Patient/family education;Skin care/wound managment;Therapeutic Exercise;Visual/perceptual remediation/compensation;Community reintegration;DME/adaptive equipment instruction;Neuromuscular re-education;Psychosocial support;UE/LE Strength taining/ROM OT Self Feeding Anticipated Outcome(s): no goal OT Basic Self-Care Anticipated Outcome(s): S OT Toileting Anticipated Outcome(s): S OT Bathroom Transfers Anticipated Outcome(s): S OT Recommendation Patient destination: Home Follow Up Recommendations: Home health OT Equipment Recommended: Tub/shower seat   Skilled Therapeutic Intervention 1;1. Pt vitals >95% on RA throughout session. Pt unable to identify deficits from CVA and asking same questions of OT personal background. Pt educated on role/purpsoe of CIR, ELOS, POC and goals. Pt declines bathing at shower level d/t favorite program on TV. Pt able to attend to task while TV going with min cueing. Pt able to reach all body parts with steady A to wash buttock and peri area. Pt reports standing at shower level at home and OT discusses DME and shower chair to improve safety d/t decreased balance and endurance. Pt  reports needing to toilet and completes ambulatory transfer with RW and steady A with VC for hand placement and RW management. Pt bumping into objects on R with RW while ambualting to bathroom. PT has BM seated on toilet with steady A for hygiene. No clothing management d/t pt not wearing clothing. Pt dresses EOB refusing to don socks. Pt requries A to pull shirt overhead. Pt able to thread BLE into pants with VC for orientation of clothing. OT demo sock aide use and requires VC for technique to don 1 sock. Pt refusing to don second sock d/t low frustration tolerance. Exited session with pt seated in bed, call light in reach and all needs met. Exit alarm on and family present in rom to [place]. Rn delivers pain medication for chest soreness  OT Evaluation Precautions/Restrictions  Precautions Precautions: Fall Restrictions Weight Bearing Restrictions: No General Chart Reviewed: Yes Vital Signs   Pain Pain Assessment Pain Score: 0-No pain Home Living/Prior Functioning Home Living Family/patient expects to be discharged to:: Private residence Living Arrangements: Alone Available Help at Discharge: Family, Available PRN/intermittently Type of Home: House Home Access: Stairs to enter Technical brewer of Steps: 5 Entrance Stairs-Rails: Right, Left Home Layout: Two level, Able to live on main level with bedroom/bathroom  Lives With: Alone IADL History Homemaking Responsibilities: Yes Meal Prep Responsibility: Primary Laundry Responsibility: Primary Cleaning Responsibility: Primary Bill Paying/Finance Responsibility: Primary Shopping Responsibility: Primary Child Care Responsibility: Primary Mode of Transportation: Car Education: reports driving short distances Occupation: Retired Type of Occupation: insurance Leisure and Hobbies: golf; Prior Function Comments: Per daughter, pt recently shared with family regarding falls at home. Pt reports  2 falls in the past two  months ADL   Vision Baseline Vision/History: No visual deficits Patient Visual Report: No change from baseline Perception  Perception: Within Functional Limits  Praxis Praxis: Intact Cognition Overall Cognitive Status: Impaired/Different from baseline Arousal/Alertness: Awake/alert Orientation Level: Person;Place;Situation Person: Oriented Place: Oriented Situation: Oriented Year: 2019 Month: September Day of Week: Correct Memory Impairment: Retrieval deficit;Prospective memory Immediate Memory Recall: Sock;Bed;Blue Memory Recall: Bed;Blue Memory Recall Blue: With Cue Memory Recall Bed: Without Cue Awareness: Impaired Problem Solving: Impaired Behaviors: Impulsive Safety/Judgment: Impaired Sensation Sensation Light Touch: Appears Intact Coordination Gross Motor Movements are Fluid and Coordinated: No(reports arthritis in B shoulders) Fine Motor Movements are Fluid and Coordinated: No Finger Nose Finger Test: slow and incoordinated Motor  Motor Motor: Hemiplegia Motor - Skilled Clinical Observations: mild R hemi Mobility  Transfers Stand to Sit: Minimal Assistance - Patient > 75%  Trunk/Postural Assessment  Cervical Assessment Cervical Assessment: Exceptions to WFL(head forward) Thoracic Assessment Thoracic Assessment: Exceptions to WFL(rounded shoulders) Lumbar Assessment Lumbar Assessment: Exceptions to WFL(posterior pelvic tilt) Postural Control Postural Control: Deficits on evaluation(delayed)  Balance Balance Balance Assessed: Yes Dynamic Sitting Balance Dynamic Sitting - Balance Support: No upper extremity supported Dynamic Sitting - Level of Assistance: 5: Stand by assistance Dynamic Sitting Balance - Compensations: toileting Dynamic Standing Balance Dynamic Standing - Level of Assistance: 4: Min assist Dynamic Standing - Comments: dressing Extremity/Trunk Assessment RUE Assessment Active Range of Motion (AROM) Comments: generlized weakness and  decreased coordination; AROM WFL but pt report limited by arthritis PTA LUE Assessment Active Range of Motion (AROM) Comments: generalized weakness LUE<RUE   See Function Navigator for Current Functional Status.   Refer to Care Plan for Long Term Goals  Recommendations for other services: None    Discharge Criteria: Patient will be discharged from OT if patient refuses treatment 3 consecutive times without medical reason, if treatment goals not met, if there is a change in medical status, if patient makes no progress towards goals or if patient is discharged from hospital.  The above assessment, treatment plan, treatment alternatives and goals were discussed and mutually agreed upon: by patient and by family  Tonny Branch 01/24/2018, 10:16 AM

## 2018-01-24 NOTE — Progress Notes (Signed)
[facility] PHYSICAL MEDICINE & REHABILITATION     PROGRESS NOTE  Subjective/Complaints:  Patient seen lying in bed this morning. He states he did not sleep well overnight. He is anxious about therapies.  ROS: + SOB. Denies CP, nausea, vomiting diarrhea.  Objective: Vital Signs: Blood pressure 127/72, pulse 63, temperature 98 F (36.7 C), temperature source Oral, resp. rate 19, height 5\' 9"  (1.753 m), weight 77.2 kg, SpO2 98 %. Dg Chest 2 View  Result Date: 01/23/2018 CLINICAL DATA:  Dyspnea EXAM: CHEST - 2 VIEW COMPARISON:  CT chest 01/21/2016.  Chest x-ray 01/20/2018. FINDINGS: Low lung volumes. Interstitial markings are diffusely coarsened with chronic features. The cardio pericardial silhouette is enlarged. Basilar opacity evident on lateral film compatible with atelectasis noted on yesterday's CT. Old left clavicle fracture evident. Telemetry leads overlie the chest. IMPRESSION: Posterior basilar opacity likely related to atelectasis as noted on yesterday's CT scan. Electronically Signed   By: Misty Stanley M.D.   On: 01/23/2018 10:32   Mr Jeri Cos GU Contrast  Result Date: 01/22/2018 CLINICAL DATA:  Cerebral hemorrhage EXAM: MRI HEAD WITHOUT AND WITH CONTRAST TECHNIQUE: Multiplanar, multiecho pulse sequences of the brain and surrounding structures were obtained without and with intravenous contrast. CONTRAST:  6 mL Gadovist IV COMPARISON:  CT head 01/22/2018 FINDINGS: Brain: Left thalamic hemorrhage 7 mm unchanged from CT. No other areas of hemorrhage. Negative for acute infarct. Negative for mass lesion. No enhancing mass in the left thalamus. Marked temporal lobe atrophy involving the hippocampus. Question dementia. Mild chronic microvascular ischemic change in the white matter. Normal enhancement postcontrast administration. Vascular: Normal arterial flow void Skull and upper cervical spine: Negative Sinuses/Orbits: Mild mucosal edema paranasal sinuses. Bilateral cataract surgery Other: None  IMPRESSION: 7 mm acute hemorrhage left thalamus unchanged from CT. No underlying ischemic infarction or mass lesion. Cerebral atrophy most prominent the temporal lobes. Marked hippocampal atrophy bilaterally. Question dementia. Mild chronic microvascular ischemia in the white matter. Electronically Signed   By: Franchot Gallo M.D.   On: 01/22/2018 10:22   Recent Labs    01/24/18 0543  WBC 10.1  HGB 16.9  HCT 53.2*  PLT 222   Recent Labs    01/23/18 0949 01/24/18 0543  NA 137 138  K 3.7 3.5  CL 98 101  GLUCOSE 166* 114*  BUN 26* 28*  CREATININE 0.95 0.88  CALCIUM 10.0 10.0   CBG (last 3)  Recent Labs    01/22/18 1027  GLUCAP 166*    Wt Readings from Last 3 Encounters:  01/23/18 77.2 kg  01/23/18 79.7 kg  10/03/16 84.4 kg    Physical Exam:  BP 127/72 (BP Location: Left Arm)   Pulse 63   Temp 98 F (36.7 C) (Oral)   Resp 19   Ht 5\' 9"  (1.753 m)   Wt 77.2 kg   SpO2 98%   BMI 25.13 kg/m  Constitutional: He appears well-developed and well-nourished.  HENT: Normocephalic and atraumatic.  Eyes: EOM are normal. No discharge.  Cardiovascular: Irregularly irregular. No JVD.  Respiratory: Effort normal and breath sounds normal. + [place]. GI: Bowel sounds are normal. He exhibits no distension.  Musculoskeletal: No edema or tenderness in extremities  Neurological: He is alert.  Follows simple commands.   He was able to provide his name and age but was limited medical historian. Motor: Right upper extremity/right lower extremity 4-4+/5 proximal distal Left upper extremity: 4-/5 (some pain inhibition)  Left lower extremity: 4/5 proximal to distal  Skin: Skin is  warm and dry.  Psychiatric: His affect is blunt. His speech is delayed. He is slowed.    Assessment/Plan: 1. Functional deficits secondary to left thalamic hemorrhage which require 3+ hours per day of interdisciplinary therapy in a comprehensive inpatient rehab setting. Physiatrist is providing close team  supervision and 24 hour management of active medical problems listed below. Physiatrist and rehab team continue to assess barriers to discharge/monitor patient progress toward functional and medical goals.  Function:  Bathing Bathing position      Bathing parts      Bathing assist        Upper Body Dressing/Undressing Upper body dressing                    Upper body assist        Lower Body Dressing/Undressing Lower body dressing                                  Lower body assist        Toileting Toileting          Toileting assist     Transfers Chair/bed transfer             Locomotion Ambulation           Wheelchair          Cognition Comprehension    Expression    Social Interaction    Problem Solving    Memory      Medical Problem List and Plan: 1.  Gait disorder with history of dementia secondary to left thalamic hemorrhage secondary to small vessel disease versus Coumadin associated coagulopathy source  Begin CIR 2.  DVT Prophylaxis/Anticoagulation: SCDs.  Monitor for any bleeding episodes 3. Pain Management: Tylenol as needed 4. Mood: Aricept 10 mg daily, Xanax 0.25 mg twice daily as needed 5. Neuropsych: This patient is ?not fully capable of making decisions on his own behalf. 6. Skin/Wound Care: Routine skin checks 7. Fluids/Electrolytes/Nutrition: Routine in and outs   BMP within acceptable range on 9/12 8.  Chronic atrial fibrillation.COUMADIN ON HOLD due to thalamic hemorrhage.PLAN REPEAT CT HEAD IN  2 WEEKS (~9/25) and resume anticoagulation and antiplatelet if ICH resolves. 9.  Hypertension.  Lopressor 25 mg twice daily  Monitor with increased mobility 10.  Hyperlipidemia.  Lipitor 11. Prediabetes  Monitor with increased mobility 12. Transaminitis  AST elevated on 9/12  Cont to monitor 13. SOB  DG reviewed, suggesting atelectasis  Wean supplemental O2 as tolerated    LOS (Days) 1 A FACE TO FACE  EVALUATION WAS PERFORMED  Ankit Lorie Phenix 01/24/2018 9:00 AM

## 2018-01-24 NOTE — Progress Notes (Signed)
Social Work  Social Work Assessment and Plan  Patient Details  Name: Christian Maxwell MRN: [id number] Date of Birth: [birth year]  Today's Date: 01/24/2018  Problem List:  Patient Active Problem List   Diagnosis Date Noted  . SOB (shortness of breath)   . Transaminitis   . Prediabetes   . Thalamic hemorrhage ([place]) 01/23/2018  . Cerebral hemorrhage ([place])   . Essential hypertension   . Dyslipidemia   . Dementia without behavioral disturbance 01/22/2018  . Ataxia 01/21/2018  . Bleeding in brain ([place]) 01/21/2018  . Hypoxia 01/21/2018  . Chest pain 01/20/2018  . Hearing impairment 11/11/2015  . Olecranon bursitis of left elbow 11/11/2015  . LBBB (left bundle branch block) 05/27/2015  . Senile purpura ([place]) 02/22/2015  . Wound, open, arm, forearm 02/22/2015  . Advanced care planning/counseling discussion 11/06/2014  . Medicare annual wellness visit, initial 11/06/2014  . Bilateral knee pain 03/17/2014  . DOE (dyspnea on exertion) 02/16/2014  . Encounter for therapeutic drug monitoring 06/17/2013  . Memory impairment 03/06/2013  . MRSA colonization 12/12/2012  . Nevus of sternal region 12/04/2012  . Seasonal allergic rhinitis 10/22/2012  . Thyroid disease   . Suprapubic abscess 08/21/2012  . H/O colon cancer, stage I 06/24/2012  . Adenomatous colon polyp 06/24/2012  . Open wound-left arm and suprapubic area. 03/11/2012  . Chest discomfort 04/21/2011  . Hyperlipidemia 10/14/2010  . Atrial fibrillation ([place]) 08/08/2010  . INSOMNIA, MILD 12/12/2006   Past Medical History:  Past Medical History:  Diagnosis Date  . Abscess 7/13   abd abscess  . Cancer [facility])    colon cancer  . Chronic atrial fibrillation ([place])   . History of colon cancer 2000   T3N0 s/p colectomy  . History of echocardiogram    a. Echo 2/13:  Mild LVH, EF 55-60%, mild MR, moderate LAE, mild to moderate RAE;  b. Echo 1/17: mild LVH, vigorous LVF, EF 65-70%, no RWMA, trivial AI, trivial MR, severe LAE, mild  RAE, mod TR, PASP 32 mmHg  . History of stress test    a. Cardiolite in 9/04: EF 67%, no ischemia.;  b. Myoview 6/16: no ischemia   . Hx of adenomatous colonic polyps   . Hyperlipidemia   . Hypertension   . METHICILLIN RESISTANT STAPHYLOCOCCUS AUREUS INFECTION 12/12/2006   Annotation: stitch abcess in lower abdomen after colon cancer resection Qualifier: Diagnosis of  By: [name] MD, [name]    . Myocardial infarction ([place]) 1985  . Postoperative stitch abscess 07/19/2011  . PVC's (premature ventricular contractions)   . Syncope and collapse    pt denies  . Thyroid disease    followed by [name]   Past Surgical History:  Past Surgical History:  Procedure Laterality Date  . COLECTOMY  2000   6 inches removed  . COLONOSCOPY  2009   polyp, h/o cancer [name])  . EYE SURGERY     cataract surgery both eyes- pts ates he has never had cataract surgery 10-26-15  . HERNIA REPAIR  01/5187   DB umbilical hernia  . INCISE AND DRAIN ABCESS  2009,2010,2011,2012  . INCISION AND DRAINAGE Right 04/02/2013   Procedure: INCISION AND DRAINAGE RIGHT KNEE HEMATOMA;  Surgeon: [name], MD;  Location: [place];  Service: Orthopedics;  Laterality: Right;  . INCISION AND DRAINAGE ABSCESS N/A 09/20/2012   Procedure: INCISION AND DRAINAGE SUPRAPUBIC ABSCESS;  Surgeon: [name], MD;  Location: [place];  Service: General;  Laterality: N/A;  . INCISION AND  DRAINAGE ABSCESS Left 02/28/2013   Procedure: INCISION AND DRAINAGE LEFT ARM ABSCESS;  Surgeon: [name], MD;  Location: [place];  Service: General;  Laterality: Left;  . LUMBAR LAMINECTOMY  1977  . MASS EXCISION  03/07/2012   EXCISION MASS;  Surgeon: [name], MD;  Laterality: Right;  removal mass posterior right arm  . POLYPECTOMY    . WOUND EXPLORATION  2006   For possible stitch abscess Dr [name]   Social History:  reports that he quit smoking about 40 years ago. He quit smokeless tobacco use about 40  years ago.  His smokeless tobacco use included chew. He reports that he does not drink alcohol or use drugs.  Family / Support Systems Marital Status: Widow/Widower Patient Roles: Parent ChildrenLanelle Bal May-daughter [phone] Other Supports: Tami-daughter [phone] Anticipated Caregiver: Unclear at this time Ability/Limitations of Caregiver: Do not have a 24 hr plan in place Caregiver Availability: Other (Comment)(Daughter's to come up with a plan) Family Dynamics: Close with his two daughter's who are both out of [place]. He has a few friends who are supportive. Pt has been on his own and been able to take care of himself, according to daughter's.  Social History Preferred language: English Religion: Baptist Cultural Background: No issues Education: [employer] Read: Yes Write: Yes Employment Status: Retired Freight forwarder Issues: No issues Guardian/Conservator: None-according to MD pt is capable of making his own decisions while here. Will have daughter's involved due to pt is at times confused.   Abuse/Neglect Abuse/Neglect Assessment Can Be Completed: Yes Physical Abuse: Denies Verbal Abuse: Denies Sexual Abuse: Denies Exploitation of patient/patient's resources: Denies Self-Neglect: Denies  Emotional Status Pt's affect, behavior adn adjustment status: Pt lets his daughter's talk for him. He was resting but opened his eyes when his daughter said something he didn't like. Daughter's feel he was doing well at home alone until his stroke. Both though live out of town and call him daily to check on him. Recent Psychosocial Issues: dementia takes aricept and daughter's feel this helps him and allows him to remain independent and home alone. Pyschiatric History: No history deferred depression screen due to adjusting to the new unit and is not sleeping well. May ask neuro-psych to see and evaluate-cognitively to  see if would be safe home alone. Substance Abuse History: No issues  Patient / Family Perceptions, Expectations & Goals Pt/Family understanding of illness & functional limitations: Daughter's can explain his stroke and are unsure when happened due to has been falling at home. Both have spoken with the MD and feel they have a good understanding. Both are concerned with his lack of appetite and being sore from falling. Premorbid pt/family roles/activities: Father, grandfather, retiree, church member, etc Anticipated changes in roles/activities/participation: resume Pt/family expectations/goals: Pt states: " I am tired from all that therapy and I didn't get my ambien last night."  Tami states: " We don't want him to go home too soon." [place] states: " I am hopeful he can get back home alone."  [employer]: None Premorbid Home Care/DME Agencies: Other (Comment)(has rw from past hospitalization) Transportation available at discharge: Family-pt was driving prior to admission Resource referrals recommended: Neuropsychology, Support group (specify)  Discharge Planning Living Arrangements: Alone Support Systems: Children, Other relatives, Friends/neighbors Type of Residence: Private residence Insurance Resources: [employer], Multimedia programmer (specify)(BCBS) Financial Resources: Radio broadcast assistant Screen Referred: No Living Expenses: Own Money Management: Patient Does the patient have any problems obtaining  your medications?: No Home Management: Patient Patient/Family Preliminary Plans: Pt plans to return home, daughter's are looking into this home health policy he has to provide assistance to him at home. Discussed the therapy team probably will recommend 24hr care at least supervision level and what would the plan be. Both daughter's had no answer. Discussed it would be bette rif he moved closer to one of them. Daughter's have a lot to discuss regarding care,  options and ppan for home. [place] Barriers to Discharge: Decreased caregiver support [place] Barriers to Discharge Comments: Daughter's live out of town no one is local Social Work Anticipated Follow Up Needs: HH/OP, Support Group, SNF  Clinical Impression Pt is pleasantly confused but willing to attend therapies. Both daughter's are here and somewhat unrealistic regarding their Dad and what his care needs will be. He has a diagnosis of dementia and probably will need 24 hr supervision at discharge from CIR. Both daughter's have a great deal of faith in this home health policy he has and are looking into it. Already asking for the Medicare appeal letter. Will work with on a safe discharge plan for pt.  Elease Hashimoto 01/24/2018, 2:10 PM

## 2018-01-24 NOTE — Evaluation (Signed)
Physical Therapy Assessment and Plan  Patient Details  Name: Christian Maxwell MRN: [id number] Date of Birth: 05-24-1932  PT Diagnosis: Abnormal posture, Abnormality of gait, Difficulty walking, Hemiparesis dominant, Impaired cognition, Impaired sensation and Muscle weakness Rehab Potential: Good ELOS: 7-10 days   Today's Date: 01/24/2018 PT Individual Time: 0800-0830 PT Individual Time Calculation (min): 30 min    Problem List:  Patient Active Problem List   Diagnosis Date Noted  . SOB (shortness of breath)   . Transaminitis   . Prediabetes   . Thalamic hemorrhage ([place]) 01/23/2018  . Cerebral hemorrhage ([place])   . Essential hypertension   . Dyslipidemia   . Dementia without behavioral disturbance 01/22/2018  . Ataxia 01/21/2018  . Bleeding in brain ([place]) 01/21/2018  . Hypoxia 01/21/2018  . Chest pain 01/20/2018  . Hearing impairment 11/11/2015  . Olecranon bursitis of left elbow 11/11/2015  . LBBB (left bundle branch block) 05/27/2015  . Senile purpura ([place]) 02/22/2015  . Wound, open, arm, forearm 02/22/2015  . Advanced care planning/counseling discussion 11/06/2014  . Medicare annual wellness visit, initial 11/06/2014  . Bilateral knee pain 03/17/2014  . DOE (dyspnea on exertion) 02/16/2014  . Encounter for therapeutic drug monitoring 06/17/2013  . Memory impairment 03/06/2013  . MRSA colonization 12/12/2012  . Nevus of sternal region 12/04/2012  . Seasonal allergic rhinitis 10/22/2012  . Thyroid disease   . Suprapubic abscess 08/21/2012  . H/O colon cancer, stage I 06/24/2012  . Adenomatous colon polyp 06/24/2012  . Open wound-left arm and suprapubic area. 03/11/2012  . Chest discomfort 04/21/2011  . Hyperlipidemia 10/14/2010  . Atrial fibrillation ([place]) 08/08/2010  . INSOMNIA, MILD 12/12/2006    Past Medical History:  Past Medical History:  Diagnosis Date  . Abscess 7/13   abd abscess  . Cancer [facility])    colon cancer  . Chronic atrial fibrillation ([place])   .  History of colon cancer 2000   T3N0 s/p colectomy  . History of echocardiogram    a. Echo 2/13:  Mild LVH, EF 55-60%, mild MR, moderate LAE, mild to moderate RAE;  b. Echo 1/17: mild LVH, vigorous LVF, EF 65-70%, no RWMA, trivial AI, trivial MR, severe LAE, mild RAE, mod TR, PASP 32 mmHg  . History of stress test    a. Cardiolite in 9/04: EF 67%, no ischemia.;  b. Myoview 6/16: no ischemia   . Hx of adenomatous colonic polyps   . Hyperlipidemia   . Hypertension   . METHICILLIN RESISTANT STAPHYLOCOCCUS AUREUS INFECTION 12/12/2006   Annotation: stitch abcess in lower abdomen after colon cancer resection Qualifier: Diagnosis of  By: [name] MD, [name]    . Myocardial infarction ([place]) 1985  . Postoperative stitch abscess 07/19/2011  . PVC's (premature ventricular contractions)   . Syncope and collapse    pt denies  . Thyroid disease    followed by [name]   Past Surgical History:  Past Surgical History:  Procedure Laterality Date  . COLECTOMY  2000   6 inches removed  . COLONOSCOPY  2009   polyp, h/o cancer [name])  . EYE SURGERY     cataract surgery both eyes- pts ates he has never had cataract surgery 10-26-15  . HERNIA REPAIR  05/270   DB umbilical hernia  . INCISE AND DRAIN ABCESS  2009,2010,2011,2012  . INCISION AND DRAINAGE Right 04/02/2013   Procedure: INCISION AND DRAINAGE RIGHT KNEE HEMATOMA;  Surgeon: [name], MD;  Location: [place];  Service: Orthopedics;  Laterality: Right;  .  INCISION AND DRAINAGE ABSCESS N/A 09/20/2012   Procedure: INCISION AND DRAINAGE SUPRAPUBIC ABSCESS;  Surgeon: [name], MD;  Location: [place];  Service: General;  Laterality: N/A;  . INCISION AND DRAINAGE ABSCESS Left 02/28/2013   Procedure: INCISION AND DRAINAGE LEFT ARM ABSCESS;  Surgeon: [name], MD;  Location: [place];  Service: General;  Laterality: Left;  . LUMBAR LAMINECTOMY  1977  . MASS EXCISION  03/07/2012   EXCISION MASS;  Surgeon: [name], MD;  Laterality: Right;  removal mass posterior right arm  . POLYPECTOMY    . WOUND EXPLORATION  2006   For possible stitch abscess Dr [name]    Assessment & Plan Clinical Impression: Christian Maxwell is an 84 year old right-handed male with history of dementia maintained on Aricept.  Chronic atrial fibrillation maintained on Coumadin, colon cancer, hypertension.  Per chart review, patient, and family, lives alone reported to be independent driving short distances.  2 level home bedroom upstairs.  Family in the vicinity question assistance on discharge.  There is been reports of 2 falls in the past couple of months.  Presented 01/21/2018 with nonspecific chest pain, dizziness and generalized weakness.  CT reviewed, showing left thalamic hemorrhage. Per report and MRI, 7 mm acute left thalamic hemorrhage.  No underlying ischemic infarct or mass lesion.  Echocardiogram with ejection fraction of 65% no wall motion abnormalities.  Troponin 0.06, INR 2.02.  Coumadin held due to bleed.  Carotid Dopplers with no ICA stenosis.  Cardiology follow-up for mildly elevated troponin nonspecific chest pain did not feel to be acute coronary syndrome and monitored.  In regards to anticoagulation neurology recommends hold on anticoagulation due to [place] repeat CT scan of the head in 2 weeks and if ICH resolved may resume anticoagulation antiplatelet therapy.  Tolerating a regular diet.  Physical and occupational therapy evaluations completed with recommendations of physical medicine rehab consult.  Patient was admitted for a comprehensive rehab program. Patient transferred to CIR on 01/23/2018 .   Patient currently requires min with mobility secondary to muscle weakness, decreased cardiorespiratoy endurance, decreased coordination, decreased awareness, decreased problem solving and decreased safety awareness and decreased standing balance, decreased postural control, hemiplegia and decreased balance strategies.  Prior  to hospitalization, patient was independent  with mobility and lived with Alone in a House home.  Home access is 5Stairs to enter.  Patient will benefit from skilled PT intervention to maximize safe functional mobility, minimize fall risk and decrease caregiver burden for planned discharge home with 24 hour supervision.  Anticipate patient will benefit from follow up [place] at discharge.  PT - End of Session Activity Tolerance: Tolerates 10 - 20 min activity with multiple rests Endurance Deficit: Yes Endurance Deficit Description: fatigues quickly, requires seated rest breaks PT Assessment Rehab Potential (ACUTE/IP ONLY): Good PT Barriers to Discharge: Decreased caregiver support;Home environment access/layout;Lack of/limited family support PT Patient demonstrates impairments in the following area(s): Balance;Endurance;Motor;Safety PT Transfers Functional Problem(s): Bed Mobility;Bed to Chair;Car;Furniture PT Locomotion Functional Problem(s): Ambulation;Stairs PT Plan PT Intensity: Minimum of 1-2 x/day ,45 to 90 minutes PT Frequency: 5 out of 7 days PT Duration Estimated Length of Stay: 7-10 days PT Treatment/Interventions: Ambulation/gait training;Discharge planning;Functional mobility training;Psychosocial support;Therapeutic Activities;Therapeutic Exercise;Skin care/wound management;Neuromuscular re-education;Disease management/prevention;Balance/vestibular training;Cognitive remediation/compensation;DME/adaptive equipment instruction;Pain management;Splinting/orthotics;UE/LE Strength taining/ROM;UE/LE Coordination activities;Stair training;Patient/family education;Community reintegration PT Transfers Anticipated Outcome(s): S overall PT Locomotion Anticipated Outcome(s): S overall with LRAD PT Recommendation Follow Up Recommendations: 24 hour supervision/assistance;Home health PT(vs SNF) Patient destination: Home Equipment Recommended:  To be determined Equipment Details: has RW  Skilled  Therapeutic Intervention Pt received in bed, denies pain and declining participation initially as pt is adamant about eating cereal before participation. With encouragement pt willing to demo "some" mobility prior to breakfast as cereal had not yet arrived from [employer], but required prolonged encouragement and education regarding purpose. Assessed mobility as below, minA overall. Discussed goals, estimated LOS to be determined once all evaluations completed however will likely recommend 24/7 S at d/c; pt reports daughter coming to stay with him. Daughter to come to hospital later and team alerted to ask family for more information. Remained in bed at end of session, alarm intact and all needs in reach.   PT Evaluation Precautions/Restrictions Precautions Precautions: Fall Restrictions Weight Bearing Restrictions: No General Chart Reviewed: Yes PT Missed Treatment Reason: Not applicable Response to Previous Treatment: Not applicable Family/Caregiver Present: No Vital SignsTherapy Vitals Temp: 97.9 F (36.6 C) Pulse Rate: 72 Resp: 18 BP: 120/76 Patient Position (if appropriate): Sitting Oxygen Therapy SpO2: 93 % O2 Device: Room Air Pain Pain Assessment Pain Scale: 0-10 Pain Score: 0-No pain Home Living/Prior Functioning Home Living Living Arrangements: Alone Available Help at Discharge: Family;Available PRN/intermittently Type of Home: House Home Access: Stairs to enter [employer] of Steps: 5 Entrance Stairs-Rails: Right;Left Home Layout: Two level;Able to live on main level with bedroom/bathroom  Lives With: Alone Prior Function Level of Independence: Independent with gait;Independent with homemaking with ambulation;Independent with basic ADLs  Able to Take Stairs?: Yes Driving: Yes Vocation: Retired Comments: Per daughter, pt recently shared with family regarding falls at home. Pt reports 2 falls in the past two months Vision/Perception   Perception Perception: Within Functional Limits Praxis Praxis: Intact  Cognition Overall Cognitive Status: History of cognitive impairments - at baseline Arousal/Alertness: Awake/alert Orientation Level: Oriented X4 Attention: Selective Selective Attention: Appears intact Selective Attention Impairment: Verbal basic Memory: Impaired Memory Impairment: Retrieval deficit Awareness: Impaired Awareness Impairment: Intellectual impairment Problem Solving: Impaired Problem Solving Impairment: Functional complex(question pt's effort level) Behaviors: Impulsive;Poor frustration tolerance Safety/Judgment: Impaired Sensation Sensation Light Touch: Appears Intact Coordination Gross Motor Movements are Fluid and Coordinated: No Heel Shin Test: slow, incoordinated Motor  Motor Motor: Hemiplegia Motor - Skilled Clinical Observations: mild R hemiparesis  Mobility Bed Mobility Bed Mobility: Sit to Supine;Supine to Sit Supine to Sit: Moderate Assistance - Patient 50-74% Sit to Supine: Supervision/Verbal cueing Transfers Transfers: Stand to [employer] Pivot Transfers Stand Pivot Transfers: Minimal Assistance - Patient > 75% Stand Pivot Transfer Details: Verbal cues for safe use of DME/AE;Verbal cues for sequencing Transfer (Assistive device): Rolling walker Locomotion  Gait Ambulation: Yes Gait Assistance: Minimal Assistance - Patient > 75%;Contact Guard/Touching assist Gait Distance (Feet): 150 Feet Assistive device: Rolling walker Gait Assistance Details: Verbal cues for technique;Verbal cues for gait pattern;Verbal cues for safe use of DME/AE Gait Gait: Yes Gait Pattern: Impaired Gait Pattern: Lateral trunk lean to right;Poor foot clearance - left;Poor foot clearance - right Gait velocity: decreased Stairs / Additional Locomotion Stairs: No Wheelchair Mobility Wheelchair Mobility: No  Trunk/Postural Assessment  Cervical Assessment Cervical Assessment: Exceptions to WFL(head  forward) Thoracic Assessment Thoracic Assessment: Exceptions to WFL(rounded shoulders) Lumbar Assessment Lumbar Assessment: Exceptions to WFL(posterior pelvic tilt) Postural Control Postural Control: Deficits on evaluation(delayed)  Balance Balance Balance Assessed: Yes Dynamic Sitting Balance Dynamic Sitting - Balance Support: No upper extremity supported Dynamic Sitting - Level of Assistance: 5: Stand by assistance Dynamic Sitting Balance - Compensations: on EOB, donning socks Dynamic Standing Balance Dynamic Standing -  Level of Assistance: 4: Min assist Dynamic Standing - Comments: transfers/gait Extremity Assessment  RUE Assessment Active Range of Motion (AROM) Comments: generlized weakness and decreased coordination; AROM WFL but pt report limited by arthritis PTA LUE Assessment Active Range of Motion (AROM) Comments: generalized weakness LUE<RUE RLE Assessment RLE Assessment: Exceptions to [facility] General Strength Comments: grossly 4/5 throughout LLE Assessment LLE Assessment: Exceptions to [facility] General Strength Comments: grossly 4/5 to 4+/5 throughout   See Function Navigator for Current Functional Status.   Refer to Care Plan for Long Term Goals  Recommendations for other services: None   Discharge Criteria: Patient will be discharged from PT if patient refuses treatment 3 consecutive times without medical reason, if treatment goals not met, if there is a change in medical status, if patient makes no progress towards goals or if patient is discharged from hospital.  The above assessment, treatment plan, treatment alternatives and goals were discussed and mutually agreed upon: by patient  Corliss Skains 01/24/2018, 4:31 PM

## 2018-01-24 NOTE — Evaluation (Signed)
Speech Language Pathology Assessment and Plan  Patient Details  Name: Christian Maxwell MRN: [id number] Date of Birth: 31-Oct-1932  SLP Diagnosis: Cognitive Impairments  Rehab Potential: Good ELOS: 7-10 days     Today's Date: 01/24/2018 SLP Individual Time: 1405-1500 SLP Individual Time Calculation (min): 55 min   Problem List:  Patient Active Problem List   Diagnosis Date Noted  . SOB (shortness of breath)   . Transaminitis   . Prediabetes   . Thalamic hemorrhage ([place]) 01/23/2018  . Cerebral hemorrhage ([place])   . Essential hypertension   . Dyslipidemia   . Dementia without behavioral disturbance 01/22/2018  . Ataxia 01/21/2018  . Bleeding in brain ([place]) 01/21/2018  . Hypoxia 01/21/2018  . Chest pain 01/20/2018  . Hearing impairment 11/11/2015  . Olecranon bursitis of left elbow 11/11/2015  . LBBB (left bundle branch block) 05/27/2015  . Senile purpura ([place]) 02/22/2015  . Wound, open, arm, forearm 02/22/2015  . Advanced care planning/counseling discussion 11/06/2014  . Medicare annual wellness visit, initial 11/06/2014  . Bilateral knee pain 03/17/2014  . DOE (dyspnea on exertion) 02/16/2014  . Encounter for therapeutic drug monitoring 06/17/2013  . Memory impairment 03/06/2013  . MRSA colonization 12/12/2012  . Nevus of sternal region 12/04/2012  . Seasonal allergic rhinitis 10/22/2012  . Thyroid disease   . Suprapubic abscess 08/21/2012  . H/O colon cancer, stage I 06/24/2012  . Adenomatous colon polyp 06/24/2012  . Open wound-left arm and suprapubic area. 03/11/2012  . Chest discomfort 04/21/2011  . Hyperlipidemia 10/14/2010  . Atrial fibrillation ([place]) 08/08/2010  . INSOMNIA, MILD 12/12/2006   Past Medical History:  Past Medical History:  Diagnosis Date  . Abscess 7/13   abd abscess  . Cancer [facility])    colon cancer  . Chronic atrial fibrillation ([place])   . History of colon cancer 2000   T3N0 s/p colectomy  . History of echocardiogram    a. Echo 2/13:   Mild LVH, EF 55-60%, mild MR, moderate LAE, mild to moderate RAE;  b. Echo 1/17: mild LVH, vigorous LVF, EF 65-70%, no RWMA, trivial AI, trivial MR, severe LAE, mild RAE, mod TR, PASP 32 mmHg  . History of stress test    a. Cardiolite in 9/04: EF 67%, no ischemia.;  b. Myoview 6/16: no ischemia   . Hx of adenomatous colonic polyps   . Hyperlipidemia   . Hypertension   . METHICILLIN RESISTANT STAPHYLOCOCCUS AUREUS INFECTION 12/12/2006   Annotation: stitch abcess in lower abdomen after colon cancer resection Qualifier: Diagnosis of  By: [name] MD, [name]    . Myocardial infarction ([place]) 1985  . Postoperative stitch abscess 07/19/2011  . PVC's (premature ventricular contractions)   . Syncope and collapse    pt denies  . Thyroid disease    followed by [name]   Past Surgical History:  Past Surgical History:  Procedure Laterality Date  . COLECTOMY  2000   6 inches removed  . COLONOSCOPY  2009   polyp, h/o cancer [name])  . EYE SURGERY     cataract surgery both eyes- pts ates he has never had cataract surgery 10-26-15  . HERNIA REPAIR  10/2692   DB umbilical hernia  . INCISE AND DRAIN ABCESS  2009,2010,2011,2012  . INCISION AND DRAINAGE Right 04/02/2013   Procedure: INCISION AND DRAINAGE RIGHT KNEE HEMATOMA;  Surgeon: [name], MD;  Location: [place];  Service: Orthopedics;  Laterality: Right;  . INCISION AND DRAINAGE ABSCESS N/A 09/20/2012   Procedure: INCISION AND  DRAINAGE SUPRAPUBIC ABSCESS;  Surgeon: [name], MD;  Location: [place];  Service: General;  Laterality: N/A;  . INCISION AND DRAINAGE ABSCESS Left 02/28/2013   Procedure: INCISION AND DRAINAGE LEFT ARM ABSCESS;  Surgeon: [name], MD;  Location: [place];  Service: General;  Laterality: Left;  . LUMBAR LAMINECTOMY  1977  . MASS EXCISION  03/07/2012   EXCISION MASS;  Surgeon: [name], MD;  Laterality: Right;  removal mass posterior right arm  . POLYPECTOMY    . WOUND  EXPLORATION  2006   For possible stitch abscess Dr [name]    Assessment / Plan / Recommendation Clinical Impression   Christian Maxwell is an 84 year old right-handed male with history of dementia maintained on Aricept.  Chronic atrial fibrillation maintained on Coumadin, colon cancer, hypertension.  Per chart review, patient, and family, lives alone reported to be independent driving short distances.  2 level home bedroom upstairs.  Family in the vicinity question assistance on discharge.  There is been reports of 2 falls in the past couple of months.  Presented 01/21/2018 with nonspecific chest pain, dizziness and generalized weakness.  CT reviewed, showing left thalamic hemorrhage. Per report and MRI, 7 mm acute left thalamic hemorrhage.  No underlying ischemic infarct or mass lesion.  Echocardiogram with ejection fraction of 65% no wall motion abnormalities.  Troponin 0.06, INR 2.02.  Coumadin held due to bleed.  Carotid Dopplers with no ICA stenosis.  Cardiology follow-up for mildly elevated troponin nonspecific chest pain did not feel to be acute coronary syndrome and monitored.  In regards to anticoagulation neurology recommends hold on anticoagulation due to [place] repeat CT scan of the head in 2 weeks and if ICH resolved may resume anticoagulation antiplatelet therapy.  Tolerating a regular diet.  Physical and occupational therapy evaluations completed with recommendations of physical medicine rehab consult.  Patient was admitted for a comprehensive rehab program.   SLP evaluation was completed on 01/24/2018 with the following results:  Pt presents with mild cognitive deficits superimposed over underlying baseline dementia.  Pt scored 22/30 on the MoCA (n >/=26) with impairment most evident in executive functioning and delayed recall subtests.  Pt reports he functioned at baseline by adhering to a very consistent, simple, and structured schedule daily; however, pt's daughter's now question whether pt was  actually safe to be living alone as they are discovering evidence of recurrent falls at home in addition to new, unexplained scratches to pt's car.  It is difficult to determine to what extent these deficits seen on today's evaluation are secondary to pt's acute CVA versus sequela of worsening dementia.   Regardless, pt would benefit from skilled ST while inpatient in order to maximize functional independence and reduce burden of care prior to discharge.  Anticipate that pt will need 24/7 supervision at discharge, but question if he could progress to the point where ALF may eventually be appropriate for long term care.     Skilled Therapeutic Interventions          Cognitive-linguistic evaluation completed with results and recommendations reviewed with patient and family. As a result of the deficits mentioned above, pt needed min assist to complete evalulation tasks.  Discussed anticipated supervision needs with pt's daughters and interpreted results of cognitive assessment as they pertain to pt's capacity for living independently.  Discussed the role of ST interventions for maximizing pt's functional independence and reducing burden of care prior to discharge.  All questions were answered to pt's  and daughters' satisfaction at this time.      SLP Assessment  Patient will need skilled [place] [facility]ology Services during CIR admission    Recommendations  Patient destination: Home Follow up Recommendations: Home Health SLP;24 hour supervision/assistance Equipment Recommended: None recommended by SLP    SLP Frequency 3 to 5 out of 7 days   SLP Duration  SLP Intensity  SLP Treatment/Interventions 7-10 days   Minumum of 1-2 x/day, 30 to 90 minutes  Cognitive remediation/compensation;Cueing hierarchy;Patient/family education;Internal/external aids;Environmental controls;Functional tasks    Pain Pain Assessment Pain Scale: 0-10 Pain Score: 0-No pain Pain Type: Chronic pain Pain  Location: Chest Pain Orientation: Anterior Pain Descriptors / Indicators: Discomfort Pain Frequency: Constant Pain Onset: Other (Comment)(deep breathe or when trying to cough) Pain Intervention(s): Medication (See eMAR)  Prior Functioning Cognitive/Linguistic Baseline: Baseline deficits Baseline deficit details: history of dementia, was very structured in his daily routine Type of Home: House  Lives With: Alone Available Help at Discharge: Family;Available PRN/intermittently Education: reports driving short distances Vocation: Retired  Function:  Eating Eating               Cognition Comprehension Comprehension assist level: Follows basic conversation/direction with extra time/assistive device  Expression   Expression assist level: Expresses basic needs/ideas: With extra time/assistive device  Social Interaction Social Interaction assist level: Interacts appropriately 90% of the time - Needs monitoring or encouragement for participation or interaction.  Problem Solving Problem solving assist level: Solves basic 75 - 89% of the time/requires cueing 10 - 24% of the time  Memory Memory assist level: Recognizes or recalls 75 - 89% of the time/requires cueing 10 - 24% of the time   Short Term Goals: Week 1: SLP Short Term Goal 1 (Week 1): STG=LTG due to ELOS   Refer to Care Plan for Long Term Goals  Recommendations for other services: None   Discharge Criteria: Patient will be discharged from SLP if patient refuses treatment 3 consecutive times without medical reason, if treatment goals not met, if there is a change in medical status, if patient makes no progress towards goals or if patient is discharged from hospital.  The above assessment, treatment plan, treatment alternatives and goals were discussed and mutually agreed upon: by patient and by family  Emilio Math 01/24/2018, 8:19 PM

## 2018-01-25 ENCOUNTER — Inpatient Hospital Stay (HOSPITAL_COMMUNITY): Payer: Medicare Other

## 2018-01-25 ENCOUNTER — Inpatient Hospital Stay (HOSPITAL_COMMUNITY): Payer: Medicare Other | Admitting: Physical Therapy

## 2018-01-25 ENCOUNTER — Inpatient Hospital Stay (HOSPITAL_COMMUNITY): Payer: Medicare Other | Admitting: Speech Pathology

## 2018-01-25 DIAGNOSIS — G479 Sleep disorder, unspecified: Secondary | ICD-10-CM

## 2018-01-25 NOTE — Progress Notes (Signed)
Occupational Therapy Session Note  Patient Details  Name: Christian Maxwell MRN: [id number] Date of Birth: [birth year]  Today's Date: 01/25/2018 OT Individual Time: 4944-9675 OT Individual Time Calculation (min): 75 min    Short Term Goals: Week 1:  OT Short Term Goal 1 (Week 1): n/a d/t ELOS  Skilled Therapeutic Interventions/Progress Updates:    1:1. Upon entering room pt with no c/o pain reporting need to toilet. Pt ambulates to bathroom with CGA to stand over toilet and void urine. Pt transfers back to EOB as stated above to complete eating breakfast with supervision and A to open milk container. Pt unwilling to participate in tx until "I have my cereal and grape juice." Pt often asking OT to open packages and containers and OT asks question cues to problem solve other ways to open packages and containers. Pt unable to, but with persistence opens containers in original way. OT applies occlusive to IV site and pt transfers to shower to stand to bathe despite VC for sitting for safety awareness and energy conservation. Pt showers standing with intermittent CGA for standing balance and posterior sway. Pt steadying self on grab bar. Pt dresses EOB with set up to bring bag of clothing over to pt. Pt gathers all needed clothing with min question cues to notice he forgot underwear. Pt able to don underwear, pants, and shirt with GCA for LB dressing when advancing pants past hips and supervision for shirt today. Pt requesting to attempt to void bowel. Pt completes transfer to toilet as stated above with RW and requires increased time to void bowel. Pt CGA for clothing management and S for lateral lean to complete hygiene. Pt stands at sink for hand washing. PT returns to toilet as feels like another BM coming on. OT escorts to bathroom and NT finishes supervision of toileting while pt seated on commode. Therapy Documentation Precautions:  Precautions Precautions: Fall Restrictions Weight Bearing  Restrictions: No  See Function Navigator for Current Functional Status.   Therapy/Group: Individual Therapy  Tonny Branch 01/25/2018, 7:19 AM

## 2018-01-25 NOTE — Progress Notes (Signed)
Speech Language Pathology Daily Session Note  Patient Details  Name: Christian Maxwell MRN: [id number] Date of Birth: Mar 25, 1933  Today's Date: 01/25/2018 SLP Individual Time: 1105-1200 SLP Individual Time Calculation (min): 55 min  Short Term Goals: Week 1: SLP Short Term Goal 1 (Week 1): STG=LTG due to ELOS   Skilled Therapeutic Interventions:  Pt was seen for skilled ST targeting cognitive goals.  Pt had already had both PT and OT therapy sessions this morning and declined getting out of bed for speech therapy due to fatigue.  Additionally, pt was resistant to even sitting up in bed and needed max encouragement to allow therapist to elevate head of bed >30 degrees.  SLP facilitated the session with medication management tasks.  Pt was ~75% accurate for recall of function and frequency of medications when named, errors were consistently with newly scheduled medications versus those known to him from previous admission.  Pt needed min cues for error awareness and task organization when loading pills into a twice daily pill box.  Discussed that pt will need long term assistance for management of medications given history of dementia and likely progression of disease.  Daughter and pt both in agreement.  Pt was left in bed with bed alarm set and call bell within reach.  Continue per current plan of care.    Function:  Eating Eating   Modified Consistency Diet: No Eating Assist Level: Set up assist for;More than reasonable amount of time           Cognition Comprehension Comprehension assist level: Follows basic conversation/direction with extra time/assistive device  Expression   Expression assist level: Expresses basic needs/ideas: With extra time/assistive device  Social Interaction Social Interaction assist level: Interacts appropriately 75 - 89% of the time - Needs redirection for appropriate language or to initiate interaction.  Problem Solving Problem solving assist level: Solves  basic 50 - 74% of the time/requires cueing 25 - 49% of the time  Memory Memory assist level: Recognizes or recalls 50 - 74% of the time/requires cueing 25 - 49% of the time    Pain Pain Assessment Pain Scale: 0-10 Pain Score: 0-No pain  Therapy/Group: Individual Therapy  Christian Maxwell, Christian Maxwell 01/25/2018, 12:47 PM

## 2018-01-25 NOTE — Progress Notes (Signed)
[facility] PHYSICAL MEDICINE & REHABILITATION     PROGRESS NOTE  Subjective/Complaints:  Patient working with therapies this morning. He states he slept well overnight. He states he wants to go home. He also states he would like to increase his Ambien to 10 mg.  ROS: Denies CP, nausea, vomiting diarrhea.  Objective: Vital Signs: Blood pressure 129/82, pulse (!) 55, temperature 97.6 F (36.4 C), temperature source Oral, resp. rate 17, height 5\' 9"  (1.753 m), weight 77.2 kg, SpO2 96 %. Dg Chest 2 View  Result Date: 01/23/2018 CLINICAL DATA:  Dyspnea EXAM: CHEST - 2 VIEW COMPARISON:  CT chest 01/21/2016.  Chest x-ray 01/20/2018. FINDINGS: Low lung volumes. Interstitial markings are diffusely coarsened with chronic features. The cardio pericardial silhouette is enlarged. Basilar opacity evident on lateral film compatible with atelectasis noted on yesterday's CT. Old left clavicle fracture evident. Telemetry leads overlie the chest. IMPRESSION: Posterior basilar opacity likely related to atelectasis as noted on yesterday's CT scan. Electronically Signed   By: Misty Stanley M.D.   On: 01/23/2018 10:32   Recent Labs    01/24/18 0543  WBC 10.1  HGB 16.9  HCT 53.2*  PLT 222   Recent Labs    01/23/18 0949 01/24/18 0543  NA 137 138  K 3.7 3.5  CL 98 101  GLUCOSE 166* 114*  BUN 26* 28*  CREATININE 0.95 0.88  CALCIUM 10.0 10.0   CBG (last 3)  Recent Labs    01/22/18 1027  GLUCAP 166*    Wt Readings from Last 3 Encounters:  01/23/18 77.2 kg  01/23/18 79.7 kg  10/03/16 84.4 kg    Physical Exam:  BP 129/82 (BP Location: Right Arm)   Pulse (!) 55   Temp 97.6 F (36.4 C) (Oral)   Resp 17   Ht 5\' 9"  (1.753 m)   Wt 77.2 kg   SpO2 96%   BMI 25.13 kg/m  Constitutional: He appears well-developed and well-nourished.  HENT: Normocephalic and atraumatic.  Eyes: EOM are normal. No discharge.  Cardiovascular: irregularly irregular. No JVD.  Respiratory: Effort normal and breath  sounds normal. + [place]. GI: Bowel sounds are normal. He exhibits no distension.  Musculoskeletal: No edema or tenderness in extremities  Neurological: He is alert.  Follows simple commands.   Motor: Right upper extremity/right lower extremity 4-4+/5 proximal distal Left upper extremity: 4-/5 (some pain inhibition)  Left lower extremity: 4/5 proximal to distal  Skin: Skin is warm and dry.  Psychiatric: His affect is flat.  Assessment/Plan: 1. Functional deficits secondary to left thalamic hemorrhage which require 3+ hours per day of interdisciplinary therapy in a comprehensive inpatient rehab setting. Physiatrist is providing close team supervision and 24 hour management of active medical problems listed below. Physiatrist and rehab team continue to assess barriers to discharge/monitor patient progress toward functional and medical goals.  Function:  Bathing Bathing position   Position: Sitting EOB  Bathing parts Body parts bathed by patient: Right arm, Left arm, Chest, Abdomen, Front perineal area, Buttocks, Right upper leg, Left upper leg, Right lower leg, Left lower leg    Bathing assist Assist Level: Touching or steadying assistance(Pt > 75%)      Upper Body Dressing/Undressing Upper body dressing   What is the patient wearing?: Pull over shirt/dress     Pull over shirt/dress - Perfomed by patient: Thread/unthread right sleeve, Thread/unthread left sleeve, Pull shirt over trunk Pull over shirt/dress - Perfomed by helper: Put head through opening  Upper body assist Assist Level: Touching or steadying assistance(Pt > 75%)      Lower Body Dressing/Undressing Lower body dressing   What is the patient wearing?: Pants, Non-skid slipper socks     Pants- Performed by patient: Thread/unthread right pants leg, Thread/unthread left pants leg, Pull pants up/down     Non-skid slipper socks- Performed by helper: Don/doff right sock, Don/doff left sock                   Lower body assist Assist for lower body dressing: Touching or steadying assistance (Pt > 75%)      Toileting Toileting Toileting activity did not occur: (wearing gown only) Toileting steps completed by patient: Performs perineal hygiene      Toileting assist Assist level: Touching or steadying assistance (Pt.75%)   Transfers Chair/bed transfer   Chair/bed transfer method: Ambulatory Chair/bed transfer assist level: Touching or steadying assistance (Pt > 75%) Chair/bed transfer assistive device: Armrests, Medical sales representative     Max distance: 150 Assist level: Touching or steadying assistance (Pt > 75%)   Wheelchair Wheelchair activity did not occur: N/A        Cognition Comprehension Comprehension assist level: Follows basic conversation/direction with extra time/assistive device  Expression Expression assist level: Expresses basic needs/ideas: With extra time/assistive device  Social Interaction Social Interaction assist level: Interacts appropriately 90% of the time - Needs monitoring or encouragement for participation or interaction.  Problem Solving Problem solving assist level: Solves basic 75 - 89% of the time/requires cueing 10 - 24% of the time  Memory Memory assist level: Recognizes or recalls 75 - 89% of the time/requires cueing 10 - 24% of the time    Medical Problem List and Plan: 1.  Gait disorder with history of dementia secondary to left thalamic hemorrhage secondary to small vessel disease versus Coumadin associated coagulopathy source  Continue CIR 2.  DVT Prophylaxis/Anticoagulation: SCDs.  Monitor for any bleeding episodes 3. Pain Management: Tylenol as needed 4. Mood: Aricept 10 mg daily, Xanax 0.25 mg twice daily as needed 5. Neuropsych: This patient is ?not fully capable of making decisions on his own behalf. 6. Skin/Wound Care: Routine skin checks 7. Fluids/Electrolytes/Nutrition: Routine in and outs   BMP within acceptable range on  9/12 8.  Chronic atrial fibrillation.COUMADIN ON HOLD due to thalamic hemorrhage.PLAN REPEAT CT HEAD IN  2 WEEKS (~9/25) and resume anticoagulation and antiplatelet if ICH resolves. 9.  Hypertension.  Lopressor 25 mg twice daily  Relatively controlled on 9/13 10.  Hyperlipidemia.  Lipitor 11. Prediabetes  Monitor with increased mobility 12. Transaminitis  AST elevated on 9/12  Labs ordered for Monday  Cont to monitor 13. SOB  DG reviewed, suggesting atelectasis  Wean supplemental O2 as tolerated 14. Sleep disturbance  Continue meds   LOS (Days) 2 A FACE TO FACE EVALUATION WAS PERFORMED  Dayra Rapley Lorie Phenix 01/25/2018 9:08 AM

## 2018-01-25 NOTE — Progress Notes (Signed)
Social Work Patient ID: Christian Maxwell, male   DOB: [birth year], 84 y.o.   MRN: [id number]  Met with daughter-Natalie to discuss the care pt will require at discharge. She doesn't want to not have time to get it in place. Informed her pt will need 24 hr supervision for safety, medication management and care. She is looking into two insurance policies pt has to see if has coverage for private duty aides. Dan-PA addressed he will not be able to drive when he leaves here and should not have been prior to admission. Will see Monday to discuss what found out regarding policies and to begin making arrangements for discharge.

## 2018-01-25 NOTE — Progress Notes (Signed)
Physical Therapy Session Note  Patient Details  Name: Christian Maxwell MRN: [id number] Date of Birth: June 12, 1932  Today's Date: 01/25/2018 PT Individual Time: 1000-1100 PT Individual Time Calculation (min): 60 min   Short Term Goals: Week 1:  PT Short Term Goal 1 (Week 1): =LTG due to estimated LOS  Skilled Therapeutic Interventions/Progress Updates:    Pt received supine in bed, agreeable to PT. No complaints of pain. Bed mobility Supervision. Sit to stand with min A to RW throughout therapy session. Ambulation x 200 ft with RW and CGA, some ataxia and path deviation with gait. Ambulation x 100 ft with no AD and min A, increased assist needed with turns due to some scissoring of gait. Car transfer with min A and RW, v/c for safe transfer technique. Ascend/descend 4 stairs with 2 handrails and min A, step-through ascending and step-to descending. Berg Balance Test 21/56, high fall risk. Reviewed score with patient and patient's daughter and discussed fall risk. Side-steps 2 x 30 ft L/R with min A for balance, tandem amb x 30 ft with rail in hallway and min A for balance. Pt requires cues throughout therapy session for safety with gait, transfers, avoiding obstacles, etc. Educated pt on importance of rest breaks throughout therapy session as he has increased SOA with standing activities but refuses rest breaks. Pt's daughter present during therapy session and educated her with regards to safety in the home (removing throw rugs, adding in 2nd rail for stairs, etc.). Pt left supine in bed with needs in reach and bed alarm in place at end of therapy session, daughter present.  Therapy Documentation Precautions:  Precautions Precautions: Fall Restrictions Weight Bearing Restrictions: No    Balance: Balance Balance Assessed: Yes Standardized Balance Assessment Standardized Balance Assessment: Berg Balance Test Berg Balance Test Sit to Stand: Needs minimal aid to stand or to stabilize Standing  Unsupported: Able to stand 2 minutes with supervision Sitting with Back Unsupported but Feet Supported on Floor or Stool: Able to sit safely and securely 2 minutes Stand to Sit: Uses backs of legs against chair to control descent Transfers: Needs one person to assist Standing Unsupported with Eyes Closed: Able to stand 10 seconds with supervision Standing Ubsupported with Feet Together: Able to place feet together independently and stand for 1 minute with supervision From Standing, Reach Forward with Outstretched Arm: Loses balance while trying/requires external support From Standing Position, Pick up Object from Floor: Unable to try/needs assist to keep balance From Standing Position, Turn to Look Behind Over each Shoulder: Needs supervision when turning Turn 360 Degrees: Needs close supervision or verbal cueing Standing Unsupported, Alternately Place Feet on Step/Stool: Needs assistance to keep from falling or unable to try Standing Unsupported, One Foot in Front: Able to take small step independently and hold 30 seconds Standing on One Leg: Unable to try or needs assist to prevent fall Total Score: 21  See Function Navigator for Current Functional Status.   Therapy/Group: Individual Therapy  Excell Seltzer, PT, DPT  01/25/2018, 12:09 PM

## 2018-01-26 ENCOUNTER — Inpatient Hospital Stay (HOSPITAL_COMMUNITY): Payer: Medicare Other

## 2018-01-26 ENCOUNTER — Inpatient Hospital Stay (HOSPITAL_COMMUNITY): Payer: Medicare Other | Admitting: Physical Therapy

## 2018-01-26 ENCOUNTER — Inpatient Hospital Stay (HOSPITAL_COMMUNITY): Payer: Medicare Other | Admitting: Speech Pathology

## 2018-01-26 MED ORDER — NON FORMULARY: 1.5000 mg | Freq: Every day | Status: DC

## 2018-01-26 MED ORDER — MELATONIN 3 MG PO TABS
1.5000 mg | ORAL_TABLET | Freq: Every day | ORAL | Status: DC
  Administered 2018-01-26 – 2018-01-30 (×5): 1.5 mg via ORAL
  Filled 2018-01-26 (×5): qty 0.5

## 2018-01-26 NOTE — Plan of Care (Signed)
  Problem: Consults Goal: RH STROKE PATIENT EDUCATION Description See Patient Education module for education specifics  Outcome: Progressing   Problem: RH BLADDER ELIMINATION Goal: RH STG MANAGE BLADDER WITH ASSISTANCE Description STG Manage Bladder With mod I Assistance  Outcome: Progressing   Problem: RH SAFETY Goal: RH STG ADHERE TO SAFETY PRECAUTIONS W/ASSISTANCE/DEVICE Description STG Adhere to Safety Precautions With min Assistance/Device.  Outcome: Progressing   Problem: RH PAIN MANAGEMENT Goal: RH STG PAIN MANAGED AT OR BELOW PT'S PAIN GOAL Description Less than 3 out of 10  Outcome: Progressing

## 2018-01-26 NOTE — Progress Notes (Signed)
Speech Language Pathology Daily Session Note  Patient Details  Name: Christian Maxwell MRN: [id number] Date of Birth: 07-16-1932  Today's Date: 01/26/2018 SLP Individual Time: 1300-1330 SLP Individual Time Calculation (min): 30 min  Short Term Goals: Week 1: SLP Short Term Goal 1 (Week 1): STG=LTG due to ELOS   Skilled Therapeutic Interventions:  Pt was seen for skilled ST targeting cognitive goals. Pt was received in bed upon therapist's arrival.  He was waiting on his daughter's to bring him his usual lunch and was reluctant to leave his room but was eventually encouraged to "go for a walk" with therapist.  While ambulating pt's brief fell to the floor around his ankle without his awareness until therapist pointed it out.  After walking to the day room, therapist told pt to sit down in a chair at a table in the hopes of engaging him in a cognitive task.   As therapist stepped away to get him a drink of water, pt impulsively stood up and began walking to another table so he could sit in a different chair.  Therapist attempted to engage pt in a newspaper reading task that was personally meaningful to pt; however, he refused to engage other than to look up the score of the [place] baseball game last night, which he could do with mod I.   SLP attempted to explain rationale of ST interventions in maximizing his functional independence and reducing burden of care prior to discharge as well as his daughter's concerns of acute cognitive changes post CVA; however, pt simply responded, "There's nothing wrong with my mind" and suggested that the therapist "go check on somebody else."  Pt was not motivated to participate by the idea of regaining independence in the home environment, as he perseverated on telling therapist that he has a home health insurance policy that will cover 24/7 supervision from paid caregivers.  Pt needed supervision cues for route finding back to his room and was left in bed with bed  alarm set and call bell within reach.  Session was ended early due to pt's refusal to further participate meaningfully in therapy.  Question whether ST interventions remain appropriate given pt's resistance to any method of intervention attempted thus far.    Function:  Eating Eating                 Cognition Comprehension Comprehension assist level: Follows basic conversation/direction with extra time/assistive device  Expression   Expression assist level: Expresses basic needs/ideas: With extra time/assistive device  Social Interaction Social Interaction assist level: Interacts appropriately 90% of the time - Needs monitoring or encouragement for participation or interaction.  Problem Solving Problem solving assist level: Solves basic 75 - 89% of the time/requires cueing 10 - 24% of the time  Memory Memory assist level: Recognizes or recalls 50 - 74% of the time/requires cueing 25 - 49% of the time    Pain Pain Assessment Pain Scale: 0-10 Pain Score: 0-No pain  Therapy/Group: Individual Therapy  Shawnise Peterkin, Selinda Orion 01/26/2018, 1:36 PM

## 2018-01-26 NOTE — Progress Notes (Signed)
Occupational Therapy Session Note  Patient Details  Name: Christian Maxwell MRN: [id number] Date of Birth: [birth year]  Today's Date: 01/26/2018 OT Individual Time: 1530-1650 OT Individual Time Calculation (min): 80 min    Short Term Goals: Week 1:  OT Short Term Goal 1 (Week 1): n/a d/t ELOS  Skilled Therapeutic Interventions/Progress Updates:    1:1. Pt brushes teeth standing at sink with supervision.Pt toilets with CGA-supervision level 2x throughout session with VC for RW management. Heavy education with pt and daughter on RW management, turns and reaching back prior to sitting as pt tends to lift RW to turn, bump into objects with RW and plop into seat. Pt completes standing on compliant surface and foam wedge with CGA-mod A for posterior lean d/t decreased ankle strategy and weight shifting while reaching for horse shoes. Pt completes biodex weight shifting activities with OT initailly guiding hips towards targets to facilitate weight shifting fading to pt able to weight shift with VC for directions. Pt/daughter educated on RW posterior method to enter walk in shower. Pt able to complete with supervision and VC for not binging RW in with him into shower. Exited session with pt seated in bed, call light in reach and exit alarm on.  Therapy Documentation Precautions:  Precautions Precautions: Fall Restrictions Weight Bearing Restrictions: No General:   Vital Signs: Therapy Vitals Temp: 97.7 F (36.5 C) Temp Source: Oral Pulse Rate: 70 Resp: 18 BP: 123/72 Patient Position (if appropriate): Lying Oxygen Therapy SpO2: 97 % Pain: Pain Assessment Pain Scale: 0-10 Pain Score: 0-No pain  See Function Navigator for Current Functional Status.   Therapy/Group: Individual Therapy  Tonny Branch 01/26/2018, 4:55 PM

## 2018-01-26 NOTE — Progress Notes (Signed)
[facility] PHYSICAL MEDICINE & REHABILITATION     PROGRESS NOTE  Subjective/Complaints:  Patient seen lying in bed this morning. He states he did not sleep well overnight because he is in the hospital. He states he wants to go home.  ROS: Denies CP, nausea, vomiting diarrhea.  Objective: Vital Signs: Blood pressure 132/83, pulse 62, temperature 97.6 F (36.4 C), temperature source Oral, resp. rate 16, height 5\' 9"  (1.753 m), weight 77.2 kg, SpO2 96 %. No results found. Recent Labs    01/24/18 0543  WBC 10.1  HGB 16.9  HCT 53.2*  PLT 222   Recent Labs    01/24/18 0543  NA 138  K 3.5  CL 101  GLUCOSE 114*  BUN 28*  CREATININE 0.88  CALCIUM 10.0   CBG (last 3)  No results for input(s): GLUCAP in the last 72 hours.  Wt Readings from Last 3 Encounters:  01/23/18 77.2 kg  01/23/18 79.7 kg  10/03/16 84.4 kg    Physical Exam:  BP 132/83 (BP Location: Right Arm)   Pulse 62   Temp 97.6 F (36.4 C) (Oral)   Resp 16   Ht 5\' 9"  (1.753 m)   Wt 77.2 kg   SpO2 96%   BMI 25.13 kg/m  Constitutional: He appears well-developed and well-nourished.  HENT: Normocephalic and atraumatic.  Eyes: EOM are normal. No discharge.  Cardiovascular: irregularly irregular. No JVD.  Respiratory: Effort normal and breath sounds normal.  GI: Bowel sounds are normal. He exhibits no distension.  Musculoskeletal: No edema or tenderness in extremities  Neurological: He is alert.  Follows simple commands.   Motor: Right upper extremity/right lower extremity 4-4+/5 proximal distal Left upper extremity: 4-/5 proximal to distal, stable  Left lower extremity: 4/5 proximal to distal, stable Skin: Skin is warm and dry.  Psychiatric: His affect is flat.  Assessment/Plan: 1. Functional deficits secondary to left thalamic hemorrhage which require 3+ hours per day of interdisciplinary therapy in a comprehensive inpatient rehab setting. Physiatrist is providing close team supervision and 24 hour  management of active medical problems listed below. Physiatrist and rehab team continue to assess barriers to discharge/monitor patient progress toward functional and medical goals.  Function:  Bathing Bathing position   Position: Sitting EOB  Bathing parts Body parts bathed by patient: Right arm, Left arm, Chest, Abdomen, Front perineal area, Buttocks, Right upper leg, Left upper leg, Right lower leg, Left lower leg    Bathing assist Assist Level: Touching or steadying assistance(Pt > 75%)      Upper Body Dressing/Undressing Upper body dressing   What is the patient wearing?: Pull over shirt/dress     Pull over shirt/dress - Perfomed by patient: Thread/unthread right sleeve, Thread/unthread left sleeve, Pull shirt over trunk Pull over shirt/dress - Perfomed by helper: Put head through opening        Upper body assist Assist Level: Touching or steadying assistance(Pt > 75%)      Lower Body Dressing/Undressing Lower body dressing   What is the patient wearing?: Pants, Non-skid slipper socks     Pants- Performed by patient: Thread/unthread right pants leg, Thread/unthread left pants leg, Pull pants up/down     Non-skid slipper socks- Performed by helper: Don/doff right sock, Don/doff left sock                  Lower body assist Assist for lower body dressing: Touching or steadying assistance (Pt > 75%)      Toileting Toileting Toileting activity did  not occur: (wearing gown only) Toileting steps completed by patient: Performs perineal hygiene      Toileting assist Assist level: Touching or steadying assistance (Pt.75%)   Transfers Chair/bed transfer   Chair/bed transfer method: Ambulatory Chair/bed transfer assist level: Touching or steadying assistance (Pt > 75%) Chair/bed transfer assistive device: Armrests, Medical sales representative     Max distance: 200' Assist level: Touching or steadying assistance (Pt > 75%)   Wheelchair Wheelchair  activity did not occur: N/A        Cognition Comprehension Comprehension assist level: Follows basic conversation/direction with extra time/assistive device  Expression Expression assist level: Expresses basic needs/ideas: With extra time/assistive device  Social Interaction Social Interaction assist level: Interacts appropriately 75 - 89% of the time - Needs redirection for appropriate language or to initiate interaction.  Problem Solving Problem solving assist level: Solves basic 50 - 74% of the time/requires cueing 25 - 49% of the time  Memory Memory assist level: Recognizes or recalls 50 - 74% of the time/requires cueing 25 - 49% of the time    Medical Problem List and Plan: 1.  Gait disorder with history of dementia secondary to left thalamic hemorrhage secondary to small vessel disease versus Coumadin associated coagulopathy source  Continue CIR 2.  DVT Prophylaxis/Anticoagulation: SCDs.  Monitor for any bleeding episodes 3. Pain Management: Tylenol as needed 4. Mood: Aricept 10 mg daily, Xanax 0.25 mg twice daily as needed 5. Neuropsych: This patient is ?not fully capable of making decisions on his own behalf. 6. Skin/Wound Care: Routine skin checks 7. Fluids/Electrolytes/Nutrition: Routine in and outs   BMP within acceptable range on 9/12 8.  Chronic atrial fibrillation.COUMADIN ON HOLD due to thalamic hemorrhage.PLAN REPEAT CT HEAD IN  2 WEEKS (~9/25) and resume anticoagulation and antiplatelet if ICH resolves. 9.  Hypertension.  Lopressor 25 mg twice daily  Relatively controlled on 9/14 10.  Hyperlipidemia.  Lipitor 11. Prediabetes  Monitor with increased mobility 12. Transaminitis  AST elevated on 9/12  Labs ordered for Monday  Cont to monitor 13. SOB: Improving  DG reviewed, suggesting atelectasis  ?Weaned supplemental O2  14. Sleep disturbance  Continue meds   Melatonin started on 9/14  LOS (Days) 3 A FACE TO FACE EVALUATION WAS PERFORMED  Christian Maxwell Lorie Phenix 01/26/2018 1:17 PM

## 2018-01-26 NOTE — Progress Notes (Signed)
Physical Therapy Session Note  Patient Details  Name: Christian Maxwell MRN: [id number] Date of Birth: [birth year]  Today's Date: 01/26/2018 PT Individual Time: 9536-9223 PT Individual Time Calculation (min): 71 min   Short Term Goals: Week 1:  PT Short Term Goal 1 (Week 1): =LTG due to estimated LOS  Skilled Therapeutic Interventions/Progress Updates: Pt presented in bed agreeable to therapy. Performed supine to sit with minA and verbal cues with HOB elevated and use of bed rail. Pt ambulated to rehab gym with RW with CGA and x 1 vc due to increased R drift and noted to be distracted. Participated in dynamic standing balance activities including toe taps to target without AD and horseshoes with feet apart and with L/R foot 1 step forward.  Pt also participated in static standing activties on Airex including feet together/apart, head turns, eyes open/closed. Pt was able to maintain balance and self correct when gentle permutations applied. Participated in gait activities including obstacle course weaving through cones and stepping over threshold with and without AD. Pt also ambulated approx 111f without AD initially CGA however requiring minA with fatigue and noted decreased R foot clearance and decreased attention to objects on R side. Pt participated in NuStep L2 x 7 min for endurance and global strengthening. After brief rest pt ambulated back to room with RW CGA with PTA advising pt on pacing due to increased distance as pt did not want to take sitting/standing rest break. Pt returned to bed once in room and performed sit to supine with bed flat and without bed rail. Pt remained in bed at end of session with call bell within reach, bed alarm on, and needs met.      Therapy Documentation Precautions:  Precautions Precautions: Fall Restrictions Weight Bearing Restrictions: No General:   Vital Signs: Therapy Vitals Pulse Rate: 62 Pain: Pain Assessment Pain Scale: 0-10 Pain Score: 0-No  pain  See Function Navigator for Current Functional Status.   Therapy/Group: Individual Therapy  Taysia Rivere  Rovena Hearld, PTA  01/26/2018, 12:15 PM

## 2018-01-27 ENCOUNTER — Inpatient Hospital Stay (HOSPITAL_COMMUNITY): Payer: Medicare Other | Admitting: Occupational Therapy

## 2018-01-27 ENCOUNTER — Inpatient Hospital Stay (HOSPITAL_COMMUNITY): Payer: Medicare Other

## 2018-01-27 DIAGNOSIS — I639 Cerebral infarction, unspecified: Secondary | ICD-10-CM

## 2018-01-27 DIAGNOSIS — R071 Chest pain on breathing: Secondary | ICD-10-CM

## 2018-01-27 LAB — TROPONIN I

## 2018-01-27 NOTE — Progress Notes (Signed)
Patient resting comfortably in bed. MD notified of results. Continue with plan of care.

## 2018-01-27 NOTE — Progress Notes (Signed)
Occupational Therapy Session Note  Patient Details  Name: Christian Maxwell MRN: [id number] Date of Birth: 03-31-1933  Today's Date: 01/27/2018 OT Individual Time: 1510-1540 OT Individual Time Calculation (min): 30 min   Short Term Goals: Week 1:  OT Short Term Goal 1 (Week 1): n/a d/t ELOS  Skilled Therapeutic Interventions/Progress Updates:    Pt greeted in bed watching a favorite show. Refusing OOB tx despite encouragement. Amenable to work on UB strengthening while semi-reclined in bed. He completed B UE therapeutic exercise using 2# bar x10-15 reps with instruction on technique and pacing. At end of session pt was left in bed with all needs within reach and bed alarm set.    Therapy Documentation Precautions:  Precautions Precautions: Fall Restrictions Weight Bearing Restrictions: No Vital Signs: Therapy Vitals Temp: 97.7 F (36.5 C) Temp Source: Oral Pulse Rate: 63 Resp: (!) 22 BP: 104/71 Patient Position (if appropriate): Lying Oxygen Therapy SpO2: 96 % O2 Device: Room Air Pain: Pain Assessment Pain Scale: 0-10 Pain Score: 2  Pain Type: Acute pain Pain Location: Chest Pain Descriptors / Indicators: Aching(with deep breath) Pain Frequency: Intermittent Pain Intervention(s): Medication (See eMAR) ADL:      See Function Navigator for Current Functional Status.   Therapy/Group: Individual Therapy  Jeannett Dekoning A Winter Trefz 01/27/2018, 4:21 PM

## 2018-01-27 NOTE — Progress Notes (Addendum)
Patient reporting some nonspecific chest pain this morning. Patient denies shortness of breath. Patient started complaining of some chest pain in early morning per report and was given xanax and tylenol. Patient reports this has come and gone since he came in. Vitals stable. MD Posey Pronto notified. New orders received and PRN nitroglycerin given . Patient reported some relief with this. Continue to monitor. MD Posey Pronto called with results-order for cardiology consult. MD christopher consulted and order for Troponin placed about 1245. Patient ate lunch. So signs of distress. Continue to monitor.

## 2018-01-27 NOTE — Progress Notes (Signed)
Bilateral lower extremity venous duplex completed - Preliminary results - There is no evidence od a DVT or Baker's cyst. Toma Copier, RVS 01/27/2018, 1:07 PM

## 2018-01-27 NOTE — Progress Notes (Signed)
[place] PHYSICAL MEDICINE & REHABILITATION     PROGRESS NOTE  Subjective/Complaints:  Patient seen lying in bed this morning.  He states he slept well overnight.  Living from a nursing regarding chest pain.  Work-up initiated, cardiology consulted.  ROS: This AM, denies CP, SOB, nausea, vomiting diarrhea.  Objective: Vital Signs: Blood pressure 104/71, pulse 63, temperature 97.7 F (36.5 C), temperature source Oral, resp. rate (!) 22, height 5\' 9"  (1.753 m), weight 77.2 kg, SpO2 96 %. Dg Chest 1 View  Result Date: 01/27/2018 CLINICAL DATA:  Chest pain with deep breathing EXAM: CHEST  1 VIEW COMPARISON:  Four days ago FINDINGS: Normal heart size. Stable mild aortic tortuosity. Mildly low lung volumes. Stable mild blunting of the lateral right costophrenic sulcus. There is no edema, consolidation, effusion, or pneumothorax. IMPRESSION: No evidence of active disease Electronically Signed   By: Monte Fantasia M.D.   On: 01/27/2018 11:37   No results for input(s): WBC, HGB, HCT, PLT in the last 72 hours. No results for input(s): NA, K, CL, GLUCOSE, BUN, CREATININE, CALCIUM in the last 72 hours.  Invalid input(s): CO CBG (last 3)  No results for input(s): GLUCAP in the last 72 hours.  Wt Readings from Last 3 Encounters:  01/23/18 77.2 kg  01/23/18 79.7 kg  10/03/16 84.4 kg    Physical Exam:  BP 104/71 (BP Location: Right Arm)   Pulse 63   Temp 97.7 F (36.5 C) (Oral)   Resp (!) 22   Ht 5\' 9"  (1.753 m)   Wt 77.2 kg   SpO2 96%   BMI 25.13 kg/m  Constitutional: He appears well-developed and well-nourished.  HENT: Normocephalic and atraumatic.  Eyes: EOM are normal. No discharge.  Cardiovascular: Irregularly irregular.  No JVD.  Respiratory: Effort normal and breath sounds normal. +[place] GI: Bowel sounds are normal. He exhibits no distension.  Musculoskeletal: No edema or tenderness in extremities  Neurological: He is alert.  Follows simple commands.   Motor: Right upper  extremity/right lower extremity 4-4+/5 proximal distal, unchanged Left upper extremity: 4-/5 proximal to distal, unchanged Left lower extremity: 4/5 proximal to distal, unchanged Skin: Skin is warm and dry.  Psychiatric: His affect is flat.  Assessment/Plan: 1. Functional deficits secondary to left thalamic hemorrhage which require 3+ hours per day of interdisciplinary therapy in a comprehensive inpatient rehab setting. Physiatrist is providing close team supervision and 24 hour management of active medical problems listed below. Physiatrist and rehab team continue to assess barriers to discharge/monitor patient progress toward functional and medical goals.  Function:  Bathing Bathing position   Position: Sitting EOB  Bathing parts Body parts bathed by patient: Right arm, Left arm, Chest, Abdomen, Front perineal area, Buttocks, Right upper leg, Left upper leg, Right lower leg, Left lower leg    Bathing assist Assist Level: Touching or steadying assistance(Pt > 75%)      Upper Body Dressing/Undressing Upper body dressing   What is the patient wearing?: Pull over shirt/dress     Pull over shirt/dress - Perfomed by patient: Thread/unthread right sleeve, Thread/unthread left sleeve, Pull shirt over trunk Pull over shirt/dress - Perfomed by helper: Put head through opening        Upper body assist Assist Level: Touching or steadying assistance(Pt > 75%)      Lower Body Dressing/Undressing Lower body dressing   What is the patient wearing?: Pants, Non-skid slipper socks     Pants- Performed by patient: Thread/unthread right pants leg, Thread/unthread left pants leg,  Pull pants up/down     Non-skid slipper socks- Performed by helper: Don/doff right sock, Don/doff left sock                  Lower body assist Assist for lower body dressing: Touching or steadying assistance (Pt > 75%)      Toileting Toileting Toileting activity did not occur: (wearing gown  only) Toileting steps completed by patient: Adjust clothing prior to toileting, Performs perineal hygiene, Adjust clothing after toileting      Toileting assist Assist level: Touching or steadying assistance (Pt.75%)   Transfers Chair/bed transfer   Chair/bed transfer method: Ambulatory Chair/bed transfer assist level: Touching or steadying assistance (Pt > 75%) Chair/bed transfer assistive device: Armrests, Medical sales representative     Max distance: 200' Assist level: Touching or steadying assistance (Pt > 75%)   Wheelchair Wheelchair activity did not occur: N/A        Cognition Comprehension Comprehension assist level: Follows basic conversation/direction with extra time/assistive device  Expression Expression assist level: Expresses basic 90% of the time/requires cueing < 10% of the time.  Social Interaction Social Interaction assist level: Interacts appropriately 90% of the time - Needs monitoring or encouragement for participation or interaction.  Problem Solving Problem solving assist level: Solves basic 75 - 89% of the time/requires cueing 10 - 24% of the time  Memory Memory assist level: Recognizes or recalls 50 - 74% of the time/requires cueing 25 - 49% of the time    Medical Problem List and Plan: 1.  Gait disorder with history of dementia secondary to left thalamic hemorrhage secondary to small vessel disease versus Coumadin associated coagulopathy source  Continue CIR 2.  DVT Prophylaxis/Anticoagulation: SCDs.  Monitor for any bleeding episodes 3. Pain Management: Tylenol as needed 4. Mood: Aricept 10 mg daily, Xanax 0.25 mg twice daily as needed 5. Neuropsych: This patient is ?not fully capable of making decisions on his own behalf. 6. Skin/Wound Care: Routine skin checks 7. Fluids/Electrolytes/Nutrition: Routine in and outs   BMP within acceptable range on 9/12 8.  Chronic atrial fibrillation.COUMADIN ON HOLD due to thalamic hemorrhage.PLAN REPEAT CT  HEAD IN  2 WEEKS (~9/25) and resume anticoagulation and antiplatelet if ICH resolves. 9.  Hypertension.  Lopressor 25 mg twice daily  Relatively controlled on 9/15 10.  Hyperlipidemia.  Lipitor 11. Prediabetes  Monitor with increased mobility 12. Transaminitis  AST elevated on 9/12  Labs ordered for tomorrow  Cont to monitor 13. SOB: Improving  DG reviewed, suggesting atelectasis  Wean supplemental O2 as tolerated 14. Sleep disturbance  Continue meds   Melatonin started on 9/14 15.  Chest pain  Improved with nitro  ECG reviewed, atrial fibrillation with?  Anterior lead abnormalities  Vascular lower extremity ultrasound read pending  Troponin negative  Chest x-ray reviewed, relatively unremarkable  Cardiology consulted, recs pending  LOS (Days) 4 A FACE TO FACE EVALUATION WAS PERFORMED  Ankit Lorie Phenix 01/27/2018 3:01 PM

## 2018-01-28 ENCOUNTER — Inpatient Hospital Stay (HOSPITAL_COMMUNITY): Payer: Medicare Other | Admitting: Speech Pathology

## 2018-01-28 ENCOUNTER — Inpatient Hospital Stay (HOSPITAL_COMMUNITY): Payer: Medicare Other | Admitting: Physical Therapy

## 2018-01-28 ENCOUNTER — Inpatient Hospital Stay (HOSPITAL_COMMUNITY): Payer: Medicare Other | Admitting: Occupational Therapy

## 2018-01-28 DIAGNOSIS — I10 Essential (primary) hypertension: Secondary | ICD-10-CM

## 2018-01-28 LAB — COMPREHENSIVE METABOLIC PANEL
ALK PHOS: 51 U/L (ref 38–126)
ALT: 32 U/L (ref 0–44)
ANION GAP: 9 (ref 5–15)
AST: 38 U/L (ref 15–41)
Albumin: 3.1 g/dL — ABNORMAL LOW (ref 3.5–5.0)
BUN: 17 mg/dL (ref 8–23)
CALCIUM: 9.9 mg/dL (ref 8.9–10.3)
CO2: 30 mmol/L (ref 22–32)
Chloride: 103 mmol/L (ref 98–111)
Creatinine, Ser: 0.94 mg/dL (ref 0.61–1.24)
GFR calc non Af Amer: >60 mL/min (ref >60)
Glucose, Bld: 159 mg/dL — ABNORMAL HIGH (ref 70–99)
Potassium: 3.6 mmol/L (ref 3.5–5.1)
SODIUM: 142 mmol/L (ref 135–145)
Total Bilirubin: 1.2 mg/dL (ref 0.3–1.2)
Total Protein: 6.3 g/dL — ABNORMAL LOW (ref 6.5–8.1)

## 2018-01-28 NOTE — Progress Notes (Signed)
Occupational Therapy Session Note  Patient Details  Name: Christian Maxwell MRN: [id number] Date of Birth: 15-Jun-1932  Today's Date: 01/28/2018 OT Individual Time: 1100-1200 OT Individual Time Calculation (min): 60 min    Short Term Goals: Week 1:  OT Short Term Goal 1 (Week 1): n/a d/t ELOS  Skilled Therapeutic Interventions/Progress Updates:    Pt seen for OT session focusing on functional mobility, transfers, and standing balance/endurance during functional tasks. Pt in supine upon arrival, denying pain and agreeable to tx session. Pt declining bathing/dressing this AM.  Pt initially completed functional ambulation throughout unit using RW. Required VCs for RW management in functional context when completing toileting and grooming tasks at sink. Pt with difficulty incorporating VCs of management for RW. Therefore, trialed ambulation without AD. Able to complete functional ambulation at [facility]- close supervision without AD. Completed obstacle course of weaving through cones and picking items up off floor without using AD. In ADL apartment, pt practiced gathering commonly used items from around kitchen including refrigerator, pantry and overhead cabinet. Pt unable to recall where items were obtained from when returning items. He transported items throughout kitchen using B UEs and supervision for ambulation.  In therapy gym, completed standing table top activity standing on wedged foam mat. Pt able to complete moderately complex peg board puzzle pattern with slightly increased time. Pt returned to room at end of session. Completed grooming tasks standing at sink with supervision. Pt returned to bed at end of session per request. Left in supine with all needs in reach and bed alarm on.   Therapy Documentation Precautions:  Precautions Precautions: Fall Restrictions Weight Bearing Restrictions: No  See Function Navigator for Current Functional Status.   Therapy/Group: Individual  Therapy  Lariza Cothron L 01/28/2018, 7:44 AM

## 2018-01-28 NOTE — Progress Notes (Signed)
Speech Language Pathology Daily Session Note  Patient Details  Name: Christian Maxwell MRN: [id number] Date of Birth: February 17, 1933  Today's Date: 01/28/2018 SLP Individual Time: 1230-1300 SLP Individual Time Calculation (min): 30 min  Short Term Goals: Week 1: SLP Short Term Goal 1 (Week 1): STG=LTG due to ELOS   Skilled Therapeutic Interventions:  Pt received in bed drinking water from a straw partially reclined. Education provided to sit up more when eating or drinking but pt resistant - this is the "way he always does it." Pt friendly and agreeable to ST services. SLP facilitated session by providing supervision questions to navigate the newspaper to locate different pieces of information. SLP further provided functional task such as washing face with pt stating all items that he would need. Pt with no awareness of deficits and states that he will be fine at home with "some help." Suspect that pt will require 24 hour supervision d/t decreased awareness of deficits.      Function:  Eating Eating   Modified Consistency Diet: No Eating Assist Level: Set up assist for;More than reasonable amount of time           Cognition Comprehension Comprehension assist level: Follows basic conversation/direction with extra time/assistive device  Expression   Expression assist level: Expresses basic needs/ideas: With extra time/assistive device  Social Interaction Social Interaction assist level: Interacts appropriately 50 - 74% of the time - May be physically or verbally inappropriate.  Problem Solving Problem solving assist level: Solves basic 75 - 89% of the time/requires cueing 10 - 24% of the time  Memory Memory assist level: Recognizes or recalls 75 - 89% of the time/requires cueing 10 - 24% of the time    Pain Pain Assessment Pain Scale: 0-10 Pain Score: 0-No pain Faces Pain Scale: No hurt  Therapy/Group: Individual Therapy  Maliha Outten 01/28/2018, 12:57 PM

## 2018-01-28 NOTE — Progress Notes (Signed)
Social Work Patient ID: Christian Maxwell, male   DOB: [birth year], 84 y.o.   MRN: [id number]  Met with pt and daughter-natalie to discuss the polies she has found one does cover home health aide services. Made aware pt will need 24 hr for safety and assist at times. She will begin looking for an agency to cover this. Aware team conference on Wed and will possibly be discharged by the end of this week. Pt wants to leave ASAP he doesn't want to be here. Made aware arrangements need to be made before he goes home. Daughter brought him [employer] today and he was happy.

## 2018-01-28 NOTE — Progress Notes (Signed)
Physical Therapy Session Note  Patient Details  Name: Christian Maxwell MRN: [id number] Date of Birth: 1932/10/20  Today's Date: 01/28/2018 PT Individual Time: 1300-1400 PT Individual Time Calculation (min): 60 min   Short Term Goals: Week 1:  PT Short Term Goal 1 (Week 1): =LTG due to estimated LOS  Skilled Therapeutic Interventions/Progress Updates:  Pt received lying in bed with daughter [name] present. Pt is agreeable to PT treatment today and reports no pain. Ambulation to rehab gym ~220 ft with S no AD. Pt walks with improved gait speed. Floor transfer to/from mat; S when lowering and minA with rising with cues for technique and hand placement. Asecended/descened four 6 inch steps with B handrails and min guard 3 times with reciprocal gait pattern. Sit>stand 2x10. Standing balance exercises on airex with dynamic ball catching. Progressed to standing balance exercises on rockerboard; challenged in the sagittal and frontal planes. Pt not willing to attempt ball catching/throwing on rockerboard due to fear of falling. Dual tasks used in today's session to increased difficulty of tasks and challenge the pt cognitively. CGA ambulation with head turns to find objects of varying heights in hallway, including picking item up off the floor with min guard. Pt required several trials of walking down hallway and verbal cues to find all of the objects. Dynavision with natural stance on solid ground and then progressed to standing on airex. First trial on airex reaction time avg was 2.35 seconds and second trial was 1.88 seconds. Nustep with bil UE and LE on resistance 3 for 6 minutes with cues to keep steps/min at at least 30 to improve cardiovascular endurance. Return to room with S ambulation without an AD. Pt remained in bed with alarm intact and all needs in reach.   Therapy Documentation Precautions:  Precautions Precautions: Fall Restrictions Weight Bearing Restrictions: No   See Function  Navigator for Current Functional Status.   Therapy/Group: Individual Therapy  [place] Konstantine Gervasi, SPT 01/28/2018, 3:11 PM

## 2018-01-28 NOTE — Progress Notes (Signed)
Physical Therapy Session Note  Patient Details  Name: Christian Maxwell MRN: [id number] Date of Birth: 09-30-1932  Today's Date: 01/28/2018 PT Individual Time: 0915-1000 PT Individual Time Calculation (min): 45 min   Short Term Goals: Week 1:  PT Short Term Goal 1 (Week 1): =LTG due to estimated LOS  Skilled Therapeutic Interventions/Progress Updates:    Patient received in bed watching TV, requiring high levels of encouragement to participate due to his favorite show being on. Able to perform all functional bed mobility with S and transfers with S and RW today, performed toileting and self-care in bathroom with S from therapist as well. Monitored SpO2 during session per RN request as he had desatted down to 87% earlier on room air, however he maintained between 99-100% on room air with activity today, RN aware. Continued to work on gait training of distances approximately 150-253f with RW and also focused on NMR strategies to improve functional balance and balance recovery. He was again able to use restroom with S from therapist at [facility] and was left in bed with all needs met, alarm activated.   Therapy Documentation Precautions:  Precautions Precautions: Fall Restrictions Weight Bearing Restrictions: No General:   Vital Signs:  Pain: Pain Assessment Pain Scale: 0-10 Pain Score: 0-No pain Faces Pain Scale: No hurt   See Function Navigator for Current Functional Status.   Therapy/Group: Individual Therapy  KDeniece ReePT, DPT, CBIS  Supplemental [facility]   Pager [phone] Rehab Office [phone]  01/28/2018, 12:18 PM

## 2018-01-28 NOTE — Progress Notes (Signed)
[place] PHYSICAL MEDICINE & REHABILITATION     PROGRESS NOTE  Subjective/Complaints:  Patient seen lying in bed this morning. He states he slept well overnight. He states his chest pain is resolved. He is laying eating breakfast, encouraged patient to sit up. However he states that he is comfortable.  ROS: Denies CP, SOB, nausea, vomiting diarrhea.  Objective: Vital Signs: Blood pressure (!) 146/78, pulse 62, temperature 97.6 F (36.4 C), temperature source Oral, resp. rate 18, height 5\' 9"  (1.753 m), weight 77.2 kg, SpO2 95 %. Dg Chest 1 View  Result Date: 01/27/2018 CLINICAL DATA:  Chest pain with deep breathing EXAM: CHEST  1 VIEW COMPARISON:  Four days ago FINDINGS: Normal heart size. Stable mild aortic tortuosity. Mildly low lung volumes. Stable mild blunting of the lateral right costophrenic sulcus. There is no edema, consolidation, effusion, or pneumothorax. IMPRESSION: No evidence of active disease Electronically Signed   By: Monte Fantasia M.D.   On: 01/27/2018 11:37   No results for input(s): WBC, HGB, HCT, PLT in the last 72 hours. No results for input(s): NA, K, CL, GLUCOSE, BUN, CREATININE, CALCIUM in the last 72 hours.  Invalid input(s): CO CBG (last 3)  No results for input(s): GLUCAP in the last 72 hours.  Wt Readings from Last 3 Encounters:  01/23/18 77.2 kg  01/23/18 79.7 kg  10/03/16 84.4 kg    Physical Exam:  BP (!) 146/78 (BP Location: Right Arm)   Pulse 62   Temp 97.6 F (36.4 C) (Oral)   Resp 18   Ht 5\' 9"  (1.753 m)   Wt 77.2 kg   SpO2 95%   BMI 25.13 kg/m  Constitutional: He appears well-developed and well-nourished.  HENT: Normocephalic and atraumatic.  Eyes: EOM are normal. No discharge.  Cardiovascular: irregularly irregular. No JVD.  Respiratory: Effort normal and breath sounds normal.  GI: Bowel sounds are normal. He exhibits no distension.  Musculoskeletal: No edema or tenderness in extremities  Neurological: He is alert.  Follows  simple commands.   Motor: Right upper extremity/right lower extremity 4-4+/5 proximal distal, stable Left upper extremity: 4-/5 proximal to distal, stable Left lower extremity: 4/5 proximal to distal, stable Skin: Skin is warm and dry.  Psychiatric: His affect is flat.  Assessment/Plan: 1. Functional deficits secondary to left thalamic hemorrhage which require 3+ hours per day of interdisciplinary therapy in a comprehensive inpatient rehab setting. Physiatrist is providing close team supervision and 24 hour management of active medical problems listed below. Physiatrist and rehab team continue to assess barriers to discharge/monitor patient progress toward functional and medical goals.  Function:  Bathing Bathing position   Position: Sitting EOB  Bathing parts Body parts bathed by patient: Right arm, Left arm, Chest, Abdomen, Front perineal area, Buttocks, Right upper leg, Left upper leg, Right lower leg, Left lower leg    Bathing assist Assist Level: Touching or steadying assistance(Pt > 75%)      Upper Body Dressing/Undressing Upper body dressing   What is the patient wearing?: Pull over shirt/dress     Pull over shirt/dress - Perfomed by patient: Thread/unthread right sleeve, Thread/unthread left sleeve, Pull shirt over trunk Pull over shirt/dress - Perfomed by helper: Put head through opening        Upper body assist Assist Level: Touching or steadying assistance(Pt > 75%)      Lower Body Dressing/Undressing Lower body dressing   What is the patient wearing?: Pants, Non-skid slipper socks     Pants- Performed by patient: Thread/unthread  right pants leg, Thread/unthread left pants leg, Pull pants up/down     Non-skid slipper socks- Performed by helper: Don/doff right sock, Don/doff left sock                  Lower body assist Assist for lower body dressing: Touching or steadying assistance (Pt > 75%)      Toileting Toileting Toileting activity did not occur:  (wearing gown only) Toileting steps completed by patient: Adjust clothing prior to toileting, Performs perineal hygiene, Adjust clothing after toileting   Toileting Assistive Devices: Grab bar or rail  Toileting assist Assist level: Touching or steadying assistance (Pt.75%)   Transfers Chair/bed transfer   Chair/bed transfer method: Ambulatory Chair/bed transfer assist level: Touching or steadying assistance (Pt > 75%) Chair/bed transfer assistive device: Armrests, Medical sales representative     Max distance: 200' Assist level: Touching or steadying assistance (Pt > 75%)   Wheelchair Wheelchair activity did not occur: N/A        Cognition Comprehension Comprehension assist level: Follows basic conversation/direction with extra time/assistive device  Expression Expression assist level: Expresses basic 90% of the time/requires cueing < 10% of the time.  Social Interaction Social Interaction assist level: Interacts appropriately 90% of the time - Needs monitoring or encouragement for participation or interaction.  Problem Solving Problem solving assist level: Solves basic 75 - 89% of the time/requires cueing 10 - 24% of the time  Memory Memory assist level: Recognizes or recalls 50 - 74% of the time/requires cueing 25 - 49% of the time    Medical Problem List and Plan: 1.  Gait disorder with history of dementia secondary to left thalamic hemorrhage secondary to small vessel disease versus Coumadin associated coagulopathy source  Continue CIR 2.  DVT Prophylaxis/Anticoagulation: SCDs.  Monitor for any bleeding episodes 3. Pain Management: Tylenol as needed 4. Mood: Aricept 10 mg daily, Xanax 0.25 mg twice daily as needed 5. Neuropsych: This patient is ?not fully capable of making decisions on his own behalf. 6. Skin/Wound Care: Routine skin checks 7. Fluids/Electrolytes/Nutrition: Routine in and outs   BMP within acceptable range on 9/12 8.  Chronic atrial  fibrillation.COUMADIN ON HOLD due to thalamic hemorrhage.PLAN REPEAT CT HEAD IN  2 WEEKS (~9/25) and resume anticoagulation and antiplatelet if ICH resolves. 9.  Hypertension.  Lopressor 25 mg twice daily  Slightly labile on 9/16 10.  Hyperlipidemia.  Lipitor 11. Prediabetes  Monitor with increased mobility 12. Transaminitis  AST elevated on 9/12  Labs pending  Cont to monitor 13. SOB: Improving  DG reviewed, suggesting atelectasis  Wean supplemental O2 as tolerated 14. Sleep disturbance  Continue meds   Melatonin started on 9/14  Improved 15.  Chest pain  Improved with nitro  ECG reviewed, atrial fibrillation with?  Anterior lead abnormalities  Vascular lower extremity ultrasound read remains pending, prelim negative  Troponin negative  Chest x-ray reviewed, relatively unremarkable  Cardiology consulted, per nursing, per discussion with Cards, no changes in management. Will discuss again.  LOS (Days) 5 A FACE TO FACE EVALUATION WAS PERFORMED  Christian Maxwell 01/28/2018 8:47 AM

## 2018-01-29 ENCOUNTER — Inpatient Hospital Stay (HOSPITAL_COMMUNITY): Payer: Medicare Other | Admitting: Occupational Therapy

## 2018-01-29 ENCOUNTER — Inpatient Hospital Stay (HOSPITAL_COMMUNITY): Payer: Medicare Other | Admitting: Speech Pathology

## 2018-01-29 ENCOUNTER — Encounter (HOSPITAL_COMMUNITY): Payer: Medicare Other | Admitting: Psychology

## 2018-01-29 ENCOUNTER — Inpatient Hospital Stay (HOSPITAL_COMMUNITY): Payer: Medicare Other | Admitting: Physical Therapy

## 2018-01-29 DIAGNOSIS — I482 Chronic atrial fibrillation: Secondary | ICD-10-CM

## 2018-01-29 NOTE — Consult Note (Signed)
Neuropsychological Consultation   Patient:   Christian Maxwell   DOB:   26-Mar-1933  MR Number:  [id number]  Location:  [place] [address] [facility] [place] [id number] [place] [address] Dept: [place]: [phone]           Date of Service:   01/29/2018  Start Time:   2:30 PM End Time:   3:30 PM  Provider/Observer:  Ilean Skill, Psy.D.       Clinical Neuropsychologist       Billing Code/Service: [phone] 4 Units  Chief Complaint:    Christian Maxwell is an 84 year old male with a history of dementia and has been maintained on Aricept.  However, the patient does make concerted efforts to deny dementia or any changes in cognitive functioning and is at times refused to take his Aricept.  The patient has a history of chronic atrial fibrillation maintained on Coumadin, colon cancer, and hypertension.  The patient has had a couple of falls in the past couple of months.  The patient presented on 01/21/2018 with nonspecific chest pain, dizziness and generalized weakness.  CT reviewe showing left thalamic hemorrhage.  Per report and MRI, there was a 7 mm acute left thalamic hemorrhage.  There did not appear to be any underlying ischemic infarct or mass lesion.  The patient does appear or tend to be quite obstinate at times and tends to essentially do what he wants to do.  He denies any cognitive difficulties but during the clinical interview today there did appear to be some clear cognitive difficulties or deficits beyond those simply associated with his thalamic hemorrhage.  Reason for Service:  The patient was referred for neuropsychological consultation due to coping issues following a thalamic hemorrhage as well as cognitive functioning concerns with a prior diagnosis of dementia with prescription of Aricept.  Below is the HPI for the current admission.  HPI: Christian Maxwell is an 84 year old right-handed male with  history of dementia maintained on Aricept.  Chronic atrial fibrillation maintained on Coumadin, colon cancer, hypertension.  Per chart review, patient, and family, lives alone reported to be independent driving short distances.  2 level home bedroom upstairs.  Family in the vicinity question assistance on discharge.  There is been reports of 2 falls in the past couple of months.  Presented 01/21/2018 with nonspecific chest pain, dizziness and generalized weakness.  CT reviewed, showing left thalamic hemorrhage. Per report and MRI, 7 mm acute left thalamic hemorrhage.  No underlying ischemic infarct or mass lesion.  Echocardiogram with ejection fraction of 65% no wall motion abnormalities.  Troponin 0.06, INR 2.02.  Coumadin held due to bleed.  Carotid Dopplers with no ICA stenosis.  Cardiology follow-up for mildly elevated troponin nonspecific chest pain did not feel to be acute coronary syndrome and monitored.  In regards to anticoagulation neurology recommends hold on anticoagulation due to Martin repeat CT scan of the head in 2 weeks and if ICH resolved may resume anticoagulation antiplatelet therapy.  Tolerating a regular diet.  Physical and occupational therapy evaluations completed with recommendations of physical medicine rehab consult.  Patient was admitted for a comprehensive rehab program.  Current Status:  During the clinical interview today, the patient initially presented well oriented and able to correctly identify year, month, and day of the month.  He was also able to give some basic background as to why he was in the hospital at the current time.  The  patient was able to give historical information regarding the death of his wife recently and some of his more recent activities.  However, the patient became very vague when more specific questions were asked about his most recent functioning and confabulated on a couple of questions with answers that would be appropriate but were not confirmed by  treatment team.  While the patient presented is rather pleasant and ready to talk if the patient was asked to do something that he was not particularly interested in he would refuse.  I attempted to get the patient to take some structured neuropsychological test but when I talked about the reason for the testing the patient simply stated that he did not feel like he needed to do this.  While formal testing of memory was not able to be conducted the patient clearly appeared to have difficulty learning new information.  The patient had difficulty remembering my name or specific reason for me being in the room even though he attempted to be pleasant during our interaction.  Behavioral Observation: Christian Maxwell  presents as a 84 y.o.-year-old Right Caucasian Male who appeared his stated age. his dress was Appropriate and he was Well Groomed and his manners were Appropriate to the situation.  his participation was indicative of Appropriate and Redirectable behaviors.  There were any physical disabilities noted.  he displayed an inappropriate level of cooperation and motivation.     Interactions:    Active Appropriate and Redirectable  Attention:   abnormal and attention span appeared shorter than expected for age  Memory:   abnormal; remote memory intact, recent memory impaired  Visuo-spatial:  not examined  Speech (Volume):  normal  Speech:   normal; normal  Thought Process:  Coherent and Relevant  Though Content:  WNL; not suicidal and not homicidal  Orientation:   person, place and time/date  Judgment:   Fair  Planning:   Poor  Affect:    Defensive and Lethargic  Mood:    Irritable  Insight:   Fair  Intelligence:   high   Medical History:   Past Medical History:  Diagnosis Date  . Abscess 7/13   abd abscess  . Cancer [facility])    colon cancer  . Chronic atrial fibrillation ([place])   . History of colon cancer 2000   T3N0 s/p colectomy  . History of echocardiogram    a. Echo  2/13:  Mild LVH, EF 55-60%, mild MR, moderate LAE, mild to moderate RAE;  b. Echo 1/17: mild LVH, vigorous LVF, EF 65-70%, no RWMA, trivial AI, trivial MR, severe LAE, mild RAE, mod TR, PASP 32 mmHg  . History of stress test    a. Cardiolite in 9/04: EF 67%, no ischemia.;  b. Myoview 6/16: no ischemia   . Hx of adenomatous colonic polyps   . Hyperlipidemia   . Hypertension   . METHICILLIN RESISTANT STAPHYLOCOCCUS AUREUS INFECTION 12/12/2006   Annotation: stitch abcess in lower abdomen after colon cancer resection Qualifier: Diagnosis of  By: [name] MD, [name]    . Myocardial infarction ([place]) 1985  . Postoperative stitch abscess 07/19/2011  . PVC's (premature ventricular contractions)   . Syncope and collapse    pt denies  . Thyroid disease    followed by [name]   Family Med/Psych History:  Family History  Problem Relation Age of Onset  . Stroke Mother   . Stroke Father   . Hypertension Father   . CAD Father 86  MI  . Dementia Father   . Stroke Sister 66  . Stroke Brother   . Stroke Brother   . Diabetes Brother   . CAD Brother 31       MI  . Dementia Sister 67  . Dementia Brother   . Colon cancer Cousin        x4 total with colon cancer  . Cancer Neg Hx   . Colon polyps Neg Hx   . Rectal cancer Neg Hx   . Stomach cancer Neg Hx     Risk of Suicide/Violence: low the patient specifically denies any suicidal or homicidal ideation.  Impression/DX:  Christian Maxwell is an 84 year old male with a history of dementia and has been maintained on Aricept.  However, the patient does make concerted efforts to deny dementia or any changes in cognitive functioning and is at times refused to take his Aricept.  The patient has a history of chronic atrial fibrillation maintained on Coumadin, colon cancer, and hypertension.  The patient has had a couple of falls in the past couple of months.  The patient presented on 01/21/2018 with nonspecific chest pain, dizziness and generalized  weakness.  CT reviewe showing left thalamic hemorrhage.  Per report and MRI, there was a 7 mm acute left thalamic hemorrhage.  There did not appear to be any underlying ischemic infarct or mass lesion.  The patient does appear or tend to be quite obstinate at times and tends to essentially do what he wants to do.  He denies any cognitive difficulties but during the clinical interview today there did appear to be some clear cognitive difficulties or deficits beyond those simply associated with his thalamic hemorrhage.  During the clinical interview today, the patient initially presented well oriented and able to correctly identify year, month, and day of the month.  He was also able to give some basic background as to why he was in the hospital at the current time.  The patient was able to give historical information regarding the death of his wife recently and some of his more recent activities.  However, the patient became very vague when more specific questions were asked about his most recent functioning and confabulated on a couple of questions with answers that would be appropriate but were not confirmed by treatment team.  While the patient presented as rather pleasant and ready to talk, if the patient was asked to do something that he was not particularly interested in he would refuse.  I attempted to get the patient to take some structured neuropsychological test but when I talked about the reason for the testing the patient simply stated that he did not feel like he needed to do this.  While formal testing of memory was not able to be conducted the patient clearly appeared to have difficulty learning new information.  The patient had difficulty remembering my name or specific reason for me being in the room even though he attempted to be pleasant during our interaction.   Diagnosis:    Dementia of unknown etiology        Electronically Signed   _______________________ Ilean Skill,  Psy.D.

## 2018-01-29 NOTE — Progress Notes (Signed)
[facility] PHYSICAL MEDICINE & REHABILITATION     PROGRESS NOTE  Subjective/Complaints:  Patient seen laying in bed this morning.  He states he needs to the restroom.  He denies chest pain and shortness of breath.  ROS: Denies CP, SOB, nausea, vomiting diarrhea.  Objective: Vital Signs: Blood pressure 109/80, pulse (!) 53, temperature 98.3 F (36.8 C), temperature source Oral, resp. rate 18, height 5\' 9"  (1.753 m), weight 77.2 kg, SpO2 96 %. Dg Chest 1 View  Result Date: 01/27/2018 CLINICAL DATA:  Chest pain with deep breathing EXAM: CHEST  1 VIEW COMPARISON:  Four days ago FINDINGS: Normal heart size. Stable mild aortic tortuosity. Mildly low lung volumes. Stable mild blunting of the lateral right costophrenic sulcus. There is no edema, consolidation, effusion, or pneumothorax. IMPRESSION: No evidence of active disease Electronically Signed   By: Monte Fantasia M.D.   On: 01/27/2018 11:37   No results for input(s): WBC, HGB, HCT, PLT in the last 72 hours. Recent Labs    01/28/18 0837  NA 142  K 3.6  CL 103  GLUCOSE 159*  BUN 17  CREATININE 0.94  CALCIUM 9.9   CBG (last 3)  No results for input(s): GLUCAP in the last 72 hours.  Wt Readings from Last 3 Encounters:  01/23/18 77.2 kg  01/23/18 79.7 kg  10/03/16 84.4 kg    Physical Exam:  BP 109/80 (BP Location: Right Arm)   Pulse (!) 53   Temp 98.3 F (36.8 C) (Oral)   Resp 18   Ht 5\' 9"  (1.753 m)   Wt 77.2 kg   SpO2 96%   BMI 25.13 kg/m  Constitutional: He appears well-developed and well-nourished.  HENT: Normocephalic and atraumatic.  Eyes: EOM are normal. No discharge.  Cardiovascular: Irregularly irregular.  No JVD.  Respiratory: Effort normal and breath sounds normal.  GI: Bowel sounds are normal. He exhibits no distension.  Musculoskeletal: No edema or tenderness in extremities  Neurological: He is alert.  Follows simple commands.   Motor: Right upper extremity/right lower extremity 4-4+/5 proximal distal,  unchanged Left upper extremity: 4-/5 proximal to distal, unchanged Left lower extremity: 4/5 proximal to distal, unchanged Skin: Skin is warm and dry.  Psychiatric: His affect is flat.  Assessment/Plan: 1. Functional deficits secondary to left thalamic hemorrhage which require 3+ hours per day of interdisciplinary therapy in a comprehensive inpatient rehab setting. Physiatrist is providing close team supervision and 24 hour management of active medical problems listed below. Physiatrist and rehab team continue to assess barriers to discharge/monitor patient progress toward functional and medical goals.  Function:  Bathing Bathing position   Position: Sitting EOB  Bathing parts Body parts bathed by patient: Right arm, Left arm, Chest, Abdomen, Front perineal area, Buttocks, Right upper leg, Left upper leg, Right lower leg, Left lower leg    Bathing assist Assist Level: Touching or steadying assistance(Pt > 75%)      Upper Body Dressing/Undressing Upper body dressing   What is the patient wearing?: Pull over shirt/dress     Pull over shirt/dress - Perfomed by patient: Thread/unthread right sleeve, Thread/unthread left sleeve, Pull shirt over trunk Pull over shirt/dress - Perfomed by helper: Put head through opening        Upper body assist Assist Level: Touching or steadying assistance(Pt > 75%)      Lower Body Dressing/Undressing Lower body dressing   What is the patient wearing?: Pants, Non-skid slipper socks     Pants- Performed by patient: Thread/unthread right pants  leg, Thread/unthread left pants leg, Pull pants up/down     Non-skid slipper socks- Performed by helper: Don/doff right sock, Don/doff left sock                  Lower body assist Assist for lower body dressing: Touching or steadying assistance (Pt > 75%)      Toileting Toileting Toileting activity did not occur: (wearing gown only) Toileting steps completed by patient: Performs perineal hygiene,  Adjust clothing prior to toileting, Adjust clothing after toileting Toileting steps completed by helper: Performs perineal hygiene, Adjust clothing after toileting(per Linden Dolin, NT) Toileting Assistive Devices: Grab bar or rail  Toileting assist Assist level: Supervision or verbal cues   Transfers Chair/bed transfer   Chair/bed transfer method: Ambulatory Chair/bed transfer assist level: Supervision or verbal cues Chair/bed transfer assistive device: Armrests, Medical sales representative     Max distance: ~38ft Assist level: Supervision or Psychologist, clinical activity did not occur: N/A        Cognition Comprehension Comprehension assist level: Follows basic conversation/direction with extra time/assistive device  Expression Expression assist level: Expresses basic 90% of the time/requires cueing < 10% of the time.  Social Interaction Social Interaction assist level: Interacts appropriately 90% of the time - Needs monitoring or encouragement for participation or interaction.  Problem Solving Problem solving assist level: Solves basic 75 - 89% of the time/requires cueing 10 - 24% of the time  Memory Memory assist level: Recognizes or recalls 50 - 74% of the time/requires cueing 25 - 49% of the time    Medical Problem List and Plan: 1.  Gait disorder with history of dementia secondary to left thalamic hemorrhage secondary to small vessel disease versus Coumadin associated coagulopathy source  Continue CIR 2.  DVT Prophylaxis/Anticoagulation: SCDs.  Monitor for any bleeding episodes 3. Pain Management: Tylenol as needed 4. Mood: Aricept 10 mg daily, Xanax 0.25 mg twice daily as needed 5. Neuropsych: This patient is ?not fully capable of making decisions on his own behalf. 6. Skin/Wound Care: Routine skin checks 7. Fluids/Electrolytes/Nutrition: Routine in and outs   BMP within acceptable range on 9/12 8.  Chronic atrial fibrillation.COUMADIN ON HOLD  due to thalamic hemorrhage.PLAN REPEAT CT HEAD IN  2 WEEKS (~9/25) and resume anticoagulation and antiplatelet if ICH resolves. 9.  Hypertension.  Lopressor 25 mg twice daily  Controlled on 9/17 10.  Hyperlipidemia.  Lipitor 11. Prediabetes  Labile on 9/16 12. Transaminitis: Resolved  Cont to monitor 13. SOB: Improved  DG reviewed, suggesting atelectasis  Wean supplemental O2 as tolerated 14. Sleep disturbance  Continue meds   Melatonin started on 9/14  Improved 15.  Chest pain: Resolved  Improved with nitro  ECG reviewed, atrial fibrillation with?  Anterior lead abnormalities  Vascular lower extremity ultrasound, negative  Troponin negative  Chest x-ray reviewed, relatively unremarkable  Cardiology consulted, per nursing, per discussion with Cards, no changes in management. Discuss again.  LOS (Days) 6 A FACE TO FACE EVALUATION WAS PERFORMED  Ankit Lorie Phenix 01/29/2018 8:52 AM

## 2018-01-29 NOTE — Progress Notes (Signed)
Occupational Therapy Session Note  Patient Details  Name: Christian Maxwell MRN: [id number] Date of Birth: [birth year]  Today's Date: 01/29/2018 OT Individual Time: 1100-1200 OT Individual Time Calculation (min): 60 min    Short Term Goals: Week 1:  OT Short Term Goal 1 (Week 1): n/a d/t ELOS  Skilled Therapeutic Interventions/Progress Updates:    Pt seen for OT ADL bathing/dressing session. Pt in supine upon arrival, denying pain and agreeable to tx session. Pt agreeable to showering this session. He ambulated throughout room with supervision, no AD, in order to gather clothing and needed supplies for showering task including obtaining towels from overhead cabinet.  Pt required encouragement and education for use of shower chair in shower during bathing task as pt requesting to stand to bathe as well as to sit to don/doff clothing in order to decrease fall risk. He completed bathing task seated on shower chair with supervision.  He returned to sitting EOB to dress. Grooming tasks completed standing at sink at distant supervision-mod I level. Pt ambulated throughout unit and completed IADL tasks at supervision level in prep for planned d/c home. Pt completed bed making activity, laundry task including placing and removing clothes from washer/dryer, standing to fold clothes and transporting them throughout unit with supervision.  In therapy gym, completed x2 sets of 10 sit<>stand without UE support with supervision and VCs for controlled movements. Pt returned to room at end of session and requesting return to supine. Pt left with all needs in reach, bed alarm on.   Therapist spoke with pt's daughter later in day regarding pt's CLOF, recommendations for shower chair and seated bathing tasks, bathroom modifications for increased safety, d/c recommendations and d/c planning.    Precautions Precautions: Fall Restrictions Weight Bearing Restrictions: No Pain: Pain Assessment Pain Score: 0-No  pain Faces Pain Scale: No hurt  See Function Navigator for Current Functional Status.   Therapy/Group: Individual Therapy  Anida Deol L 01/29/2018, 7:09 AM

## 2018-01-29 NOTE — Progress Notes (Signed)
Physical Therapy Session Note  Patient Details  Name: Christian Maxwell MRN: [id number] Date of Birth: 14-Jun-1932  Today's Date: 01/29/2018 PT Individual Time: 1300-1415 PT Individual Time Calculation (min): 75 min   Short Term Goals: Week 1:  PT Short Term Goal 1 (Week 1): =LTG due to estimated LOS  Skilled Therapeutic Interventions/Progress Updates: Pt received in bed, daughter present for session; denies pain and agreeable to treatment. Gait indoors/outdoors with no AD and close S, min guard on unlevel surfaces and stairs in community setting including inclines/declines, ambulation in grass, gravel. Toileted with S for ambulation in/out of bathroom; modI clothing management. Engaged in standing balance activity on foam wedge for passive gastroc stretch while performing BUE complex pipe tree for cognitive dual task and dynamic balance challenge. Standing basketball throws at goal with min guard/close S for balance; side stepping R/L to practice throws from different angles. Assessed FGA as below with results indicative of increased risk for falls; educated pt and daughter regarding increased risk and reiterated recommendation that pt have 24/7 S initially when d/c home. Returned to bed at end of session, remained in bed, alarm intact and all needs in reach.   Functional Gait Assessment (FGA) Requirements: A marked 6-m (20-ft) walkway that is marked with a 30.48-cm (12-in) width.  _3_ 1. GAIT LEVEL SURFACE Instructions: Walk at your normal speed from here to the next mark (6 m[20 ft]). Grading: [place] the highest category that applies. (3) Normal-Walks 6 m (20 ft) in less than 5.5 seconds, no assistive devices, good speed, no evidence for imbalance, normal gait pattern, deviates no more than 15.24 cm (6 in) outside of the 30.48-cm (12-in) walkway width. (2) Mild impairment-Walks 6 m (20 ft) in less than 7 seconds but greater than 5.5 seconds, uses assistive device, slower speed, mild gait  deviations, or deviates 15.24-25.4 cm (6-10 in) outside of the 30.48-cm (12-in) walkway width. (1) Moderate impairment-Walks 6 m (20 ft), slow speed, abnormal gait pattern, evidence for imbalance, or deviates 25.4-38.1 cm (10-15 in) outside of the 30.48-cm (12-in) walkway width. Requires more than 7 seconds to ambulate 6 m (20 ft). (0) Severe impairment-Cannot walk 6 m (20 ft) without assistance,severe gait deviations or imbalance, deviates greater than 38.1 cm (15 in) outside of the 30.48-cm (12-in) walkway width or reaches and touches the wall.  _3_ 2. CHANGE IN GAIT SPEED Instructions: Begin walking at your normal pace (for 1.5 m [5 ft]). When I tell you "go," walk as fast as you can (for 1.5 m [5 ft]). When I tell you "slow," walk as slowly as you can (for 1.5 m [5 ft]). Grading: [place] the highest category that applies. (3) Normal-Able to smoothly change walking speed without loss of balance or gait deviation. Shows a significant difference in walking speeds between normal, fast, and slow speeds. Deviates no more than 15.24 cm (6 in) outside of the 30.48-cm (12-in) walkway width. (2) Mild impairment-Is able to change speed but demonstrates mild gait deviations, deviates 15.24-25.4 cm (6-10 in) outside of the 30.48-cm (12-in) walkway width, or no gait deviations but unable to achieve a significant change in velocity, or uses an assistive device. (1) Moderate impairment-Makes only minor adjustments to walking speed, or accomplishes a change in speed with significant gait deviations, deviates 25.4-38.1 cm (10-15 in) outside the 30.48-cm (12-in) walkway width, or changes speed but loses balance but is able to recover and continue walking. (0) Severe impairment-Cannot change speeds, deviates greater than 38.1 cm (15 in) outside 30.48-cm (12-in)  walkway width, or loses balance and has to reach for wall or be caught.  _2_ 3. GAIT WITH HORIZONTAL HEAD TURNS Instructions: Walk from here to the next mark 6 m  (20 ft) away. Begin walking at your normal pace. Keep walking straight; after 3 steps, turn your head to the right and keep walking straight while looking to the right. After 3 more steps, turn your head to the left and keep walking straight while looking left. Continue alternating looking right and left every 3 steps until you have completed 2 repetitions in each direction. Grading: [place] the highest category that applies. (3) Normal-Performs head turns smoothly with no change in gait. Deviates no more than 15.24 cm (6 in) outside 30.48-cm (12-in) walkway width. (2) Mild impairment-Performs head turns smoothly with slight change in gait velocity (eg, minor disruption to smooth gait path), deviates 15.24-25.4 cm (6-10 in) outside 30.48-cm (12-in) walkway width, or uses an assistive device.  (1) Moderate impairment-Performs head turns with moderate change in gait velocity, slows down, deviates 25.4-38.1 cm (10-15 in) outside 30.48-cm (12-in) walkway width but recovers, can continue to walk. (0) Severe impairment-Performs task with severe disruption of gait (eg, staggers 38.1 cm [15 in] outside 30.48-cm (12-in) walkway width, loses balance, stops, or reaches for wall).  _3_ 4. GAIT WITH VERTICAL HEAD TURNS Instructions: Walk from here to the next mark (6 m [20 ft]). Begin walking at your normal pace. Keep walking straight; after 3 steps, tip your head up and keep walking straight while looking up. After 3 more steps, tip your head down, keep walking straight while looking down. Continue alternating looking up and down every 3 steps until you have completed 2 repetitions in each direction. Grading: [place] the highest category that applies. (3) Normal-Performs head turns with no change in gait. Deviates no more than 15.24 cm (6 in) outside 30.48-cm (12-in) walkway width. (2) Mild impairment-Performs task with slight change in gait velocity (eg, minor disruption to smooth gait path), deviates 15.24-25.4 cm (6-10  in) outside 30.48-cm (12-in) walkway width or uses assistive device. (1) Moderate impairment-Performs task with moderate change in gait velocity, slows down, deviates 25.4-38.1 cm (10-15 in) outside 30.48-cm (12-in) walkway width but recovers, can continue to walk. (0) Severe impairment-Performs task with severe disruption of gait (eg, staggers 38.1 cm [15 in] outside 30.48-cm (12-in) walkway width, loses balance, stops, reaches for wall).  _3_ 5. GAIT AND PIVOT TURN Instructions: Begin with walking at your normal pace. When I tell you, "turn and stop," turn as quickly as you can to face the opposite direction and stop. Grading: [place] the highest category that applies. (3) Normal-Pivot turns safely within 3 seconds and stops quickly with no loss of balance. (2) Mild impairment-Pivot turns safely in _3 seconds and stops with no loss of balance, or pivot turns safely within 3 seconds and stops with mild imbalance, requires small steps to catch balance. (1) Moderate impairment-Turns slowly, requires verbal cueing, or requires several small steps to catch balance following turn and stop. (0) Severe impairment-Cannot turn safely, requires assistance to turn and stop.  _2_ 6. STEP OVER OBSTACLE Instructions: Begin walking at your normal speed. When you come to the shoe box, step over it, not around it, and keep walking. Grading: [place] the highest category that applies. (3) Normal-Is able to step over 2 stacked shoe boxes taped together (22.86 cm [9 in] total height) without changing gait speed; no evidence of imbalance. (2) Mild impairment-Is able to step over one shoe  box (11.43 cm [4.5 in] total height) without changing gait speed; no evidence of imbalance. (1) Moderate impairment-Is able to step over one shoe box (11.43 cm [4.5 in] total height) but must slow down and adjust steps to clear box safely. May require verbal cueing. (0) Severe impairment-Cannot perform without assistance.  _0_ 7. GAIT  WITH NARROW BASE OF SUPPORT Instructions: Walk on the floor with arms folded across the chest, feet aligned heel to toe in tandem for a distance of 3.6 m [12 ft]. The number of steps taken in a straight line are counted for a maximum of 10 steps. Grading: [place] the highest category that applies. (3) Normal-Is able to ambulate for 10 steps heel to toe with no staggering. (2) Mild impairment-Ambulates 7-9 steps. (1) Moderate impairment-Ambulates 4-7 steps. (0) Severe impairment-Ambulates less than 4 steps heel to toe or cannot perform without assistance.  _2_ 8. GAIT WITH EYES CLOSED Instructions: Walk at your normal speed from here to the next mark (6 m [20 ft]) with your eyes closed. Grading: [place] the highest category that applies. (3) Normal-Walks 6 m (20 ft), no assistive devices, good speed, no evidence of imbalance, normal gait pattern, deviates no more than 15.24 cm (6 in) outside 30.48-cm (12-in) walkway width. Ambulates 6 m (20 ft) in less than 7 seconds. (2) Mild impairment-Walks 6 m (20 ft), uses assistive device, slower speed, mild gait deviations, deviates 15.24-25.4 cm (6-10 in) outside 30.48-cm (12-in) walkway width. Ambulates 6 m (20 ft) in less than 9 seconds but greater than 7 seconds. (1) Moderate impairment-Walks 6 m (20 ft), slow speed, abnormal gait pattern, evidence for imbalance, deviates 25.4-38.1 cm (10-15 in) outside 30.48-cm (12-in) walkway width. Requires more than 9 seconds to ambulate 6 m (20 ft). (0) Severe impairment-Cannot walk 6 m (20 ft) without assistance, severe gait deviations or imbalance, deviates greater than 38.1 cm (15 in) outside 30.48-cm (12-in) walkway width or will not attempt task.  _2_ 9. AMBULATING BACKWARDS Instructions: Walk backwards until I tell you to stop. Grading: [place] the highest category that applies. (3) Normal-Walks 6 m (20 ft), no assistive devices, good speed, no evidence for imbalance, normal gait pattern, deviates no more than 15.24 cm  (6 in) outside 30.48-cm (12-in) walkway width. (2) Mild impairment-Walks 6 m (20 ft), uses assistive device, slower speed, mild gait deviations, deviates 15.24-25.4 cm (6-10 in) outside 30.48-cm (12-in) walkway width. (1) Moderate impairment-Walks 6 m (20 ft), slow speed, abnormal gait pattern, evidence for imbalance, deviates 25.4-38.1 cm (10-15 in) outside 30.48-cm (12-in) walkway width. (0) Severe impairment-Cannot walk 6 m (20 ft) without assistance, severe gait deviations or imbalance, deviates greater than 38.1 cm (15 in) outside 30.48-cm (12-in) walkway width or will not attempt task.  _2_ 10. STEPS Instructions: Walk up these stairs as you would at home (ie, using the rail if necessary). At the top turn around and walk down. Grading: [place] the highest category that applies. (3) Normal-Alternating feet, no rail. (2) Mild impairment-Alternating feet, must use rail. (1) Moderate impairment-Two feet to a stair; must use rail. (0) Severe impairment-Cannot do safely.  TOTAL SCORE: ___22___ /30 (MAXIMUM SCORE=30)  Scores of ? 22/30 on the FGA were found to be effective in predicting falls, Sensitivity 85%, Specificity 86% Scores of ? 20/30 on the FGA were optimal to predict older adults residing in community dwellings who would sustain unexplained falls in the next 6 months, Sensitivity 100%, Specificity 76% ([place], 2010; aged 36 to 49, Older Adults) [place]:  4.2 points for CVA Augustin Coupe et al, 2010) [facility]: 8 points for Balance and Vestibular Disorders Marjorie Smolder and Augustin Coupe, 2014)      Therapy Documentation Precautions:  Precautions Precautions: Fall Restrictions Weight Bearing Restrictions: No   See Function Navigator for Current Functional Status.   Therapy/Group: Individual Therapy  Corliss Skains 01/29/2018, 3:37 PM

## 2018-01-29 NOTE — Progress Notes (Signed)
Speech Language Pathology Daily Session Note  Patient Details  Name: Christian Maxwell MRN: [id number] Date of Birth: 05-08-1933  Today's Date: 01/29/2018 SLP Individual Time: 1000-1055 SLP Individual Time Calculation (min): 55 min  Short Term Goals: Week 1: SLP Short Term Goal 1 (Week 1): STG=LTG due to ELOS   Skilled Therapeutic Interventions:  Pt was seen for skilled ST targeting cognitive goals.  Pt was in bed upon arrival, requesting to use the bathroom.  Pt donned socks and slip on shoes with set up to obtain items before ambulating to the bathroom.  Pt was continent of bowel and bladder while seated on commode.  SLP facilitated the session with a scavenger hunt task to address functional problem solving and recall goals.  Pt needed supervision cues for working memory to recall the location of items from the list that he'd already passed while looking for other items.  Pt could recall route to his room after finding all targeted items with mod I.  Pt was left in bed with bed alarm set and call bell within reach.  Continue per current plan of care.    Function:  Eating Eating                 Cognition Comprehension Comprehension assist level: Follows basic conversation/direction with extra time/assistive device  Expression   Expression assist level: Expresses basic needs/ideas: With extra time/assistive device  Social Interaction Social Interaction assist level: Interacts appropriately 90% of the time - Needs monitoring or encouragement for participation or interaction.  Problem Solving Problem solving assist level: Solves basic 90% of the time/requires cueing < 10% of the time  Memory Memory assist level: Recognizes or recalls 75 - 89% of the time/requires cueing 10 - 24% of the time    Pain Pain Assessment Pain Scale: 0-10 Pain Score: 0-No pain   Therapy/Group: Individual Therapy  Twanda Stakes, Selinda Orion 01/29/2018, 3:08 PM

## 2018-01-30 ENCOUNTER — Inpatient Hospital Stay (HOSPITAL_COMMUNITY): Payer: Medicare Other

## 2018-01-30 ENCOUNTER — Inpatient Hospital Stay (HOSPITAL_COMMUNITY): Payer: Medicare Other | Admitting: Speech Pathology

## 2018-01-30 ENCOUNTER — Inpatient Hospital Stay (HOSPITAL_COMMUNITY): Payer: Medicare Other | Admitting: Occupational Therapy

## 2018-01-30 ENCOUNTER — Inpatient Hospital Stay (HOSPITAL_COMMUNITY): Payer: Medicare Other | Admitting: Physical Therapy

## 2018-01-30 NOTE — Progress Notes (Signed)
Occupational Therapy Discharge Summary  Patient Details  Name: Christian Maxwell MRN: [id number] Date of Birth: 1932/11/03   Patient has met 68 of 10 long term goals due to improved activity tolerance, improved balance, postural control and improved coordination.  Patient to discharge at overall Supervision level 2/2 cognitive deficits due to dementia.  Patient's family is hiring 24hr supervision for d/c needs. Pt's daughter has been present intermittently throughout rehab stay and is aware of pt's current level of physical and cognitive function. Extensive education provided regarding d/c recommendations and home modifications from which pt could benefit in order to increase safety. Pt with poor new learning abilities and difficulty breaking from standard home routine therefore is not receptive to DME and modified methods suggested by OT.  Recommendation:  Patient will benefit from ongoing skilled OT services in home health setting to continue to advance functional skills in the area of BADL, iADL and Reduce care partner burden.  Equipment: BSC and RW (per daughter's request)  Reasons for discharge: treatment goals met and discharge from hospital  Patient/family agrees with progress made and goals achieved: Yes  OT Discharge Precautions/Restrictions  Precautions Precautions: Fall Restrictions Weight Bearing Restrictions: No Vision Baseline Vision/History: Wears glasses Wears Glasses: At all times Patient Visual Report: No change from baseline Vision Assessment?: No apparent visual deficits Perception  Perception: Within Functional Limits Praxis Praxis: Intact Cognition Overall Cognitive Status: History of cognitive impairments - at baseline Arousal/Alertness: Awake/alert Orientation Level: Oriented X4 Memory: Impaired Memory Impairment: Retrieval deficit;Decreased recall of new information Awareness: Impaired Awareness Impairment: Emergent impairment Problem Solving  Impairment: Verbal complex;Functional complex Behaviors: Impulsive;Poor frustration tolerance Safety/Judgment: Appears intact Comments: Decreased awareness of deficits and functional implications Sensation Sensation Light Touch: Appears Intact Coordination Gross Motor Movements are Fluid and Coordinated: Yes Fine Motor Movements are Fluid and Coordinated: Yes Motor  Motor Motor: Hemiplegia Motor - Discharge Observations: Minimal R hemiparesis Trunk/Postural Assessment  Cervical Assessment Cervical Assessment: Exceptions to WFL(Forward head) Thoracic Assessment Thoracic Assessment: Exceptions to WFL(Rounded Shoulders) Lumbar Assessment Lumbar Assessment: Exceptions to WFL(Posterior pelvic tilt) Postural Control Postural Control: Within Functional Limits  Balance Balance Balance Assessed: Yes Dynamic Sitting Balance Dynamic Sitting - Balance Support: No upper extremity supported Dynamic Sitting - Level of Assistance: 6: Modified independent (Device/Increase time) Dynamic Standing Balance Dynamic Standing - Balance Support: No upper extremity supported;During functional activity Dynamic Standing - Level of Assistance: 6: Modified independent (Device/Increase time);5: Stand by assistance Extremity/Trunk Assessment RUE Assessment RUE Assessment: Within Functional Limits Active Range of Motion (AROM) Comments: generlized weakness and decreased coordination; AROM WFL but pt report limited by arthritis PTA LUE Assessment LUE Assessment: Within Functional Limits   See Function Navigator for Current Functional Status.  [name] L 01/30/2018, 7:40 AM

## 2018-01-30 NOTE — Progress Notes (Signed)
[facility] PHYSICAL MEDICINE & REHABILITATION     PROGRESS NOTE  Subjective/Complaints:  Patient seen laying in bed this morning.  He states he slept well at night.  He states he wants to go home.  ROS: Denies CP, SOB, nausea, vomiting diarrhea.  Objective: Vital Signs: Blood pressure 122/71, pulse 69, temperature (!) 97.5 F (36.4 C), temperature source Oral, resp. rate 18, height 5\' 9"  (1.753 m), weight 77 kg, SpO2 95 %. No results found. No results for input(s): WBC, HGB, HCT, PLT in the last 72 hours. Recent Labs    01/28/18 0837  NA 142  K 3.6  CL 103  GLUCOSE 159*  BUN 17  CREATININE 0.94  CALCIUM 9.9   CBG (last 3)  No results for input(s): GLUCAP in the last 72 hours.  Wt Readings from Last 3 Encounters:  01/30/18 77 kg  01/23/18 79.7 kg  10/03/16 84.4 kg    Physical Exam:  BP 122/71 (BP Location: Left Arm)   Pulse 69   Temp (!) 97.5 F (36.4 C) (Oral)   Resp 18   Ht 5\' 9"  (1.753 m)   Wt 77 kg   SpO2 95%   BMI 25.07 kg/m  Constitutional: He appears well-developed and well-nourished.  HENT: Normocephalic and atraumatic.  Eyes: EOM are normal. No discharge.  Cardiovascular: Irregularly irregular.  No JVD.  Respiratory: Effort normal and breath sounds normal.  GI: Bowel sounds are normal. He exhibits no distension.  Musculoskeletal: No edema or tenderness in extremities  Neurological: He is alert.  Follows simple commands.   Motor: Right upper extremity/right lower extremity 4-4+/5 proximal distal, stable  Left upper extremity: 4/5 proximal to distal Left lower extremity: 4/5 proximal to distal Skin: Skin is warm and dry.  Psychiatric: His affect is flat.  Assessment/Plan: 1. Functional deficits secondary to left thalamic hemorrhage which require 3+ hours per day of interdisciplinary therapy in a comprehensive inpatient rehab setting. Physiatrist is providing close team supervision and 24 hour management of active medical problems listed  below. Physiatrist and rehab team continue to assess barriers to discharge/monitor patient progress toward functional and medical goals.  Function:  Bathing Bathing position   Position: Shower  Bathing parts Body parts bathed by patient: Right arm, Left arm, Chest, Abdomen, Front perineal area, Buttocks, Right upper leg, Left upper leg, Right lower leg, Left lower leg, Back    Bathing assist Assist Level: Supervision or verbal cues      Upper Body Dressing/Undressing Upper body dressing   What is the patient wearing?: Pull over shirt/dress     Pull over shirt/dress - Perfomed by patient: Thread/unthread right sleeve, Thread/unthread left sleeve, Pull shirt over trunk, Put head through opening Pull over shirt/dress - Perfomed by helper: Put head through opening        Upper body assist Assist Level: Set up   Set up : To obtain clothing/put away  Lower Body Dressing/Undressing Lower body dressing   What is the patient wearing?: Pants, Socks, Shoes, Underwear Underwear - Performed by patient: Thread/unthread right underwear leg, Thread/unthread left underwear leg, Pull underwear up/down   Pants- Performed by patient: Thread/unthread right pants leg, Thread/unthread left pants leg, Pull pants up/down     Non-skid slipper socks- Performed by helper: Don/doff right sock, Don/doff left sock Socks - Performed by patient: Don/doff right sock, Don/doff left sock   Shoes - Performed by patient: Don/doff right shoe, Don/doff left shoe  Lower body assist Assist for lower body dressing: Supervision or verbal cues      Toileting Toileting Toileting activity did not occur: (wearing gown only) Toileting steps completed by patient: Performs perineal hygiene, Adjust clothing prior to toileting, Adjust clothing after toileting Toileting steps completed by helper: Performs perineal hygiene, Adjust clothing after toileting(per Linden Dolin, NT) Toileting Assistive Devices:  Grab bar or rail  Toileting assist Assist level: Supervision or verbal cues   Transfers Chair/bed transfer   Chair/bed transfer method: Ambulatory Chair/bed transfer assist level: Supervision or verbal cues Chair/bed transfer assistive device: Armrests     Locomotion Ambulation     Max distance: 500 Assist level: Supervision or verbal cues   Wheelchair Wheelchair activity did not occur: N/A        Cognition Comprehension Comprehension assist level: Follows basic conversation/direction with extra time/assistive device  Expression Expression assist level: Expresses basic needs/ideas: With extra time/assistive device  Social Interaction Social Interaction assist level: Interacts appropriately 90% of the time - Needs monitoring or encouragement for participation or interaction.  Problem Solving Problem solving assist level: Solves basic 90% of the time/requires cueing < 10% of the time  Memory Memory assist level: Recognizes or recalls 75 - 89% of the time/requires cueing 10 - 24% of the time    Medical Problem List and Plan: 1.  Gait disorder with history of dementia secondary to left thalamic hemorrhage secondary to small vessel disease versus Coumadin associated coagulopathy source  Continue CIR  Team conference today to discuss current and goals and coordination of care, home and environmental barriers, and discharge planning with nursing, case manager, and therapies.  2.  DVT Prophylaxis/Anticoagulation: SCDs.  Monitor for any bleeding episodes 3. Pain Management: Tylenol as needed 4. Mood: Aricept 10 mg daily, Xanax 0.25 mg twice daily as needed 5. Neuropsych: This patient is ?not fully capable of making decisions on his own behalf. 6. Skin/Wound Care: Routine skin checks 7. Fluids/Electrolytes/Nutrition: Routine in and outs   BMP within acceptable range on 9/12  Good oral intake for the most part 8.  Chronic atrial fibrillation.COUMADIN ON HOLD due to thalamic  hemorrhage.PLAN REPEAT CT HEAD IN  2 WEEKS (~9/25) and resume anticoagulation and antiplatelet if ICH resolves. 9.  Hypertension.  Lopressor 25 mg twice daily  Controlled on 9/18 10.  Hyperlipidemia.  Lipitor 11. Prediabetes  Labile on 9/16 12. Transaminitis: Resolved  Cont to monitor 13. SOB: Improved  DG reviewed, suggesting atelectasis  Wean supplemental O2 as tolerated 14. Sleep disturbance  Continue meds   Melatonin started on 9/14  Improved 15.  Chest pain: Resolved  Improved with nitro  ECG reviewed, atrial fibrillation with?  Anterior lead abnormalities  Vascular lower extremity ultrasound, negative  Troponin negative  Chest x-ray reviewed, relatively unremarkable  Cardiology consulted, per nursing, per discussion with Cards, no changes in management. Discuss again.  LOS (Days) 7 A FACE TO FACE EVALUATION WAS PERFORMED  Christian Maxwell 01/30/2018 9:37 AM

## 2018-01-30 NOTE — Progress Notes (Signed)
Occupational Therapy Session Note  Patient Details  Name: Christian Maxwell MRN: [id number] Date of Birth: 1932/12/27  Today's Date: 01/30/2018 OT Individual Time: 5397-6734 OT Individual Time Calculation (min): 60 min    Short Term Goals: Week 1:  OT Short Term Goal 1 (Week 1): n/a d/t ELOS  Skilled Therapeutic Interventions/Progress Updates:    Pt seen for OT ADL bathing/dressing and for family education with pt's daughter. Pt in supine upon arrival with daughter present . Pt required encouragement for participation, but willing to bathe at shower level. He completed ambulation throughout session with distant supervision, no AD. He gathered clothing items and needed supplies in prep for shower. Pt bathed standing in shower per daughter's request in order to assess pt's safety and balance with standing bathing task as pt's daughter believes this is what he will do at d/c despite therapist's recommendation to complete bathing from seated position. Pt required VCs to initiate use of grab bars in shower for increased safety as pt attempting to brace self on flat wall. Pt's daughter reports plans to install grab bars in home bathroom. Pt displays good functional standing balance as pt standing on one LE in order to wash feet. Although pt would be safer with shower chair, pt does display enough functional balance to complete bathing task from standing position with supervision. He returned to standard chair to dress at distant supervision-mod I level. Grooming tasks completed standing at sink mod I.  Throughout session, extensive education provided to pt's daughter regarding CLOF, DME, grab bars in bathroom shower and set-up, use of BSC, hand held urinal for night time use for increased safety (pt reports he will walk to bathroom despite recommendations), home set-up modifications to decrease fall risk hazards, what to do in case of fall, continuum of care, and d/c planning.   Therapy  Documentation Precautions:  Precautions Precautions: Fall Restrictions Weight Bearing Restrictions: No Pain:   No/denies pain  See Function Navigator for Current Functional Status.   Therapy/Group: Individual Therapy  [name] L 01/30/2018, 7:28 AM

## 2018-01-30 NOTE — Discharge Summary (Signed)
Discharge summary job # [id number]

## 2018-01-30 NOTE — Progress Notes (Signed)
Social Work Patient ID: Christian Maxwell, male   DOB: [birth year], 84 y.o.   MRN: [id number]  Met with pt and daughter-Natalie to discuss discharge tomorrow and the plans. She has hired aides for Friday to start and she will plan to stay with him on Thursday. Discussed follow up therapies will start off with home health and then transition to OP. Have ordered equipment-RW and 3 in 1. Both pleased with the plan and pt more than ready to go home tomorrow.

## 2018-01-30 NOTE — Discharge Summary (Signed)
NAMESHMUEL, GIRGIS MEDICAL RECORD PF:[id number] ACCOUNT [id number] DATE OF BIRTH:July 01, 1932 FACILITY: [facility] LOCATION: [facility] PHYSICIAN:ANKIT PATEL, MD  DISCHARGE SUMMARY  DATE OF DISCHARGE:  01/31/2018  ADMIT DATE:  01/23/2018  DISCHARGE DATE:  01/31/2018  DISCHARGE DIAGNOSES: 1.  Left thalamic hemorrhage secondary to small vessel disease versus Coumadin associated coagulopathy source. 2.  Sequential compression devices for deep venous thrombosis prophylaxis.   3.  Mood. 4.  Chronic atrial fibrillation. 5.  Hypertension. 6.  Hyperlipidemia. 7.  Sleep disturbance.  HOSPITAL COURSE:  This is an 84 year old right-handed male with history of dementia, maintained on Aricept.  Chronic atrial fibrillation, on Coumadin.  Hypertension.  He lives alone.  Reported to be independent, driving short distances, 2 level home.   Family in the vicinity.  Reported to have had 2 falls in the past couple of months.  Presented 01/21/2018 with nonspecific chest pain, dizziness, generalized weakness.  CT of the head showed left thalamic hemorrhage per report:  MRI 7 mm acute left  thalamic hemorrhage.  No underlying ischemic infarct or mass lesion.  Echocardiogram with ejection fraction of 65%, no wall motion abnormalities.  Troponin 0.06.  INR 2.02.  Coumadin held due to bleed.  Carotid Dopplers with no ICA stenosis.  Cardiology  followup for mildly elevated troponin felt to be nonspecific.  I did not feel to be acute coronary syndrome and monitored.  In regards to anticoagulation, neurology recommends hold anticoagulation due to [place].  Repeat CT scan 02/06/2018 and if ICH  resolved, consider resuming anticoagulation therapy in regards to Coumadin versus Eliquis.  Tolerating a regular diet.  The patient was admitted for comprehensive rehabilitation program.  PAST MEDICAL HISTORY:  See discharge diagnoses.  SOCIAL HISTORY:  Lives alone.  He does have family in the area.  FUNCTIONAL STATUS:  Upon  admission to rehab services was minimal guard 15 feet rolling walker, minimal assist sit to stand, min mod assist with activities of daily living.  PHYSICAL EXAMINATION: VITAL SIGNS:  Blood pressure 104/93, pulse 80, temperature 97, respirations 18. GENERAL:  Alert male.  He was able to provide his name and age.  Limited medical historian as well as geographical information. HEENT:  EOMs intact. NECK:  Supple, nontender, no JVD. CARDIOVASCULAR:  Irregularly irregular. ABDOMEN:  Soft, nontender, good bowel sounds. LUNGS:  Clear to auscultation without wheeze. NEUROLOGIC:  Manual muscle strength 4 to 4+ out of 5.    REHABILITATION HOSPITAL COURSE:  The patient was admitted to inpatient rehabilitation services.  Therapies initiated on a 3-hour daily basis, consisting of physical therapy, occupational therapy, speech therapy and rehabilitation nursing.  The following  issues were addressed during patient's rehabilitation stay.  Pertaining to the patient's left thalamic hemorrhage, remained stable.  He would follow up with neurology services.  Plan was to repeat CT scan approximately 02/06/2018.  Resume anticoagulation  if ICH resolved in regards to Coumadin versus Eliquis.  The patient was noted to be a fall risk.  He will continue with SCDs for DVT prophylaxis.  Noted history of dementia.  He remained on Aricept.  He needed some encouragement at times to participate  with therapies as he lacked direction and safety awareness.  Blood pressures, heart rate controlled with Lopressor.  He remained on Lipitor for hyperlipidemia.  The patient received weekly collaborative interdisciplinary team conferences to discuss estimated length of stay, family teaching, any barriers to discharge.  The patient was showing improved activity tolerance, improved balance as well as improved  postural control, ability to  compensate for these deficits.  Again, he needed ongoing 24-hour supervision for safety, which was  directed to the family.  He ambulates indoors and outdoors with no assistive device and close supervision, minimal guard on  unlevel surfaces.  He can gather belongings for activities of daily living and homemaking again stressing the need for 24-hour supervision for patient's safety.  Full family teaching was completed and plan was discharged to home.  DISCHARGE MEDICATIONS:  Included Lipitor 40 mg p.o. daily, Aricept 10 mg p.o. at bedtime, folic acid 1 mg p.o. daily, melatonin 1.5 mg at bedtime, metoprolol 25 mg p.o. b.i.d., Lovaza 1 gram daily, Protonix 40 mg p.o. daily, Xanax 0.25 mg b.i.d. as  needed.    DIET:  His diet was regular.  FOLLOWUP:  The patient would follow up with Dr. Delice Lesch at the outpatient rehab service office as directed; Dr. Ria Bush medical management; Dr. Mertie Moores of cardiology service; Dr. Erlinda Hong of [facility] Neurology services.  SPECIAL INSTRUCTIONS:  No driving, 94-RDEY supervision for safety.  No Coumadin therapy.  Follow up cranial CT scan 02/06/2018 for followup of left thalamic hemorrhage to enable plan to resume anticoagulation if hemorrhage resolves.  TN/NUANCE D:01/30/2018 T:01/30/2018 JOB:[id number]

## 2018-01-30 NOTE — Progress Notes (Signed)
Occupational Therapy Session Note  Patient Details  Name: Christian Maxwell MRN: [id number] Date of Birth: 1932-10-04  Today's Date: 01/30/2018 OT Individual Time: 1400-1430 OT Individual Time Calculation (min): 30 min    Short Term Goals: Week 1:  OT Short Term Goal 1 (Week 1): n/a d/t ELOS  Skilled Therapeutic Interventions/Progress Updates:    Pt resting in bed upon arrival and agreeable to participating in therapy. Pt mod I in room.  Pt amb without AD to ADL apartment.  Pt navigated to bathroom from sofa with multiple obstacles placed in path.  Pt navigated through room without no LOB or unsafe behaviors.  Pt retrieved towels from floor and placed in laundry basket.  Pt transported basket to gym and placed towels in dirty linen bag before returning laundry basket to ADL apartment.  Pt returned to room and returned to bed with all needs within reach. No unsafe behaviors noted.  No LOB during session.   Therapy Documentation Precautions:  Precautions Precautions: Fall Restrictions Weight Bearing Restrictions: No Pain: Pt denies pain See Function Navigator for Current Functional Status.   Therapy/Group: Individual Therapy  Leroy Libman 01/30/2018, 2:40 PM

## 2018-01-30 NOTE — Patient Care Conference (Signed)
Inpatient RehabilitationTeam Conference and Plan of Care Update Date: 01/30/2018   Time: 2:10 PM    Patient Name: Christian Maxwell      Medical Record Number: [id number]  Date of Birth: 26-Feb-1933 Sex: Male         Room/Bed: 4M09C/4M09C-01 Payor Info: Payor: MEDICARE / Plan: MEDICARE PART A AND B / Product Type: *No Product type* /    Admitting Diagnosis: L thalamic Hemorrhage  Admit Date/Time:  01/23/2018  6:24 PM Admission Comments: No comment available   Primary Diagnosis:  <principal problem not specified> Principal Problem: <principal problem not specified>  Patient Active Problem List   Diagnosis Date Noted  . Benign essential HTN   . Sleep disturbance   . SOB (shortness of breath)   . Transaminitis   . Prediabetes   . Thalamic hemorrhage ([place]) 01/23/2018  . Cerebral hemorrhage ([place])   . Essential hypertension   . Dyslipidemia   . Dementia without behavioral disturbance 01/22/2018  . Ataxia 01/21/2018  . Bleeding in brain ([place]) 01/21/2018  . Hypoxia 01/21/2018  . Chest pain 01/20/2018  . Hearing impairment 11/11/2015  . Olecranon bursitis of left elbow 11/11/2015  . LBBB (left bundle branch block) 05/27/2015  . Senile purpura ([place]) 02/22/2015  . Wound, open, arm, forearm 02/22/2015  . Advanced care planning/counseling discussion 11/06/2014  . Medicare annual wellness visit, initial 11/06/2014  . Bilateral knee pain 03/17/2014  . DOE (dyspnea on exertion) 02/16/2014  . Encounter for therapeutic drug monitoring 06/17/2013  . Memory impairment 03/06/2013  . MRSA colonization 12/12/2012  . Nevus of sternal region 12/04/2012  . Seasonal allergic rhinitis 10/22/2012  . Thyroid disease   . Suprapubic abscess 08/21/2012  . H/O colon cancer, stage I 06/24/2012  . Adenomatous colon polyp 06/24/2012  . Open wound-left arm and suprapubic area. 03/11/2012  . Chest discomfort 04/21/2011  . Hyperlipidemia 10/14/2010  . Atrial fibrillation ([place]) 08/08/2010  . INSOMNIA, MILD  12/12/2006    Expected Discharge Date: Expected Discharge Date: 01/31/18  Team Members Present: Physician leading conference: Dr. Delice Lesch Social Worker Present: Ovidio Kin, LCSW Nurse Present: Benjie Karvonen, RN PT Present: Kem Parkinson, PT OT Present: Napoleon Form, OT SLP Present: Windell Moulding, SLP PPS Coordinator present : Daiva Nakayama, RN, CRRN     Current Status/Progress Goal Weekly Team Focus  Medical   Gait disorder with history of dementia secondary to left thalamic hemorrhage secondary to small vessel disease versus Coumadin associated coagulopathy source  Improve mobility, safety, cognition  See above   Bowel/Bladder   continent of b/b LBM 9/17, on senna at HS, refuses at times,   maintain continence of b/b  timed toileting, maintain b/b q shift and prn   Swallow/Nutrition/ Hydration             ADL's   Supervision overall with min-mod cuing for safety awareness  Supervision overall  d/c planning, family education. Pt to d/c home tomorrow with 24hr supervision   Mobility   S overall no AD  S overall  dynamic balance, activity tolerance, anticipated d/c 9/19   Communication             Safety/Cognition/ Behavioral Observations  min assist-supervision   supervision   grad day for anticipated d/c of 9/19   Pain   occasional c/o pain, tylenol prn  <2  assess pain q shift and prn   Skin   bruising scattered, chest pain due to bruising, abrasion to bridge of nose  maintain skin integrity while  on IPR  assess skin q shift and prn      *See Care Plan and progress notes for long and short-term goals.     Barriers to Discharge  Current Status/Progress Possible Resolutions Date Resolved   Physician    Medical stability     See above  Therapies, optimize prediabetes meds, doing well, d/c tomorrow      Nursing                  PT                    OT                  SLP                SW                Discharge Planning/Teaching Needs:  HOme with  daughter to hire 24 hr aides to provide supervision level at home. Hopefully she has begun this process.      Team Discussion:  Reaching goals of mod/i-supervision. Will need supervision level at home for safety. Daughter has been through therapies and see's his needs for home, has hired assist. Medically stable and cardiology saw regarding CP normal. Made mod/i in room today.  Revisions to Treatment Plan:  DC 9/19    Continued Need for Acute Rehabilitation Level of Care: The patient requires daily medical management by a physician with specialized training in physical medicine and rehabilitation for the following conditions: Daily direction of a multidisciplinary physical rehabilitation program to ensure safe treatment while eliciting the highest outcome that is of practical value to the patient.: Yes Daily medical management of patient stability for increased activity during participation in an intensive rehabilitation regime.: Yes Daily analysis of laboratory values and/or radiology reports with any subsequent need for medication adjustment of medical intervention for : Neurological problems;Blood pressure problems;Other   I attest that I was present, lead the team conference, and concur with the assessment and plan of the team.   Elease Hashimoto 01/30/2018, 4:12 PM

## 2018-01-30 NOTE — Progress Notes (Signed)
Occupational Therapy Session Note  Patient Details  Name: EBERARDO DEMELLO MRN: [id number] Date of Birth: [birth year]  Today's Date: 01/30/2018 OT Individual Time: 2903-7955 OT Individual Time Calculation (min): 28 min    Short Term Goals: Week 1:  OT Short Term Goal 1 (Week 1): n/a d/t ELOS  Skilled Therapeutic Interventions/Progress Updates:    Pt received sitting up in bed agreeable to therapy with no c/o pain. Pt donned pants sitting with mod I. Pt completed 250 ft of functional mobility down to gift shop at mod I level. Pt edu re community re-integration and ways to stay active socially. Pt provided 1 vc during functional mobility around gift shop for navigating around crowded displays. Pt returned to room and left supine in bed with all needs met.  Therapy Documentation Precautions:  Precautions Precautions: Fall Restrictions Weight Bearing Restrictions: No General: General PT Missed Treatment Reason: Patient unwilling to participate Pain: Pain Assessment Pain Scale: 0-10 Pain Score: 0-No pain Pain Type: Acute pain Pain Location: Generalized Pain Descriptors / Indicators: Aching Pain Frequency: Intermittent Pain Onset: Gradual Pain Intervention(s): Medication (See eMAR)   Praxis Praxis: Intact  See Function Navigator for Current Functional Status.   Therapy/Group: Individual Therapy  Curtis Sites 01/30/2018, 1:34 PM

## 2018-01-30 NOTE — Progress Notes (Signed)
Physical Therapy Discharge Summary  Patient Details  Name: Christian Maxwell MRN: [id number] Date of Birth: 12-04-1932  Today's Date: 01/30/2018 PT Individual Time: 1015-1100 PT Individual Time Calculation (min): 45 min    Patient has met 10 of 10 long term goals due to improved activity tolerance, improved balance, improved postural control, ability to compensate for deficits, functional use of  right upper extremity and right lower extremity, improved attention and improved awareness.  Patient to discharge at an ambulatory level Supervision.   [name] has hired 24/7 S at d/c to provide assist d/t history of falls and baseline dementia.  Reasons goals not met: All goals met  Recommendation:  Patient will benefit from ongoing skilled PT services in home health setting to continue to advance safe functional mobility, address ongoing impairments in strength, balance, activity tolerance, and minimize fall risk.  Equipment: RW  Reasons for discharge: treatment goals met and discharge from hospital  Patient/family agrees with progress made and goals achieved: Yes  PT Discharge Precautions/Restrictions Precautions Precautions: Fall Restrictions Weight Bearing Restrictions: No Pain Pain Assessment Pain Scale: 0-10 Pain Score: 0-No pain Vision/Perception  Perception Perception: Within Functional Limits Praxis Praxis: Intact  Cognition Overall Cognitive Status: History of cognitive impairments - at baseline Arousal/Alertness: Awake/alert Orientation Level: Oriented X4 Memory: Impaired Memory Impairment: Retrieval deficit;Decreased recall of new information Safety/Judgment: Appears intact Sensation Sensation Light Touch: Appears Intact Coordination Gross Motor Movements are Fluid and Coordinated: Yes Fine Motor Movements are Fluid and Coordinated: Yes Motor  Motor Motor: Hemiplegia Motor - Discharge Observations: Minimal R hemiparesis  Mobility Bed Mobility Bed  Mobility: Sit to Supine;Supine to Sit Supine to Sit: Independent Sit to Supine: Independent Transfers Transfers: Sit to Stand;Stand Pivot Transfers Sit to Stand: Independent Stand to Sit: Independent Stand Pivot Transfers: Independent Locomotion  Gait Ambulation: Yes Gait Assistance: Supervision/Verbal cueing Gait Distance (Feet): 300 Feet Assistive device: None Gait Assistance Details: Verbal cues for precautions/safety Gait Gait: Yes Gait Pattern: Impaired Gait Pattern: Decreased stride length;Decreased trunk rotation;Lateral hip instability;Trendelenburg;Poor foot clearance - right Gait velocity: 2.72 ft/sec Stairs / Additional Locomotion Stairs: Yes Stair Management Technique: Two rails;Alternating pattern;Forwards Number of Stairs: 12 Height of Stairs: 6 Ramp: Supervision/Verbal cueing Curb: Supervision/Verbal cueing Wheelchair Mobility Wheelchair Mobility: No  Trunk/Postural Assessment  Cervical Assessment Cervical Assessment: Exceptions to WFL(mild forward head) Thoracic Assessment Thoracic Assessment: Within Functional Limits Lumbar Assessment Lumbar Assessment: Within Functional Limits Postural Control Postural Control: Within Functional Limits  Balance Balance Balance Assessed: Yes Standardized Balance Assessment Standardized Balance Assessment: Berg Balance Test;Timed Up and Go Test;Functional Gait Assessment Berg Balance Test Sit to Stand: Able to stand without using hands and stabilize independently Standing Unsupported: Able to stand safely 2 minutes Sitting with Back Unsupported but Feet Supported on Floor or Stool: Able to sit safely and securely 2 minutes Stand to Sit: Sits safely with minimal use of hands Transfers: Able to transfer safely, minor use of hands Standing Unsupported with Eyes Closed: Able to stand 10 seconds safely Standing Ubsupported with Feet Together: Able to place feet together independently and stand 1 minute safely From  Standing, Reach Forward with Outstretched Arm: Can reach forward >5 cm safely (2") From Standing Position, Pick up Object from Floor: Able to pick up shoe safely and easily From Standing Position, Turn to Look Behind Over each Shoulder: Looks behind from both sides and weight shifts well Turn 360 Degrees: Able to turn 360 degrees safely but slowly Standing Unsupported, Alternately Place Feet on Step/Stool: Able to stand  independently and safely and complete 8 steps in 20 seconds Standing Unsupported, One Foot in Front: Able to plae foot ahead of the other independently and hold 30 seconds Standing on One Leg: Able to lift leg independently and hold equal to or more than 3 seconds Total Score: 49 Timed Up and Go Test TUG: Normal TUG Normal TUG (seconds): 14 Dynamic Sitting Balance Dynamic Sitting - Balance Support: No upper extremity supported Dynamic Sitting - Level of Assistance: 6: Modified independent (Device/Increase time) Dynamic Standing Balance Dynamic Standing - Balance Support: No upper extremity supported;During functional activity Dynamic Standing - Level of Assistance: 6: Modified independent (Device/Increase time) Extremity Assessment  RUE Assessment RUE Assessment: Within Functional Limits Active Range of Motion (AROM) Comments: generlized weakness and decreased coordination; AROM WFL but pt report limited by arthritis PTA LUE Assessment LUE Assessment: Within Functional Limits RLE Assessment RLE Assessment: Exceptions to [facility] General Strength Comments: 4/5 hip flexion, 4+/5 to 5/5 throughout remaining LLE Assessment LLE Assessment: Exceptions to [facility] General Strength Comments: 4-/5 hip flexion, 4+/5 to 5/5 throughout remaining LE  Skilled Therapeutic Intervention: Pt received in bed, declines participation initially d/t [employer] show on tv, requests therapist return in 10 min. Upon therapist's return pt agreeable to treatment with encouragement. Reviewed LE  strengthening/balance HEP and provided handout for performance at home (sit <>stand, heel/toe raises, hip abduction, hamstring curls, tandem stance at counter, single leg stance at counter). Assessed mobility as above with S overall no AD. Performed nustep x8 min with BUE/BLE for strengthening and aerobic endurance on level 4 with average 50 steps/min. Returned to room with S gait. Pt with no further questions or concerns regarding d/c home with 24/7 S at this time. Remained in bed at end of session, all needs in reach.   See Function Navigator for Current Functional Status.  Benjiman Core Jhaniya Briski 01/30/2018, 11:08 AM

## 2018-01-30 NOTE — Progress Notes (Signed)
Speech Language Pathology Discharge Summary  Patient Details  Name: Christian Maxwell MRN: [id number] Date of Birth: [birth year]  Today's Date: 01/30/2018 SLP Individual Time: 0375-4360 SLP Individual Time Calculation (min): 18 min   Skilled Therapeutic Interventions:  Pt was seen for skilled ST targeting cognitive goals.  Pt is now cleared to be mod I in his room per PT/OT recommendations.  Pt ambulated to ST treatment room with no overt safety concerns noted.  SLP utilized the Dynavision to address working memory and problem solving goals.  Pt needed x1 verbal cue to recall and carry out therapist's specific recommendations regarding selectively identifying 1 out of 2 given targets but was otherwise mod I to complete task.  Pt struggled with dual tasking when using Dynavision due to what appeared to be slowed processing speed.  Pt needed x1 supervision verbal cue for route recall when ambulating back to his room from [place] treatment room.  Pt is ready for discharge tomorrow.       Patient has met 2 of 2 long term goals.  Patient to discharge at overall Supervision level.  Reasons goals not met:     Clinical Impression/Discharge Summary:   Pt has made small, functional gains while inpatient and is discharging having met 2 out of 2 short term goals.  Pt is currently supervision for tasks due to mild baseline cognitive deficits in the setting of dementia.  Pt/family education is complete at this time.  Pt is discharging home with recommendations for ST follow up at next level of care in addition to 24/7 supervision from paid caregiver.    Care Partner:  Caregiver Able to Provide Assistance: Yes  Type of Caregiver Assistance: Physical;Cognitive  Recommendation:  Home Health SLP;24 hour supervision/assistance  Rationale for SLP Follow Up: Maximize cognitive function and independence   Equipment: none recommended by SLP    Reasons for discharge: Discharged from hospital   Patient/Family Agrees  with Progress Made and Goals Achieved: Yes   Function:  Eating Eating                 Cognition Comprehension Comprehension assist level: Follows basic conversation/direction with extra time/assistive device  Expression   Expression assist level: Expresses basic needs/ideas: With extra time/assistive device  Social Interaction Social Interaction assist level: Interacts appropriately with others with medication or extra time (anti-anxiety, antidepressant).  Problem Solving Problem solving assist level: Solves basic 90% of the time/requires cueing < 10% of the time  Memory Memory assist level: Recognizes or recalls 75 - 89% of the time/requires cueing 10 - 24% of the time   Emilio Math 01/30/2018, 3:56 PM

## 2018-01-30 NOTE — Progress Notes (Signed)
Social Work  Elianie Hubers, Eliezer Champagne  Social Worker  Physical Medicine and Rehabilitation  Patient Care Conference  Cosign Needed  Date of Service:  01/30/2018  4:12 PM          Cosign Needed        Show:Clear all [x] Manual[x] Template[] Copied  Added by: [x] Lyonel Morejon, Gardiner Rhyme, LCSW  [] Hover for details Inpatient RehabilitationTeam Conference and Plan of Care Update Date: 01/30/2018   Time: 2:10 PM      Patient Name: Christian Maxwell      Medical Record Number: [id number]  Date of Birth: 1933-02-20 Sex: Male         Room/Bed: 4M09C/4M09C-01 Payor Info: Payor: MEDICARE / Plan: MEDICARE PART A AND B / Product Type: *No Product type* /     Admitting Diagnosis: L thalamic Hemorrhage  Admit Date/Time:  01/23/2018  6:24 PM Admission Comments: No comment available    Primary Diagnosis:  <principal problem not specified> Principal Problem: <principal problem not specified>       Patient Active Problem List    Diagnosis Date Noted  . Benign essential HTN    . Sleep disturbance    . SOB (shortness of breath)    . Transaminitis    . Prediabetes    . Thalamic hemorrhage ([place]) 01/23/2018  . Cerebral hemorrhage ([place])    . Essential hypertension    . Dyslipidemia    . Dementia without behavioral disturbance 01/22/2018  . Ataxia 01/21/2018  . Bleeding in brain ([place]) 01/21/2018  . Hypoxia 01/21/2018  . Chest pain 01/20/2018  . Hearing impairment 11/11/2015  . Olecranon bursitis of left elbow 11/11/2015  . LBBB (left bundle branch block) 05/27/2015  . Senile purpura ([place]) 02/22/2015  . Wound, open, arm, forearm 02/22/2015  . Advanced care planning/counseling discussion 11/06/2014  . Medicare annual wellness visit, initial 11/06/2014  . Bilateral knee pain 03/17/2014  . DOE (dyspnea on exertion) 02/16/2014  . Encounter for therapeutic drug monitoring 06/17/2013  . Memory impairment 03/06/2013  . MRSA colonization 12/12/2012  . Nevus of sternal region 12/04/2012  . Seasonal  allergic rhinitis 10/22/2012  . Thyroid disease    . Suprapubic abscess 08/21/2012  . H/O colon cancer, stage I 06/24/2012  . Adenomatous colon polyp 06/24/2012  . Open wound-left arm and suprapubic area. 03/11/2012  . Chest discomfort 04/21/2011  . Hyperlipidemia 10/14/2010  . Atrial fibrillation ([place]) 08/08/2010  . INSOMNIA, MILD 12/12/2006      Expected Discharge Date: Expected Discharge Date: 01/31/18   Team Members Present: Physician leading conference: Dr. Delice Lesch Social Worker Present: Ovidio Kin, LCSW Nurse Present: Benjie Karvonen, RN PT Present: Kem Parkinson, PT OT Present: Napoleon Form, OT SLP Present: Windell Moulding, SLP PPS Coordinator present : Daiva Nakayama, RN, CRRN       Current Status/Progress Goal Weekly Team Focus  Medical     Gait disorder with history of dementia secondary to left thalamic hemorrhage secondary to small vessel disease versus Coumadin associated coagulopathy source  Improve mobility, safety, cognition  See above   Bowel/Bladder     continent of b/b LBM 9/17, on senna at HS, refuses at times,   maintain continence of b/b  timed toileting, maintain b/b q shift and prn   Swallow/Nutrition/ Hydration               ADL's     Supervision overall with min-mod cuing for safety awareness  Supervision overall  d/c planning, family education. Pt to d/c home tomorrow with 24hr  supervision   Mobility     S overall no AD  S overall  dynamic balance, activity tolerance, anticipated d/c 9/19   Communication               Safety/Cognition/ Behavioral Observations   min assist-supervision   supervision   grad day for anticipated d/c of 9/19   Pain     occasional c/o pain, tylenol prn  <2  assess pain q shift and prn   Skin     bruising scattered, chest pain due to bruising, abrasion to bridge of nose  maintain skin integrity while on IPR  assess skin q shift and prn     *See Care Plan and progress notes for long and short-term goals.       Barriers to Discharge   Current Status/Progress Possible Resolutions Date Resolved   Physician     Medical stability     See above  Therapies, optimize prediabetes meds, doing well, d/c tomorrow      Nursing                 PT                    OT                 SLP            SW              Discharge Planning/Teaching Needs:  HOme with daughter to hire 24 hr aides to provide supervision level at home. Hopefully she has begun this process.      Team Discussion:  Reaching goals of mod/i-supervision. Will need supervision level at home for safety. Daughter has been through therapies and see's his needs for home, has hired assist. Medically stable and cardiology saw regarding CP normal. Made mod/i in room today.  Revisions to Treatment Plan:  DC 9/19    Continued Need for Acute Rehabilitation Level of Care: The patient requires daily medical management by a physician with specialized training in physical medicine and rehabilitation for the following conditions: Daily direction of a multidisciplinary physical rehabilitation program to ensure safe treatment while eliciting the highest outcome that is of practical value to the patient.: Yes Daily medical management of patient stability for increased activity during participation in an intensive rehabilitation regime.: Yes Daily analysis of laboratory values and/or radiology reports with any subsequent need for medication adjustment of medical intervention for : Neurological problems;Blood pressure problems;Other     I attest that I was present, lead the team conference, and concur with the assessment and plan of the team.     Elease Hashimoto 01/30/2018, 4:12 PM               Patient ID: Christian Maxwell, male   DOB: Feb 11, 1933, 84 y.o.   MRN: [id number]

## 2018-01-31 MED ORDER — FOLIC ACID 1 MG PO TABS: 1.0000 mg | ORAL_TABLET | Freq: Every day | ORAL | 0 refills | Status: DC

## 2018-01-31 MED ORDER — ZOLPIDEM TARTRATE 10 MG PO TABS: 10.0000 mg | ORAL_TABLET | Freq: Every evening | ORAL | 0 refills | Status: DC | PRN

## 2018-01-31 MED ORDER — ATORVASTATIN CALCIUM 40 MG PO TABS: 40.0000 mg | ORAL_TABLET | Freq: Every day | ORAL | 1 refills | Status: DC

## 2018-01-31 MED ORDER — ALPRAZOLAM 0.25 MG PO TABS: 0.2500 mg | ORAL_TABLET | Freq: Two times a day (BID) | ORAL | 0 refills | Status: DC | PRN

## 2018-01-31 MED ORDER — PANTOPRAZOLE SODIUM 40 MG PO TBEC: 40.0000 mg | DELAYED_RELEASE_TABLET | Freq: Every day | ORAL | 0 refills | Status: DC

## 2018-01-31 MED ORDER — METOPROLOL TARTRATE 25 MG PO TABS: 25.0000 mg | ORAL_TABLET | Freq: Two times a day (BID) | ORAL | 8 refills | Status: DC

## 2018-01-31 MED ORDER — SIMVASTATIN 40 MG PO TABS: 40.0000 mg | ORAL_TABLET | Freq: Every morning | ORAL | 0 refills | Status: DC

## 2018-01-31 MED ORDER — ACETAMINOPHEN 325 MG PO TABS: 650.0000 mg | ORAL_TABLET | ORAL | Status: DC | PRN

## 2018-01-31 MED ORDER — OMEGA-3 FATTY ACIDS 1000 MG PO CAPS: 1.0000 g | ORAL_CAPSULE | Freq: Every day | ORAL | 0 refills | Status: DC

## 2018-01-31 MED ORDER — DONEPEZIL HCL 10 MG PO TABS: 10.0000 mg | ORAL_TABLET | Freq: Every day | ORAL | 0 refills | Status: DC

## 2018-01-31 NOTE — Discharge Instructions (Signed)
Inpatient Rehab Discharge Instructions  [place] Discharge date and time: No discharge date for patient encounter.   Activities/Precautions/ Functional Status: Activity: activity as tolerated Diet: regular diet Wound Care: none needed Functional status:  ___ No restrictions     ___ Walk up steps independently ___ 24/7 supervision/assistance   ___ Walk up steps with assistance ___ Intermittent supervision/assistance  ___ Bathe/dress independently ___ Walk with walker     _x__ Bathe/dress with assistance ___ Walk Independently    ___ Shower independently ___ Walk with assistance    ___ Shower with assistance ___ No alcohol     ___ Return to work/school ________  Special Instructions: NO DRIVING  06-YOKH supervision and assistance for patient safety  No Coumadin therapy  Follow-up cranial CT scan 02/06/2018 at [facility] radiology department follow-up of left thalamic hemorrhage to enable plan to resume anticoagulation if hemorrhage resolving.  Call to confirm appointment   COMMUNITY REFERRALS UPON DISCHARGE:    Home Health:   PT, OT, [place]   Date of last service:01/31/2018  Medical Equipment/Items Ordered:ROLLING WALKER & 3 IN 1  Agency/Supplier:[employer] 24 HR SUPERVISION   GENERAL COMMUNITY RESOURCES FOR PATIENT/FAMILY: Support Groups:[facility] SUPPORT GROUP  THE SECOND Thursday @ 6:00-7:00 PM ON THE REHAB UNIT QUESTIONS CONTACT [phone]  My questions have been answered and I understand these instructions. I will adhere to these goals and the provided educational materials after my discharge from the hospital.  Patient/Caregiver Signature _______________________________ Date __________  Clinician Signature _______________________________________ Date __________  Please bring this form and your medication list with you to all your follow-up  doctor's appointments.

## 2018-01-31 NOTE — Progress Notes (Signed)
Social Work  Discharge Note  The overall goal for the admission was met for:   Discharge location: Yes-HOME WITH HIRED CAREGIVER-24 HR  Length of Stay: Yes-8 DAYS  Discharge activity level: Yes-SUPERVISION LEVEL  Home/community participation: Yes  Services provided included: MD, RD, PT, OT, SLP, RN, CM, TR, Pharmacy, Neuropsych and SW  Financial Services: Medicare and Private Insurance: [place]  Follow-up services arranged: Home Health: [facility] CARE-PT,OT,SP, DME: [place] and Patient/Family has no preference for HH/DME agencies  Comments (or additional information):DAUGHTER HAS ARRANGED 24 HR CARE VIA AIDE. PLANS TO STAY WITH TODAY UNTIL CAREGIVER IN PLACE FOR TOMORROW.  Patient/Family verbalized understanding of follow-up arrangements: Yes  Individual responsible for coordination of the follow-up plan: NATALIE-DAUGHTER  Confirmed correct DME delivered: Elease Hashimoto 01/31/2018    Elease Hashimoto

## 2018-01-31 NOTE — Progress Notes (Signed)
Patient discharged to home per wheelchair  accompanied by daughter and NT. Discharge instructions done by Dan PA no further questions noted.  

## 2018-01-31 NOTE — Plan of Care (Signed)
  Problem: Consults Goal: RH STROKE PATIENT EDUCATION Description See Patient Education module for education specifics  Outcome: Progressing   Problem: RH BLADDER ELIMINATION Goal: RH STG MANAGE BLADDER WITH ASSISTANCE Description STG Manage Bladder With mod I Assistance  Outcome: Progressing   Problem: RH SAFETY Goal: RH STG ADHERE TO SAFETY PRECAUTIONS W/ASSISTANCE/DEVICE Description STG Adhere to Safety Precautions With min Assistance/Device.  Outcome: Progressing   Problem: RH PAIN MANAGEMENT Goal: RH STG PAIN MANAGED AT OR BELOW PT'S PAIN GOAL Description Less than 3 out of 10  Outcome: Progressing  Pt independent/ with mod I  assist.

## 2018-02-01 ENCOUNTER — Telehealth: Payer: Self-pay | Admitting: *Deleted

## 2018-02-01 NOTE — Telephone Encounter (Signed)
Wells Guiles called to verify that Dr Posey Pronto would be the signing MD on [facility] orders. They reported there was no PCP. We did order OT/PT/ST services at discharge from inpt rehab, so approval given.  I told her that Christian Maxwell is listed as PCP.

## 2018-02-02 DIAGNOSIS — R7303 Prediabetes: Secondary | ICD-10-CM | POA: Diagnosis not present

## 2018-02-02 DIAGNOSIS — E785 Hyperlipidemia, unspecified: Secondary | ICD-10-CM | POA: Diagnosis not present

## 2018-02-02 DIAGNOSIS — R2689 Other abnormalities of gait and mobility: Secondary | ICD-10-CM | POA: Diagnosis not present

## 2018-02-02 DIAGNOSIS — I482 Chronic atrial fibrillation: Secondary | ICD-10-CM | POA: Diagnosis not present

## 2018-02-02 DIAGNOSIS — I69398 Other sequelae of cerebral infarction: Secondary | ICD-10-CM | POA: Diagnosis not present

## 2018-02-02 DIAGNOSIS — Z87891 Personal history of nicotine dependence: Secondary | ICD-10-CM | POA: Diagnosis not present

## 2018-02-02 DIAGNOSIS — I10 Essential (primary) hypertension: Secondary | ICD-10-CM | POA: Diagnosis not present

## 2018-02-02 DIAGNOSIS — Z85038 Personal history of other malignant neoplasm of large intestine: Secondary | ICD-10-CM | POA: Diagnosis not present

## 2018-02-04 ENCOUNTER — Telehealth: Payer: Self-pay

## 2018-02-04 ENCOUNTER — Inpatient Hospital Stay: Payer: Medicare Other | Admitting: Family Medicine

## 2018-02-04 NOTE — Telephone Encounter (Signed)
Transitional Care call-daughter Tammy    1. Are you/is patient experiencing any problems since coming home?No  Are there any questions regarding any aspect of care?No 2. Are there any questions regarding medications administration/dosing?YesAre meds being taken as prescribed?Yes Patient should review meds with caller to confirm 3. Have there been any falls?No 4. Has Home Health been to the house and/or have they contacted you?Yes If not, have you tried to contact them? Can we help you contact them? 5. Are bowels and bladder emptying properly?Yes Are there any unexpected incontinence issues? If applicable, is patient following bowel/bladder programs? 6. Any fevers, problems with breathing, unexpected pain?No 7. Are there any skin problems or new areas of breakdown?No 8. Has the patient/family member arranged specialty MD follow up (ie cardiology/neurology/renal/surgical/etc)?Yes  Can we help arrange? 9. Does the patient need any other services or support that we can help arrange?No 10. Are caregivers following through as expected in assisting the patient?YEs 11. Has the patient quit smoking, drinking alcohol, or using drugs as recommended?Yes Appointment time, arrive time 11:00am for 11:20 with Dr. Posey Pronto [address] [phone]

## 2018-02-05 ENCOUNTER — Telehealth: Payer: Self-pay | Admitting: Family Medicine

## 2018-02-05 ENCOUNTER — Telehealth: Payer: Self-pay

## 2018-02-05 NOTE — Telephone Encounter (Signed)
plz call Clair Gulling - ok to give this verbal. However - pt missed his hospital f/u visit yesterday and has not been seen since 2017. If we are to continue approving home health patient will need to schedule 30 min hosp f/u office visit otherwise we cannot continue approving home health.  Or does he have a new PCP?

## 2018-02-05 NOTE — Telephone Encounter (Signed)
Copied from [place] [id number]. Topic: Quick Communication - See Telephone Encounter >> Feb 05, 2018 10:45 AM Rutherford Nail, NT wrote: CRM for notification. See Telephone encounter for: 02/05/18. Christian Maxwell, a physical therapist with [facility], calling to request verbal order for the following: 1 additional visit for this week.   States that the patient has pending occupational and speech therapy evaluations (FYI) Patient has dementia issues that result in confusion about medications Currently, there is a 24/7 hired care giver CB#:[phone]

## 2018-02-05 NOTE — Telephone Encounter (Signed)
Left message on vm for Clair Gulling of [facility] notifying him Dr. Darnell Level gives verbal order for therapies requested.  Also, relayed Dr. Synthia Innocent message about pt needing to schedule 30 min hospital f/u.

## 2018-02-05 NOTE — Telephone Encounter (Signed)
[name], PT/ADVHC called requesting verbal orders for HHPT 1wk1, states pt is doing well. Approved orders per discharge summary.

## 2018-02-07 DIAGNOSIS — I10 Essential (primary) hypertension: Secondary | ICD-10-CM | POA: Diagnosis not present

## 2018-02-07 DIAGNOSIS — I69398 Other sequelae of cerebral infarction: Secondary | ICD-10-CM | POA: Diagnosis not present

## 2018-02-07 DIAGNOSIS — E785 Hyperlipidemia, unspecified: Secondary | ICD-10-CM | POA: Diagnosis not present

## 2018-02-07 DIAGNOSIS — R2689 Other abnormalities of gait and mobility: Secondary | ICD-10-CM | POA: Diagnosis not present

## 2018-02-07 DIAGNOSIS — I482 Chronic atrial fibrillation: Secondary | ICD-10-CM | POA: Diagnosis not present

## 2018-02-07 DIAGNOSIS — R7303 Prediabetes: Secondary | ICD-10-CM | POA: Diagnosis not present

## 2018-02-08 DIAGNOSIS — I10 Essential (primary) hypertension: Secondary | ICD-10-CM | POA: Diagnosis not present

## 2018-02-08 DIAGNOSIS — I482 Chronic atrial fibrillation: Secondary | ICD-10-CM | POA: Diagnosis not present

## 2018-02-08 DIAGNOSIS — R2689 Other abnormalities of gait and mobility: Secondary | ICD-10-CM | POA: Diagnosis not present

## 2018-02-08 DIAGNOSIS — E785 Hyperlipidemia, unspecified: Secondary | ICD-10-CM | POA: Diagnosis not present

## 2018-02-08 DIAGNOSIS — R7303 Prediabetes: Secondary | ICD-10-CM | POA: Diagnosis not present

## 2018-02-08 DIAGNOSIS — I69398 Other sequelae of cerebral infarction: Secondary | ICD-10-CM | POA: Diagnosis not present

## 2018-02-11 ENCOUNTER — Ambulatory Visit (INDEPENDENT_AMBULATORY_CARE_PROVIDER_SITE_OTHER): Payer: Medicare Other

## 2018-02-11 DIAGNOSIS — Z5181 Encounter for therapeutic drug level monitoring: Secondary | ICD-10-CM | POA: Diagnosis not present

## 2018-02-11 DIAGNOSIS — I482 Chronic atrial fibrillation, unspecified: Secondary | ICD-10-CM

## 2018-02-11 LAB — POCT INR: INR: 1.1 — AB (ref 2.0–3.0)

## 2018-02-11 NOTE — Patient Instructions (Signed)
Description   Pt is off Coumadin at present, continue to hold Coumadin.  CT of head scheduled for 02/12/18 will await results and instructions from pt's primary care MD if pt is to resume Coumadin if bleed resolved (per hospital d/c instructions.)  Call us to schedule follow-up appt on resumes Coumadin [phone]

## 2018-02-12 ENCOUNTER — Ambulatory Visit (HOSPITAL_COMMUNITY)
Admission: RE | Admit: 2018-02-12 | Discharge: 2018-02-12 | Disposition: A | Discharge status: Home / Self Care | Payer: Medicare Other | Source: Ambulatory Visit | Attending: Physician Assistant | Admitting: Physician Assistant

## 2018-02-12 DIAGNOSIS — I610 Nontraumatic intracerebral hemorrhage in hemisphere, subcortical: Secondary | ICD-10-CM | POA: Diagnosis not present

## 2018-02-12 DIAGNOSIS — G319 Degenerative disease of nervous system, unspecified: Secondary | ICD-10-CM | POA: Diagnosis not present

## 2018-02-12 DIAGNOSIS — I629 Nontraumatic intracranial hemorrhage, unspecified: Secondary | ICD-10-CM | POA: Diagnosis not present

## 2018-02-14 ENCOUNTER — Other Ambulatory Visit: Payer: Self-pay

## 2018-02-14 ENCOUNTER — Encounter: Payer: Medicare Other | Attending: Physical Medicine & Rehabilitation | Admitting: Physical Medicine & Rehabilitation

## 2018-02-14 ENCOUNTER — Encounter: Payer: Self-pay | Admitting: Physical Medicine & Rehabilitation

## 2018-02-14 VITALS — BP 113/76 | HR 91 | Ht 69.0 in | Wt 178.0 lb

## 2018-02-14 DIAGNOSIS — G301 Alzheimer's disease with late onset: Secondary | ICD-10-CM | POA: Diagnosis not present

## 2018-02-14 DIAGNOSIS — E079 Disorder of thyroid, unspecified: Secondary | ICD-10-CM | POA: Diagnosis not present

## 2018-02-14 DIAGNOSIS — F0390 Unspecified dementia without behavioral disturbance: Secondary | ICD-10-CM | POA: Insufficient documentation

## 2018-02-14 DIAGNOSIS — E785 Hyperlipidemia, unspecified: Secondary | ICD-10-CM | POA: Insufficient documentation

## 2018-02-14 DIAGNOSIS — Z7901 Long term (current) use of anticoagulants: Secondary | ICD-10-CM | POA: Insufficient documentation

## 2018-02-14 DIAGNOSIS — Z9049 Acquired absence of other specified parts of digestive tract: Secondary | ICD-10-CM | POA: Insufficient documentation

## 2018-02-14 DIAGNOSIS — F0280 Dementia in other diseases classified elsewhere without behavioral disturbance: Secondary | ICD-10-CM

## 2018-02-14 DIAGNOSIS — I482 Chronic atrial fibrillation, unspecified: Secondary | ICD-10-CM | POA: Insufficient documentation

## 2018-02-14 DIAGNOSIS — Z8249 Family history of ischemic heart disease and other diseases of the circulatory system: Secondary | ICD-10-CM | POA: Insufficient documentation

## 2018-02-14 DIAGNOSIS — I252 Old myocardial infarction: Secondary | ICD-10-CM | POA: Insufficient documentation

## 2018-02-14 DIAGNOSIS — I10 Essential (primary) hypertension: Secondary | ICD-10-CM | POA: Diagnosis not present

## 2018-02-14 DIAGNOSIS — Z85038 Personal history of other malignant neoplasm of large intestine: Secondary | ICD-10-CM | POA: Insufficient documentation

## 2018-02-14 DIAGNOSIS — Z87891 Personal history of nicotine dependence: Secondary | ICD-10-CM | POA: Insufficient documentation

## 2018-02-14 DIAGNOSIS — I610 Nontraumatic intracerebral hemorrhage in hemisphere, subcortical: Secondary | ICD-10-CM

## 2018-02-14 NOTE — Progress Notes (Signed)
Subjective:    Patient ID: Christian Maxwell, male    DOB: March 17, 1933, 84 y.o.   MRN: [id number]  HPI 84 year old right-handed male with history of dementia, maintained on Aricept. Chronic atrial fibrillation, on Coumadin presents for transitional care management after receiving CIR for left thalamic hemorrhage.   DATE OF DISCHARGE:  01/31/2018 ADMIT DATE:  01/23/2018  At discharge, he was instructed to follow up with PCP, with whom he has an appointment.  He does not recall if he has Neurology and Cards appointment. He currently has supervision.  He obtained CT scan. BP is controlled. Sleep has improved. Chest pain has resolved. Denies falls.  Mobility: No assistive device required Therapies: Outpatient 2/week DME:  Not required  Pain Inventory Average Pain 0 Pain Right Now 0 My pain is no pain  In the last 24 hours, has pain interfered with the following? General activity 0 Relation with others 0 Enjoyment of life 0 What TIME of day is your pain at its worst? no pain Sleep (in general) Good  Pain is worse with: no pain Pain improves with: no pain Relief from Meds: no meds  Mobility how many minutes can you walk? 10 ability to climb steps?  yes do you drive?  yes  Function retired  Neuro/Psych No problems in this area  Prior Studies CT/MRI  Physicians involved in your care Any changes since last visit?  no   Family History  Problem Relation Age of Onset  . Stroke Mother   . Stroke Father   . Hypertension Father   . CAD Father 40       MI  . Dementia Father   . Stroke Sister 35  . Stroke Brother   . Stroke Brother   . Diabetes Brother   . CAD Brother 38       MI  . Dementia Sister 89  . Dementia Brother   . Colon cancer Cousin        x4 total with colon cancer  . Cancer Neg Hx   . Colon polyps Neg Hx   . Rectal cancer Neg Hx   . Stomach cancer Neg Hx    Social History   Socioeconomic History  . Marital status: Widowed    Spouse name: Not on  file  . Number of children: Not on file  . Years of education: Not on file  . Highest education level: Not on file  Occupational History  . Not on file  Social Needs  . Financial resource strain: Not very hard  . Food insecurity:    Worry: Never true    Inability: Never true  . Transportation needs:    Medical: No    Non-medical: No  Tobacco Use  . Smoking status: Former Smoker    Last attempt to quit: 10/13/1977    Years since quitting: 40.3  . Smokeless tobacco: Former Systems developer    Types: [place] date: 10/13/1977  Substance and Sexual Activity  . Alcohol use: No    Alcohol/week: 0.0 standard drinks  . Drug use: No  . Sexual activity: Never  Lifestyle  . Physical activity:    Days per week: 2 days    Minutes per session: 30 min  . Stress: Not at all  Relationships  . Social connections:    Talks on phone: Twice a week    Gets together: Twice a week    Attends religious service: More than 4 times per year  Active member of club or organization: Yes    Attends meetings of clubs or organizations: Never    Relationship status: Widowed  Other Topics Concern  . Not on file  Social History Narrative   Caffeine: none   Lives alone.  Grown daughters x2   Occupation: retired, had Human resources officer   Activity: take care of 3 houses   Diet: fruits/vegetables daily, good water      Cards: Ecologist   [place]o: [place]   Surg: [name]   [facility]   Past Surgical History:  Procedure Laterality Date  . COLECTOMY  2000   6 inches removed  . COLONOSCOPY  2009   polyp, h/o cancer [name])  . EYE SURGERY     cataract surgery both eyes- pts ates he has never had cataract surgery 10-26-15  . HERNIA REPAIR  07/8099   DB umbilical hernia  . INCISE AND DRAIN ABCESS  2009,2010,2011,2012  . INCISION AND DRAINAGE Right 04/02/2013   Procedure: INCISION AND DRAINAGE RIGHT KNEE HEMATOMA;  Surgeon: [name], MD;  Location: [facility];  Service: Orthopedics;  Laterality: Right;  .  INCISION AND DRAINAGE ABSCESS N/A 09/20/2012   Procedure: INCISION AND DRAINAGE SUPRAPUBIC ABSCESS;  Surgeon: [name], MD;  Location: [place] [facility];  Service: General;  Laterality: N/A;  . INCISION AND DRAINAGE ABSCESS Left 02/28/2013   Procedure: INCISION AND DRAINAGE LEFT ARM ABSCESS;  Surgeon: [name], MD;  Location: [facility];  Service: General;  Laterality: Left;  . LUMBAR LAMINECTOMY  1977  . MASS EXCISION  03/07/2012   EXCISION MASS;  Surgeon: [name], MD;  Laterality: Right;  removal mass posterior right arm  . POLYPECTOMY    . WOUND EXPLORATION  2006   For possible stitch abscess Dr [name]   Past Medical History:  Diagnosis Date  . Abscess 7/13   abd abscess  . Cancer [facility])    colon cancer  . Chronic atrial fibrillation   . History of colon cancer 2000   T3N0 s/p colectomy  . History of echocardiogram    a. Echo 2/13:  Mild LVH, EF 55-60%, mild MR, moderate LAE, mild to moderate RAE;  b. Echo 1/17: mild LVH, vigorous LVF, EF 65-70%, no RWMA, trivial AI, trivial MR, severe LAE, mild RAE, mod TR, PASP 32 mmHg  . History of stress test    a. Cardiolite in 9/04: EF 67%, no ischemia.;  b. Myoview 6/16: no ischemia   . Hx of adenomatous colonic polyps   . Hyperlipidemia   . Hypertension   . METHICILLIN RESISTANT STAPHYLOCOCCUS AUREUS INFECTION 12/12/2006   Annotation: stitch abcess in lower abdomen after colon cancer resection Qualifier: Diagnosis of  By: [name] MD, [name]    . Myocardial infarction ([place]) 1985  . Postoperative stitch abscess 07/19/2011  . PVC's (premature ventricular contractions)   . Syncope and collapse    pt denies  . Thyroid disease    followed by [name]   BP 113/76   Pulse 91   Ht 5\' 9"  (1.753 m)   Wt 178 lb (80.7 kg)   SpO2 97%   BMI 26.29 kg/m   Opioid Risk Score:   Fall Risk Score:  `1  Depression screen PHQ 2/9  Depression screen [facility] 2/9 02/14/2018 11/11/2015 11/05/2015 11/06/2014 12/12/2012  Decreased  Interest 0 0 0 0 0  Down, Depressed, Hopeless 0 0 0 0 0  PHQ - 2 Score 0 0 0 0 0  Some recent data  might be hidden   Review of Systems  HENT: Positive for rhinorrhea.   Eyes: Negative.   Respiratory: Negative.   Cardiovascular: Negative.   Gastrointestinal: Negative.   Endocrine: Negative.   Genitourinary: Negative.   Musculoskeletal: Negative.   Skin: Negative.   Allergic/Immunologic: Negative.   Neurological: Negative.   Hematological: Negative.   Psychiatric/Behavioral: Negative.   All other systems reviewed and are negative.      Objective:   Physical Exam Constitutional: He appears well-developed and well-nourished.  HENT: Normocephalic and atraumatic.  Eyes: EOM are normal. No discharge.  Cardiovascular: Irregularly irregular. No JVD.  Respiratory: Effort normal and breath sounds normal.  GI: Bowel sounds are normal. He exhibits no distension.  Musculoskeletal: No edema or tenderness in extremities  Neurological: He is alert.  HOH Follows simple commands.   Motor: Right upper extremity/right lower extremity 5/5 proximal distal, stable  Left upper extremity: 5/5 proximal to distal Left lower extremity: 5/5 proximal to distal Skin: Skin is warm and dry.  Psychiatric: His affect is flat.    Assessment & Plan:  84 year old right-handed male with history of dementia, maintained on Aricept. Chronic atrial fibrillation, on Coumadin presents for transitional care management after receiving CIR for left thalamic hemorrhage.   1.  History of dementia with left thalamic hemorrhage secondary to small vessel disease versus Coumadin associated coagulopathy source             Begin therapies  Follow up with Neurology - pt to inquire if he has an appointment  2. Mood: Aricept 10 mg daily, Xanax 0.25 mg twice daily as needed  3  Chronic atrial fibrillation.  CT head reviewed, significantly improved   Follow up with Cards for resumption of coumadin +/- bridge give unknown  etiology of stroke  4.  Hypertension.    Cont meds  Controlled at present  Meds reviewed Referrals reviewed - may need Neurology and Cards appointment All questions answered

## 2018-02-15 ENCOUNTER — Ambulatory Visit (INDEPENDENT_AMBULATORY_CARE_PROVIDER_SITE_OTHER): Payer: Medicare Other | Admitting: Physician Assistant

## 2018-02-15 ENCOUNTER — Other Ambulatory Visit: Payer: Self-pay | Admitting: Cardiovascular Disease

## 2018-02-15 ENCOUNTER — Encounter: Payer: Self-pay | Admitting: Physician Assistant

## 2018-02-15 VITALS — BP 108/54 | HR 49 | Ht 69.0 in | Wt 178.0 lb

## 2018-02-15 DIAGNOSIS — E782 Mixed hyperlipidemia: Secondary | ICD-10-CM | POA: Diagnosis not present

## 2018-02-15 DIAGNOSIS — I482 Chronic atrial fibrillation, unspecified: Secondary | ICD-10-CM

## 2018-02-15 NOTE — Patient Instructions (Signed)
Medication Instructions:  Your physician recommends that you continue on your current medications as directed. Please refer to the Current Medication list given to you today.  If you need a refill on your cardiac medications before your next appointment, please call your pharmacy.   Lab work: None ordered  If you have labs (blood work) drawn today and your tests are completely normal, you will receive your results only by: [name] MyChart Message (if you have MyChart) OR . A paper copy in the mail If you have any lab test that is abnormal or we need to change your treatment, we will call you to review the results.  Testing/Procedures: None ordered  Follow-Up: At [facility], you and your health needs are our priority.  As part of our continuing mission to provide you with exceptional heart care, we have created designated Provider Care Teams.  These Care Teams include your primary Cardiologist (physician) and Advanced Practice Providers (APPs -  Physician Assistants and Nurse Practitioners) who all work together to provide you with the care you need, when you need it. You will need a follow up appointment in:  3 months.   You may see Mertie Moores, MD or one of the following Advanced Practice Providers on your designated Care Team: Richardson Dopp, PA-C [place], [place] . Daune Perch, NP  Any Other Special Instructions Will Be Listed Below (If Applicable).

## 2018-02-15 NOTE — Progress Notes (Signed)
Cardiology Office Note    Date:  02/15/2018   ID:  Christian Maxwell, DOB 12/16/1932, MRN [id number]  PCP:  Ria Bush, MD  Cardiologist: Dr. Acie Fredrickson  Chief Complaint: Hospital follow up   History of Present Illness:   Christian Maxwell is a 84 y.o. male w/ h/o chronic afib, dyslipidemia, h/o PVCs, LBBB, dementia presents for hospital follow up.  Admitted 9/11-9/19 admitted for [place]. Held antiplatelet (aspirin) and anticoagulation (coumadin). Pt had 2 falls in the past 2 months prior to [place].  Of note, his ICH happened with a normal INR, so there will still be some risk with a DOAC.  Per neuro - repeat head CT in 2 weeks and resume therapy afterwards.   Repeat CT of head 02/12/2018 showed: IMPRESSION: 1. Left thalamic hemorrhage has for the most part cleared. Tiny hyperdensity in this region may represent minimal blood-stained encephalomalacia. No new intracranial hemorrhage. 2. Chronic microvascular changes. 3. Global atrophy.  Here today for follow-up. The patient denies nausea, vomiting, fever, chest pain, palpitations, shortness of breath, orthopnea, PND, dizziness, syncope, cough, congestion, abdominal pain, hematochezia, melena, lower extremity edema, slurred speech, vision change or one-sided body weakness.   Past Medical History:  Diagnosis Date  . Abscess 7/13   abd abscess  . Cancer [facility])    colon cancer  . Chronic atrial fibrillation   . History of colon cancer 2000   T3N0 s/p colectomy  . History of echocardiogram    a. Echo 2/13:  Mild LVH, EF 55-60%, mild MR, moderate LAE, mild to moderate RAE;  b. Echo 1/17: mild LVH, vigorous LVF, EF 65-70%, no RWMA, trivial AI, trivial MR, severe LAE, mild RAE, mod TR, PASP 32 mmHg  . History of stress test    a. Cardiolite in 9/04: EF 67%, no ischemia.;  b. Myoview 6/16: no ischemia   . Hx of adenomatous colonic polyps   . Hyperlipidemia   . Hypertension   . METHICILLIN RESISTANT STAPHYLOCOCCUS AUREUS INFECTION  12/12/2006   Annotation: stitch abcess in lower abdomen after colon cancer resection Qualifier: Diagnosis of  By: [name] MD, [name]    . Myocardial infarction ([place]) 1985  . Postoperative stitch abscess 07/19/2011  . PVC's (premature ventricular contractions)   . Syncope and collapse    pt denies  . Thyroid disease    followed by [name]    Past Surgical History:  Procedure Laterality Date  . COLECTOMY  2000   6 inches removed  . COLONOSCOPY  2009   polyp, h/o cancer [name])  . EYE SURGERY     cataract surgery both eyes- pts ates he has never had cataract surgery 10-26-15  . HERNIA REPAIR  06/3298   DB umbilical hernia  . INCISE AND DRAIN ABCESS  2009,2010,2011,2012  . INCISION AND DRAINAGE Right 04/02/2013   Procedure: INCISION AND DRAINAGE RIGHT KNEE HEMATOMA;  Surgeon: [name], MD;  Location: [facility];  Service: Orthopedics;  Laterality: Right;  . INCISION AND DRAINAGE ABSCESS N/A 09/20/2012   Procedure: INCISION AND DRAINAGE SUPRAPUBIC ABSCESS;  Surgeon: [name], MD;  Location: [facility];  Service: General;  Laterality: N/A;  . INCISION AND DRAINAGE ABSCESS Left 02/28/2013   Procedure: INCISION AND DRAINAGE LEFT ARM ABSCESS;  Surgeon: [name], MD;  Location: [facility];  Service: General;  Laterality: Left;  . LUMBAR LAMINECTOMY  1977  . MASS EXCISION  03/07/2012   EXCISION MASS;  Surgeon: [name], MD;  Laterality: Right;  removal mass posterior right arm  . POLYPECTOMY    . WOUND EXPLORATION  2006   For possible stitch abscess Dr [name]    Current Medications: Prior to Admission medications   Medication Sig Start Date End Date Taking? Authorizing Provider  acetaminophen (TYLENOL) 325 MG tablet Take 2 tablets (650 mg total) by mouth every 4 (four) hours as needed for headache or mild pain. 01/31/18   Angiulli, Lavon Paganini, PA-C  ALPRAZolam Duanne Moron) 0.25 MG tablet Take 1 tablet (0.25 mg total) by mouth 2 (two) times daily as needed  for anxiety. 01/31/18   Angiulli, Lavon Paganini, PA-C  Artificial Tear Ointment (DRY EYES OP) Apply 1 drop to eye as needed (dry eyes).    [provider]  atorvastatin (LIPITOR) 40 MG tablet Take 1 tablet (40 mg total) by mouth daily at 6 PM. 01/31/18   Angiulli, Lavon Paganini, PA-C  donepezil (ARICEPT) 10 MG tablet Take 1 tablet (10 mg total) by mouth at bedtime. 01/31/18   Angiulli, Lavon Paganini, PA-C  fish oil-omega-3 fatty acids 1000 MG capsule Take 1 capsule (1 g total) by mouth daily. 01/31/18   Angiulli, Lavon Paganini, PA-C  folic acid (FOLVITE) 1 MG tablet Take 1 tablet (1 mg total) by mouth daily. 01/31/18   Angiulli, Lavon Paganini, PA-C  metoprolol tartrate (LOPRESSOR) 25 MG tablet Take 1 tablet (25 mg total) by mouth 2 (two) times daily. 01/31/18   Angiulli, Lavon Paganini, PA-C  nitroGLYCERIN (NITROSTAT) 0.4 MG SL tablet Place 1 tablet (0.4 mg total) under the tongue every 5 (five) minutes as needed for chest pain. Patient not taking: Reported on 02/14/2018 01/14/16   Richardson Dopp T, PA-C  pantoprazole (PROTONIX) 40 MG tablet Take 1 tablet (40 mg total) by mouth daily. Patient not taking: Reported on 02/14/2018 01/31/18   Angiulli, Lavon Paganini, PA-C  simvastatin (ZOCOR) 40 MG tablet Take 1 tablet (40 mg total) by mouth every morning. Patient needs to call and schedule an appointment for further refills 1st attempt Patient not taking: Reported on 02/14/2018 01/31/18   Angiulli, Lavon Paganini, PA-C  zolpidem (AMBIEN) 10 MG tablet Take 1 tablet (10 mg total) by mouth at bedtime as needed for sleep. Patient not taking: Reported on 02/14/2018 01/31/18   Angiulli, Lavon Paganini, PA-C    Allergies:   Patient has no known allergies.   Social History   Socioeconomic History  . Marital status: Widowed    Spouse name: Not on file  . Number of children: Not on file  . Years of education: Not on file  . Highest education level: Not on file  Occupational History  . Not on file  Social Needs  . Financial resource strain: Not very hard    . Food insecurity:    Worry: Never true    Inability: Never true  . Transportation needs:    Medical: No    Non-medical: No  Tobacco Use  . Smoking status: Former Smoker    Last attempt to quit: 10/13/1977    Years since quitting: 40.3  . Smokeless tobacco: Former Systems developer    Types: [place] date: 10/13/1977  Substance and Sexual Activity  . Alcohol use: No    Alcohol/week: 0.0 standard drinks  . Drug use: No  . Sexual activity: Never  Lifestyle  . Physical activity:    Days per week: 2 days    Minutes per session: 30 min  . Stress: Not at all  Relationships  . Social connections:  Talks on phone: Twice a week    Gets together: Twice a week    Attends religious service: More than 4 times per year    Active member of club or organization: Yes    Attends meetings of clubs or organizations: Never    Relationship status: Widowed  Other Topics Concern  . Not on file  Social History Narrative   Caffeine: none   Lives alone.  Grown daughters x2   Occupation: retired, had Human resources officer   Activity: take care of 3 houses   Diet: fruits/vegetables daily, good water      Cards: Ecologist   Endo: [place]   Surg: [name]   [facility]     Family History:  The patient's family history includes CAD (age of onset: 18) in his brother; CAD (age of onset: 53) in his father; Colon cancer in his cousin; Dementia in his brother and father; Dementia (age of onset: 67) in his sister; Diabetes in his brother; Hypertension in his father; Stroke in his brother, brother, father, and mother; Stroke (age of onset: 24) in his sister.   ROS:   Please see the history of present illness.    ROS All other systems reviewed and are negative.   PHYSICAL EXAM:   VS:  BP (!) 108/54   Pulse (!) 49   Ht 5\' 9"  (1.753 m)   Wt 80.7 kg   SpO2 93%   BMI 26.29 kg/m    GEN: Well nourished, well developed, in no acute distress  HEENT: normal  Neck: no JVD, carotid bruits, or masses Cardiac: Ir IR ; no  murmurs, rubs, or gallops,no edema  Respiratory:  clear to auscultation bilaterally, normal work of breathing GI: soft, nontender, nondistended, + BS MS: no deformity or atrophy  Skin: warm and dry, no rash Neuro:  Alert and Oriented x 3, Strength and sensation are intact Psych: euthymic mood, full affect  Wt Readings from Last 3 Encounters:  02/15/18 80.7 kg  02/14/18 80.7 kg  01/30/18 77 kg      Studies/Labs Reviewed:   EKG:  EKG is not ordered today.    Recent Labs: 01/21/2018: B Natriuretic Peptide 190.4 01/24/2018: Hemoglobin 16.9; Platelets 222 01/28/2018: ALT 32; BUN 17; Creatinine, Ser 0.94; Potassium 3.6; Sodium 142   Lipid Panel    Component Value Date/Time   CHOL 216 (H) 01/22/2018 0642   CHOL 147 10/04/2016 0801   TRIG 138 01/22/2018 0642   HDL 45 01/22/2018 0642   HDL 41 10/04/2016 0801   CHOLHDL 4.8 01/22/2018 0642   VLDL 28 01/22/2018 0642   LDLCALC 143 (H) 01/22/2018 0642   LDLCALC 71 10/04/2016 0801    Additional studies/ records that were reviewed today include:   Echocardiogram: 01/21/18 Study Conclusions  - Left ventricle: The cavity size was normal. There was mild   concentric hypertrophy. Systolic function was normal. The   estimated ejection fraction was in the range of 60% to 65%. Wall   motion was normal; there were no regional wall motion   abnormalities. - Ventricular septum: Septal motion showed abnormal function and   dyssynchrony consistent with bundle branch block. - Aortic valve: Transvalvular velocity was within the normal range.   There was no stenosis. There was trivial regurgitation. - Mitral valve: Transvalvular velocity was within the normal range.   There was no evidence for stenosis. There was no regurgitation. - Left atrium: The atrium was severely dilated. - Right ventricle: The cavity size was normal. Wall thickness  was   normal. Systolic function was normal. - Tricuspid valve: There was mild regurgitation. - Pulmonary  arteries: Systolic pressure was within the normal   range. PA peak pressure: 23 mm Hg (S).   ASSESSMENT & PLAN:    1. Persistent atrial fibrillation Rate controlled on metoprolol.  His Coumadin and aspirin discontinued after intracranial hemorrhage.  Plan to review anticoagulation after repeat CT scan which showed "No new intracranial hemorrhage".  I have reviewed personally with Dr. Acie Fredrickson during office visit>> anticoagulation and antiplatelet restart per neurology.  Dr. Acie Fredrickson cannot tell if hemorrhage has been resolved.  Need to weight balance issue. Asymptomatic.     Medication Adjustments/Labs and Tests Ordered: Current medicines are reviewed at length with the patient today.  Concerns regarding medicines are outlined above.  Medication changes, Labs and Tests ordered today are listed in the Patient Instructions below. Patient Instructions  Medication Instructions:  Your physician recommends that you continue on your current medications as directed. Please refer to the Current Medication list given to you today.  If you need a refill on your cardiac medications before your next appointment, please call your pharmacy.   Lab work: None ordered  If you have labs (blood work) drawn today and your tests are completely normal, you will receive your results only by: [name] MyChart Message (if you have MyChart) OR . A paper copy in the mail If you have any lab test that is abnormal or we need to change your treatment, we will call you to review the results.  Testing/Procedures: None ordered  Follow-Up: At [facility], you and your health needs are our priority.  As part of our continuing mission to provide you with exceptional heart care, we have created designated Provider Care Teams.  These Care Teams include your primary Cardiologist (physician) and Advanced Practice Providers (APPs -  Physician Assistants and Nurse Practitioners) who all work together to provide you with the care you  need, when you need it. You will need a follow up appointment in:  3 months.   You may see Mertie Moores, MD or one of the following Advanced Practice Providers on your designated Care Team: Richardson Dopp, PA-C [place], [place] . Daune Perch, NP  Any Other Special Instructions Will Be Listed Below (If Applicable).       Jarrett Soho, [place]  02/15/2018 11:08 AM    [address], [place], [place]  [address] Phone: [phone]; Fax: [phone]

## 2018-02-18 DIAGNOSIS — R2689 Other abnormalities of gait and mobility: Secondary | ICD-10-CM | POA: Diagnosis not present

## 2018-02-18 DIAGNOSIS — I69398 Other sequelae of cerebral infarction: Secondary | ICD-10-CM | POA: Diagnosis not present

## 2018-02-18 DIAGNOSIS — E785 Hyperlipidemia, unspecified: Secondary | ICD-10-CM | POA: Diagnosis not present

## 2018-02-18 DIAGNOSIS — I482 Chronic atrial fibrillation: Secondary | ICD-10-CM | POA: Diagnosis not present

## 2018-02-18 DIAGNOSIS — R7303 Prediabetes: Secondary | ICD-10-CM | POA: Diagnosis not present

## 2018-02-18 DIAGNOSIS — I10 Essential (primary) hypertension: Secondary | ICD-10-CM | POA: Diagnosis not present

## 2018-02-19 ENCOUNTER — Other Ambulatory Visit: Payer: Self-pay | Admitting: *Deleted

## 2018-02-19 ENCOUNTER — Other Ambulatory Visit: Payer: Self-pay | Admitting: Cardiovascular Disease

## 2018-02-19 MED ORDER — APIXABAN 5 MG PO TABS: 5.0000 mg | ORAL_TABLET | Freq: Two times a day (BID) | ORAL | 1 refills | Status: DC

## 2018-03-04 ENCOUNTER — Encounter: Payer: Self-pay | Admitting: Adult Health

## 2018-03-04 ENCOUNTER — Ambulatory Visit (INDEPENDENT_AMBULATORY_CARE_PROVIDER_SITE_OTHER): Payer: Medicare Other | Admitting: Adult Health

## 2018-03-04 VITALS — BP 114/72 | HR 60 | Ht 69.0 in | Wt 180.4 lb

## 2018-03-04 DIAGNOSIS — E785 Hyperlipidemia, unspecified: Secondary | ICD-10-CM | POA: Diagnosis not present

## 2018-03-04 DIAGNOSIS — I10 Essential (primary) hypertension: Secondary | ICD-10-CM | POA: Diagnosis not present

## 2018-03-04 DIAGNOSIS — I482 Chronic atrial fibrillation, unspecified: Secondary | ICD-10-CM

## 2018-03-04 DIAGNOSIS — I610 Nontraumatic intracerebral hemorrhage in hemisphere, subcortical: Secondary | ICD-10-CM | POA: Diagnosis not present

## 2018-03-04 DIAGNOSIS — G301 Alzheimer's disease with late onset: Secondary | ICD-10-CM | POA: Diagnosis not present

## 2018-03-04 DIAGNOSIS — F0280 Dementia in other diseases classified elsewhere without behavioral disturbance: Secondary | ICD-10-CM | POA: Diagnosis not present

## 2018-03-04 NOTE — Progress Notes (Signed)
[facility] [address] [place]. [place] [address] [phone]       OFFICE FOLLOW UP NOTE  [name] Date of Birth:  02/27/1933 Medical Record Number:  [id number]   Reason for Referral:  hospital stroke follow up  CHIEF COMPLAINT:  Chief Complaint  Patient presents with  . Follow-up    Hospital follow up. Patient accomplanied by his daughter. Rm 8. Concerns for the results of the CT scan.     HPI: Christian Maxwell is being seen today for initial visit in the office for left thalamic hemorrhage due to possible Coumadin associated coagulopathy source on 01/21/2018. History obtained from patient, daughter and chart review. Reviewed all radiology images and labs personally.  [name] is a 84 y.o. male with history of AF on warfarin, HTN, HLD, MI colon cancer, thyroid disease who presented with CP who was found to have dizziness, R hand clumsiness and difficulty walking during hospitalization.  CT head reviewed and showed 7 mm left thalamic hemorrhage along with small vessel disease and atrophy.  Repeat CT had showed unchanged and stable left thalamic hemorrhage.  MRI brain reviewed and showed 7 mm left thalamic hemorrhage with small vessel disease and atrophy most prominent in the temporal lobes.  Doppler showed bilateral ICA stenosis of 1 to 39% and VAs antegrade.  2D echo showed an EF of 60 to 65% without cardiac source of embolus.  LDL 143 and it was recommended to increase current statin regimen as he was previously on Zocor 40 mg and fish oil.  HTN stable during admission recommended SBP goal less than 140 given acute hemorrhage.  Patient is previously on warfarin and aspirin for management of persistent atrial fibrillation and finding of normal INR during admission.  It was recommendations to repeat CT head in 2 weeks and if this shows resolution of prior hemorrhage, can resume anticoagulation at that time.  It was also recommended to consider [facility] for  decrease bleeding risk along with speaking to Dr. Cathie Olden who is his current cardiologist in regards to possible change of anticoagulation.  Patient was discharged to [facility] for continued therapies.  During admission to CIR, repeat CT had obtained and showed that prior hemorrhage had resolved.  Patient was discharged home on 01/31/2018 with recommendations of outpatient therapies and continued supervision.  Patient is being seen today for hospital follow-up and is accompanied by his daughter.  He was followed up with cardiology outpatient and was recommended to start Eliquis for atrial fibrillation and secondary stroke prevention as repeat CT showed resolution of prior hemorrhage.  He states he does not have any type of residual deficits or recurring of symptoms since he is been discharged home.  He was seen by home PT/OT/ST and has since been released as he does not need any type of therapies.  He does receive CNA assistance daily from 8am-4pm which started after hospital discharge.  They do assist with transportation needs, occasional cleaning and any other type of assistance he may need during the day.  He does have underlying dementia but has been taking Aricept for years but patient and her daughter is able to state that they have many years.  Patient denies any worsening of memory since hospital discharge but daughter is stating that he has been having worsening since being home.  She is unable to state how exactly his memory has worsened since his [place] as patient gets upset with her if she mentions any worsening  of dementia.  She requested to the [place] who roomed the patient during visit to request if I am able to tell patient that he is unable to drive at this time due to worsening of dementia and daughter's concerns for his safety (daughter stating that he will not listen to her).  He does continue to live independently without any assistance overnight.  Blood pressure today satisfactory 114/72.  No further  concerns at this time.  Denies new or worsening stroke/TIA symptoms.   ROS:   14 system review of systems performed and negative with exception of activity change  PMH:  Past Medical History:  Diagnosis Date  . Abscess 7/13   abd abscess  . Cancer [facility])    colon cancer  . Chronic atrial fibrillation   . History of colon cancer 2000   T3N0 s/p colectomy  . History of echocardiogram    a. Echo 2/13:  Mild LVH, EF 55-60%, mild MR, moderate LAE, mild to moderate RAE;  b. Echo 1/17: mild LVH, vigorous LVF, EF 65-70%, no RWMA, trivial AI, trivial MR, severe LAE, mild RAE, mod TR, PASP 32 mmHg  . History of stress test    a. Cardiolite in 9/04: EF 67%, no ischemia.;  b. Myoview 6/16: no ischemia   . Hx of adenomatous colonic polyps   . Hyperlipidemia   . Hypertension   . METHICILLIN RESISTANT STAPHYLOCOCCUS AUREUS INFECTION 12/12/2006   Annotation: stitch abcess in lower abdomen after colon cancer resection Qualifier: Diagnosis of  By: [name] MD, [name]    . Myocardial infarction ([place]) 1985  . Postoperative stitch abscess 07/19/2011  . PVC's (premature ventricular contractions)   . Syncope and collapse    pt denies  . Thyroid disease    followed by [name]    PSH:  Past Surgical History:  Procedure Laterality Date  . COLECTOMY  2000   6 inches removed  . COLONOSCOPY  2009   polyp, h/o cancer Christian Maxwell)  . EYE SURGERY     cataract surgery both eyes- pts ates he has never had cataract surgery 10-26-15  . HERNIA REPAIR  10/2374   DB umbilical hernia  . INCISE AND DRAIN ABCESS  2009,2010,2011,2012  . INCISION AND DRAINAGE Right 04/02/2013   Procedure: INCISION AND DRAINAGE RIGHT KNEE HEMATOMA;  Surgeon: [name], MD;  Location: [place];  Service: Orthopedics;  Laterality: Right;  . INCISION AND DRAINAGE ABSCESS N/A 09/20/2012   Procedure: INCISION AND DRAINAGE SUPRAPUBIC ABSCESS;  Surgeon: [name], MD;  Location: [place];  Service: General;   Laterality: N/A;  . INCISION AND DRAINAGE ABSCESS Left 02/28/2013   Procedure: INCISION AND DRAINAGE LEFT ARM ABSCESS;  Surgeon: [name], MD;  Location: [place];  Service: General;  Laterality: Left;  . LUMBAR LAMINECTOMY  1977  . MASS EXCISION  03/07/2012   EXCISION MASS;  Surgeon: [name], MD;  Laterality: Right;  removal mass posterior right arm  . POLYPECTOMY    . WOUND EXPLORATION  2006   For possible stitch abscess Dr [name]    Social History:  Social History   Socioeconomic History  . Marital status: Widowed    Spouse name: Not on file  . Number of children: Not on file  . Years of education: Not on file  . Highest education level: Not on file  Occupational History  . Not on file  Social Needs  . Financial resource strain: Not very hard  . Food insecurity:  Worry: Never true    Inability: Never true  . Transportation needs:    Medical: No    Non-medical: No  Tobacco Use  . Smoking status: Former Smoker    Last attempt to quit: 10/13/1977    Years since quitting: 40.4  . Smokeless tobacco: Former Systems developer    Types: [place] date: 10/13/1977  Substance and Sexual Activity  . Alcohol use: No    Alcohol/week: 0.0 standard drinks  . Drug use: No  . Sexual activity: Never  Lifestyle  . Physical activity:    Days per week: 2 days    Minutes per session: 30 min  . Stress: Not at all  Relationships  . Social connections:    Talks on phone: Twice a week    Gets together: Twice a week    Attends religious service: More than 4 times per year    Active member of club or organization: Yes    Attends meetings of clubs or organizations: Never    Relationship status: Widowed  . Intimate partner violence:    Fear of current or ex partner: No    Emotionally abused: No    Physically abused: No    Forced sexual activity: No  Other Topics Concern  . Not on file  Social History Narrative   Caffeine: none   Lives alone.  Grown daughters x2   Occupation:  retired, had Human resources officer   Activity: take care of 3 houses   Diet: fruits/vegetables daily, good water      Cards: Ecologist   Endo: [place]   Surg: [name]   GIDeatra Maxwell    Family History:  Family History  Problem Relation Age of Onset  . Stroke Mother   . Stroke Father   . Hypertension Father   . CAD Father 75       MI  . Dementia Father   . Stroke Sister 72  . Stroke Brother   . Stroke Brother   . Diabetes Brother   . CAD Brother 7       MI  . Dementia Sister 37  . Dementia Brother   . Colon cancer Cousin        x4 total with colon cancer  . Cancer Neg Hx   . Colon polyps Neg Hx   . Rectal cancer Neg Hx   . Stomach cancer Neg Hx     Medications:   Current Outpatient Medications on File Prior to Visit  Medication Sig Dispense Refill  . apixaban (ELIQUIS) 5 MG TABS tablet Take 1 tablet (5 mg total) by mouth 2 (two) times daily. 60 tablet 1  . Artificial Tear Ointment (DRY EYES OP) Apply 1 drop to eye as needed (dry eyes).    [name] atorvastatin (LIPITOR) 40 MG tablet Take 1 tablet (40 mg total) by mouth daily at 6 PM. 30 tablet 1  . donepezil (ARICEPT) 10 MG tablet Take 10 mg by mouth at bedtime.    . metoprolol tartrate (LOPRESSOR) 25 MG tablet Take 25 mg by mouth 2 (two) times daily.    [name] zolpidem (AMBIEN) 10 MG tablet Take 1 tablet (10 mg total) by mouth at bedtime as needed for sleep. 20 tablet 0  . acetaminophen (TYLENOL) 325 MG tablet Take 2 tablets (650 mg total) by mouth every 4 (four) hours as needed for headache or mild pain. (Patient not taking: Reported on 03/04/2018)    . ALPRAZolam (XANAX) 0.25 MG tablet Take 1 tablet (0.25  mg total) by mouth 2 (two) times daily as needed for anxiety. (Patient not taking: Reported on 03/04/2018) 30 tablet 0   No current facility-administered medications on file prior to visit.     Allergies:  No Known Allergies   Physical Exam  Vitals:   03/04/18 1058  BP: 114/72  Pulse: 60  Weight: 180 lb 6.4 oz (81.8 kg)    Height: 5\' 9"  (1.753 m)   Body mass index is 26.64 kg/m. No exam data present  General: well developed, well nourished, pleasant elderly Caucasian male, seated, in no evident distress Head: head normocephalic and atraumatic.   Neck: supple with no carotid or supraclavicular bruits Cardiovascular: regular rate and rhythm, no murmurs Musculoskeletal: no deformity Skin:  no rash/petichiae Vascular:  Normal pulses all extremities  Neurologic Exam Mental Status: Awake and fully alert. Oriented to place and time. Remote memory intact (difficulty remembering certain medications or that he was seen by therapies at home after hospital discharge). Attention span, concentration and fund of knowledge mostly appropriate. Mood and affect appropriate.  Cranial Nerves: Fundoscopic exam reveals sharp disc margins. Pupils equal, briskly reactive to light. Extraocular movements full without nystagmus. Visual fields full to confrontation. Hearing intact. Facial sensation intact. Face, tongue, palate moves normally and symmetrically.  Motor: Normal bulk and tone. Normal strength in all tested extremity muscles. Sensory.: intact to touch , pinprick , position and vibratory sensation.  Coordination: Rapid alternating movements normal in all extremities. Finger-to-nose and heel-to-shin performed accurately bilaterally. Gait and Station: Arises from chair without difficulty. Stance is normal. Gait demonstrates normal stride length and balance . Unble to heel, toe and tandem walk without difficulty.  Reflexes: 1+ and symmetric. Toes downgoing.    NIHSS  0 Modified Rankin  2 HAS-BLED 2 CHA2DS2-VASc 6   Diagnostic Data (Labs, Imaging, Testing)  CT HEAD WO CONTRAST 01/21/2018 IMPRESSION: 7 mm hemorrhage in the left thalamus. No evidence of mass effect. Moderate brain parenchymal volume loss and microangiopathy.  CT HEAD WO CONTRAST (repeat) 01/22/2018 IMPRESSION: 1. Unchanged small 7-8 mm hemorrhage in  the left thalamus. No new hemorrhage or progressive mass effect.  MR BRAIN WO CONTRAST 01/22/2018 IMPRESSION: 7 mm acute hemorrhage left thalamus unchanged from CT. No underlying ischemic infarction or mass lesion. Cerebral atrophy most prominent the temporal lobes. Marked hippocampal atrophy bilaterally. Question dementia. Mild chronic microvascular ischemia in the white matter.  ECHOCARDIOGRAM 01/21/2018 Study Conclusions - Left ventricle: The cavity size was normal. There was mild   concentric hypertrophy. Systolic function was normal. The   estimated ejection fraction was in the range of 60% to 65%. Wall   motion was normal; there were no regional wall motion   abnormalities. - Ventricular septum: Septal motion showed abnormal function and   dyssynchrony consistent with bundle branch block. - Aortic valve: Transvalvular velocity was within the normal range.   There was no stenosis. There was trivial regurgitation. - Mitral valve: Transvalvular velocity was within the normal range.   There was no evidence for stenosis. There was no regurgitation. - Left atrium: The atrium was severely dilated. - Right ventricle: The cavity size was normal. Wall thickness was   normal. Systolic function was normal. - Tricuspid valve: There was mild regurgitation. - Pulmonary arteries: Systolic pressure was within the normal   range. PA peak pressure: 23 mm Hg (S).  CT HEAD WO CONTRAST 02/12/2018 IMPRESSION: 1. Left thalamic hemorrhage has for the most part cleared. Tiny hyperdensity in this region may represent minimal blood-stained encephalomalacia.  No new intracranial hemorrhage. 2. Chronic microvascular changes. 3. Global atrophy.    ASSESSMENT: Christian Maxwell is a 84 y.o. year old male here with left thalamic hemorrhage on 01/21/2018 secondary to Coumadin associated coagulopathy source. Vascular risk factors include PAF on Coumadin, HTN, HLD, MI, and dementia.  Patient returns today for  follow-up and denies any residual deficits or recurring symptoms but daughter has concerns regarding worsening dementia.  Repeat CT head during CIR showed resolution of prior hemorrhage therefore Eliquis was started for atrial fibrillation and secondary stroke prevention.    PLAN: -Continue Eliquis (apixaban) daily  and Lipitor 40 mg for secondary stroke prevention -F/u with PCP regarding your HLD and HTN management -f/u with cardiologist as scheduled for atrial fibrillation and Eliquis management -f/u with physical medicine Dr. Posey Pronto as scheduled -Advised patient that at this time is not safe for him to be driving due to recent hemorrhage along with possible worsening of dementia.  This can be reevaluated at follow-up appointment along with MMSE to evaluate current level of dementia and memory loss -continue to monitor BP at home -advised to continue to stay active and maintain a healthy diet -Maintain strict control of hypertension with blood pressure goal below 130/90, diabetes with hemoglobin A1c goal below 6.5% and cholesterol with LDL cholesterol (bad cholesterol) goal below 70 mg/dL. I also advised the patient to eat a healthy diet with plenty of whole grains, cereals, fruits and vegetables, exercise regularly and maintain ideal body weight.  Follow up in 3 months or call earlier if needed   Greater than 50% of time during this 25 minute visit was spent on counseling,explanation of diagnosis of left thalamic hemorrhage, reviewing risk factor management of atrial fibrillation, HTN, HLD, MI and dementia, planning of further management, discussion with patient and family and coordination of care    [name], AGNP-BC  [facility] [address] [place], [place] [address]  Phone [phone] Fax [phone] Note: This document was prepared with digital dictation and possible smart phrase technology. Any transcriptional errors that result from  this process are unintentional.

## 2018-03-04 NOTE — Patient Instructions (Signed)
Continue Eliquis and lipitor 40mg   for secondary stroke prevention  Continue to follow up with PCP regarding cholesterol and blood pressure management   Continue to follow up with cardiology for atrial fibrillation   Continue to monitor blood pressure at home  Maintain strict control of hypertension with blood pressure goal below 130/90, diabetes with hemoglobin A1c goal below 6.5% and cholesterol with LDL cholesterol (bad cholesterol) goal below 70 mg/dL. I also advised the patient to eat a healthy diet with plenty of whole grains, cereals, fruits and vegetables, exercise regularly and maintain ideal body weight.  Followup in the future with me in 3 months or call earlier if needed       Thank you for coming to see [place] at [facility]. I hope we have been able to provide you high quality care today.  You may receive a patient satisfaction survey over the next few weeks. We would appreciate your feedback and comments so that we may continue to improve ourselves and the health of our patients.

## 2018-03-06 ENCOUNTER — Telehealth: Payer: Self-pay | Admitting: *Deleted

## 2018-03-06 NOTE — Progress Notes (Signed)
I agree with the above plan 

## 2018-03-06 NOTE — Telephone Encounter (Signed)
Pt coming on 03/20/18 for 1 month Eliquis follow up.

## 2018-03-06 NOTE — Telephone Encounter (Signed)
-----   Message from [name], NP sent at 02/19/2018  4:31 PM EDT ----- Regarding: RE: Coumadin Resumption  Thank you! -[name] ----- Message ----- From: [name], RN Sent: 02/19/2018   2:43 PM EDT To: Thayer Headings, MD, [name], NP Subject: RE: Coumadin Resumption                        Per specified dosing criteria: Patient's age, 84 yrs old, Weight 80.7Kg, and SCr 0.94. Will order Eliquis 5mg  BID and have pt follow up in 19mth for repeat CBC/BMET.    ----- Message ----- From: Thayer Headings, MD Sent: 02/19/2018   1:39 PM EDT To: [name], RN, [name], NP Subject: RE: Coumadin Resumption                        If Eliquis in [place] with neuro, I would agree with the plan to start Eliquis Our coumadin clinic can dose and start the Eliquis  Thanks   [name]    ----- Message ----- From: [name], RN Sent: 02/19/2018   8:01 AM EDT To: Thayer Headings, MD Subject: FW: Coumadin Resumption                          ----- Message ----- From: [name], NP Sent: 02/18/2018   4:45 PM EDT To: [name], RN Subject: RE: Coumadin Resumption                        [name], CT head showed resolution of prior hemorrhage. It was recommended to start Eliquis by Dr. Erlinda Hong as this has less bleeding risk than coumadin. He is clear to start eliquis at this time for atrial fibrillation and secondary stroke prevention. He will not be seeing me until 10/24 but important to restart as soon as possible. Is Dr. Cathie Olden able to restart at this time? Thank you, [name], NP   ----- Message ----- From: [name], RN Sent: 02/15/2018   3:09 PM EDT To: [name], NP Subject: Coumadin Resumption                            Hello,  I am writing about the above patient who was recently in hospital for Thalamic hemorrhage. He has a follow up appt on 03/07/18 with you. He did have a CT on 02/12/18. Discussed pt with his  cardiologist, Dr Acie Fredrickson today and he wants Neurology to make the decision on resumption or not of his anticoagulant.(Coumadin). Please advise, thank you.  [name]

## 2018-03-07 ENCOUNTER — Ambulatory Visit: Payer: Medicare Other | Admitting: Adult Health

## 2018-03-07 ENCOUNTER — Telehealth: Payer: Self-pay

## 2018-03-07 NOTE — Telephone Encounter (Signed)
[name] NP reviewed form from pt. Its for long term care coverage through [employer]. Pts memory issues and dementia was handle via his PCP and Dr. Leona Carry for years prior to stroke. The form will be deferred to them per [name] NP.

## 2018-03-07 NOTE — Telephone Encounter (Signed)
Noted! Thank you

## 2018-03-11 NOTE — Telephone Encounter (Signed)
Pts daughter [name] (on [place]) requesting a call to discuss the pts long term care, advised it was sent to PCP. Would also like to know NP jessica's view on the pt driving

## 2018-03-12 ENCOUNTER — Telehealth: Payer: Self-pay | Admitting: *Deleted

## 2018-03-12 NOTE — Telephone Encounter (Signed)
Rn receive call from patients daughter about long term care form. Rn stated per [name] NP the form should be completed by his PCP.The daughter verbalized understanding.   The daughter wanted to know about his driving ability. Rn stated per [name] NP it was discuss in the last office note pt should not be driving to safety and his dementia.  The daughter stated the keys have been taken away, but her father has order another set of keys.RN stated the driving will be reevaluated in January 2020 at the next office note. RN stated there is form that can be sent to [facility] to state the medical provider has concerns, and it can be temporary done. The daughter stated any help can be appreciate. Rn stated the form is fax to [facility] but there is a time frame for them to process in the computer. Rn stated a call will be made back to her.

## 2018-03-12 NOTE — Telephone Encounter (Signed)
Left vm for patients daughter [name] to call back about clarification on fathers driving.

## 2018-03-12 NOTE — Telephone Encounter (Signed)
I LVM unable to reach pt daughter.

## 2018-03-12 NOTE — Telephone Encounter (Signed)
During office visit, we discussed driving as requested by daughter and I advised patient and daughter that he should not be driving at this time and that we could reevaluate at follow up appointment. As far as long term care, I am unsure on what she is referring to. He currently has aides that come in daily to assist but was not mentioned anything else. Unsure if she is talking about placement in a facility?

## 2018-03-12 NOTE — Telephone Encounter (Addendum)
Left detail message for daughter [name] that request for driver examination form to [phone] was fax. Rn left message that the form is saying the MD has concerns pt is driving and is not safe. Rn also left message that the [facility] is unable to release its final recommendation to our office and that's their guidelines. The DMV will investigate.Form was fax twice and confirmed.

## 2018-03-20 ENCOUNTER — Ambulatory Visit (INDEPENDENT_AMBULATORY_CARE_PROVIDER_SITE_OTHER): Payer: Medicare Other | Admitting: *Deleted

## 2018-03-20 DIAGNOSIS — I482 Chronic atrial fibrillation, unspecified: Secondary | ICD-10-CM

## 2018-03-20 DIAGNOSIS — Z5181 Encounter for therapeutic drug level monitoring: Secondary | ICD-10-CM

## 2018-03-20 NOTE — Progress Notes (Signed)
Pt was started on Eliquis 5mg  twice a day for Afib on 03/06/18 by Dr. Acie Fredrickson .    Reviewed patients medication list.  Pt is not currently on any combined P-gp and strong CYP3A4 inhibitors/inducers (ketoconazole, traconazole, ritonavir, carbamazepine, phenytoin, rifampin, St. John's wort).  Reviewed labs.  SCr 0.93, Weight-83.7kg, Age-8, Hgb- 15.3, HCT-47.5.  Dose appropriate based on age, weight, and SCr.  Hgb and HCT Within Normal Limits.  Called spoke with pt advised dosage is appropriate, continue on Eliquis 5mg  BID.  Recheck labwork in 6 months.

## 2018-03-21 LAB — BASIC METABOLIC PANEL
BUN/Creatinine Ratio: 16 (ref 10–24)
BUN: 15 mg/dL (ref 8–27)
CALCIUM: 9.9 mg/dL (ref 8.6–10.2)
CO2: 27 mmol/L (ref 20–29)
CREATININE: 0.93 mg/dL (ref 0.76–1.27)
Chloride: 103 mmol/L (ref 96–106)
GFR, EST AFRICAN AMERICAN: 87 mL/min/{1.73_m2} (ref >59)
GFR, EST NON AFRICAN AMERICAN: 76 mL/min/{1.73_m2} (ref >59)
Glucose: 92 mg/dL (ref 65–99)
POTASSIUM: 4.6 mmol/L (ref 3.5–5.2)
Sodium: 143 mmol/L (ref 134–144)

## 2018-03-21 LAB — CBC
HEMATOCRIT: 47.5 % (ref 37.5–51.0)
HEMOGLOBIN: 15.3 g/dL (ref 13.0–17.7)
MCH: 28.5 pg (ref 26.6–33.0)
MCHC: 32.2 g/dL (ref 31.5–35.7)
MCV: 89 fL (ref 79–97)
Platelets: 222 10*3/uL (ref 150–450)
RBC: 5.37 x10E6/uL (ref 4.14–5.80)
RDW: 13.1 % (ref 12.3–15.4)
WBC: 6.6 10*3/uL (ref 3.4–10.8)

## 2018-03-21 NOTE — Patient Instructions (Signed)
A full discussion of the nature of anticoagulants has been carried out.  A benefit/risk analysis has been presented to the patient, so that they understand the justification for choosing anticoagulation with Eliquis at this time.  The need for compliance is stressed.  Pt is aware to take the medication twice daily.  Side effects of potential bleeding are discussed, including unusual colored urine or stools, coughing up blood or coffee ground emesis, nose bleeds or serious fall or head trauma.  Discussed signs and symptoms of stroke. The patient should avoid any OTC items containing aspirin or ibuprofen.  Avoid alcohol consumption.   Call if any signs of abnormal bleeding.  Discussed financial obligations and resolved any difficulty in obtaining medication.

## 2018-03-28 ENCOUNTER — Encounter: Payer: Self-pay | Admitting: Physical Medicine & Rehabilitation

## 2018-03-28 ENCOUNTER — Encounter: Payer: Medicare Other | Attending: Physical Medicine & Rehabilitation | Admitting: Physical Medicine & Rehabilitation

## 2018-03-28 ENCOUNTER — Encounter: Payer: Medicare Other | Admitting: Physical Medicine & Rehabilitation

## 2018-03-28 VITALS — BP 126/61 | HR 69 | Resp 14 | Ht 69.0 in | Wt 184.0 lb

## 2018-03-28 DIAGNOSIS — I252 Old myocardial infarction: Secondary | ICD-10-CM | POA: Diagnosis not present

## 2018-03-28 DIAGNOSIS — F0280 Dementia in other diseases classified elsewhere without behavioral disturbance: Secondary | ICD-10-CM

## 2018-03-28 DIAGNOSIS — E785 Hyperlipidemia, unspecified: Secondary | ICD-10-CM | POA: Diagnosis not present

## 2018-03-28 DIAGNOSIS — Z87891 Personal history of nicotine dependence: Secondary | ICD-10-CM | POA: Diagnosis not present

## 2018-03-28 DIAGNOSIS — Z9049 Acquired absence of other specified parts of digestive tract: Secondary | ICD-10-CM | POA: Insufficient documentation

## 2018-03-28 DIAGNOSIS — Z7901 Long term (current) use of anticoagulants: Secondary | ICD-10-CM | POA: Diagnosis not present

## 2018-03-28 DIAGNOSIS — I10 Essential (primary) hypertension: Secondary | ICD-10-CM | POA: Diagnosis not present

## 2018-03-28 DIAGNOSIS — I482 Chronic atrial fibrillation, unspecified: Secondary | ICD-10-CM | POA: Diagnosis not present

## 2018-03-28 DIAGNOSIS — Z8249 Family history of ischemic heart disease and other diseases of the circulatory system: Secondary | ICD-10-CM | POA: Diagnosis not present

## 2018-03-28 DIAGNOSIS — Z85038 Personal history of other malignant neoplasm of large intestine: Secondary | ICD-10-CM | POA: Diagnosis not present

## 2018-03-28 DIAGNOSIS — G301 Alzheimer's disease with late onset: Secondary | ICD-10-CM

## 2018-03-28 DIAGNOSIS — I610 Nontraumatic intracerebral hemorrhage in hemisphere, subcortical: Secondary | ICD-10-CM | POA: Diagnosis not present

## 2018-03-28 DIAGNOSIS — E079 Disorder of thyroid, unspecified: Secondary | ICD-10-CM | POA: Insufficient documentation

## 2018-03-28 DIAGNOSIS — F0390 Unspecified dementia without behavioral disturbance: Secondary | ICD-10-CM | POA: Diagnosis not present

## 2018-03-28 NOTE — Progress Notes (Signed)
Subjective:    Patient ID: Christian Maxwell, male    DOB: 1933-01-30, 84 y.o.   MRN: [id number]  HPI 84 year old right-handed male with history of dementia, maintained on Aricept. Chronic atrial fibrillation, on Coumadin presents for follow up for left thalamic hemorrhage.   Last clinic visit 02/14/18.  Presents with daughter who supplements history. Since that time, pt states he was released from therapies.  He saw Neurology and had another appointment in 3 months. He saw Cards and was started on Eliquis. BP is controlled. He lives at home by himself with assistance.   Pain Inventory Average Pain 0 Pain Right Now 0 My pain is no pain  In the last 24 hours, has pain interfered with the following? General activity 0 Relation with others 0 Enjoyment of life 0 What TIME of day is your pain at its worst? no pain Sleep (in general) Good  Pain is worse with: no pain Pain improves with: no pain Relief from Meds: no meds  Mobility walk without assistance how many minutes can you walk? 10 ability to climb steps?  yes do you drive?  no needs help with transfers Do you have any goals in this area?  no  Function retired I need assistance with the following:  household duties and shopping  Neuro/Psych confusion  Prior Studies Any changes since last visit?  no  Physicians involved in your care Any changes since last visit?  no   Family History  Problem Relation Age of Onset  . Stroke Mother   . Stroke Father   . Hypertension Father   . CAD Father 50       MI  . Dementia Father   . Stroke Sister 52  . Stroke Brother   . Stroke Brother   . Diabetes Brother   . CAD Brother 73       MI  . Dementia Sister 2  . Dementia Brother   . Colon cancer Cousin        x4 total with colon cancer  . Cancer Neg Hx   . Colon polyps Neg Hx   . Rectal cancer Neg Hx   . Stomach cancer Neg Hx    Social History   Socioeconomic History  . Marital status: Widowed    Spouse name: Not  on file  . Number of children: Not on file  . Years of education: Not on file  . Highest education level: Not on file  Occupational History  . Not on file  Social Needs  . Financial resource strain: Not very hard  . Food insecurity:    Worry: Never true    Inability: Never true  . Transportation needs:    Medical: No    Non-medical: No  Tobacco Use  . Smoking status: Former Smoker    Last attempt to quit: 10/13/1977    Years since quitting: 40.4  . Smokeless tobacco: Former Systems developer    Types: Groom date: 10/13/1977  Substance and Sexual Activity  . Alcohol use: No    Alcohol/week: 0.0 standard drinks  . Drug use: No  . Sexual activity: Never  Lifestyle  . Physical activity:    Days per week: 2 days    Minutes per session: 30 min  . Stress: Not at all  Relationships  . Social connections:    Talks on phone: Twice a week    Gets together: Twice a week    Attends religious service: More than 4  times per year    Active member of club or organization: Yes    Attends meetings of clubs or organizations: Never    Relationship status: Widowed  Other Topics Concern  . Not on file  Social History Narrative   Caffeine: none   Lives alone.  Grown daughters x2   Occupation: retired, had Human resources officer   Activity: take care of 3 houses   Diet: fruits/vegetables daily, good water      Cards: Ecologist   [facility] [facility]   Surg: [name]   [facility]   Past Surgical History:  Procedure Laterality Date  . COLECTOMY  2000   6 inches removed  . COLONOSCOPY  2009   polyp, h/o cancer [name])  . EYE SURGERY     cataract surgery both eyes- pts ates he has never had cataract surgery 10-26-15  . HERNIA REPAIR  07/8175   DB umbilical hernia  . INCISE AND DRAIN ABCESS  2009,2010,2011,2012  . INCISION AND DRAINAGE Right 04/02/2013   Procedure: INCISION AND DRAINAGE RIGHT KNEE HEMATOMA;  Surgeon: [name], MD;  Location: [facility];  Service: Orthopedics;  Laterality: Right;  .  INCISION AND DRAINAGE ABSCESS N/A 09/20/2012   Procedure: INCISION AND DRAINAGE SUPRAPUBIC ABSCESS;  Surgeon: [name], MD;  Location: [facility];  Service: General;  Laterality: N/A;  . INCISION AND DRAINAGE ABSCESS Left 02/28/2013   Procedure: INCISION AND DRAINAGE LEFT ARM ABSCESS;  Surgeon: [name], MD;  Location: [facility];  Service: General;  Laterality: Left;  . LUMBAR LAMINECTOMY  1977  . MASS EXCISION  03/07/2012   EXCISION MASS;  Surgeon: [name], MD;  Laterality: Right;  removal mass posterior right arm  . POLYPECTOMY    . WOUND EXPLORATION  2006   For possible stitch abscess Dr [name]   Past Medical History:  Diagnosis Date  . Abscess 7/13   abd abscess  . Cancer [facility])    colon cancer  . Chronic atrial fibrillation   . History of colon cancer 2000   T3N0 s/p colectomy  . History of echocardiogram    a. Echo 2/13:  Mild LVH, EF 55-60%, mild MR, moderate LAE, mild to moderate RAE;  b. Echo 1/17: mild LVH, vigorous LVF, EF 65-70%, no RWMA, trivial AI, trivial MR, severe LAE, mild RAE, mod TR, PASP 32 mmHg  . History of stress test    a. Cardiolite in 9/04: EF 67%, no ischemia.;  b. Myoview 6/16: no ischemia   . Hx of adenomatous colonic polyps   . Hyperlipidemia   . Hypertension   . METHICILLIN RESISTANT STAPHYLOCOCCUS AUREUS INFECTION 12/12/2006   Annotation: stitch abcess in lower abdomen after colon cancer resection Qualifier: Diagnosis of  By: [name] MD, [name]    . Myocardial infarction ([place]) 1985  . Postoperative stitch abscess 07/19/2011  . PVC's (premature ventricular contractions)   . Syncope and collapse    pt denies  . Thyroid disease    followed by [name]   BP 126/61   Pulse 69   Resp 14   Ht 5\' 9"  (1.753 m)   Wt 184 lb (83.5 kg)   SpO2 96%   BMI 27.17 kg/m   Opioid Risk Score:   Fall Risk Score:  `1  Depression screen PHQ 2/9  Depression screen [facility] 2/9 02/14/2018 11/11/2015 11/05/2015 11/06/2014 12/12/2012    Decreased Interest 0 0 0 0 0  Down, Depressed, Hopeless 0 0 0 0 0  PHQ -  2 Score 0 0 0 0 0  Some recent data might be hidden   Review of Systems  Constitutional: Negative.   HENT: Negative.   Eyes: Negative.   Respiratory: Negative.   Cardiovascular: Negative.   Gastrointestinal: Negative.   Endocrine: Negative.   Genitourinary: Negative.   Musculoskeletal: Negative.   Skin: Negative.   Allergic/Immunologic: Negative.   Neurological: Negative.   Hematological: Negative.   Psychiatric/Behavioral: Positive for confusion.  All other systems reviewed and are negative.     Objective:   Physical Exam Constitutional: He appears well-developed and well-nourished.  HENT: Normocephalic and atraumatic.  Eyes: EOM are normal. No discharge.  Cardiovascular: Irregularly irregular. No JVD.  Respiratory: Effort normal and breath sounds normal.  GI: Bowel sounds are normal. He exhibits no distension.  Musculoskeletal: No edema or tenderness in extremities  Neurological: He is alert.  Significant memory deficits HOH Follows simple commands.   Motor: Right upper extremity/right lower extremity 5/5 proximal distal  Left upper extremity: 5/5 proximal to distal Left lower extremity: 5/5 proximal to distal Skin: Skin is warm and dry.  Psychiatric: His affect is flat.     Assessment & Plan:  84 year old right-handed male with history of dementia, maintained on Aricept. Chronic atrial fibrillation, on Coumadin presents for follow up for left thalamic hemorrhage.   1.  History of dementia with left thalamic hemorrhage secondary to small vessel disease versus Coumadin associated coagulopathy source             Released from therapies, encouraged HEP  Cont follow up with Neurology   Daughter with paperwork for insurance company asks to be filled out  2. Mood:   Cont meds  3  Chronic atrial fibrillation.  CT head reviewed, significantly improved   Cont follow up with Cards  4.   Hypertension.    Cont meds  Controlled at present

## 2018-04-20 ENCOUNTER — Other Ambulatory Visit: Payer: Self-pay | Admitting: Cardiovascular Disease

## 2018-05-20 ENCOUNTER — Ambulatory Visit: Payer: Medicare Other | Admitting: Cardiovascular Disease

## 2018-06-11 ENCOUNTER — Ambulatory Visit: Payer: Medicare Other | Admitting: Adult Health

## 2018-07-09 ENCOUNTER — Ambulatory Visit: Payer: Medicare Other | Admitting: Cardiovascular Disease

## 2018-07-11 ENCOUNTER — Ambulatory Visit (INDEPENDENT_AMBULATORY_CARE_PROVIDER_SITE_OTHER): Payer: Medicare Other | Admitting: Pharmacist

## 2018-07-11 VITALS — Wt 192.0 lb

## 2018-07-11 DIAGNOSIS — I482 Chronic atrial fibrillation, unspecified: Secondary | ICD-10-CM

## 2018-07-11 LAB — CBC
HEMOGLOBIN: 15.8 g/dL (ref 13.0–17.7)
Hematocrit: 47.5 % (ref 37.5–51.0)
MCH: 29 pg (ref 26.6–33.0)
MCHC: 33.3 g/dL (ref 31.5–35.7)
MCV: 87 fL (ref 79–97)
PLATELETS: 184 10*3/uL (ref 150–450)
RBC: 5.44 x10E6/uL (ref 4.14–5.80)
RDW: 12.1 % (ref 11.6–15.4)
WBC: 6.2 10*3/uL (ref 3.4–10.8)

## 2018-07-11 LAB — BASIC METABOLIC PANEL
BUN/Creatinine Ratio: 16 (ref 10–24)
BUN: 15 mg/dL (ref 8–27)
CALCIUM: 10.2 mg/dL (ref 8.6–10.2)
CO2: 26 mmol/L (ref 20–29)
CREATININE: 0.94 mg/dL (ref 0.76–1.27)
Chloride: 99 mmol/L (ref 96–106)
GFR calc Af Amer: 86 mL/min/{1.73_m2} (ref >59)
GFR calc non Af Amer: 75 mL/min/{1.73_m2} (ref >59)
GLUCOSE: 131 mg/dL — AB (ref 65–99)
POTASSIUM: 4.6 mmol/L (ref 3.5–5.2)
SODIUM: 140 mmol/L (ref 134–144)

## 2018-07-11 NOTE — Progress Notes (Signed)
Pt was started on Eliquis for Afib on 03/06/18 by Dr. Acie Fredrickson  Reviewed patients medication list.  Pt is not currently on any combined P-gp and strong CYP3A4 inhibitors/inducers (ketoconazole, traconazole, ritonavir, carbamazepine, phenytoin, rifampin, St. John's wort).  Reviewed labs.  SCr 0.94, Weight 192 lbs, CrCl- 59ml/min.  Dose appropriate based on age, weight, and SCr.  Hgb and HCT Within Normal Limits  A full discussion of the nature of anticoagulants has been carried out.  A benefit/risk analysis has been presented to the patient, so that they understand the justification for choosing anticoagulation with Eliquis at this time.  The need for compliance is stressed.  Pt is aware to take the medication twice daily.  Side effects of potential bleeding are discussed, including unusual colored urine or stools, coughing up blood or coffee ground emesis, nose bleeds or serious fall or head trauma.  Discussed signs and symptoms of stroke. The patient should avoid any OTC items containing aspirin or ibuprofen.  Avoid alcohol consumption.   Call if any signs of abnormal bleeding.  Discussed financial obligations and resolved any difficulty in obtaining medication.  Patient denies problems affording.

## 2018-07-12 ENCOUNTER — Telehealth: Payer: Self-pay | Admitting: Pharmacist

## 2018-07-12 NOTE — Telephone Encounter (Signed)
Attempted to call patient to let him know his blood work was normal. No answer. Left message to return call

## 2018-07-18 ENCOUNTER — Other Ambulatory Visit: Payer: Self-pay | Admitting: Cardiovascular Disease

## 2018-08-28 DIAGNOSIS — E0500 Thyrotoxicosis with diffuse goiter without thyrotoxic crisis or storm: Secondary | ICD-10-CM | POA: Diagnosis not present

## 2018-08-28 DIAGNOSIS — I618 Other nontraumatic intracerebral hemorrhage: Secondary | ICD-10-CM | POA: Diagnosis not present

## 2018-08-28 DIAGNOSIS — E785 Hyperlipidemia, unspecified: Secondary | ICD-10-CM | POA: Diagnosis not present

## 2018-08-28 DIAGNOSIS — F0280 Dementia in other diseases classified elsewhere without behavioral disturbance: Secondary | ICD-10-CM | POA: Diagnosis not present

## 2018-08-28 DIAGNOSIS — G4700 Insomnia, unspecified: Secondary | ICD-10-CM | POA: Diagnosis not present

## 2018-08-28 DIAGNOSIS — R740 Nonspecific elevation of levels of transaminase and lactic acid dehydrogenase [LDH]: Secondary | ICD-10-CM | POA: Diagnosis not present

## 2018-08-28 DIAGNOSIS — I480 Paroxysmal atrial fibrillation: Secondary | ICD-10-CM | POA: Diagnosis not present

## 2018-08-28 DIAGNOSIS — Z1331 Encounter for screening for depression: Secondary | ICD-10-CM | POA: Diagnosis not present

## 2018-08-28 DIAGNOSIS — Z1339 Encounter for screening examination for other mental health and behavioral disorders: Secondary | ICD-10-CM | POA: Diagnosis not present

## 2018-08-28 DIAGNOSIS — C189 Malignant neoplasm of colon, unspecified: Secondary | ICD-10-CM | POA: Diagnosis not present

## 2018-09-26 ENCOUNTER — Encounter: Payer: Medicare Other | Admitting: Physical Medicine & Rehabilitation

## 2018-09-26 ENCOUNTER — Ambulatory Visit: Payer: Medicare Other | Admitting: Physical Medicine & Rehabilitation

## 2018-10-04 ENCOUNTER — Telehealth: Payer: Self-pay

## 2018-10-04 NOTE — Telephone Encounter (Signed)
Obtained verbal consent from pt for a phone visit. Pt will have vitals ready for appt.     YOUR CARDIOLOGY TEAM HAS ARRANGED FOR AN E-VISIT FOR YOUR APPOINTMENT - PLEASE REVIEW IMPORTANT INFORMATION BELOW SEVERAL DAYS PRIOR TO YOUR APPOINTMENT  Due to the recent COVID-19 pandemic, we are transitioning in-person office visits to tele-medicine visits in an effort to decrease unnecessary exposure to our patients, their families, and staff. These visits are billed to your insurance just like a normal visit is. We also encourage you to sign up for MyChart if you have not already done so. You will need a smartphone if possible. For patients that do not have this, we can still complete the visit using a regular telephone but do prefer a smartphone to enable video when possible. You may have a family member that lives with you that can help. If possible, we also ask that you have a blood pressure cuff and scale at home to measure your blood pressure, heart rate and weight prior to your scheduled appointment. Patients with clinical needs that need an in-person evaluation and testing will still be able to come to the office if absolutely necessary. If you have any questions, feel free to call our office.     YOUR PROVIDER WILL BE USING THE FOLLOWING PLATFORM TO COMPLETE YOUR VISIT: [email or web address] . IF USING MYCHART - How to Download the MyChart App to Your SmartPhone   - If Apple, go to [employer] and type in MyChart in the search bar and download the app. If Android, ask patient to go to [employer] and type in [place] in the search bar and download the app. The app is free but as with any other app downloads, your phone may require you to verify saved payment information or Apple/Android password.  - You will need to then log into the app with your MyChart username and password, and select [facility] as your healthcare provider to link the account.  - When it is time for your visit, go to the MyChart app,  find appointments, and click Begin Video Visit. Be sure to Select Allow for your device to access the Microphone and Camera for your visit. You will then be connected, and your provider will be with you shortly.  **If you have any issues connecting or need assistance, please contact MyChart service desk [phone])**  **If using a computer, in order to ensure the best quality for your visit, you will need to use either of the following Internet Browsers: Insurance underwriter or [employer]**  . IF USING DOXIMITY or [email or web address].ME - The staff will give you instructions on receiving your link to join the meeting the day of your visit.      2-3 DAYS BEFORE YOUR APPOINTMENT  You will receive a telephone call from one of our [facility] team members - your caller ID may say "Unknown caller." If this is a video visit, we will walk you through how to get the video launched on your phone. We will remind you check your blood pressure, heart rate and weight prior to your scheduled appointment. If you have an Apple Watch or Kardia, please upload any pertinent ECG strips the day before or morning of your appointment to [facility]. Our staff will also make sure you have reviewed the consent and agree to move forward with your scheduled tele-health visit.     THE DAY OF YOUR APPOINTMENT  Approximately 15 minutes prior to your scheduled appointment, you  will receive a telephone call from one of [facility] team - your caller ID may say "Unknown caller."  Our staff will confirm medications, vital signs for the day and any symptoms you may be experiencing. Please have this information available prior to the time of visit start. It may also be helpful for you to have a pad of paper and pen handy for any instructions given during your visit. They will also walk you through joining the smartphone meeting if this is a video visit.    CONSENT FOR TELE-HEALTH VISIT - PLEASE REVIEW  I hereby voluntarily request,  consent and authorize [facility] and its employed or contracted physicians, physician assistants, nurse practitioners or other licensed health care professionals (the Practitioner), to provide me with telemedicine health care services (the "Services") as deemed necessary by the treating Practitioner. I acknowledge and consent to receive the Services by the Practitioner via telemedicine. I understand that the telemedicine visit will involve communicating with the Practitioner through live audiovisual communication technology and the disclosure of certain medical information by electronic transmission. I acknowledge that I have been given the opportunity to request an in-person assessment or other available alternative prior to the telemedicine visit and am voluntarily participating in the telemedicine visit.  I understand that I have the right to withhold or withdraw my consent to the use of telemedicine in the course of my care at any time, without affecting my right to future care or treatment, and that the Practitioner or I may terminate the telemedicine visit at any time. I understand that I have the right to inspect all information obtained and/or recorded in the course of the telemedicine visit and may receive copies of available information for a reasonable fee.  I understand that some of the potential risks of receiving the Services via telemedicine include:  [name] Delay or interruption in medical evaluation due to technological equipment failure or disruption; . Information transmitted may not be sufficient (e.g. poor resolution of images) to allow for appropriate medical decision making by the Practitioner; and/or  . In rare instances, security protocols could fail, causing a breach of personal health information.  Furthermore, I acknowledge that it is my responsibility to provide information about my medical history, conditions and care that is complete and accurate to the best of my ability. I  acknowledge that Practitioner's advice, recommendations, and/or decision may be based on factors not within their control, such as incomplete or inaccurate data provided by me or distortions of diagnostic images or specimens that may result from electronic transmissions. I understand that the practice of medicine is not an exact science and that Practitioner makes no warranties or guarantees regarding treatment outcomes. I acknowledge that I will receive a copy of this consent concurrently upon execution via email to the email address I last provided but may also request a printed copy by calling the office of [facility].    I understand that my insurance will be billed for this visit.   I have read or had this consent read to me. . I understand the contents of this consent, which adequately explains the benefits and risks of the Services being provided via telemedicine.  . I have been provided ample opportunity to ask questions regarding this consent and the Services and have had my questions answered to my satisfaction. . I give my informed consent for the services to be provided through the use of telemedicine in my medical care  By participating in this telemedicine visit I  agree to the above.

## 2018-10-10 ENCOUNTER — Encounter: Payer: Self-pay | Admitting: Cardiovascular Disease

## 2018-10-10 ENCOUNTER — Other Ambulatory Visit: Payer: Self-pay

## 2018-10-10 ENCOUNTER — Telehealth (INDEPENDENT_AMBULATORY_CARE_PROVIDER_SITE_OTHER): Payer: Medicare Other | Admitting: Cardiovascular Disease

## 2018-10-10 VITALS — Ht 69.0 in

## 2018-10-10 DIAGNOSIS — E782 Mixed hyperlipidemia: Secondary | ICD-10-CM

## 2018-10-10 DIAGNOSIS — I482 Chronic atrial fibrillation, unspecified: Secondary | ICD-10-CM

## 2018-10-10 DIAGNOSIS — I4819 Other persistent atrial fibrillation: Secondary | ICD-10-CM | POA: Diagnosis not present

## 2018-10-10 DIAGNOSIS — E785 Hyperlipidemia, unspecified: Secondary | ICD-10-CM | POA: Diagnosis not present

## 2018-10-10 DIAGNOSIS — Z7189 Other specified counseling: Secondary | ICD-10-CM

## 2018-10-10 DIAGNOSIS — Z7901 Long term (current) use of anticoagulants: Secondary | ICD-10-CM | POA: Diagnosis not present

## 2018-10-10 DIAGNOSIS — Z7982 Long term (current) use of aspirin: Secondary | ICD-10-CM

## 2018-10-10 NOTE — Patient Instructions (Signed)
Medication Instructions:  Your physician has recommended you make the following change in your medication:  STOP Aspirin  If you need a refill on your cardiac medications before your next appointment, please call your pharmacy.    Lab work: None Ordered    Testing/Procedures: None Ordered   Follow-Up: At [facility], you and your health needs are our priority.  As part of our continuing mission to provide you with exceptional heart care, we have created designated Provider Care Teams.  These Care Teams include your primary Cardiologist (physician) and Advanced Practice Providers (APPs -  Physician Assistants and Nurse Practitioners) who all work together to provide you with the care you need, when you need it. You will need a follow up appointment in:  6 months.  Please call our office 2 months in advance to schedule this appointment.  You may see Mertie Moores, MD or one of the following Advanced Practice Providers on your designated Care Team: Richardson Dopp, PA-C [place], [place] . Daune Perch, NP

## 2018-10-10 NOTE — Progress Notes (Signed)
Virtual Visit via Telephone Note   This visit type was conducted due to national recommendations for restrictions regarding the COVID-19 Pandemic (e.g. social distancing) in an effort to limit this patient's exposure and mitigate transmission in our community.  Due to his co-morbid illnesses, this patient is at least at moderate risk for complications without adequate follow up.  This format is felt to be most appropriate for this patient at this time.  The patient did not have access to video technology/had technical difficulties with video requiring transitioning to audio format only (telephone).  All issues noted in this document were discussed and addressed.  No physical exam could be performed with this format.  Please refer to the patient's chart for his  consent to telehealth for [facility].   Date:  10/10/2018   ID:  Christian Maxwell, DOB [birth year], MRN [id number]  Patient Location: Home Provider Location: Home  PCP:  Ria Bush, MD  Cardiologist:  Mertie Moores, MD  Electrophysiologist:  None   Problem List 1 . Atrial fibrillation 2. Hyperlipidemia 3. Premature ventricular contractions 4. Hypothyroidism   Christian Maxwell is a 85 year old gentleman with a history of atrial fibrillation, hyperlipidemia, and a history of PVCs. He exercises on a regular basis. He has not had any episodes of chest pain or shortness of breath. He denies any syncope or presyncope.  He's doing much better. He was having some shortness of breath earlier in the year and the end of last year.  Dec. 4, 2013 :  He presents today with some right sided chest wall pain. The discomfort increases with deep breath, right arm movement and with twisting of his torso. No dyspnea. No angina.   May 22, 2011: He's doing very well at this point. He is no longer having any episodes of chest wall pain. He still rides a stationary bike for 30 minutes twice a day and does not have any episodes of pain.   Feb. 5, 2015:  Christian Maxwell has no complaints. He was seen with his wife. It is clear that he is gradually getting more and more demented. His wife states that he breathes heavily when climbing stairs. He's able to do all of his normal activities without any significant difficulties. He does notice that he'll typically develop some chest pain that she's late taking his second metoprolol dose of the day.   Sep 30, 2013:  [name] is doing well. Stays busy.   Oct. 5, 2015:   Herberth continues to have DOE. His wife says that he has lots of dyspnea - he denies it.    Sep 28, 2014:  Christian Maxwell is a 85 y.o. male who presents for atrial fib .  He has had more dyspnea and some chest pain  - especially with exertion.  Has difficulty describing his chest pain .   We have suggested a stress test in the past.  He now agrees to do the test.   Feb.   10 , 2017: Doing well. No complaints.  Saw Richardson Dopp last month. Echo showed normal LV function.   Has marked LAE.   Trace MR, moderate TR .   Oct. 5 2017:  Christian Maxwell is doing well. He's not having any episodes of chest pain or shortness of breath. He has been taking his medications.  04/14/2016:   Christian Maxwell is seen today for follow-up of his chronic atrial fibrillation and chronic episodes of chest discomfort.  He was seen in the emergency room on November 20 with  episode of chest pain. He had a [place] study earlier this week which was normal. He has no ischemia.  EF is 60%.  Chest pain  is feeling better.  Oct 03, 2016:  Christian Maxwell has had a rough several months His wife, [name], died of a CVA.  His brother and sister both died recent.   Doing ok from a cardiac standpoint.  No CP     Evaluation Performed:  Follow-Up Visit  Chief Complaint:  Atrial fib   Oct 10, 2018    Christian Maxwell is a 85 y.o. male with atrial fib  He was admitted to the hospital from September 11 through September 19 with an  intracranial hemorrhage.  The aspirin and Coumadin were held.  He had a history of falling frequently 2 months prior to the intracranial hemorrhage.  He was seen by neurology in October, 2019.  His intracranial hematoma had resolved.  Eliquis was started at that time.  Is doing better.   No recent falls Walking without a cane or walker .  Is no longer driving  No cp , no dyspnea Has home healthcare, daily 10 am - 2 pm No VS available today  Eats at [employer] for lunch. BP and honey sandwich for dinner Walks around in his house,   The patient does not have symptoms concerning for COVID-19 infection (fever, chills, cough, or new shortness of breath).    Past Medical History:  Diagnosis Date  . Abscess 7/13   abd abscess  . Cancer [facility])    colon cancer  . Chronic atrial fibrillation   . History of colon cancer 2000   T3N0 s/p colectomy  . History of echocardiogram    a. Echo 2/13:  Mild LVH, EF 55-60%, mild MR, moderate LAE, mild to moderate RAE;  b. Echo 1/17: mild LVH, vigorous LVF, EF 65-70%, no RWMA, trivial AI, trivial MR, severe LAE, mild RAE, mod TR, PASP 32 mmHg  . History of stress test    a. Cardiolite in 9/04: EF 67%, no ischemia.;  b. Myoview 6/16: no ischemia   . Hx of adenomatous colonic polyps   . Hyperlipidemia   . Hypertension   . METHICILLIN RESISTANT STAPHYLOCOCCUS AUREUS INFECTION 12/12/2006   Annotation: stitch abcess in lower abdomen after colon cancer resection Qualifier: Diagnosis of  By: [name] MD, [name]    . Myocardial infarction ([place]) 1985  . Postoperative stitch abscess 07/19/2011  . PVC's (premature ventricular contractions)   . Syncope and collapse    pt denies  . Thyroid disease    followed by [name]   Past Surgical History:  Procedure Laterality Date  . COLECTOMY  2000   6 inches removed  . COLONOSCOPY  2009   polyp, h/o cancer [name])  . EYE SURGERY     cataract surgery both eyes- pts ates he has never had cataract surgery 10-26-15  .  HERNIA REPAIR  [id number]/6282   DB umbilical hernia  . INCISE AND DRAIN ABCESS  2009,2010,2011,2012  . INCISION AND DRAINAGE Right 04/02/2013   Procedure: INCISION AND DRAINAGE RIGHT KNEE HEMATOMA;  Surgeon: [name], MD;  Location: [place];  Service: Orthopedics;  Laterality: Right;  . INCISION AND DRAINAGE ABSCESS N/A 09/20/2012   Procedure: INCISION AND DRAINAGE SUPRAPUBIC ABSCESS;  Surgeon: [name], MD;  Location: [place];  Service: General;  Laterality: N/A;  . INCISION AND DRAINAGE ABSCESS Left 02/28/2013   Procedure: INCISION AND DRAINAGE LEFT ARM ABSCESS;  Surgeon: [name], MD;  Location: [place];  Service: General;  Laterality: Left;  . LUMBAR LAMINECTOMY  1977  . MASS EXCISION  03/07/2012   EXCISION MASS;  Surgeon: [name], MD;  Laterality: Right;  removal mass posterior right arm  . POLYPECTOMY    . WOUND EXPLORATION  2006   For possible stitch abscess Dr [name]     Current Meds  Medication Sig  . acetaminophen (TYLENOL) 325 MG tablet Take 2 tablets (650 mg total) by mouth every 4 (four) hours as needed for headache or mild pain.  [name] ALPRAZolam (XANAX) 0.25 MG tablet Take 1 tablet (0.25 mg total) by mouth 2 (two) times daily as needed for anxiety.  . Artificial Tear Ointment (DRY EYES OP) Apply 1 drop to eye as needed (dry eyes).  [name] atorvastatin (LIPITOR) 40 MG tablet Take 1 tablet (40 mg total) by mouth daily at 6 PM.  . donepezil (ARICEPT) 10 MG tablet Take 10 mg by mouth at bedtime.  [name] ELIQUIS 5 MG TABS tablet TAKE 1 TABLET BY MOUTH TWICE DAILY  . metoprolol tartrate (LOPRESSOR) 25 MG tablet Take 25 mg by mouth 2 (two) times daily.  [name] zolpidem (AMBIEN) 10 MG tablet Take 1 tablet (10 mg total) by mouth at bedtime as needed for sleep.     Allergies:   Patient has no known allergies.   Social History   Tobacco Use  . Smoking status: Former Smoker    Last attempt to quit: 10/13/1977    Years since quitting: 41.0  . Smokeless  tobacco: Former Systems developer    Types: [place] date: 10/13/1977  Substance Use Topics  . Alcohol use: No    Alcohol/week: 0.0 standard drinks  . Drug use: No     Family Hx: The patient's family history includes CAD (age of onset: 58) in his brother; CAD (age of onset: 71) in his father; Colon cancer in his cousin; Dementia in his brother and father; Dementia (age of onset: 73) in his sister; Diabetes in his brother; Hypertension in his father; Stroke in his brother, brother, father, and mother; Stroke (age of onset: 57) in his sister. There is no history of Cancer, Colon polyps, Rectal cancer, or Stomach cancer.  ROS:   Please see the history of present illness.     All other systems reviewed and are negative.   Prior CV studies:   The following studies were reviewed today:    Labs/Other Tests and Data Reviewed:    EKG:  No ECG reviewed.  Recent Labs: 01/21/2018: B Natriuretic Peptide 190.4 01/28/2018: ALT 32 07/11/2018: BUN 15; Creatinine, Ser 0.94; Hemoglobin 15.8; Platelets 184; Potassium 4.6; Sodium 140   Recent Lipid Panel Lab Results  Component Value Date/Time   CHOL 216 (H) 01/22/2018 06:42 AM   CHOL 147 10/04/2016 08:01 AM   TRIG 138 01/22/2018 06:42 AM   HDL 45 01/22/2018 06:42 AM   HDL 41 10/04/2016 08:01 AM   CHOLHDL 4.8 01/22/2018 06:42 AM   LDLCALC 143 (H) 01/22/2018 06:42 AM   LDLCALC 71 10/04/2016 08:01 AM    Wt Readings from Last 3 Encounters:  07/11/18 192 lb (87.1 kg)  03/28/18 184 lb (83.5 kg)  03/04/18 180 lb 6.4 oz (81.8 kg)     Objective:    Vital Signs:  Ht 5\' 9"  (1.753 m)   BMI 28.35 kg/m    No exam   ASSESSMENT & PLAN:    1.   Persistent Atrial  Fib:   On eliquis.   Also takes ASA.  Will DC ASA . Overall doing well Cont current meds  2.  hyperlipidemia :  Labs in sept, 2019 -  Chol = 216 HDL = 45 LDL = 143 Trigs = 138  Continue diet ,  ( limited diet since he does not cook for himself)   COVID-19 Education: The signs and  symptoms of COVID-19 were discussed with the patient and how to seek care for testing (follow up with PCP or arrange E-visit).  The importance of social distancing was discussed today.  Time:   Today, I have spent  18  minutes with the patient with telehealth technology discussing the above problems.     Medication Adjustments/Labs and Tests Ordered: Current medicines are reviewed at length with the patient today.  Concerns regarding medicines are outlined above.   Tests Ordered: No orders of the defined types were placed in this encounter.   Medication Changes: No orders of the defined types were placed in this encounter.   Disposition:  Follow up in 6 month(s)  Signed, Mertie Moores, MD  10/10/2018 9:32 AM    [facility]

## 2018-10-17 ENCOUNTER — Other Ambulatory Visit: Payer: Self-pay | Admitting: Cardiovascular Disease

## 2018-10-17 NOTE — Telephone Encounter (Signed)
OV 10/10/2018 Scr 0.94 on 07/11/2018 Age 85 Weight 87.1kg eliquis 5mg  BID sent to pharmacy

## 2018-11-21 ENCOUNTER — Other Ambulatory Visit: Payer: Self-pay | Admitting: Physician Assistant

## 2019-01-06 DIAGNOSIS — E785 Hyperlipidemia, unspecified: Secondary | ICD-10-CM | POA: Diagnosis not present

## 2019-01-06 DIAGNOSIS — R740 Nonspecific elevation of levels of transaminase and lactic acid dehydrogenase [LDH]: Secondary | ICD-10-CM | POA: Diagnosis not present

## 2019-01-06 DIAGNOSIS — F0280 Dementia in other diseases classified elsewhere without behavioral disturbance: Secondary | ICD-10-CM | POA: Diagnosis not present

## 2019-01-06 DIAGNOSIS — I2510 Atherosclerotic heart disease of native coronary artery without angina pectoris: Secondary | ICD-10-CM | POA: Diagnosis not present

## 2019-01-06 DIAGNOSIS — I779 Disorder of arteries and arterioles, unspecified: Secondary | ICD-10-CM | POA: Diagnosis not present

## 2019-01-06 DIAGNOSIS — E0500 Thyrotoxicosis with diffuse goiter without thyrotoxic crisis or storm: Secondary | ICD-10-CM | POA: Diagnosis not present

## 2019-01-06 DIAGNOSIS — I618 Other nontraumatic intracerebral hemorrhage: Secondary | ICD-10-CM | POA: Diagnosis not present

## 2019-01-06 DIAGNOSIS — S32009S Unspecified fracture of unspecified lumbar vertebra, sequela: Secondary | ICD-10-CM | POA: Diagnosis not present

## 2019-01-06 DIAGNOSIS — I480 Paroxysmal atrial fibrillation: Secondary | ICD-10-CM | POA: Diagnosis not present

## 2019-01-06 DIAGNOSIS — C189 Malignant neoplasm of colon, unspecified: Secondary | ICD-10-CM | POA: Diagnosis not present

## 2019-01-06 DIAGNOSIS — D1803 Hemangioma of intra-abdominal structures: Secondary | ICD-10-CM | POA: Diagnosis not present

## 2019-01-06 DIAGNOSIS — C439 Malignant melanoma of skin, unspecified: Secondary | ICD-10-CM | POA: Diagnosis not present

## 2019-02-10 ENCOUNTER — Other Ambulatory Visit: Payer: Self-pay | Admitting: Family Medicine

## 2019-02-10 NOTE — Telephone Encounter (Signed)
LOV was in 2018. Also there is a note in 2018 that patient moved and found another MD to follow. In 2019 there are notes for therapy for Dr g to approve. Patient needs an appointment. RX denied and pharmacy advised.

## 2019-04-18 ENCOUNTER — Other Ambulatory Visit: Payer: Self-pay | Admitting: Cardiovascular Disease

## 2019-04-18 NOTE — Telephone Encounter (Signed)
Prescription refill request for Eliquis received.  Last office visit: Nahser, 10/10/2018 Scr: 0.94, 07/11/2018 Age: 85 y.o. Weight: 87.1 kg   Prescription refill sent.

## 2019-05-26 ENCOUNTER — Other Ambulatory Visit: Payer: Self-pay | Admitting: Physician Assistant

## 2019-06-14 ENCOUNTER — Ambulatory Visit: Payer: Medicare Other

## 2019-06-21 ENCOUNTER — Ambulatory Visit: Payer: Medicare Other

## 2019-06-23 DIAGNOSIS — I480 Paroxysmal atrial fibrillation: Secondary | ICD-10-CM | POA: Diagnosis not present

## 2019-06-23 DIAGNOSIS — F0280 Dementia in other diseases classified elsewhere without behavioral disturbance: Secondary | ICD-10-CM | POA: Diagnosis not present

## 2019-06-23 DIAGNOSIS — R7401 Elevation of levels of liver transaminase levels: Secondary | ICD-10-CM | POA: Diagnosis not present

## 2019-06-23 DIAGNOSIS — I779 Disorder of arteries and arterioles, unspecified: Secondary | ICD-10-CM | POA: Diagnosis not present

## 2019-06-23 DIAGNOSIS — E0500 Thyrotoxicosis with diffuse goiter without thyrotoxic crisis or storm: Secondary | ICD-10-CM | POA: Diagnosis not present

## 2019-06-23 DIAGNOSIS — C189 Malignant neoplasm of colon, unspecified: Secondary | ICD-10-CM | POA: Diagnosis not present

## 2019-06-23 DIAGNOSIS — I618 Other nontraumatic intracerebral hemorrhage: Secondary | ICD-10-CM | POA: Diagnosis not present

## 2019-06-23 DIAGNOSIS — Z7901 Long term (current) use of anticoagulants: Secondary | ICD-10-CM | POA: Diagnosis not present

## 2019-06-23 DIAGNOSIS — E785 Hyperlipidemia, unspecified: Secondary | ICD-10-CM | POA: Diagnosis not present

## 2019-07-05 ENCOUNTER — Ambulatory Visit: Payer: Medicare Other

## 2019-07-05 ENCOUNTER — Ambulatory Visit: Payer: Medicare Other | Attending: Internal Medicine

## 2019-07-05 DIAGNOSIS — Z23 Encounter for immunization: Secondary | ICD-10-CM | POA: Insufficient documentation

## 2019-07-05 NOTE — Progress Notes (Signed)
   Covid-19 Vaccination Clinic  Name:  Christian Maxwell    MRN: [id number] DOB: 1932-07-31  07/05/2019  Christian Maxwell was observed post Covid-19 immunization for 15 minutes without incidence. He was provided with Vaccine Information Sheet and instruction to access the V-Safe system.   Christian Maxwell was instructed to call 911 with any severe reactions post vaccine: [name] Difficulty breathing  . Swelling of your face and throat  . A fast heartbeat  . A bad rash all over your body  . Dizziness and weakness    Immunizations Administered    Name Date Dose VIS Date Route   Pfizer COVID-19 Vaccine 07/05/2019 12:54 PM 0.3 mL 04/25/2019 Intramuscular   Manufacturer: [place]   Lot: [id number]   [place]: [id number]

## 2019-07-29 ENCOUNTER — Ambulatory Visit: Payer: Medicare Other | Attending: Internal Medicine

## 2019-07-29 DIAGNOSIS — Z23 Encounter for immunization: Secondary | ICD-10-CM

## 2019-07-29 NOTE — Progress Notes (Signed)
   Covid-19 Vaccination Clinic  Name:  JAQUARION SPOFFORD    MRN: [id number] DOB: 1933/03/25  07/29/2019  Mr. Pickford was observed post Covid-19 immunization for 15 minutes without incident. He was provided with Vaccine Information Sheet and instruction to access the V-Safe system.   Mr. Switzer was instructed to call 911 with any severe reactions post vaccine: [name] Difficulty breathing  . Swelling of face and throat  . A fast heartbeat  . A bad rash all over body  . Dizziness and weakness   Immunizations Administered    Name Date Dose VIS Date Route   Pfizer COVID-19 Vaccine 07/29/2019 12:02 PM 0.3 mL 04/25/2019 Intramuscular   Manufacturer: [place]   Lot: [id number]   [place]: [id number]

## 2019-10-04 ENCOUNTER — Emergency Department (HOSPITAL_COMMUNITY)
Admission: EM | Admit: 2019-10-04 | Discharge: 2019-10-04 | Disposition: A | Discharge status: Home / Self Care | Payer: Medicare Other | Attending: Emergency Medicine | Admitting: Emergency Medicine

## 2019-10-04 ENCOUNTER — Other Ambulatory Visit: Payer: Self-pay

## 2019-10-04 DIAGNOSIS — I252 Old myocardial infarction: Secondary | ICD-10-CM | POA: Insufficient documentation

## 2019-10-04 DIAGNOSIS — I10 Essential (primary) hypertension: Secondary | ICD-10-CM | POA: Diagnosis not present

## 2019-10-04 DIAGNOSIS — Z79899 Other long term (current) drug therapy: Secondary | ICD-10-CM | POA: Diagnosis not present

## 2019-10-04 DIAGNOSIS — F0390 Unspecified dementia without behavioral disturbance: Secondary | ICD-10-CM | POA: Insufficient documentation

## 2019-10-04 DIAGNOSIS — R040 Epistaxis: Secondary | ICD-10-CM | POA: Insufficient documentation

## 2019-10-04 DIAGNOSIS — Z87891 Personal history of nicotine dependence: Secondary | ICD-10-CM | POA: Insufficient documentation

## 2019-10-04 DIAGNOSIS — Z7901 Long term (current) use of anticoagulants: Secondary | ICD-10-CM | POA: Diagnosis not present

## 2019-10-04 DIAGNOSIS — Z85038 Personal history of other malignant neoplasm of large intestine: Secondary | ICD-10-CM | POA: Diagnosis not present

## 2019-10-04 MED ORDER — OXYMETAZOLINE HCL 0.05 % NA SOLN
1.0000 | Freq: Once | NASAL | Status: AC
  Administered 2019-10-04: 1 via NASAL

## 2019-10-04 MED ORDER — OXYMETAZOLINE HCL 0.05 % NA SOLN
1.0000 | Freq: Once | NASAL | Status: AC
  Administered 2019-10-04: 1 via NASAL
  Filled 2019-10-04: qty 30

## 2019-10-04 NOTE — ED Triage Notes (Signed)
Pt to ed with c/o of nose bleed that will not stop. Pt does take coumadin for hx of afib, Mi, HTN and colon cancer. Pt is A&O x4.

## 2019-10-04 NOTE — ED Provider Notes (Signed)
[place] DEPT Provider Note   CSN: [id number] Arrival date & time: 10/04/19  1015     History Chief Complaint  Patient presents with  . Epistaxis    Christian Maxwell is a 86 y.o. male.  He has history of colon cancer and A. fib brain bleed on anticoagulation here with complaint of nosebleed.  Said it started this morning.  Could not get it stopped.  Denies any other symptoms.  The history is provided by the patient and the EMS personnel.  Epistaxis Location:  L nare Severity:  Moderate Timing:  Constant Progression:  Resolved Chronicity:  New Context: anticoagulants and hypertension   Relieved by:  None tried Worsened by:  Nothing Ineffective treatments:  Applying pressure Associated symptoms: blood in oropharynx   Associated symptoms: no cough, no fever, no headaches, no sore throat and no syncope        Past Medical History:  Diagnosis Date  . Abscess 7/13   abd abscess  . Cancer [facility])    colon cancer  . Chronic atrial fibrillation   . History of colon cancer 2000   T3N0 s/p colectomy  . History of echocardiogram    a. Echo 2/13:  Mild LVH, EF 55-60%, mild MR, moderate LAE, mild to moderate RAE;  b. Echo 1/17: mild LVH, vigorous LVF, EF 65-70%, no RWMA, trivial AI, trivial MR, severe LAE, mild RAE, mod TR, PASP 32 mmHg  . History of stress test    a. Cardiolite in 9/04: EF 67%, no ischemia.;  b. Myoview 6/16: no ischemia   . Hx of adenomatous colonic polyps   . Hyperlipidemia   . Hypertension   . METHICILLIN RESISTANT STAPHYLOCOCCUS AUREUS INFECTION 12/12/2006   Annotation: stitch abcess in lower abdomen after colon cancer resection Qualifier: Diagnosis of  By: [name] MD, [name]    . Myocardial infarction ([place]) 1985  . Postoperative stitch abscess 07/19/2011  . PVC's (premature ventricular contractions)   . Syncope and collapse    pt denies  . Thyroid disease    followed by [name]    Patient Active Problem List   Diagnosis Date Noted  . Benign essential HTN   . Sleep disturbance   . SOB (shortness of breath)   . Transaminitis   . Prediabetes   . Thalamic hemorrhage ([place]) 01/23/2018  . Cerebral hemorrhage ([place])   . Essential hypertension   . Dyslipidemia   . Dementia without behavioral disturbance ([place]) 01/22/2018  . Ataxia 01/21/2018  . Bleeding in brain ([place]) 01/21/2018  . Hypoxia 01/21/2018  . Chest pain 01/20/2018  . Hearing impairment 11/11/2015  . Olecranon bursitis of left elbow 11/11/2015  . LBBB (left bundle branch block) 05/27/2015  . Senile purpura ([place]) 02/22/2015  . Wound, open, arm, forearm 02/22/2015  . Advanced care planning/counseling discussion 11/06/2014  . Medicare annual wellness visit, initial 11/06/2014  . Bilateral knee pain 03/17/2014  . DOE (dyspnea on exertion) 02/16/2014  . Memory impairment 03/06/2013  . MRSA colonization 12/12/2012  . Nevus of sternal region 12/04/2012  . Seasonal allergic rhinitis 10/22/2012  . Thyroid disease   . Suprapubic abscess 08/21/2012  . H/O colon cancer, stage I 06/24/2012  . Adenomatous colon polyp 06/24/2012  . Open wound-left arm and suprapubic area. 03/11/2012  . Chest discomfort 04/21/2011  . Hyperlipidemia 10/14/2010  . Atrial fibrillation ([place]) 08/08/2010  . INSOMNIA, MILD 12/12/2006    Past Surgical History:  Procedure Laterality Date  . COLECTOMY  2000   6  inches removed  . COLONOSCOPY  2009   polyp, h/o cancer [name])  . EYE SURGERY     cataract surgery both eyes- pts ates he has never had cataract surgery 10-26-15  . HERNIA REPAIR  [id number]   DB umbilical hernia  . INCISE AND DRAIN ABCESS  2009,2010,2011,2012  . INCISION AND DRAINAGE Right 04/02/2013   Procedure: INCISION AND DRAINAGE RIGHT KNEE HEMATOMA;  Surgeon: [name], MD;  Location: [place];  Service: Orthopedics;  Laterality: Right;  . INCISION AND DRAINAGE ABSCESS N/A 09/20/2012   Procedure: INCISION AND DRAINAGE SUPRAPUBIC ABSCESS;   Surgeon: [name], MD;  Location: [place];  Service: General;  Laterality: N/A;  . INCISION AND DRAINAGE ABSCESS Left 02/28/2013   Procedure: INCISION AND DRAINAGE LEFT ARM ABSCESS;  Surgeon: [name], MD;  Location: [place];  Service: General;  Laterality: Left;  . LUMBAR LAMINECTOMY  1977  . MASS EXCISION  03/07/2012   EXCISION MASS;  Surgeon: [name], MD;  Laterality: Right;  removal mass posterior right arm  . POLYPECTOMY    . WOUND EXPLORATION  2006   For possible stitch abscess Dr [name]       Family History  Problem Relation Age of Onset  . Stroke Mother   . Stroke Father   . Hypertension Father   . CAD Father 73       MI  . Dementia Father   . Stroke Sister 35  . Stroke Brother   . Stroke Brother   . Diabetes Brother   . CAD Brother 64       MI  . Dementia Sister 70  . Dementia Brother   . Colon cancer Cousin        x4 total with colon cancer  . Cancer Neg Hx   . Colon polyps Neg Hx   . Rectal cancer Neg Hx   . Stomach cancer Neg Hx     Social History   Tobacco Use  . Smoking status: Former Smoker    Quit date: 10/13/1977    Years since quitting: 42.0  . Smokeless tobacco: Former Systems developer    Types: [place] date: 10/13/1977  Substance Use Topics  . Alcohol use: No    Alcohol/week: 0.0 standard drinks  . Drug use: No    Home Medications Prior to Admission medications   Medication Sig Start Date End Date Taking? Authorizing Provider  acetaminophen (TYLENOL) 325 MG tablet Take 2 tablets (650 mg total) by mouth every 4 (four) hours as needed for headache or mild pain. 01/31/18   Angiulli, Lavon Paganini, PA-C  ALPRAZolam Duanne Moron) 0.25 MG tablet Take 1 tablet (0.25 mg total) by mouth 2 (two) times daily as needed for anxiety. 01/31/18   Angiulli, Lavon Paganini, PA-C  Artificial Tear Ointment (DRY EYES OP) Apply 1 drop to eye as needed (dry eyes).    [provider]  atorvastatin (LIPITOR) 40 MG tablet Take 1 tablet (40 mg  total) by mouth daily at 6 PM. 01/31/18   Angiulli, Lavon Paganini, PA-C  donepezil (ARICEPT) 10 MG tablet Take 10 mg by mouth at bedtime.    [provider]  ELIQUIS 5 MG TABS tablet TAKE 1 TABLET BY MOUTH TWICE DAILY 04/18/19   Nahser, Wonda Cheng, MD  metoprolol tartrate (LOPRESSOR) 25 MG tablet Take 1 tablet (25 mg total) by mouth 2 (two) times daily. Please make yearly appt with Dr. Acie Fredrickson for May before anymore refills. 1st  attempt 05/27/19   Richardson Dopp T, PA-C  zolpidem (AMBIEN) 10 MG tablet Take 1 tablet (10 mg total) by mouth at bedtime as needed for sleep. 01/31/18   Angiulli, Lavon Paganini, PA-C    Allergies    Patient has no known allergies.  Review of Systems   Review of Systems  Constitutional: Negative for fever.  HENT: Positive for nosebleeds. Negative for sore throat.   Eyes: Negative for visual disturbance.  Respiratory: Negative for cough and shortness of breath.   Cardiovascular: Negative for chest pain and syncope.  Gastrointestinal: Negative for abdominal pain.  Genitourinary: Negative for dysuria.  Musculoskeletal: Negative for neck pain.  Skin: Negative for rash.  Neurological: Negative for headaches.    Physical Exam Updated Vital Signs BP 113/78 (BP Location: Right Arm)   Pulse 71   Temp 98.5 F (36.9 C) (Oral)   Resp 16   Ht 5\' 8"  (1.727 m)   Wt 86.2 kg   SpO2 95%   BMI 28.89 kg/m   Physical Exam Vitals and nursing note reviewed.  Constitutional:      Appearance: He is well-developed.  HENT:     Head: Normocephalic and atraumatic.     Nose:     Comments: Has some dried blood in his left nares.  No active bleeding.    Mouth/Throat:     Mouth: Mucous membranes are moist.     Pharynx: Oropharynx is clear.  Eyes:     Conjunctiva/sclera: Conjunctivae normal.  Cardiovascular:     Rate and Rhythm: Normal rate. Rhythm irregular.     Heart sounds: No murmur.  Pulmonary:     Effort: Pulmonary effort is normal. No respiratory distress.     Breath  sounds: Normal breath sounds.  Abdominal:     Palpations: Abdomen is soft.     Tenderness: There is no abdominal tenderness.  Musculoskeletal:        General: No deformity or signs of injury. Normal range of motion.     Cervical back: Neck supple.  Skin:    General: Skin is warm and dry.     Capillary Refill: Capillary refill takes less than 2 seconds.  Neurological:     General: No focal deficit present.     Mental Status: He is alert.     ED Results / Procedures / Treatments   Labs (all labs ordered are listed, but only abnormal results are displayed) Labs Reviewed - No data to display  EKG None  Radiology No results found.  Procedures .Epistaxis Management  Date/Time: 10/04/2019 1:10 PM Performed by: Hayden Rasmussen, MD Authorized by: Hayden Rasmussen, MD   Consent:    Consent obtained:  Verbal   Consent given by:  Patient   Risks discussed:  Bleeding and pain   Alternatives discussed:  No treatment and referral Anesthesia (see MAR for exact dosages):    Anesthesia method:  None Procedure details:    Treatment site:  L anterior   Repair method: nasal pleget.   Treatment complexity:  Limited   Treatment episode: initial   Post-procedure details:    Assessment:  Bleeding stopped   Patient tolerance of procedure:  Tolerated well, no immediate complications   (including critical care time)  Medications Ordered in ED Medications  oxymetazoline (AFRIN) 0.05 % nasal spray 1 spray (1 spray Each Nare Given 10/04/19 1035)    ED Course  I have reviewed the triage vital signs and the nursing notes.  Pertinent labs & imaging results  that were available during my care of the patient were reviewed by me and considered in my medical decision making (see chart for details).  Differential diagnosis includes nasal trauma, anterior epistaxis, hypercoagulable, posterior bleed, septal hematoma  Clinical Course as of Oct 04 1306  Sat Oct 04, 2019  1119 Placed  Afrin-soaked pledget and left nares.  Reevaluated and no active bleeding.   [MB]  1153 Removed packing and no active bleeding.  Patient states he is ready to go home.   [MB]    Clinical Course User Index [MB] Hayden Rasmussen, MD   MDM Rules/Calculators/A&P                      CHA2DS2/VAS Stroke Risk Points  Current as of 5 minutes ago     4 >= 2 Points: High Risk  1 - 1.99 Points: Medium Risk  0 Points: Low Risk    The patient's score has not changed in the past year.: No Change     Details    This score determines the patient's risk of having a stroke if the  patient has atrial fibrillation.       Points Metrics  0 Has Congestive Heart Failure:  No    Current as of 5 minutes ago  1 Has Vascular Disease:  Yes     Current as of 5 minutes ago  1 Has Hypertension:  Yes    Current as of 5 minutes ago  2 Age:  23    Current as of 5 minutes ago  0 Has Diabetes:  No    Current as of 5 minutes ago  0 Had Stroke:  No  Had TIA:  No  Had thromboembolism:  No    Current as of 5 minutes ago  0 Male:  No    Current as of 5 minutes ago           Final Clinical Impression(s) / ED Diagnoses Final diagnoses:  Left-sided epistaxis    Rx / DC Orders ED Discharge Orders    None       Hayden Rasmussen, MD 10/04/19 1717

## 2019-10-04 NOTE — Discharge Instructions (Addendum)
You were seen in the emergency department for a nosebleed.  If this happens again please apply pressure for 15 minutes.  If nosebleed still continues please return to the emergency department.  Stay well-hydrated.

## 2019-10-17 ENCOUNTER — Other Ambulatory Visit: Payer: Self-pay | Admitting: Cardiovascular Disease

## 2019-10-17 NOTE — Telephone Encounter (Signed)
Pt last saw Dr Acie Fredrickson 10/10/18 telemedicine Covid-19, pt is overdue for follow-up, pt has appt scheduled with Dr Acie Fredrickson for 11/14/19.  Last labs 07/11/18 Creat 0.94, pt is also overdue for labwork, placed note on upcoming appt pt needs CBC and BMP f/u Eliquis. Age 86, weight 86.2, based on specified criteria pt is on appropriate dosage of Eliquis 5mg  BID.  Will refill rx x 1 only to get pt to appt then reassess dosage based on more recent labs after OV.

## 2019-11-11 ENCOUNTER — Emergency Department (HOSPITAL_COMMUNITY)
Admission: EM | Admit: 2019-11-11 | Discharge: 2019-11-11 | Disposition: A | Discharge status: Home / Self Care | Payer: Medicare Other | Attending: Emergency Medicine | Admitting: Emergency Medicine

## 2019-11-11 ENCOUNTER — Other Ambulatory Visit: Payer: Self-pay

## 2019-11-11 ENCOUNTER — Emergency Department (HOSPITAL_COMMUNITY): Payer: Medicare Other

## 2019-11-11 ENCOUNTER — Encounter (HOSPITAL_COMMUNITY): Payer: Self-pay

## 2019-11-11 DIAGNOSIS — R040 Epistaxis: Secondary | ICD-10-CM | POA: Diagnosis present

## 2019-11-11 DIAGNOSIS — Z7901 Long term (current) use of anticoagulants: Secondary | ICD-10-CM | POA: Diagnosis not present

## 2019-11-11 DIAGNOSIS — I10 Essential (primary) hypertension: Secondary | ICD-10-CM | POA: Insufficient documentation

## 2019-11-11 DIAGNOSIS — F0390 Unspecified dementia without behavioral disturbance: Secondary | ICD-10-CM | POA: Diagnosis not present

## 2019-11-11 DIAGNOSIS — I252 Old myocardial infarction: Secondary | ICD-10-CM | POA: Diagnosis not present

## 2019-11-11 DIAGNOSIS — Z85038 Personal history of other malignant neoplasm of large intestine: Secondary | ICD-10-CM | POA: Diagnosis not present

## 2019-11-11 DIAGNOSIS — I4891 Unspecified atrial fibrillation: Secondary | ICD-10-CM | POA: Insufficient documentation

## 2019-11-11 DIAGNOSIS — Z87891 Personal history of nicotine dependence: Secondary | ICD-10-CM | POA: Insufficient documentation

## 2019-11-11 MED ORDER — SILVER NITRATE-POT NITRATE 75-25 % EX MISC
1.0000 | Freq: Once | CUTANEOUS | Status: AC
  Administered 2019-11-11: 1 via TOPICAL
  Filled 2019-11-11: qty 10

## 2019-11-11 MED ORDER — OXYMETAZOLINE HCL 0.05 % NA SOLN
1.0000 | Freq: Once | NASAL | Status: AC
  Administered 2019-11-11: 1 via NASAL
  Filled 2019-11-11: qty 30

## 2019-11-11 NOTE — Discharge Instructions (Signed)
I would recommend cutting your metoprolol dose in half.  Your heart rate did have some times where it would decrease here in the emergency department.  He did not have any lightheaded or dizziness when this happened.  You need to follow-up with your primary care provider or with cardiology for evaluation of this

## 2019-11-11 NOTE — ED Notes (Signed)
Natalie May,daughter, would like an update on her dad, [phone].

## 2019-11-11 NOTE — ED Provider Notes (Addendum)
[place] DEPT Provider Note   CSN: [id number] Arrival date & time: 11/11/19  0701    History Chief Complaint  Patient presents with  . Epistaxis    on ELiquis    JANCARLOS THRUN is a 86 y.o. male with history of chronic A. fib on Eliquis, brain bleed, MI presents for evaluation of nosebleed.  Began approximately 2 hours ago.  Feels he has bleeding of his right nares.  He has not taken anything for this.  No prior nasal surgeries.  He is not followed by ENT.  No lightheadedness, dizziness, headache, chest pain, shortness of breath.   No falls or injuries   Epistaxis Location:  R nare Severity:  Moderate Duration:  2 hours Timing:  Intermittent Progression:  Unchanged Chronicity:  Recurrent Context: anticoagulants   Context: not aspirin use, not BiPAP, not bleeding disorder, not drug use, not elevation change, not foreign body, not home oxygen, not nose picking, not recent infection, not thrombocytopenia, not trauma and not weather change   Relieved by:  Applying pressure Worsened by:  Nothing Ineffective treatments:  None tried Associated symptoms: no blood in oropharynx, no congestion, no cough, no dizziness, no facial pain, no fever, no headaches, no sinus pain, no sneezing, no sore throat and no syncope   Risk factors: allergies and frequent nosebleeds   Risk factors: no alcohol use, no change in medication, no head and neck surgery, no head and neck tumor, no intranasal steroids, no liver disease, no radiation treatment, no recent chemotherapy, no recent nasal surgery and no sinus problems       Past Medical History:  Diagnosis Date  . Abscess 7/13   abd abscess  . Cancer [facility])    colon cancer  . Chronic atrial fibrillation ([place])   . History of colon cancer 2000   T3N0 s/p colectomy  . History of echocardiogram    a. Echo 2/13:  Mild LVH, EF 55-60%, mild MR, moderate LAE, mild to moderate RAE;  b. Echo 1/17: mild LVH, vigorous LVF, EF  65-70%, no RWMA, trivial AI, trivial MR, severe LAE, mild RAE, mod TR, PASP 32 mmHg  . History of stress test    a. Cardiolite in 9/04: EF 67%, no ischemia.;  b. Myoview 6/16: no ischemia   . Hx of adenomatous colonic polyps   . Hyperlipidemia   . Hypertension   . METHICILLIN RESISTANT STAPHYLOCOCCUS AUREUS INFECTION 12/12/2006   Annotation: stitch abcess in lower abdomen after colon cancer resection Qualifier: Diagnosis of  By: [name] MD, [name]    . Myocardial infarction ([place]) 1985  . Postoperative stitch abscess 07/19/2011  . PVC's (premature ventricular contractions)   . Syncope and collapse    pt denies  . Thyroid disease    followed by [name]    Patient Active Problem List   Diagnosis Date Noted  . Benign essential HTN   . Sleep disturbance   . SOB (shortness of breath)   . Transaminitis   . Prediabetes   . Thalamic hemorrhage ([place]) 01/23/2018  . Cerebral hemorrhage ([place])   . Essential hypertension   . Dyslipidemia   . Dementia without behavioral disturbance ([place]) 01/22/2018  . Ataxia 01/21/2018  . Bleeding in brain ([place]) 01/21/2018  . Hypoxia 01/21/2018  . Chest pain 01/20/2018  . Hearing impairment 11/11/2015  . Olecranon bursitis of left elbow 11/11/2015  . LBBB (left bundle branch block) 05/27/2015  . Senile purpura ([place]) 02/22/2015  . Wound, open, arm, forearm 02/22/2015  .  Advanced care planning/counseling discussion 11/06/2014  . Medicare annual wellness visit, initial 11/06/2014  . Bilateral knee pain 03/17/2014  . DOE (dyspnea on exertion) 02/16/2014  . Memory impairment 03/06/2013  . MRSA colonization 12/12/2012  . Nevus of sternal region 12/04/2012  . Seasonal allergic rhinitis 10/22/2012  . Thyroid disease   . Suprapubic abscess 08/21/2012  . H/O colon cancer, stage I 06/24/2012  . Adenomatous colon polyp 06/24/2012  . Open wound-left arm and suprapubic area. 03/11/2012  . Chest discomfort 04/21/2011  . Hyperlipidemia 10/14/2010  . Atrial  fibrillation ([place]) 08/08/2010  . INSOMNIA, MILD 12/12/2006    Past Surgical History:  Procedure Laterality Date  . COLECTOMY  2000   6 inches removed  . COLONOSCOPY  2009   polyp, h/o cancer [name])  . EYE SURGERY     cataract surgery both eyes- pts ates he has never had cataract surgery 10-26-15  . HERNIA REPAIR  09/8097   DB umbilical hernia  . INCISE AND DRAIN ABCESS  2009,2010,2011,2012  . INCISION AND DRAINAGE Right 04/02/2013   Procedure: INCISION AND DRAINAGE RIGHT KNEE HEMATOMA;  Surgeon: [name], MD;  Location: [employer];  Service: Orthopedics;  Laterality: Right;  . INCISION AND DRAINAGE ABSCESS N/A 09/20/2012   Procedure: INCISION AND DRAINAGE SUPRAPUBIC ABSCESS;  Surgeon: [name], MD;  Location: [facility];  Service: General;  Laterality: N/A;  . INCISION AND DRAINAGE ABSCESS Left 02/28/2013   Procedure: INCISION AND DRAINAGE LEFT ARM ABSCESS;  Surgeon: [name], MD;  Location: [place] [facility];  Service: General;  Laterality: Left;  . LUMBAR LAMINECTOMY  1977  . MASS EXCISION  03/07/2012   EXCISION MASS;  Surgeon: [name], MD;  Laterality: Right;  removal mass posterior right arm  . POLYPECTOMY    . WOUND EXPLORATION  2006   For possible stitch abscess Dr [name]       Family History  Problem Relation Age of Onset  . Stroke Mother   . Stroke Father   . Hypertension Father   . CAD Father 70       MI  . Dementia Father   . Stroke Sister 1  . Stroke Brother   . Stroke Brother   . Diabetes Brother   . CAD Brother 53       MI  . Dementia Sister 24  . Dementia Brother   . Colon cancer Cousin        x4 total with colon cancer  . Cancer Neg Hx   . Colon polyps Neg Hx   . Rectal cancer Neg Hx   . Stomach cancer Neg Hx     Social History   Tobacco Use  . Smoking status: Former Smoker    Quit date: 10/13/1977    Years since quitting: 42.1  . Smokeless tobacco: Former Systems developer    Types: [place] date: 10/13/1977    Vaping Use  . Vaping Use: Never used  Substance Use Topics  . Alcohol use: No    Alcohol/week: 0.0 standard drinks  . Drug use: No    Home Medications Prior to Admission medications   Medication Sig Start Date End Date Taking? Authorizing Provider  acetaminophen (TYLENOL) 325 MG tablet Take 2 tablets (650 mg total) by mouth every 4 (four) hours as needed for headache or mild pain. 01/31/18  Yes Angiulli, Lavon Paganini, PA-C  ALPRAZolam Duanne Moron) 0.25 MG tablet Take 1 tablet (0.25 mg total) by mouth 2 (two) times  daily as needed for anxiety. 01/31/18  Yes Angiulli, Lavon Paganini, PA-C  apixaban (ELIQUIS) 5 MG TABS tablet Take 1 tablet (5 mg total) by mouth 2 (two) times daily. Overdue for follow-up and labwork.  MUST see MD for future refills. 10/17/19  Yes Nahser, Wonda Cheng, MD  Artificial Tear Ointment (DRY EYES OP) Apply 1 drop to eye as needed (dry eyes).   Yes [provider]  atorvastatin (LIPITOR) 40 MG tablet Take 1 tablet (40 mg total) by mouth daily at 6 PM. 01/31/18  Yes Angiulli, Lavon Paganini, PA-C  donepezil (ARICEPT) 10 MG tablet Take 10 mg by mouth at bedtime.   Yes [provider]  metoprolol tartrate (LOPRESSOR) 25 MG tablet Take 1 tablet (25 mg total) by mouth 2 (two) times daily. Please make yearly appt with Dr. Acie Fredrickson for May before anymore refills. 1st attempt 05/27/19  Yes Weaver, Scott T, PA-C  zolpidem (AMBIEN) 10 MG tablet Take 1 tablet (10 mg total) by mouth at bedtime as needed for sleep. 01/31/18  Yes Angiulli, Lavon Paganini, PA-C    Allergies    Patient has no known allergies.  Review of Systems   Review of Systems  Constitutional: Negative for fever.  HENT: Positive for nosebleeds. Negative for congestion, dental problem, drooling, ear discharge, ear pain, facial swelling, hearing loss, mouth sores, postnasal drip, rhinorrhea, sinus pressure, sinus pain, sneezing, sore throat, trouble swallowing and voice change.   Respiratory: Negative.  Negative for cough.    Cardiovascular: Negative.  Negative for syncope.  Gastrointestinal: Negative.   Genitourinary: Negative.   Musculoskeletal: Negative.   Skin: Negative.   Neurological: Negative for dizziness and headaches.    Physical Exam Updated Vital Signs BP 118/76   Pulse (!) 56   Temp (!) 97.5 F (36.4 C) (Oral)   Resp 20   SpO2 98%   Physical Exam Vitals and nursing note reviewed.  Constitutional:      General: He is not in acute distress.    Appearance: He is well-developed. He is not ill-appearing, toxic-appearing or diaphoretic.  HENT:     Head: Normocephalic and atraumatic.     Nose:     Right Nostril: Epistaxis present. No foreign body, septal hematoma or occlusion.     Left Nostril: No foreign body, epistaxis, septal hematoma or occlusion.     Right Turbinates: Not enlarged, swollen or pale.     Left Turbinates: Not enlarged, swollen or pale.     Right Sinus: No maxillary sinus tenderness or frontal sinus tenderness.     Left Sinus: No maxillary sinus tenderness or frontal sinus tenderness.     Comments: Slight oozing to right anterior nares.  He has dried crusted blood on his face however no lacerations or contusions to suggest trauma.    Mouth/Throat:     Mouth: Mucous membranes are moist.  Eyes:     Pupils: Pupils are equal, round, and reactive to light.  Cardiovascular:     Rate and Rhythm: Normal rate and regular rhythm.     Pulses: Normal pulses.     Heart sounds: Normal heart sounds.  Pulmonary:     Effort: Pulmonary effort is normal. No respiratory distress.     Breath sounds: Normal breath sounds.  Abdominal:     General: Bowel sounds are normal. There is no distension.     Palpations: Abdomen is soft. There is no mass.     Tenderness: There is no abdominal tenderness. There is no right CVA tenderness,  left CVA tenderness, guarding or rebound.     Hernia: No hernia is present.  Musculoskeletal:        General: No swelling, tenderness or deformity. Normal range  of motion.     Cervical back: Normal range of motion and neck supple.     Right lower leg: No edema.     Left lower leg: No edema.  Skin:    General: Skin is warm and dry.     Capillary Refill: Capillary refill takes less than 2 seconds.  Neurological:     General: No focal deficit present.     Mental Status: He is alert and oriented to person, place, and time.     ED Results / Procedures / Treatments   Labs (all labs ordered are listed, but only abnormal results are displayed) Labs Reviewed - No data to display  EKG None  Radiology CT Head Wo Contrast  Result Date: 11/11/2019 CLINICAL DATA:  86 year old male on blood thinners with recurrent epistaxis. EXAM: CT HEAD WITHOUT CONTRAST TECHNIQUE: Contiguous axial images were obtained from the base of the skull through the vertex without intravenous contrast. COMPARISON:  Brain MRI 01/22/2018.  Head CT 02/12/2018 and earlier. FINDINGS: Brain: Patchy deep white matter and deep gray matter nuclei hypodensity is greater on the left and stable. Chronic anterior temporal lobe atrophy. No midline shift, ventriculomegaly, mass effect, evidence of mass lesion, intracranial hemorrhage or evidence of cortically based acute infarction. Vascular: Minimal Calcified atherosclerosis at the skull base. Skull: Stable, negative. Sinuses/Orbits: Visualized paranasal sinuses and mastoids are stable and well pneumatized. Face CT today reported separately. Other: No acute orbit or scalp soft tissue findings. IMPRESSION: 1. No acute intracranial abnormality. 2. Stable non contrast CT appearance of chronic small vessel disease. 3. Face CT today reported separately. Electronically Signed   By: Genevie Ann M.D.   On: 11/11/2019 10:34   CT MAXILLOFACIAL WO CONTRAST  Result Date: 11/11/2019 CLINICAL DATA:  86 year old male on blood thinners with recurrent epistaxis. EXAM: CT MAXILLOFACIAL WITHOUT CONTRAST TECHNIQUE: Multidetector CT imaging of the maxillofacial structures  was performed. Multiplanar CT image reconstructions were also generated. COMPARISON:  Head CT today.  Brain MRI 01/22/2018. FINDINGS: Osseous: Osteopenia. No acute or suspicious osseous lesion identified. No acute dental finding. A few levels of cervical spine ankylosis are partially visible. Orbits: Intact orbital walls. Postoperative changes to both globes, otherwise negative orbits soft tissues. Sinuses: The nasal septum is intact with mild mostly rightward septal deviation. Minor bubbly opacity in the anterior nasal cavity. Left middle concha bullosa (normal variant). Nasal cavity mucosa otherwise unremarkable. Minimal paranasal sinus mucosal thickening, primarily in the maxillary alveolar recesses. Tympanic cavities and mastoids are well pneumatized. Soft tissues: Negative visible noncontrast larynx, pharynx, parapharyngeal spaces, retropharyngeal space (partially retropharyngeal right carotid), sublingual space, submandibular spaces, masticator and parotid spaces. No upper cervical lymphadenopathy. Limited intracranial: Reported separately today. IMPRESSION: Unremarkable noncontrast Face CT. Negative nasal cavity aside from trace bubbly opacity and mild septal deviation. Electronically Signed   By: Genevie Ann M.D.   On: 11/11/2019 10:45    Procedures .Epistaxis Management  Date/Time: 11/11/2019 11:07 AM Performed by: Nettie Elm, PA-C Authorized by: Nettie Elm, PA-C   Consent:    Consent obtained:  Verbal   Consent given by:  Patient   Risks discussed:  Bleeding, infection, nasal injury and pain   Alternatives discussed:  No treatment, delayed treatment, alternative treatment, observation and referral Anesthesia (see MAR for exact dosages):    Anesthesia  method:  None Procedure details:    Treatment site:  R anterior   Treatment method:  Silver nitrate   Treatment complexity:  Extensive   Treatment episode: recurring   Post-procedure details:    Assessment:  Bleeding stopped    Patient tolerance of procedure:  Tolerated well, no immediate complications   (including critical care time)  Medications Ordered in ED Medications  oxymetazoline (AFRIN) 0.05 % nasal spray 1 spray (1 spray Each Nare Given 11/11/19 0741)  silver nitrate applicators applicator 1 Stick (1 Stick Topical Given 11/11/19 0742)   ED Course  I have reviewed the triage vital signs and the nursing notes.  Pertinent labs & imaging results that were available during my care of the patient were reviewed by me and considered in my medical decision making (see chart for details).  86 year old male presents for evaluation of nosebleed x2 hours. He is anticoagulated on Eliquis for A. fib. Patient denies any lightheadedness or dizziness. No trauma or prior nasal surgeries. He is not followed by ENT. Seen 1 month ago for epistaxis as well. Patient with slight oozing to right anterior nares.  Will trial Afrin.  Reassess.  Patient reassessed. Had sensation of bleeding with Afrin however patient keeps taking his his nose and aggravating the area. I am able to visualize the bleed.  Will trial cautery sticks.   Discussed with the patient's daughter.  States patient has dementia and lives by self at night.  Has caregivers during the day.  She is concerned that patient's nosebleed was atraumatic injury from a fall.  Patient denies this however daughter states he does have some dementia.  She is requesting imaging of his head. Earliest ride can come get patient is 1030 am.  CT imaging without acute findings Patient ambulatory with out difficulty.  Epistaxis resolved with Afrin and nasal cautery.  Patient observed for 4 hours here in the ED without any evidence of recurrence.  Patient has had some bradycardia here in the emergency department.  He will drop with heart rate into the low 40s when he sleeps.  1 patient is awake and sitting in bed his heart rate is in the 60s.  Home health nurse did state that he took his  metoprolol prior to arrival. He is asymptomatic when he becomes bradycardic. He is refusing EKG. discussed risks of not obtaining EKG which patient voiced understanding.  He does have history of dementia however his mentation is intact enough to live on his own and only have occasional help with home health. We will have him cut his metoprolol dose and follow-up outpatient.  Did try to recontact his daughter who is his emergency contact in epic. She did not answer the phone.  I gave strict return precautions to home health nurse who will take patient home.  Discussed patient's bradycardia with attending, Dr. Melina Copa who is agreement with plan for decrease in home beta-blocker and have close follow-up outpatient  The patient has been appropriately medically screened and/or stabilized in the ED. I have low suspicion for any other emergent medical condition which would require further screening, evaluation or treatment in the ED or require inpatient management.  Patient is hemodynamically stable and in no acute distress.  Patient able to ambulate in department prior to ED.  Evaluation does not show acute pathology that would require ongoing or additional emergent interventions while in the emergency department or further inpatient treatment.  I have discussed the diagnosis with the patient and answered all questions.  Pain is been managed while in the emergency department and patient has no further complaints prior to discharge.  Patient is comfortable with plan discussed in room and is stable for discharge at this time.  I have discussed strict return precautions for returning to the emergency department.  Patient was encouraged to follow-up with PCP/specialist refer to at discharge.   Clinical Course as of Nov 10 1105  Tue Jun 29, 753  6060 86 year old male here with epistaxis.  I saw him in the past for same.  He denies any pain.  He is on anticoagulation.  Bleeding area was cauterized.  Seems to stop for  now.  We will watch for a little while and if remains hemostatic will discharge.   [MB]    Clinical Course User Index [MB] Hayden Rasmussen, MD   MDM Rules/Calculators/A&P                           Final Clinical Impression(s) / ED Diagnoses Final diagnoses:  Epistaxis    Rx / DC Orders ED Discharge Orders    None       Leyli Kevorkian A, PA-C 11/11/19 1105    Jamelle Goldston A, PA-C 11/11/19 1107    Zahriah Roes A, PA-C 11/11/19 1107    Hayden Rasmussen, MD 11/11/19 1756

## 2019-11-11 NOTE — ED Notes (Signed)
Britni, PA aware of patient's HR in 30-50s range.

## 2019-11-11 NOTE — ED Triage Notes (Signed)
Patient in from home due to a nosebleed, second time in two weeks, currently on a blood thinner, last time bleed had to be cauterized, was initially controlled with afrin, but patient unable to breath through mouth, so bleeding returned, has hx of dementia

## 2019-11-11 NOTE — ED Notes (Signed)
Patient transported to CT 

## 2019-11-11 NOTE — ED Notes (Signed)
Britni, PA at bedside.  

## 2019-11-14 ENCOUNTER — Other Ambulatory Visit: Payer: Self-pay

## 2019-11-14 ENCOUNTER — Encounter: Payer: Self-pay | Admitting: Cardiovascular Disease

## 2019-11-14 ENCOUNTER — Ambulatory Visit (INDEPENDENT_AMBULATORY_CARE_PROVIDER_SITE_OTHER): Payer: Medicare Other | Admitting: Cardiovascular Disease

## 2019-11-14 VITALS — BP 116/68 | HR 70 | Ht 68.0 in | Wt 185.6 lb

## 2019-11-14 DIAGNOSIS — Z5181 Encounter for therapeutic drug level monitoring: Secondary | ICD-10-CM | POA: Diagnosis not present

## 2019-11-14 DIAGNOSIS — I482 Chronic atrial fibrillation, unspecified: Secondary | ICD-10-CM | POA: Diagnosis not present

## 2019-11-14 DIAGNOSIS — E782 Mixed hyperlipidemia: Secondary | ICD-10-CM

## 2019-11-14 NOTE — Progress Notes (Signed)
Cardiology Office Note   Date:  11/14/2019   ID:  Christian Maxwell, DOB 07/04/1932, MRN [id number]  PCP:  Christian Bush, MD  Cardiologist:  Christian Moores, MD   Chief Complaint  Patient presents with  . Atrial Fibrillation   1 . Atrial fibrillation 2. Hyperlipidemia 3. Premature ventricular contractions 4. Hypothyroidism   Christian Maxwell is a 86 year old gentleman with a history of atrial fibrillation, hyperlipidemia, and a history of PVCs. He exercises on a regular basis. He has not had any episodes of chest pain or shortness of breath. He denies any syncope or presyncope.  He's doing much better. He was having some shortness of breath earlier in the year and the end of last year.  Dec. 4, 2013 :  He presents today with some right sided chest wall pain. The discomfort increases with deep breath, right arm movement and with twisting of his torso. No dyspnea. No angina.   May 22, 2011: He's doing very well at this point. He is no longer having any episodes of chest wall pain. He still rides a stationary bike for 30 minutes twice a day and does not have any episodes of pain.  Feb. 5, 2015:  Christian Maxwell has no complaints. He was seen with his wife. It is clear that he is gradually getting more and more demented. His wife states that he breathes heavily when climbing stairs. He's able to do all of his normal activities without any significant difficulties. He does notice that he'll typically develop some chest pain that she's late taking his second metoprolol dose of the day.   Sep 30, 2013:  Christian Maxwell is doing well. Stays busy.   Oct. 5, 2015:   Christian Maxwell continues to have DOE. His wife says that he has lots of dyspnea - he denies it.    Sep 28, 2014:  Christian Maxwell is a 86 y.o. male who presents for atrial fib .  He has had more dyspnea and some chest pain  - especially with exertion.  Has difficulty describing his chest pain .   We have suggested a stress test  in the past.  He now agrees to do the test.   Feb.   10 , 2017: Doing well. No complaints.  Saw Christian Maxwell last month. Echo showed normal LV function.   Has marked LAE.   Trace MR, moderate TR .   Oct. 5 2017:  Christian Maxwell is doing well. He's not having any episodes of chest pain or shortness of breath. He has been taking his medications.  04/14/2016:   Christian Maxwell is seen today for follow-up of his chronic atrial fibrillation and chronic episodes of chest discomfort.  He was seen in the emergency room on November 20 with episode of chest pain. He had a [place] study earlier this week which was normal. He has no ischemia.  EF is 60%.  Chest pain  is feeling better.  Oct 03, 2016:  Christian Maxwell has had a rough several months His wife, Christian Maxwell, died of a CVA.  His brother and sister both died recent.   Doing Maxwell from a cardiac standpoint.  No CP   November 14, 2019:  Christian Maxwell is  seen today for follow-up of his chronic atrial fibrillation. Lives at home,  Has home health . , no cp , no dyspnea  Does not need a cane or walker.    Past Medical History:  Diagnosis Date  . Abscess 7/13   abd abscess  . Cancer ([place])  colon cancer  . Chronic atrial fibrillation ([place])   . History of colon cancer 2000   T3N0 s/p colectomy  . History of echocardiogram    a. Echo 2/13:  Mild LVH, EF 55-60%, mild MR, moderate LAE, mild to moderate RAE;  b. Echo 1/17: mild LVH, vigorous LVF, EF 65-70%, no RWMA, trivial AI, trivial MR, severe LAE, mild RAE, mod TR, PASP 32 mmHg  . History of stress test    a. Cardiolite in 9/04: EF 67%, no ischemia.;  b. Myoview 6/16: no ischemia   . Hx of adenomatous colonic polyps   . Hyperlipidemia   . Hypertension   . METHICILLIN RESISTANT STAPHYLOCOCCUS AUREUS INFECTION 12/12/2006   Annotation: stitch abcess in lower abdomen after colon cancer resection Qualifier: Diagnosis of  By: Christian Sima MD, Christian Maxwell    . Myocardial infarction ([place]) 1985  . Postoperative stitch abscess  07/19/2011  . PVC's (premature ventricular contractions)   . Syncope and collapse    pt denies  . Thyroid disease    followed by Christian. Forde Maxwell    Past Surgical History:  Procedure Laterality Date  . COLECTOMY  2000   6 inches removed  . COLONOSCOPY  2009   polyp, h/o cancer Christian Maxwell)  . EYE SURGERY     cataract surgery both eyes- pts ates he has never had cataract surgery 10-26-15  . HERNIA REPAIR  08/8183   DB umbilical hernia  . INCISE AND DRAIN ABCESS  2009,2010,2011,2012  . INCISION AND DRAINAGE Right 04/02/2013   Procedure: INCISION AND DRAINAGE RIGHT KNEE HEMATOMA;  Surgeon: Christian Rossetti, MD;  Location: [facility];  Service: Orthopedics;  Laterality: Right;  . INCISION AND DRAINAGE ABSCESS N/A 09/20/2012   Procedure: INCISION AND DRAINAGE SUPRAPUBIC ABSCESS;  Surgeon: Christian Jarred, MD;  Location: [facility];  Service: General;  Laterality: N/A;  . INCISION AND DRAINAGE ABSCESS Left 02/28/2013   Procedure: INCISION AND DRAINAGE LEFT ARM ABSCESS;  Surgeon: Christian Ok, MD;  Location: [facility];  Service: General;  Laterality: Left;  . LUMBAR LAMINECTOMY  1977  . MASS EXCISION  03/07/2012   EXCISION MASS;  Surgeon: Christian Jarred, MD;  Laterality: Right;  removal mass posterior right arm  . POLYPECTOMY    . WOUND EXPLORATION  2006   For possible stitch abscess Christian Maxwell     Current Outpatient Medications  Medication Sig Dispense Refill  . acetaminophen (TYLENOL) 325 MG tablet Take 2 tablets (650 mg total) by mouth every 4 (four) hours as needed for headache or mild pain.    [name] ALPRAZolam (XANAX) 0.25 MG tablet Take 1 tablet (0.25 mg total) by mouth 2 (two) times daily as needed for anxiety. 30 tablet 0  . apixaban (ELIQUIS) 5 MG TABS tablet Take 1 tablet (5 mg total) by mouth 2 (two) times daily. Overdue for follow-up and labwork.  MUST see MD for future refills. 60 tablet 0  . Artificial Tear Ointment (DRY EYES OP) Apply 1 drop to eye as needed (dry eyes).     [name] atorvastatin (LIPITOR) 40 MG tablet Take 1 tablet (40 mg total) by mouth daily at 6 PM. 30 tablet 1  . donepezil (ARICEPT) 10 MG tablet Take 10 mg by mouth at bedtime.    . metoprolol tartrate (LOPRESSOR) 25 MG tablet Take 1 tablet (25 mg total) by mouth 2 (two) times daily. Please make yearly appt with Christian. Acie Fredrickson for May before anymore refills. 1st attempt 180 tablet 1  . zolpidem (AMBIEN)  10 MG tablet Take 1 tablet (10 mg total) by mouth at bedtime as needed for sleep. 20 tablet 0   No current facility-administered medications for this visit.    Allergies:   Patient has no known allergies.    Social History:  The patient  reports that he quit smoking about 42 years ago. He quit smokeless tobacco use about 42 years ago.  His smokeless tobacco use included chew. He reports that he does not drink alcohol and does not use drugs.   Family History:  The patient's family history includes CAD (age of onset: 69) in his brother; CAD (age of onset: 87) in his father; Colon cancer in his cousin; Dementia in his brother and father; Dementia (age of onset: 26) in his sister; Diabetes in his brother; Hypertension in his father; Stroke in his brother, brother, father, and mother; Stroke (age of onset: 44) in his sister.    ROS:  Please see the history of present illness.     Physical Exam: Blood pressure 116/68, pulse 70, height 5\' 8"  (1.727 m), weight 185 lb 9.6 oz (84.2 kg), SpO2 98 %.  GEN:  Well nourished, well developed in no acute distress HEENT: Normal NECK: No JVD; No carotid bruits LYMPHATICS: No lymphadenopathy CARDIAC:   Irreg. Irreg.  RESPIRATORY:  Clear to auscultation without rales, wheezing or rhonchi  ABDOMEN: Soft, non-tender, non-distended MUSCULOSKELETAL:  No edema; No deformity  SKIN: Warm and dry NEUROLOGIC:  Alert and oriented x 3   EKG: November 14, 2019: Atrial fibrillation with a slow ventricular response of 59.  Left bundle branch block.  No changes from previous EKG.     Recent Labs: No results found for requested labs within last 8760 hours.    Lipid Panel    Component Value Date/Time   CHOL 216 (H) 01/22/2018 0642   CHOL 147 10/04/2016 0801   TRIG 138 01/22/2018 0642   HDL 45 01/22/2018 0642   HDL 41 10/04/2016 0801   CHOLHDL 4.8 01/22/2018 0642   VLDL 28 01/22/2018 0642   LDLCALC 143 (H) 01/22/2018 0642   LDLCALC 71 10/04/2016 0801      Wt Readings from Last 3 Encounters:  11/14/19 185 lb 9.6 oz (84.2 kg)  10/04/19 190 lb (86.2 kg)  07/11/18 192 lb (87.1 kg)      Other studies Reviewed: Additional studies/ records that were reviewed today include: . Review of the above records demonstrates:    ASSESSMENT AND PLAN:  1 . Atrial fibrillation- -  Chronic Afib .   HR is well controlled.   2. Hyperlipidemia - check lipds today .  Overall he is doing well   3. Premature ventricular contractions  4. Hypothyroidism -   Managed by his primary MD   Current medicines are reviewed at length with the patient today.  The patient does not have concerns regarding medicines.  The following changes have been made:  no change  Labs/ tests ordered today include:  No orders of the defined types were placed in this encounter.    Disposition:   FU with me in 1 year      Christian Moores, MD  11/14/2019 1:33 PM    [facility] [address], [place], [place]  [address] Phone: [phone]; Fax: [phone]

## 2019-11-14 NOTE — Patient Instructions (Signed)
Medication Instructions:  Your physician recommends that you continue on your current medications as directed. Please refer to the Current Medication list given to you today.  *If you need a refill on your cardiac medications before your next appointment, please call your pharmacy*   Lab Work: TODAY - cholesterol, liver panel, BMET If you have labs (blood work) drawn today and your tests are completely normal, you will receive your results only by:  [place] (if you have MyChart) OR  A paper copy in the mail If you have any lab test that is abnormal or we need to change your treatment, we will call you to review the results.   Testing/Procedures: None Ordered   Follow-Up: At [facility], you and your health needs are our priority.  As part of our continuing mission to provide you with exceptional heart care, we have created designated Provider Care Teams.  These Care Teams include your primary Cardiologist (physician) and Advanced Practice Providers (APPs -  Physician Assistants and Nurse Practitioners) who all work together to provide you with the care you need, when you need it.  We recommend signing up for the patient portal called "MyChart".  Sign up information is provided on this After Visit Summary.  MyChart is used to connect with patients for Virtual Visits (Telemedicine).  Patients are able to view lab/test results, encounter notes, upcoming appointments, etc.  Non-urgent messages can be sent to your provider as well.   To learn more about what you can do with MyChart, go to [email or web address].    Your next appointment:   1 year(s)  The format for your next appointment:   In Person  Provider:   You may see Mertie Moores, MD or one of the following Advanced Practice Providers on your designated Care Team:    Richardson Do[place], PA-C  [place], [place]

## 2019-11-15 LAB — HEPATIC FUNCTION PANEL
ALT: 23 IU/L (ref 0–44)
AST: 28 IU/L (ref 0–40)
Albumin: 4.3 g/dL (ref 3.6–4.6)
Alkaline Phosphatase: 58 IU/L (ref 48–121)
Bilirubin Total: 0.6 mg/dL (ref 0.0–1.2)
Bilirubin, Direct: 0.13 mg/dL (ref 0.00–0.40)
Total Protein: 6.5 g/dL (ref 6.0–8.5)

## 2019-11-15 LAB — LIPID PANEL
Chol/HDL Ratio: 5.6 ratio — ABNORMAL HIGH (ref 0.0–5.0)
Cholesterol, Total: 195 mg/dL (ref 100–199)
HDL: 35 mg/dL — ABNORMAL LOW (ref >39)
LDL Chol Calc (NIH): 121 mg/dL — ABNORMAL HIGH (ref 0–99)
Triglycerides: 222 mg/dL — ABNORMAL HIGH (ref 0–149)
VLDL Cholesterol Cal: 39 mg/dL (ref 5–40)

## 2019-11-15 LAB — BASIC METABOLIC PANEL
BUN/Creatinine Ratio: 15 (ref 10–24)
BUN: 13 mg/dL (ref 8–27)
CO2: 28 mmol/L (ref 20–29)
Calcium: 10.2 mg/dL (ref 8.6–10.2)
Chloride: 103 mmol/L (ref 96–106)
Creatinine, Ser: 0.88 mg/dL (ref 0.76–1.27)
GFR calc Af Amer: 91 mL/min/{1.73_m2} (ref >59)
GFR calc non Af Amer: 78 mL/min/{1.73_m2} (ref >59)
Glucose: 85 mg/dL (ref 65–99)
Potassium: 4.3 mmol/L (ref 3.5–5.2)
Sodium: 144 mmol/L (ref 134–144)

## 2019-11-18 ENCOUNTER — Other Ambulatory Visit: Payer: Self-pay | Admitting: Cardiovascular Disease

## 2019-11-18 NOTE — Telephone Encounter (Signed)
Prescription refill request for Eliquis received. Indication: Atrial Fibrillation Last office visit: 11/14/2019 Dr Acie Fredrickson Scr: 0.88 11/14/2019 Age: 86 Weight: 84.2 kg  Prescription refill sent

## 2019-11-20 ENCOUNTER — Telehealth: Payer: Self-pay

## 2019-11-20 MED ORDER — ROSUVASTATIN CALCIUM 5 MG PO TABS
5.0000 mg | ORAL_TABLET | Freq: Every day | ORAL | 3 refills | Status: DC
Start: 2019-11-20 — End: 2020-08-30

## 2019-11-20 NOTE — Telephone Encounter (Signed)
Patient agrees to start on rosuvastatin 5 mg daily.

## 2019-11-20 NOTE — Telephone Encounter (Signed)
-----   Message from Thayer Headings, MD sent at 11/20/2019  3:13 PM EDT ----- Will he consider taking rosuvastatin 5 mg a day .   I dont think he will have significant side effects.

## 2019-11-21 ENCOUNTER — Telehealth: Payer: Self-pay

## 2019-11-21 DIAGNOSIS — E782 Mixed hyperlipidemia: Secondary | ICD-10-CM

## 2019-11-21 NOTE — Telephone Encounter (Signed)
-----   Message from Thayer Headings, MD sent at 11/20/2019  6:27 PM EDT ----- Thanks.   Lets also recheck lipids, liver enz and bmp in 3 months

## 2019-12-17 ENCOUNTER — Other Ambulatory Visit: Payer: Self-pay | Admitting: Cardiovascular Disease

## 2019-12-17 NOTE — Telephone Encounter (Signed)
Pt last saw Dr Acie Fredrickson 11/14/19, last labs 11/14/19 Creat 0.88, age 86, weight 84.2kg, based on specified criteria pt is on appropriate dosage of Eliquis 5mg  BID.  Will refill rx.

## 2020-01-16 DIAGNOSIS — C189 Malignant neoplasm of colon, unspecified: Secondary | ICD-10-CM | POA: Diagnosis not present

## 2020-01-16 DIAGNOSIS — I2510 Atherosclerotic heart disease of native coronary artery without angina pectoris: Secondary | ICD-10-CM | POA: Diagnosis not present

## 2020-01-16 DIAGNOSIS — Z7901 Long term (current) use of anticoagulants: Secondary | ICD-10-CM | POA: Diagnosis not present

## 2020-01-16 DIAGNOSIS — E0500 Thyrotoxicosis with diffuse goiter without thyrotoxic crisis or storm: Secondary | ICD-10-CM | POA: Diagnosis not present

## 2020-01-16 DIAGNOSIS — G4700 Insomnia, unspecified: Secondary | ICD-10-CM | POA: Diagnosis not present

## 2020-01-16 DIAGNOSIS — E785 Hyperlipidemia, unspecified: Secondary | ICD-10-CM | POA: Diagnosis not present

## 2020-01-16 DIAGNOSIS — I480 Paroxysmal atrial fibrillation: Secondary | ICD-10-CM | POA: Diagnosis not present

## 2020-01-16 DIAGNOSIS — C439 Malignant melanoma of skin, unspecified: Secondary | ICD-10-CM | POA: Diagnosis not present

## 2020-01-16 DIAGNOSIS — R7401 Elevation of levels of liver transaminase levels: Secondary | ICD-10-CM | POA: Diagnosis not present

## 2020-01-16 DIAGNOSIS — I618 Other nontraumatic intracerebral hemorrhage: Secondary | ICD-10-CM | POA: Diagnosis not present

## 2020-01-16 DIAGNOSIS — S32009S Unspecified fracture of unspecified lumbar vertebra, sequela: Secondary | ICD-10-CM | POA: Diagnosis not present

## 2020-01-16 DIAGNOSIS — F0280 Dementia in other diseases classified elsewhere without behavioral disturbance: Secondary | ICD-10-CM | POA: Diagnosis not present

## 2020-02-20 ENCOUNTER — Other Ambulatory Visit: Payer: Medicare Other | Admitting: *Deleted

## 2020-02-20 ENCOUNTER — Other Ambulatory Visit: Payer: Self-pay

## 2020-02-20 DIAGNOSIS — E782 Mixed hyperlipidemia: Secondary | ICD-10-CM

## 2020-02-20 LAB — BASIC METABOLIC PANEL
BUN/Creatinine Ratio: 12 (ref 10–24)
BUN: 11 mg/dL (ref 8–27)
CO2: 29 mmol/L (ref 20–29)
Calcium: 10 mg/dL (ref 8.6–10.2)
Chloride: 102 mmol/L (ref 96–106)
Creatinine, Ser: 0.92 mg/dL (ref 0.76–1.27)
GFR calc Af Amer: 87 mL/min/{1.73_m2} (ref >59)
GFR calc non Af Amer: 76 mL/min/{1.73_m2} (ref >59)
Glucose: 98 mg/dL (ref 65–99)
Potassium: 4.5 mmol/L (ref 3.5–5.2)
Sodium: 141 mmol/L (ref 134–144)

## 2020-02-20 LAB — LIPID PANEL
Chol/HDL Ratio: 3 ratio (ref 0.0–5.0)
Cholesterol, Total: 118 mg/dL (ref 100–199)
HDL: 40 mg/dL (ref >39)
LDL Chol Calc (NIH): 57 mg/dL (ref 0–99)
Triglycerides: 114 mg/dL (ref 0–149)
VLDL Cholesterol Cal: 21 mg/dL (ref 5–40)

## 2020-02-20 LAB — ALT: ALT: 28 IU/L (ref 0–44)

## 2020-03-07 DIAGNOSIS — Z23 Encounter for immunization: Secondary | ICD-10-CM | POA: Diagnosis not present

## 2020-03-08 DIAGNOSIS — Z23 Encounter for immunization: Secondary | ICD-10-CM | POA: Diagnosis not present

## 2020-03-22 ENCOUNTER — Other Ambulatory Visit: Payer: Self-pay | Admitting: Physician Assistant

## 2020-04-07 ENCOUNTER — Other Ambulatory Visit: Payer: Self-pay

## 2020-04-07 ENCOUNTER — Encounter (HOSPITAL_COMMUNITY): Payer: Self-pay

## 2020-04-07 ENCOUNTER — Emergency Department (HOSPITAL_COMMUNITY)
Admission: EM | Admit: 2020-04-07 | Discharge: 2020-04-07 | Disposition: A | Discharge status: Home / Self Care | Payer: Medicare Other | Attending: Emergency Medicine | Admitting: Emergency Medicine

## 2020-04-07 DIAGNOSIS — Z87891 Personal history of nicotine dependence: Secondary | ICD-10-CM | POA: Insufficient documentation

## 2020-04-07 DIAGNOSIS — Z7901 Long term (current) use of anticoagulants: Secondary | ICD-10-CM | POA: Diagnosis not present

## 2020-04-07 DIAGNOSIS — F0390 Unspecified dementia without behavioral disturbance: Secondary | ICD-10-CM | POA: Insufficient documentation

## 2020-04-07 DIAGNOSIS — I10 Essential (primary) hypertension: Secondary | ICD-10-CM | POA: Diagnosis not present

## 2020-04-07 DIAGNOSIS — Z79899 Other long term (current) drug therapy: Secondary | ICD-10-CM | POA: Diagnosis not present

## 2020-04-07 DIAGNOSIS — R040 Epistaxis: Secondary | ICD-10-CM | POA: Diagnosis not present

## 2020-04-07 DIAGNOSIS — R5381 Other malaise: Secondary | ICD-10-CM | POA: Diagnosis not present

## 2020-04-07 DIAGNOSIS — Z85038 Personal history of other malignant neoplasm of large intestine: Secondary | ICD-10-CM | POA: Diagnosis not present

## 2020-04-07 DIAGNOSIS — R69 Illness, unspecified: Secondary | ICD-10-CM | POA: Diagnosis not present

## 2020-04-07 MED ORDER — OXYMETAZOLINE HCL 0.05 % NA SOLN
3.0000 | Freq: Once | NASAL | Status: AC
  Administered 2020-04-07: 3 via NASAL
  Filled 2020-04-07: qty 30

## 2020-04-07 MED ORDER — COCAINE HCL 4 % EX SOLN
4.0000 mL | Freq: Once | CUTANEOUS | Status: AC
  Administered 2020-04-07: 4 mL via TOPICAL
  Filled 2020-04-07: qty 4

## 2020-04-07 NOTE — ED Notes (Signed)
ENT Cart at bedside. 

## 2020-04-07 NOTE — ED Provider Notes (Signed)
[facility] EMERGENCY DEPARTMENT Provider Note   CSN: [id number] Arrival date & time: 04/07/20  0848     History Chief Complaint  Patient presents with  . Epistaxis    Kairee EUSEVIO SCHRIVER is a 86 y.o. male.  Patient c/o nosebleed. Symptoms acute onset this AM, esp left sided, moderate, improved w pressure/pinching nose/afrin. Is on eliquis for afib. Denies other abnormal bruising or bleeding. Denies nasal trauma, sneezing or uri symptoms. No faintness or lightheadedness.   The history is provided by the patient and the EMS personnel.  Epistaxis Associated symptoms: no fever and no headaches        Past Medical History:  Diagnosis Date  . Abscess 7/13   abd abscess  . Cancer [facility])    colon cancer  . Chronic atrial fibrillation ([place])   . History of colon cancer 2000   T3N0 s/p colectomy  . History of echocardiogram    a. Echo 2/13:  Mild LVH, EF 55-60%, mild MR, moderate LAE, mild to moderate RAE;  b. Echo 1/17: mild LVH, vigorous LVF, EF 65-70%, no RWMA, trivial AI, trivial MR, severe LAE, mild RAE, mod TR, PASP 32 mmHg  . History of stress test    a. Cardiolite in 9/04: EF 67%, no ischemia.;  b. Myoview 6/16: no ischemia   . Hx of adenomatous colonic polyps   . Hyperlipidemia   . Hypertension   . METHICILLIN RESISTANT STAPHYLOCOCCUS AUREUS INFECTION 12/12/2006   Annotation: stitch abcess in lower abdomen after colon cancer resection Qualifier: Diagnosis of  By: [name] MD, [name]    . Myocardial infarction ([place]) 1985  . Postoperative stitch abscess 07/19/2011  . PVC's (premature ventricular contractions)   . Syncope and collapse    pt denies  . Thyroid disease    followed by [name]    Patient Active Problem List   Diagnosis Date Noted  . Benign essential HTN   . Sleep disturbance   . SOB (shortness of breath)   . Transaminitis   . Prediabetes   . Thalamic hemorrhage ([place]) 01/23/2018  . Cerebral hemorrhage ([place])   . Essential hypertension     . Dyslipidemia   . Dementia without behavioral disturbance ([place]) 01/22/2018  . Ataxia 01/21/2018  . Bleeding in brain ([place]) 01/21/2018  . Hypoxia 01/21/2018  . Chest pain 01/20/2018  . Hearing impairment 11/11/2015  . Olecranon bursitis of left elbow 11/11/2015  . LBBB (left bundle branch block) 05/27/2015  . Senile purpura ([place]) 02/22/2015  . Wound, open, arm, forearm 02/22/2015  . Advanced care planning/counseling discussion 11/06/2014  . Medicare annual wellness visit, initial 11/06/2014  . Bilateral knee pain 03/17/2014  . DOE (dyspnea on exertion) 02/16/2014  . Memory impairment 03/06/2013  . MRSA colonization 12/12/2012  . Nevus of sternal region 12/04/2012  . Seasonal allergic rhinitis 10/22/2012  . Thyroid disease   . Suprapubic abscess 08/21/2012  . H/O colon cancer, stage I 06/24/2012  . Adenomatous colon polyp 06/24/2012  . Open wound-left arm and suprapubic area. 03/11/2012  . Chest discomfort 04/21/2011  . Hyperlipidemia 10/14/2010  . Atrial fibrillation ([place]) 08/08/2010  . INSOMNIA, MILD 12/12/2006    Past Surgical History:  Procedure Laterality Date  . COLECTOMY  2000   6 inches removed  . COLONOSCOPY  2009   polyp, h/o cancer [name])  . EYE SURGERY     cataract surgery both eyes- pts ates he has never had cataract surgery 10-26-15  . HERNIA REPAIR  09/2002  DB umbilical hernia  . INCISE AND DRAIN ABCESS  2009,2010,2011,2012  . INCISION AND DRAINAGE Right 04/02/2013   Procedure: INCISION AND DRAINAGE RIGHT KNEE HEMATOMA;  Surgeon: [name], MD;  Location: [facility];  Service: Orthopedics;  Laterality: Right;  . INCISION AND DRAINAGE ABSCESS N/A 09/20/2012   Procedure: INCISION AND DRAINAGE SUPRAPUBIC ABSCESS;  Surgeon: [name], MD;  Location: [facility];  Service: General;  Laterality: N/A;  . INCISION AND DRAINAGE ABSCESS Left 02/28/2013   Procedure: INCISION AND DRAINAGE LEFT ARM ABSCESS;  Surgeon: [name], MD;   Location: [facility];  Service: General;  Laterality: Left;  . LUMBAR LAMINECTOMY  1977  . MASS EXCISION  03/07/2012   EXCISION MASS;  Surgeon: [name], MD;  Laterality: Right;  removal mass posterior right arm  . POLYPECTOMY    . WOUND EXPLORATION  2006   For possible stitch abscess Dr [name]       Family History  Problem Relation Age of Onset  . Stroke Mother   . Stroke Father   . Hypertension Father   . CAD Father 54       MI  . Dementia Father   . Stroke Sister 71  . Stroke Brother   . Stroke Brother   . Diabetes Brother   . CAD Brother 75       MI  . Dementia Sister 32  . Dementia Brother   . Colon cancer Cousin        x4 total with colon cancer  . Cancer Neg Hx   . Colon polyps Neg Hx   . Rectal cancer Neg Hx   . Stomach cancer Neg Hx     Social History   Tobacco Use  . Smoking status: Former Smoker    Quit date: 10/13/1977    Years since quitting: 42.5  . Smokeless tobacco: Former Systems developer    Types: [place] date: 10/13/1977  Vaping Use  . Vaping Use: Never used  Substance Use Topics  . Alcohol use: No    Alcohol/week: 0.0 standard drinks  . Drug use: No    Home Medications Prior to Admission medications   Medication Sig Start Date End Date Taking? Authorizing Provider  acetaminophen (TYLENOL) 325 MG tablet Take 2 tablets (650 mg total) by mouth every 4 (four) hours as needed for headache or mild pain. 01/31/18   Angiulli, Lavon Paganini, PA-C  ALPRAZolam Duanne Moron) 0.25 MG tablet Take 1 tablet (0.25 mg total) by mouth 2 (two) times daily as needed for anxiety. 01/31/18   Angiulli, Lavon Paganini, PA-C  Artificial Tear Ointment (DRY EYES OP) Apply 1 drop to eye as needed (dry eyes).    [provider]  atorvastatin (LIPITOR) 40 MG tablet Take 1 tablet (40 mg total) by mouth daily at 6 PM. 01/31/18   Angiulli, Lavon Paganini, PA-C  donepezil (ARICEPT) 10 MG tablet Take 10 mg by mouth at bedtime.    [provider]  ELIQUIS 5 MG TABS tablet TAKE 1 TABLET  BY MOUTH TWICE A DAY 12/17/19   Nahser, Wonda Cheng, MD  metoprolol tartrate (LOPRESSOR) 25 MG tablet TAKE 1 TABLET BY MOUTH TWICE A DAY 03/22/20   Nahser, Wonda Cheng, MD  rosuvastatin (CRESTOR) 5 MG tablet Take 1 tablet (5 mg total) by mouth daily. 11/20/19   Nahser, Wonda Cheng, MD  zolpidem (AMBIEN) 10 MG tablet Take 1 tablet (10 mg total) by mouth at bedtime as needed for sleep.  01/31/18   Angiulli, Lavon Paganini, PA-C    Allergies    Patient has no known allergies.  Review of Systems   Review of Systems  Constitutional: Negative for fever.  HENT: Positive for nosebleeds.   Respiratory: Negative for shortness of breath.   Gastrointestinal: Negative for blood in stool, nausea and vomiting.  Genitourinary: Negative for hematuria.  Neurological: Negative for light-headedness and headaches.  Hematological:       On eliquis. No other abnormal bruising or bleeding.     Physical Exam Updated Vital Signs BP 119/64 (BP Location: Right Arm)   Pulse 62   Temp 98 F (36.7 C) (Oral)   Resp 19   SpO2 98%   Physical Exam Vitals and nursing note reviewed.  Constitutional:      Appearance: Normal appearance. He is well-developed.  HENT:     Head: Atraumatic.     Nose:     Comments: Blood left nares. Right nares clear with no area visualized that requires intervention/cautery.    Mouth/Throat:     Mouth: Mucous membranes are moist.     Pharynx: Oropharynx is clear.     Comments: No active bleeding visualized.  Eyes:     General: No scleral icterus.    Conjunctiva/sclera: Conjunctivae normal.  Neck:     Trachea: No tracheal deviation.  Cardiovascular:     Rate and Rhythm: Normal rate.     Pulses: Normal pulses.  Pulmonary:     Effort: Pulmonary effort is normal. No accessory muscle usage or respiratory distress.  Abdominal:     General: There is no distension.     Tenderness: There is no abdominal tenderness.  Musculoskeletal:        General: No swelling.     Cervical back: Neck supple.    Skin:    General: Skin is warm and dry.     Findings: No rash.  Neurological:     Mental Status: He is alert.     Comments: Alert, speech clear.   Psychiatric:        Mood and Affect: Mood normal.     ED Results / Procedures / Treatments   Labs (all labs ordered are listed, but only abnormal results are displayed) Labs Reviewed - No data to display  EKG None  Radiology No results found.  Procedures Procedures (including critical care time)  Medications Ordered in ED Medications  oxymetazoline (AFRIN) 0.05 % nasal spray 3 spray (has no administration in time range)  cocaine 4 % topical solution 4 mL (has no administration in time range)    ED Course  I have reviewed the triage vital signs and the nursing notes.  Pertinent labs & imaging results that were available during my care of the patient were reviewed by me and considered in my medical decision making (see chart for details).    MDM Rules/Calculators/A&P                         Nasal cart.   Afrin and topical cocaine on cotton ball, inserted snugly into left nares.   Reviewed nursing notes and prior charts for additional history.   On recheck, small area anterior septum oozing. Cautery with silver nitrate.   Observed for several minutes.   Recheck pt, no bleeding anteriorly or posteriorly.  Pt is requesting discharge, and currently appears stable for d/c.      Final Clinical Impression(s) / ED Diagnoses Final diagnoses:  None  Rx / DC Orders ED Discharge Orders    None       Lajean Saver, MD 04/07/20 1102

## 2020-04-07 NOTE — ED Triage Notes (Addendum)
Pt BIB EMS from home due to nose bleed starting at 4am. Pt is on eliqus.Pt reports it stopped when EMS arrived and gave him afrin. Pt is A&Ox4,Per EMS pt lost about a coffee mug amount of blood. At the time of arrival pt is no longer having a nose bleed

## 2020-04-07 NOTE — Discharge Instructions (Signed)
It was our pleasure to provide your ER care today - we hope that you feel better.  Avoid sneezing, blowing nose, or rubbing nose/fingers into nose, for the next day.   To help keep nasal mucosa moist you may use saline nasal drops and use humidifier at home/in room you sleep.   If bleeding recurs, hold directly pressure/pinch nose for 10 minutes without letting up to see if still bleeding.  Return to ER if worse, new symptoms, heavy/persistent bleeding, weak/faint, other abnormal bleeding, or other concern.

## 2020-04-09 ENCOUNTER — Other Ambulatory Visit: Payer: Self-pay

## 2020-04-09 ENCOUNTER — Emergency Department (HOSPITAL_COMMUNITY)
Admission: EM | Admit: 2020-04-09 | Discharge: 2020-04-09 | Disposition: A | Discharge status: Home / Self Care | Payer: Medicare Other | Attending: Emergency Medicine | Admitting: Emergency Medicine

## 2020-04-09 ENCOUNTER — Telehealth: Payer: Self-pay | Admitting: Family Medicine

## 2020-04-09 DIAGNOSIS — R040 Epistaxis: Secondary | ICD-10-CM | POA: Diagnosis not present

## 2020-04-09 DIAGNOSIS — I4891 Unspecified atrial fibrillation: Secondary | ICD-10-CM | POA: Diagnosis not present

## 2020-04-09 DIAGNOSIS — I10 Essential (primary) hypertension: Secondary | ICD-10-CM | POA: Insufficient documentation

## 2020-04-09 DIAGNOSIS — Z79899 Other long term (current) drug therapy: Secondary | ICD-10-CM | POA: Insufficient documentation

## 2020-04-09 DIAGNOSIS — Z87891 Personal history of nicotine dependence: Secondary | ICD-10-CM | POA: Diagnosis not present

## 2020-04-09 DIAGNOSIS — Z7901 Long term (current) use of anticoagulants: Secondary | ICD-10-CM | POA: Insufficient documentation

## 2020-04-09 DIAGNOSIS — R58 Hemorrhage, not elsewhere classified: Secondary | ICD-10-CM | POA: Diagnosis not present

## 2020-04-09 MED ORDER — CEPHALEXIN 500 MG PO CAPS: 500.0000 mg | ORAL_CAPSULE | Freq: Two times a day (BID) | ORAL | 0 refills | Status: AC

## 2020-04-09 MED ORDER — OXYMETAZOLINE HCL 0.05 % NA SOLN
1.0000 | Freq: Once | NASAL | Status: AC
  Administered 2020-04-09: 1 via NASAL
  Filled 2020-04-09: qty 30

## 2020-04-09 MED ORDER — CEPHALEXIN 250 MG PO CAPS
500.0000 mg | ORAL_CAPSULE | Freq: Once | ORAL | Status: AC
  Administered 2020-04-09: 500 mg via ORAL
  Filled 2020-04-09: qty 2

## 2020-04-09 NOTE — ED Provider Notes (Signed)
  Face-to-face evaluation   History: He presents for recurrent nosebleeding left-sided, that started this morning at home. Recent bleeding, treated in the ED 2 days ago.  Physical exam: Alert elderly male who is calm and comfortable. He is lucid. Blood on torso, appearing to come from the left nares. There is a small clot in the inferior left nares. No active bleeding at the time I saw the patient, 9:35 AM.  Medical screening examination/treatment/procedure(s) were conducted as a shared visit with non-physician practitioner(s) and myself.  I personally evaluated the patient during the encounter    Daleen Bo, MD 04/11/20 [phone]

## 2020-04-09 NOTE — ED Provider Notes (Signed)
[facility] EMERGENCY DEPARTMENT Provider Note   CSN: [id number] Arrival date & time: 04/09/20  2505     History Chief Complaint  Patient presents with  . Epistaxis    Christian KHAI ARRONA is a 86 y.o. male with PMHx dementia, A fib on Eliquism HTN, HLD who presents to the ED via EMS for epistaxis to L naris that began approximately 2 hours ago. Pt denies any trauma to this area prior to the bleeding beginning. He was seen in the ED 2 days ago for same and treated with afrin and cocaine spray with relief of bleeding. He has also been seen twice in May and June for Epistaxis. Pt does not have an ENT to follow with. He tried blowing his nose frequently at home and pinching the bridge without success prompting him to call EMS. He has no other complaints at this time.   The history is provided by the patient and medical records.       Past Medical History:  Diagnosis Date  . Abscess 7/13   abd abscess  . Cancer [facility])    colon cancer  . Chronic atrial fibrillation ([place])   . History of colon cancer 2000   T3N0 s/p colectomy  . History of echocardiogram    a. Echo 2/13:  Mild LVH, EF 55-60%, mild MR, moderate LAE, mild to moderate RAE;  b. Echo 1/17: mild LVH, vigorous LVF, EF 65-70%, no RWMA, trivial AI, trivial MR, severe LAE, mild RAE, mod TR, PASP 32 mmHg  . History of stress test    a. Cardiolite in 9/04: EF 67%, no ischemia.;  b. Myoview 6/16: no ischemia   . Hx of adenomatous colonic polyps   . Hyperlipidemia   . Hypertension   . METHICILLIN RESISTANT STAPHYLOCOCCUS AUREUS INFECTION 12/12/2006   Annotation: stitch abcess in lower abdomen after colon cancer resection Qualifier: Diagnosis of  By: [name] MD, [name]    . Myocardial infarction ([place]) 1985  . Postoperative stitch abscess 07/19/2011  . PVC's (premature ventricular contractions)   . Syncope and collapse    pt denies  . Thyroid disease    followed by [name]    Patient Active Problem List    Diagnosis Date Noted  . Benign essential HTN   . Sleep disturbance   . SOB (shortness of breath)   . Transaminitis   . Prediabetes   . Thalamic hemorrhage ([place]) 01/23/2018  . Cerebral hemorrhage ([place])   . Essential hypertension   . Dyslipidemia   . Dementia without behavioral disturbance ([place]) 01/22/2018  . Ataxia 01/21/2018  . Bleeding in brain ([place]) 01/21/2018  . Hypoxia 01/21/2018  . Chest pain 01/20/2018  . Hearing impairment 11/11/2015  . Olecranon bursitis of left elbow 11/11/2015  . LBBB (left bundle branch block) 05/27/2015  . Senile purpura ([place]) 02/22/2015  . Wound, open, arm, forearm 02/22/2015  . Advanced care planning/counseling discussion 11/06/2014  . Medicare annual wellness visit, initial 11/06/2014  . Bilateral knee pain 03/17/2014  . DOE (dyspnea on exertion) 02/16/2014  . Memory impairment 03/06/2013  . MRSA colonization 12/12/2012  . Nevus of sternal region 12/04/2012  . Seasonal allergic rhinitis 10/22/2012  . Thyroid disease   . Suprapubic abscess 08/21/2012  . H/O colon cancer, stage I 06/24/2012  . Adenomatous colon polyp 06/24/2012  . Open wound-left arm and suprapubic area. 03/11/2012  . Chest discomfort 04/21/2011  . Hyperlipidemia 10/14/2010  . Atrial fibrillation ([place]) 08/08/2010  . INSOMNIA, MILD 12/12/2006  Past Surgical History:  Procedure Laterality Date  . COLECTOMY  2000   6 inches removed  . COLONOSCOPY  2009   polyp, h/o cancer [name])  . EYE SURGERY     cataract surgery both eyes- pts ates he has never had cataract surgery 10-26-15  . HERNIA REPAIR  11/486   DB umbilical hernia  . INCISE AND DRAIN ABCESS  2009,2010,2011,2012  . INCISION AND DRAINAGE Right 04/02/2013   Procedure: INCISION AND DRAINAGE RIGHT KNEE HEMATOMA;  Surgeon: [name], MD;  Location: [place] [facility];  Service: Orthopedics;  Laterality: Right;  . INCISION AND DRAINAGE ABSCESS N/A 09/20/2012   Procedure: INCISION AND DRAINAGE SUPRAPUBIC ABSCESS;   Surgeon: [name], MD;  Location: [place];  Service: General;  Laterality: N/A;  . INCISION AND DRAINAGE ABSCESS Left 02/28/2013   Procedure: INCISION AND DRAINAGE LEFT ARM ABSCESS;  Surgeon: [name], MD;  Location: [place];  Service: General;  Laterality: Left;  . LUMBAR LAMINECTOMY  1977  . MASS EXCISION  03/07/2012   EXCISION MASS;  Surgeon: [name], MD;  Laterality: Right;  removal mass posterior right arm  . POLYPECTOMY    . WOUND EXPLORATION  2006   For possible stitch abscess Dr [name]       Family History  Problem Relation Age of Onset  . Stroke Mother   . Stroke Father   . Hypertension Father   . CAD Father 70       MI  . Dementia Father   . Stroke Sister 48  . Stroke Brother   . Stroke Brother   . Diabetes Brother   . CAD Brother 11       MI  . Dementia Sister 70  . Dementia Brother   . Colon cancer Cousin        x4 total with colon cancer  . Cancer Neg Hx   . Colon polyps Neg Hx   . Rectal cancer Neg Hx   . Stomach cancer Neg Hx     Social History   Tobacco Use  . Smoking status: Former Smoker    Quit date: 10/13/1977    Years since quitting: 42.5  . Smokeless tobacco: Former Systems developer    Types: [place] date: 10/13/1977  Vaping Use  . Vaping Use: Never used  Substance Use Topics  . Alcohol use: No    Alcohol/week: 0.0 standard drinks  . Drug use: No    Home Medications Prior to Admission medications   Medication Sig Start Date End Date Taking? Authorizing Provider  acetaminophen (TYLENOL) 325 MG tablet Take 2 tablets (650 mg total) by mouth every 4 (four) hours as needed for headache or mild pain. Patient not taking: Reported on 04/07/2020 01/31/18   Angiulli, Lavon Paganini, PA-C  ALPRAZolam Duanne Moron) 0.25 MG tablet Take 1 tablet (0.25 mg total) by mouth 2 (two) times daily as needed for anxiety. Patient not taking: Reported on 04/07/2020 01/31/18   Angiulli, Lavon Paganini, PA-C  Artificial Tear Ointment (DRY EYES OP)  Apply 1 drop to eye as needed (dry eyes).    [provider]  atorvastatin (LIPITOR) 40 MG tablet Take 1 tablet (40 mg total) by mouth daily at 6 PM. 01/31/18   Angiulli, Lavon Paganini, PA-C  cephALEXin (KEFLEX) 500 MG capsule Take 1 capsule (500 mg total) by mouth 2 (two) times daily for 7 days. 04/09/20 04/16/20  Sullivan Jacuinde, PA-C  ELIQUIS 5 MG TABS tablet TAKE 1  TABLET BY MOUTH TWICE A DAY 12/17/19   Nahser, Wonda Cheng, MD  metoprolol tartrate (LOPRESSOR) 25 MG tablet TAKE 1 TABLET BY MOUTH TWICE A DAY 03/22/20   Nahser, Wonda Cheng, MD  rosuvastatin (CRESTOR) 5 MG tablet Take 1 tablet (5 mg total) by mouth daily. 11/20/19   Nahser, Wonda Cheng, MD  zolpidem (AMBIEN) 10 MG tablet Take 1 tablet (10 mg total) by mouth at bedtime as needed for sleep. 01/31/18   Angiulli, Lavon Paganini, PA-C    Allergies    Patient has no known allergies.  Review of Systems   Review of Systems  Constitutional: Negative for chills and fever.  HENT: Positive for nosebleeds. Negative for sore throat.   Respiratory: Negative for choking.   Hematological: Bruises/bleeds easily.  All other systems reviewed and are negative.   Physical Exam Updated Vital Signs BP 113/68 (BP Location: Right Arm)   Pulse 63   Temp 98.5 F (36.9 C) (Oral)   SpO2 98%   Physical Exam Vitals and nursing note reviewed.  Constitutional:      Appearance: He is not ill-appearing or diaphoretic.     Comments: Phonating normally  HENT:     Head: Normocephalic and atraumatic.     Comments: Dried blood to face; small anterior bleed appreciated to left naris. Right naris clear Eyes:     Conjunctiva/sclera: Conjunctivae normal.  Cardiovascular:     Rate and Rhythm: Rhythm irregularly irregular.  Pulmonary:     Effort: Pulmonary effort is normal.     Breath sounds: Normal breath sounds. No wheezing, rhonchi or rales.  Abdominal:     Palpations: Abdomen is soft.     Tenderness: There is no abdominal tenderness.  Musculoskeletal:      Cervical back: Neck supple.  Skin:    General: Skin is warm and dry.  Neurological:     Mental Status: He is alert.     ED Results / Procedures / Treatments   Labs (all labs ordered are listed, but only abnormal results are displayed) Labs Reviewed - No data to display  EKG None  Radiology No results found.  Procedures .Epistaxis Management  Date/Time: 04/09/2020 10:00 AM Performed by: Eustaquio Maize, PA-C Authorized by: Eustaquio Maize, PA-C   Consent:    Consent obtained:  Verbal   Consent given by:  Patient   Risks discussed:  Bleeding and infection Procedure details:    Treatment site:  L anterior   Treatment method:  Silver nitrate and anterior pack   Treatment complexity:  Limited   Treatment episode: initial   Post-procedure details:    Assessment:  Bleeding stopped   Patient tolerance of procedure:  Tolerated well, no immediate complications Comments:     Rhino rocket placed successfully with 6 CCs inflation. Pt tolerated procedure and was monitored for 15 minutes without signs of bleeding   (including critical care time)  Medications Ordered in ED Medications  cephALEXin (KEFLEX) capsule 500 mg (has no administration in time range)  oxymetazoline (AFRIN) 0.05 % nasal spray 1 spray (1 spray Each Nare Given 04/09/20 7564)    ED Course  I have reviewed the triage vital signs and the nursing notes.  Pertinent labs & imaging results that were available during my care of the patient were reviewed by me and considered in my medical decision making (see chart for details).    MDM Rules/Calculators/A&P  86 year old male who is on Eliquis for A. fib presenting to the ED via EMS for epistaxis that began 2 hours ago.  Unable to be controlled at home.  Seen in the ED 2 days ago for same and treated with Afrin and cocaine spray with relief of his bleed.  Arrival vitals are stable.  Patient appears to be in no acute distress.  He has some  dried blood to his face and neck.  He has a small active bleeder appreciated anteriorly in the left naris.  Right naris clear.  Afrin soaked gauze inserted into naris, will plan to reevaluate in 10 minutes.  If bleeding is still present may consider silver nitrate.  On reevaluation pt with small blood clot to anterior naris - removed with forceps with a small active bleeding that was cauterized however pt did not tolerate very well and continued to pull back during procedure. I evaluated patient again 10 minutes later and it does appear he still has some bleeding more posteriorly and therefore it was decided to place a rhino rocket. He tolerated the procedure well and was monitored for 15 minutes with success. Evaluated by attending physician Dr. Eulis Foster who agrees pt stable for discharge at this time. Pt instructed to follow up with ENT in 2-3 days for removal. He has been provided Rx for keflex to prevent an infection. Strict return precautions discussed.   This note was prepared using Dragon voice recognition software and may include unintentional dictation errors due to the inherent limitations of voice recognition software.   Final Clinical Impression(s) / ED Diagnoses Final diagnoses:  Epistaxis    Rx / DC Orders ED Discharge Orders         Ordered    cephALEXin (KEFLEX) 500 MG capsule  2 times daily        04/09/20 1049           Discharge Instructions     Please follow up with ENT in 2-3 days time to get packing removed. You will need to call Dr. Redmond Baseman' office to schedule an appointment Pick up antibiotics and take as prescribed to prevent an infection Return to the ED IMMEDIATELY if nosebleed returns despite packing       Eustaquio Maize, PA-C 04/09/20 1110    Daleen Bo, MD 04/11/20 [phone]

## 2020-04-09 NOTE — ED Triage Notes (Signed)
Pt was brought by EMS. Pt c/o due to a bloody nose. Pt is bleeding from left nostril. Pt states he is on elliquis but does not know why. Vital signs are stable.

## 2020-04-09 NOTE — Discharge Instructions (Addendum)
Please follow up with ENT in 2-3 days time to get packing removed. You will need to call Dr. Redmond Baseman' office to schedule an appointment Pick up antibiotics and take as prescribed to prevent an infection Return to the ED IMMEDIATELY if nosebleed returns despite packing

## 2020-04-15 ENCOUNTER — Telehealth: Payer: Self-pay | Admitting: Cardiovascular Disease

## 2020-04-15 NOTE — Telephone Encounter (Signed)
Spoke with the patient's daughter who states that the patient has been to the ER twice over the past week for nose bleeds that he has not been able to control on his own. The wife is concerned about the patient being on Eliquis. I advised the daughter that while Eliquis can make bleeding worse it is not the cause of the nose bleeds and the patient needs to follow up with ENT or PCP. He has an appointment with his PCP on 12/17.

## 2020-04-15 NOTE — Telephone Encounter (Signed)
Pt c/o medication issue:  1. Name of Medication: ELIQUIS 5 MG TABS tablet  2. How are you currently taking this medication (dosage and times per day)? 1 tablet twice a day  3. Are you having a reaction (difficulty breathing--STAT)? no  4. What is your medication issue? [name] states the patient has gone to the ED twice with severe nose bleeds over Thanksgiving. Please advise.

## 2020-04-30 DIAGNOSIS — C189 Malignant neoplasm of colon, unspecified: Secondary | ICD-10-CM | POA: Diagnosis not present

## 2020-04-30 DIAGNOSIS — E0500 Thyrotoxicosis with diffuse goiter without thyrotoxic crisis or storm: Secondary | ICD-10-CM | POA: Diagnosis not present

## 2020-04-30 DIAGNOSIS — F0280 Dementia in other diseases classified elsewhere without behavioral disturbance: Secondary | ICD-10-CM | POA: Diagnosis not present

## 2020-04-30 DIAGNOSIS — Z7901 Long term (current) use of anticoagulants: Secondary | ICD-10-CM | POA: Diagnosis not present

## 2020-04-30 DIAGNOSIS — C439 Malignant melanoma of skin, unspecified: Secondary | ICD-10-CM | POA: Diagnosis not present

## 2020-04-30 DIAGNOSIS — E785 Hyperlipidemia, unspecified: Secondary | ICD-10-CM | POA: Diagnosis not present

## 2020-04-30 DIAGNOSIS — I618 Other nontraumatic intracerebral hemorrhage: Secondary | ICD-10-CM | POA: Diagnosis not present

## 2020-04-30 DIAGNOSIS — I2510 Atherosclerotic heart disease of native coronary artery without angina pectoris: Secondary | ICD-10-CM | POA: Diagnosis not present

## 2020-04-30 DIAGNOSIS — I480 Paroxysmal atrial fibrillation: Secondary | ICD-10-CM | POA: Diagnosis not present

## 2020-04-30 DIAGNOSIS — G4700 Insomnia, unspecified: Secondary | ICD-10-CM | POA: Diagnosis not present

## 2020-07-07 ENCOUNTER — Emergency Department (HOSPITAL_BASED_OUTPATIENT_CLINIC_OR_DEPARTMENT_OTHER): Payer: Medicare Other

## 2020-07-07 ENCOUNTER — Encounter (HOSPITAL_BASED_OUTPATIENT_CLINIC_OR_DEPARTMENT_OTHER): Payer: Self-pay | Admitting: Obstetrics and Gynecology

## 2020-07-07 ENCOUNTER — Emergency Department (HOSPITAL_BASED_OUTPATIENT_CLINIC_OR_DEPARTMENT_OTHER)
Admission: EM | Admit: 2020-07-07 | Discharge: 2020-07-07 | Disposition: A | Discharge status: Home / Self Care | Payer: Medicare Other | Source: Home / Self Care | Attending: Emergency Medicine | Admitting: Emergency Medicine

## 2020-07-07 ENCOUNTER — Other Ambulatory Visit: Payer: Self-pay

## 2020-07-07 ENCOUNTER — Emergency Department (HOSPITAL_COMMUNITY): Payer: Medicare Other

## 2020-07-07 ENCOUNTER — Encounter (HOSPITAL_COMMUNITY): Payer: Self-pay | Admitting: Emergency Medicine

## 2020-07-07 ENCOUNTER — Emergency Department (HOSPITAL_COMMUNITY)
Admission: EM | Admit: 2020-07-07 | Discharge: 2020-07-07 | Disposition: A | Discharge status: Home / Self Care | Payer: Medicare Other | Attending: Emergency Medicine | Admitting: Emergency Medicine

## 2020-07-07 ENCOUNTER — Ambulatory Visit: Payer: Medicare Other | Admitting: Nurse Practitioner

## 2020-07-07 DIAGNOSIS — Z8679 Personal history of other diseases of the circulatory system: Secondary | ICD-10-CM | POA: Diagnosis not present

## 2020-07-07 DIAGNOSIS — F0390 Unspecified dementia without behavioral disturbance: Secondary | ICD-10-CM | POA: Insufficient documentation

## 2020-07-07 DIAGNOSIS — Z79899 Other long term (current) drug therapy: Secondary | ICD-10-CM | POA: Insufficient documentation

## 2020-07-07 DIAGNOSIS — Z87891 Personal history of nicotine dependence: Secondary | ICD-10-CM | POA: Insufficient documentation

## 2020-07-07 DIAGNOSIS — I10 Essential (primary) hypertension: Secondary | ICD-10-CM | POA: Insufficient documentation

## 2020-07-07 DIAGNOSIS — I482 Chronic atrial fibrillation, unspecified: Secondary | ICD-10-CM | POA: Insufficient documentation

## 2020-07-07 DIAGNOSIS — R0682 Tachypnea, not elsewhere classified: Secondary | ICD-10-CM | POA: Insufficient documentation

## 2020-07-07 DIAGNOSIS — I252 Old myocardial infarction: Secondary | ICD-10-CM | POA: Insufficient documentation

## 2020-07-07 DIAGNOSIS — G319 Degenerative disease of nervous system, unspecified: Secondary | ICD-10-CM | POA: Diagnosis not present

## 2020-07-07 DIAGNOSIS — Z7901 Long term (current) use of anticoagulants: Secondary | ICD-10-CM | POA: Insufficient documentation

## 2020-07-07 DIAGNOSIS — S29012A Strain of muscle and tendon of back wall of thorax, initial encounter: Secondary | ICD-10-CM | POA: Diagnosis not present

## 2020-07-07 DIAGNOSIS — R0781 Pleurodynia: Secondary | ICD-10-CM

## 2020-07-07 DIAGNOSIS — S29019A Strain of muscle and tendon of unspecified wall of thorax, initial encounter: Secondary | ICD-10-CM

## 2020-07-07 DIAGNOSIS — S0990XA Unspecified injury of head, initial encounter: Secondary | ICD-10-CM | POA: Diagnosis not present

## 2020-07-07 DIAGNOSIS — R079 Chest pain, unspecified: Secondary | ICD-10-CM | POA: Diagnosis not present

## 2020-07-07 DIAGNOSIS — Z7982 Long term (current) use of aspirin: Secondary | ICD-10-CM | POA: Insufficient documentation

## 2020-07-07 DIAGNOSIS — I517 Cardiomegaly: Secondary | ICD-10-CM | POA: Diagnosis not present

## 2020-07-07 DIAGNOSIS — S29011A Strain of muscle and tendon of front wall of thorax, initial encounter: Secondary | ICD-10-CM | POA: Diagnosis not present

## 2020-07-07 DIAGNOSIS — S29002A Unspecified injury of muscle and tendon of back wall of thorax, initial encounter: Secondary | ICD-10-CM | POA: Diagnosis present

## 2020-07-07 DIAGNOSIS — Z85038 Personal history of other malignant neoplasm of large intestine: Secondary | ICD-10-CM | POA: Insufficient documentation

## 2020-07-07 DIAGNOSIS — R0602 Shortness of breath: Secondary | ICD-10-CM | POA: Diagnosis not present

## 2020-07-07 DIAGNOSIS — K449 Diaphragmatic hernia without obstruction or gangrene: Secondary | ICD-10-CM | POA: Diagnosis not present

## 2020-07-07 DIAGNOSIS — J9811 Atelectasis: Secondary | ICD-10-CM | POA: Diagnosis not present

## 2020-07-07 DIAGNOSIS — X58XXXA Exposure to other specified factors, initial encounter: Secondary | ICD-10-CM | POA: Diagnosis not present

## 2020-07-07 LAB — CBC
HCT: 48.2 % (ref 39.0–52.0)
HCT: 47.2 % (ref 39.0–52.0)
Hemoglobin: 14.4 g/dL (ref 13.0–17.0)
Hemoglobin: 14.6 g/dL (ref 13.0–17.0)
MCH: 25 pg — ABNORMAL LOW (ref 26.0–34.0)
MCH: 25.3 pg — ABNORMAL LOW (ref 26.0–34.0)
MCHC: 29.9 g/dL — ABNORMAL LOW (ref 30.0–36.0)
MCHC: 30.9 g/dL (ref 30.0–36.0)
MCV: 83.7 fL (ref 80.0–100.0)
MCV: 81.8 fL (ref 80.0–100.0)
Platelets: 218 10*3/uL (ref 150–400)
Platelets: 222 10*3/uL (ref 150–400)
RBC: 5.76 MIL/uL (ref 4.22–5.81)
RBC: 5.77 MIL/uL (ref 4.22–5.81)
RDW: 15.3 % (ref 11.5–15.5)
RDW: 15.5 % (ref 11.5–15.5)
WBC: 8.7 10*3/uL (ref 4.0–10.5)
WBC: 10 10*3/uL (ref 4.0–10.5)
nRBC: 0 % (ref 0.0–0.2)
nRBC: 0 % (ref 0.0–0.2)

## 2020-07-07 LAB — BASIC METABOLIC PANEL
Anion gap: 10 (ref 5–15)
Anion gap: 10 (ref 5–15)
BUN: 16 mg/dL (ref 8–23)
BUN: 19 mg/dL (ref 8–23)
CO2: 24 mmol/L (ref 22–32)
CO2: 24 mmol/L (ref 22–32)
Calcium: 10.1 mg/dL (ref 8.9–10.3)
Calcium: 10 mg/dL (ref 8.9–10.3)
Chloride: 104 mmol/L (ref 98–111)
Chloride: 104 mmol/L (ref 98–111)
Creatinine, Ser: 0.97 mg/dL (ref 0.61–1.24)
Creatinine, Ser: 0.92 mg/dL (ref 0.61–1.24)
GFR, Estimated: >60 mL/min (ref >60)
GFR, Estimated: >60 mL/min (ref >60)
Glucose, Bld: 103 mg/dL — ABNORMAL HIGH (ref 70–99)
Glucose, Bld: 108 mg/dL — ABNORMAL HIGH (ref 70–99)
Potassium: 3.9 mmol/L (ref 3.5–5.1)
Potassium: 3.9 mmol/L (ref 3.5–5.1)
Sodium: 138 mmol/L (ref 135–145)
Sodium: 138 mmol/L (ref 135–145)

## 2020-07-07 LAB — TROPONIN I (HIGH SENSITIVITY)
Troponin I (High Sensitivity): 10 ng/L (ref <18)
Troponin I (High Sensitivity): 10 ng/L (ref <18)
Troponin I (High Sensitivity): 8 ng/L (ref <18)

## 2020-07-07 LAB — PROTIME-INR
INR: 1.3 — ABNORMAL HIGH (ref 0.8–1.2)
Prothrombin Time: 15.6 seconds — ABNORMAL HIGH (ref 11.4–15.2)

## 2020-07-07 MED ORDER — HYDROCODONE-ACETAMINOPHEN 5-325 MG PO TABS
1.0000 | ORAL_TABLET | Freq: Once | ORAL | Status: AC
Start: 2020-07-07 — End: 2020-07-07
  Administered 2020-07-07: 1 via ORAL
  Filled 2020-07-07: qty 1

## 2020-07-07 MED ORDER — IOHEXOL 350 MG/ML SOLN
100.0000 mL | Freq: Once | INTRAVENOUS | Status: AC | PRN
  Administered 2020-07-07: 100 mL via INTRAVENOUS

## 2020-07-07 MED ORDER — OXYCODONE-ACETAMINOPHEN 5-325 MG PO TABS
1.0000 | ORAL_TABLET | Freq: Once | ORAL | Status: AC
Start: 2020-07-07 — End: 2020-07-07
  Administered 2020-07-07: 1 via ORAL
  Filled 2020-07-07: qty 1

## 2020-07-07 NOTE — ED Notes (Signed)
Patient verbalizes understanding of discharge instructions. Opportunity for questioning and answers were provided. Armband removed by staff, pt discharged from ED via wheelchair.  

## 2020-07-07 NOTE — ED Provider Notes (Signed)
[facility] EMERGENCY DEPARTMENT Provider Note   CSN: [id number] Arrival date & time: 07/07/20  1114     History Chief Complaint  Patient presents with  . Chest Pain    Christian Maxwell is a 87 y.o. male.  Patient is an 87 year old male with a history of atrial fibrillation on Eliquis, syncope, thyroid disease, hypertension, MI and prior CVA with dementia but still fairly highly functioning at home who is presenting today complaining of chest pain.  Patient's caregiver is with him now and provides additional history.  Patient was seen in the [facility] emergency room early this morning around 2 AM and was evaluated for this pain.  However he reports it just started hurting 1 week ago.  Caregiver reports that he had an unwitnessed fall in the hallway on Sunday and the pain seemed to start after that.  Patient reports the pain is severe when he takes a deep breath or tries to sit up and move.  He points to it being in the center of his chest over his sternum.  Caregiver noticed this morning he was not his normal chipper self.  He was having a hard time getting himself dressed and she had to put his shoes on which she reports she has never had to do in the past.  He also appeared short of breath to her but patient is currently denying any shortness of breath.  He denies any cough, abdominal pain.  He reports his diet has been the same and he has not noticed any leg swelling.  He did receive a Percocet while he was in the emergency room and reports that that did help with the pain but now the pain is returning because the medication is wearing off.  He has not taken any medication at home for this pain.  The history is provided by the patient, a caregiver and medical records.  Chest Pain      Past Medical History:  Diagnosis Date  . Abscess 7/13   abd abscess  . Cancer [facility])    colon cancer  . Chronic atrial fibrillation ([place])   . History of colon cancer 2000   T3N0 s/p colectomy   . History of echocardiogram    a. Echo 2/13:  Mild LVH, EF 55-60%, mild MR, moderate LAE, mild to moderate RAE;  b. Echo 1/17: mild LVH, vigorous LVF, EF 65-70%, no RWMA, trivial AI, trivial MR, severe LAE, mild RAE, mod TR, PASP 32 mmHg  . History of stress test    a. Cardiolite in 9/04: EF 67%, no ischemia.;  b. Myoview 6/16: no ischemia   . Hx of adenomatous colonic polyps   . Hyperlipidemia   . Hypertension   . METHICILLIN RESISTANT STAPHYLOCOCCUS AUREUS INFECTION 12/12/2006   Annotation: stitch abcess in lower abdomen after colon cancer resection Qualifier: Diagnosis of  By: [name] MD, [name]    . Myocardial infarction ([place]) 1985  . Postoperative stitch abscess 07/19/2011  . PVC's (premature ventricular contractions)   . Syncope and collapse    pt denies  . Thyroid disease    followed by [name]    Patient Active Problem List   Diagnosis Date Noted  . Benign essential HTN   . Sleep disturbance   . SOB (shortness of breath)   . Transaminitis   . Prediabetes   . Thalamic hemorrhage ([place]) 01/23/2018  . Cerebral hemorrhage ([place])   . Essential hypertension   . Dyslipidemia   . Dementia without  behavioral disturbance ([place]) 01/22/2018  . Ataxia 01/21/2018  . Bleeding in brain ([place]) 01/21/2018  . Hypoxia 01/21/2018  . Chest pain 01/20/2018  . Hearing impairment 11/11/2015  . Olecranon bursitis of left elbow 11/11/2015  . LBBB (left bundle branch block) 05/27/2015  . Senile purpura ([place]) 02/22/2015  . Wound, open, arm, forearm 02/22/2015  . Advanced care planning/counseling discussion 11/06/2014  . Medicare annual wellness visit, initial 11/06/2014  . Bilateral knee pain 03/17/2014  . DOE (dyspnea on exertion) 02/16/2014  . Memory impairment 03/06/2013  . MRSA colonization 12/12/2012  . Nevus of sternal region 12/04/2012  . Seasonal allergic rhinitis 10/22/2012  . Thyroid disease   . Suprapubic abscess 08/21/2012  . H/O colon cancer, stage I 06/24/2012  . Adenomatous  colon polyp 06/24/2012  . Open wound-left arm and suprapubic area. 03/11/2012  . Chest discomfort 04/21/2011  . Hyperlipidemia 10/14/2010  . Atrial fibrillation ([place]) 08/08/2010  . INSOMNIA, MILD 12/12/2006    Past Surgical History:  Procedure Laterality Date  . COLECTOMY  2000   6 inches removed  . COLONOSCOPY  2009   polyp, h/o cancer [name])  . EYE SURGERY     cataract surgery both eyes- pts ates he has never had cataract surgery 10-26-15  . HERNIA REPAIR  05/6107   DB umbilical hernia  . INCISE AND DRAIN ABCESS  2009,2010,2011,2012  . INCISION AND DRAINAGE Right 04/02/2013   Procedure: INCISION AND DRAINAGE RIGHT KNEE HEMATOMA;  Surgeon: [name], MD;  Location: [place];  Service: Orthopedics;  Laterality: Right;  . INCISION AND DRAINAGE ABSCESS N/A 09/20/2012   Procedure: INCISION AND DRAINAGE SUPRAPUBIC ABSCESS;  Surgeon: [name], MD;  Location: [place];  Service: General;  Laterality: N/A;  . INCISION AND DRAINAGE ABSCESS Left 02/28/2013   Procedure: INCISION AND DRAINAGE LEFT ARM ABSCESS;  Surgeon: [name], MD;  Location: [place];  Service: General;  Laterality: Left;  . LUMBAR LAMINECTOMY  1977  . MASS EXCISION  03/07/2012   EXCISION MASS;  Surgeon: [name], MD;  Laterality: Right;  removal mass posterior right arm  . POLYPECTOMY    . WOUND EXPLORATION  2006   For possible stitch abscess Dr [name]       Family History  Problem Relation Age of Onset  . Stroke Mother   . Stroke Father   . Hypertension Father   . CAD Father 72       MI  . Dementia Father   . Stroke Sister 32  . Stroke Brother   . Stroke Brother   . Diabetes Brother   . CAD Brother 80       MI  . Dementia Sister 40  . Dementia Brother   . Colon cancer Cousin        x4 total with colon cancer  . Cancer Neg Hx   . Colon polyps Neg Hx   . Rectal cancer Neg Hx   . Stomach cancer Neg Hx     Social History   Tobacco Use  . Smoking  status: Former Smoker    Quit date: 10/13/1977    Years since quitting: 42.7  . Smokeless tobacco: Former Systems developer    Types: [place] date: 10/13/1977  Vaping Use  . Vaping Use: Never used  Substance Use Topics  . Alcohol use: No    Alcohol/week: 0.0 standard drinks  . Drug use: No    Home Medications Prior to Admission medications  Medication Sig Start Date End Date Taking? Authorizing Provider  acetaminophen (TYLENOL) 325 MG tablet Take 2 tablets (650 mg total) by mouth every 4 (four) hours as needed for headache or mild pain. Patient not taking: No sig reported 01/31/18   Angiulli, Lavon Paganini, PA-C  ALPRAZolam Duanne Moron) 0.25 MG tablet Take 1 tablet (0.25 mg total) by mouth 2 (two) times daily as needed for anxiety. Patient not taking: Reported on 04/07/2020 01/31/18   Angiulli, Lavon Paganini, PA-C  aspirin EC 81 MG tablet Take 81 mg by mouth in the morning and at bedtime. Swallow whole.    [provider]  atorvastatin (LIPITOR) 40 MG tablet Take 1 tablet (40 mg total) by mouth daily at 6 PM. 01/31/18   Angiulli, Lavon Paganini, PA-C  ELIQUIS 5 MG TABS tablet TAKE 1 TABLET BY MOUTH TWICE A DAY Patient taking differently: Take 5 mg by mouth 2 (two) times daily. 12/17/19   Nahser, Wonda Cheng, MD  hydroxypropyl methylcellulose / hypromellose (ISOPTO TEARS / GONIOVISC) 2.5 % ophthalmic solution Place 1 drop into both eyes in the morning and at bedtime.    [provider]  metoprolol tartrate (LOPRESSOR) 25 MG tablet TAKE 1 TABLET BY MOUTH TWICE A DAY Patient taking differently: Take 25 mg by mouth 2 (two) times daily. 03/22/20   Nahser, Wonda Cheng, MD  rosuvastatin (CRESTOR) 5 MG tablet Take 1 tablet (5 mg total) by mouth daily. 11/20/19   Nahser, Wonda Cheng, MD  zolpidem (AMBIEN) 10 MG tablet Take 1 tablet (10 mg total) by mouth at bedtime as needed for sleep. Patient taking differently: Take 10 mg by mouth at bedtime. 01/31/18   Angiulli, Lavon Paganini, PA-C    Allergies    Patient has no known  allergies.  Review of Systems   Review of Systems  Cardiovascular: Positive for chest pain.  All other systems reviewed and are negative.   Physical Exam Updated Vital Signs BP (!) 144/77   Pulse 87   Temp 97.8 F (36.6 C) (Oral)   Resp (!) 30   Ht 5\' 10"  (1.778 m)   Wt 82.6 kg   SpO2 95%   BMI 26.11 kg/m   Physical Exam Vitals and nursing note reviewed.  Constitutional:      General: He is not in acute distress.    Appearance: He is well-developed, normal weight and well-nourished.  HENT:     Head: Normocephalic and atraumatic.     Mouth/Throat:     Mouth: Oropharynx is clear and moist.  Eyes:     Extraocular Movements: EOM normal.     Conjunctiva/sclera: Conjunctivae normal.     Pupils: Pupils are equal, round, and reactive to light.  Cardiovascular:     Rate and Rhythm: Normal rate and regular rhythm.     Pulses: Intact distal pulses.     Heart sounds: No murmur heard.   Pulmonary:     Effort: Pulmonary effort is normal. Tachypnea present. No respiratory distress.     Breath sounds: Normal breath sounds. No wheezing or rales.     Comments: No tenderness with palpation over the sternum or lateral ribs bilaterally Chest:     Chest wall: No tenderness.  Abdominal:     General: There is no distension.     Palpations: Abdomen is soft.     Tenderness: There is no abdominal tenderness. There is no guarding or rebound.  Musculoskeletal:        General: No tenderness. Normal range of motion.  Cervical back: Normal range of motion and neck supple.     Right lower leg: No edema.     Left lower leg: No edema.  Skin:    General: Skin is warm and dry.     Capillary Refill: Capillary refill takes less than 2 seconds.     Findings: No erythema or rash.  Neurological:     Mental Status: He is alert and oriented to person, place, and time. Mental status is at baseline.     Comments: Able to ambulate without pain.  Psychiatric:        Mood and Affect: Mood and affect  normal.        Behavior: Behavior normal.     Comments: Calm and cooperative     ED Results / Procedures / Treatments   Labs (all labs ordered are listed, but only abnormal results are displayed) Labs Reviewed  BASIC METABOLIC PANEL - Abnormal; Notable for the following components:      Result Value   Glucose, Bld 108 (*)    All other components within normal limits  CBC - Abnormal; Notable for the following components:   MCH 25.3 (*)    All other components within normal limits  TROPONIN I (HIGH SENSITIVITY)    EKG EKG Interpretation  Date/Time:  Wednesday July 07 2020 11:29:27 EST Ventricular Rate:  88 PR Interval:    QRS Duration: 150 QT Interval:  399 QTC Calculation: 483 R Axis:   -64 Text Interpretation: Atrial fibrillation Left bundle branch block Baseline wander in lead(s) V3 No significant change since last tracing Confirmed by Blanchie Dessert [phone]) on 07/07/2020 11:56:25 AM   Radiology DG Chest 2 View  Result Date: 07/07/2020 CLINICAL DATA:  Chest pain EXAM: CHEST - 2 VIEW COMPARISON:  01/27/2018 FINDINGS: Mild cardiomegaly. Colonic interposition beneath right hemidiaphragm, which is elevated. Bibasilar atelectasis. No focal consolidation. IMPRESSION: Bibasilar atelectasis without focal consolidation. Electronically Signed   By: Ulyses Jarred M.D.   On: 07/07/2020 02:58   CT Head Wo Contrast  Result Date: 07/07/2020 CLINICAL DATA:  Head trauma. EXAM: CT HEAD WITHOUT CONTRAST TECHNIQUE: Contiguous axial images were obtained from the base of the skull through the vertex without intravenous contrast. COMPARISON:  CT head June 29, 21. FINDINGS: Brain: No evidence of acute large vascular territory infarction, hemorrhage, hydrocephalus, extra-axial collection or mass lesion/mass effect. Temporal lobe predominant atrophy. Scattered white matter hypodensities, likely related to chronic microvascular ischemic disease. Vascular: No hyperdense vessel identified. Calcific  atherosclerosis. Skull: No acute fracture. Sinuses/Orbits: Mild paranasal sinus mucosal thickening without air-fluid level. Unremarkable orbits. Remote nasal bone fracture/deformity. Other: No mastoid effusions. IMPRESSION: 1. No evidence of acute intracranial abnormality. 2. Chronic temporal lobe predominant atrophy Electronically Signed   By: Margaretha Sheffield MD   On: 07/07/2020 13:17   CT Angio Chest PE W and/or Wo Contrast  Result Date: 07/07/2020 CLINICAL DATA:  Chest pain and shortness of breath concern for pulmonary embolus. EXAM: CT ANGIOGRAPHY CHEST WITH CONTRAST TECHNIQUE: Multidetector CT imaging of the chest was performed using the standard protocol during bolus administration of intravenous contrast. Multiplanar CT image reconstructions and MIPs were obtained to evaluate the vascular anatomy. CONTRAST:  127mL OMNIPAQUE IOHEXOL 350 MG/ML SOLN COMPARISON:  Same day chest radiograph. CT a chest January 20, 2018 FINDINGS: Cardiovascular: Satisfactory opacification of the pulmonary arteries to the segmental level. No evidence of pulmonary embolism. Normal heart size. No pericardial effusion. Aortic atherosclerosis. No thoracic aortic aneurysm. Mediastinum/Nodes: Thyroid glands unremarkable. No mediastinal, hilar  or axillary lymph adenopathy. Tiny hiatal hernia. Trachea is unremarkable. Lungs/Pleura: Bibasilar atelectasis. Mild diffuse bronchial wall thickening. No suspicious pulmonary nodules or masses. No pleural effusion. No pneumothorax. Upper Abdomen: Layering stones in a nondistended gallbladder. Small splenule. Musculoskeletal: Healed remote right posterior rib fractures. Similar appearance of the T1 and T4 compression deformities. No acute osseous abnormality. Review of the MIP images confirms the above findings. IMPRESSION: 1. No evidence of pulmonary embolism. 2. Bibasilar atelectasis. Mild diffuse bronchial wall thickening, suggestive of small airways disease. 3. Cholelithiasis. 4. Similar  appearance of the T1 and T4 compression deformities. 5. Coronary artery calcifications.  Aortic atherosclerosis. Aortic Atherosclerosis (ICD10-I70.0). Electronically Signed   By: Dahlia Bailiff MD   On: 07/07/2020 13:19    Procedures Procedures   Medications Ordered in ED Medications  iohexol (OMNIPAQUE) 350 MG/ML injection 100 mL (has no administration in time range)  HYDROcodone-acetaminophen (NORCO/VICODIN) 5-325 MG per tablet 1 tablet (1 tablet Oral Given 07/07/20 1213)    ED Course  I have reviewed the triage vital signs and the nursing notes.  Pertinent labs & imaging results that were available during my care of the patient were reviewed by me and considered in my medical decision making (see chart for details).    MDM Rules/Calculators/A&P                          Elderly male presenting today with symptoms more consistent with a chest wall pain or pleuritic type pain.  This is in the setting of a unwitnessed fall on Sunday.  Also concerned he may have hit his head.  Patient did not report this to the emergency room last night but caregiver is now present giving this history.  She reports that he never complains of pain and this is very unusual for him.  She also feels that he has been more short of breath.  Patient is taking Eliquis as prescribed for his atrial fibrillation so PE would be less likely however with his increasing shortness of breath and pleuritic pain we will do a CTA to rule out PE, pneumonia or underlying fractures that did not appear on chest x-ray.  He did have a chest x-ray early this morning which was a normal limits including delta troponins, CBC and BMP all without acute findings.  Patient did receive a Percocet while he was in the emergency room and reports it did improve his pain but now the pain is coming back and he is requesting more medication.  Patient given oral pain control at this time until further evaluation is done.  2:10 PM Pt's EKG today is  unchanged.  CTA of the chest neg for acute issues.  He does have unchanged thoracic compression fx. there are no other acute findings to suggest pneumonia.  He does have gallstones but he has no abdominal pain at this time is had no nausea or vomiting and he has been eating normally.  At this time pleuritic pain cause is unknown.  Could be pleurisy versus musculoskeletal pain.  These findings were discussed with his caregiver.  Because he is at home alone majority of the day we decided that narcotics would not be a good option for him.  He will take Tylenol and try topical therapies.  He needs to follow-up with his PCP by Monday if he still having issues but was given strict return precautions.  MDM Number of Diagnoses or Management Options   Amount and/or Complexity of  Data Reviewed Clinical lab tests: ordered and reviewed Tests in the radiology section of CPT: ordered and reviewed Tests in the medicine section of CPT: ordered and reviewed Decide to obtain previous medical records or to obtain history from someone other than the patient: yes Obtain history from someone other than the patient: yes Review and summarize past medical records: yes Discuss the patient with other providers: no Independent visualization of images, tracings, or specimens: yes  Patient Progress Patient progress: stable   Final Clinical Impression(s) / ED Diagnoses Final diagnoses:  Pleuritic chest pain    Rx / DC Orders ED Discharge Orders    None       Blanchie Dessert, MD 07/07/20 1415

## 2020-07-07 NOTE — ED Provider Notes (Signed)
[facility] EMERGENCY DEPARTMENT Provider Note   CSN: [id number] Arrival date & time: 07/07/20  0159     History Chief Complaint  Patient presents with  . Chest Pain    [name] is a 87 y.o. male.  Patient presents to the emergency department for evaluation of chest and back pain.  Patient reports that he has been experiencing symptoms for 2 weeks.  Patient is not currently having any chest discomfort.  He does report that the pain in his upper back has been persistent.  He feels better if he lies at rest but when he tries to move, especially trying to get up out of bed, the pain significantly worsens.  No shortness of breath.        Past Medical History:  Diagnosis Date  . Abscess 7/13   abd abscess  . Cancer [facility])    colon cancer  . Chronic atrial fibrillation ([place])   . History of colon cancer 2000   T3N0 s/p colectomy  . History of echocardiogram    a. Echo 2/13:  Mild LVH, EF 55-60%, mild MR, moderate LAE, mild to moderate RAE;  b. Echo 1/17: mild LVH, vigorous LVF, EF 65-70%, no RWMA, trivial AI, trivial MR, severe LAE, mild RAE, mod TR, PASP 32 mmHg  . History of stress test    a. Cardiolite in 9/04: EF 67%, no ischemia.;  b. Myoview 6/16: no ischemia   . Hx of adenomatous colonic polyps   . Hyperlipidemia   . Hypertension   . METHICILLIN RESISTANT STAPHYLOCOCCUS AUREUS INFECTION 12/12/2006   Annotation: stitch abcess in lower abdomen after colon cancer resection Qualifier: Diagnosis of  By: [name] MD, [name]    . Myocardial infarction ([place]) 1985  . Postoperative stitch abscess 07/19/2011  . PVC's (premature ventricular contractions)   . Syncope and collapse    pt denies  . Thyroid disease    followed by [name]    Patient Active Problem List   Diagnosis Date Noted  . Benign essential HTN   . Sleep disturbance   . SOB (shortness of breath)   . Transaminitis   . Prediabetes   . Thalamic hemorrhage ([place]) 01/23/2018  . Cerebral  hemorrhage ([place])   . Essential hypertension   . Dyslipidemia   . Dementia without behavioral disturbance ([place]) 01/22/2018  . Ataxia 01/21/2018  . Bleeding in brain ([place]) 01/21/2018  . Hypoxia 01/21/2018  . Chest pain 01/20/2018  . Hearing impairment 11/11/2015  . Olecranon bursitis of left elbow 11/11/2015  . LBBB (left bundle branch block) 05/27/2015  . Senile purpura ([place]) 02/22/2015  . Wound, open, arm, forearm 02/22/2015  . Advanced care planning/counseling discussion 11/06/2014  . Medicare annual wellness visit, initial 11/06/2014  . Bilateral knee pain 03/17/2014  . DOE (dyspnea on exertion) 02/16/2014  . Memory impairment 03/06/2013  . MRSA colonization 12/12/2012  . Nevus of sternal region 12/04/2012  . Seasonal allergic rhinitis 10/22/2012  . Thyroid disease   . Suprapubic abscess 08/21/2012  . H/O colon cancer, stage I 06/24/2012  . Adenomatous colon polyp 06/24/2012  . Open wound-left arm and suprapubic area. 03/11/2012  . Chest discomfort 04/21/2011  . Hyperlipidemia 10/14/2010  . Atrial fibrillation ([place]) 08/08/2010  . INSOMNIA, MILD 12/12/2006    Past Surgical History:  Procedure Laterality Date  . COLECTOMY  2000   6 inches removed  . COLONOSCOPY  2009   polyp, h/o cancer [name])  . EYE SURGERY     cataract  surgery both eyes- pts ates he has never had cataract surgery 10-26-15  . HERNIA REPAIR  01/5092   DB umbilical hernia  . INCISE AND DRAIN ABCESS  2009,2010,2011,2012  . INCISION AND DRAINAGE Right 04/02/2013   Procedure: INCISION AND DRAINAGE RIGHT KNEE HEMATOMA;  Surgeon: [name], MD;  Location: [place];  Service: Orthopedics;  Laterality: Right;  . INCISION AND DRAINAGE ABSCESS N/A 09/20/2012   Procedure: INCISION AND DRAINAGE SUPRAPUBIC ABSCESS;  Surgeon: [name], MD;  Location: [place];  Service: General;  Laterality: N/A;  . INCISION AND DRAINAGE ABSCESS Left 02/28/2013   Procedure: INCISION AND DRAINAGE LEFT  ARM ABSCESS;  Surgeon: [name], MD;  Location: [place];  Service: General;  Laterality: Left;  . LUMBAR LAMINECTOMY  1977  . MASS EXCISION  03/07/2012   EXCISION MASS;  Surgeon: [name], MD;  Laterality: Right;  removal mass posterior right arm  . POLYPECTOMY    . WOUND EXPLORATION  2006   For possible stitch abscess Dr [name]       Family History  Problem Relation Age of Onset  . Stroke Mother   . Stroke Father   . Hypertension Father   . CAD Father 40       MI  . Dementia Father   . Stroke Sister 56  . Stroke Brother   . Stroke Brother   . Diabetes Brother   . CAD Brother 46       MI  . Dementia Sister 78  . Dementia Brother   . Colon cancer Cousin        x4 total with colon cancer  . Cancer Neg Hx   . Colon polyps Neg Hx   . Rectal cancer Neg Hx   . Stomach cancer Neg Hx     Social History   Tobacco Use  . Smoking status: Former Smoker    Quit date: 10/13/1977    Years since quitting: 42.7  . Smokeless tobacco: Former Systems developer    Types: [place] date: 10/13/1977  Vaping Use  . Vaping Use: Never used  Substance Use Topics  . Alcohol use: No    Alcohol/week: 0.0 standard drinks  . Drug use: No    Home Medications Prior to Admission medications   Medication Sig Start Date End Date Taking? Authorizing Provider  aspirin EC 81 MG tablet Take 81 mg by mouth in the morning and at bedtime. Swallow whole.   Yes [provider]  atorvastatin (LIPITOR) 40 MG tablet Take 1 tablet (40 mg total) by mouth daily at 6 PM. 01/31/18  Yes Angiulli, Lavon Paganini, PA-C  ELIQUIS 5 MG TABS tablet TAKE 1 TABLET BY MOUTH TWICE A DAY Patient taking differently: Take 5 mg by mouth 2 (two) times daily. 12/17/19  Yes Nahser, Wonda Cheng, MD  hydroxypropyl methylcellulose / hypromellose (ISOPTO TEARS / GONIOVISC) 2.5 % ophthalmic solution Place 1 drop into both eyes in the morning and at bedtime.   Yes [provider]  metoprolol tartrate (LOPRESSOR) 25 MG tablet  TAKE 1 TABLET BY MOUTH TWICE A DAY Patient taking differently: Take 25 mg by mouth 2 (two) times daily. 03/22/20  Yes Nahser, Wonda Cheng, MD  rosuvastatin (CRESTOR) 5 MG tablet Take 1 tablet (5 mg total) by mouth daily. 11/20/19  Yes Nahser, Wonda Cheng, MD  zolpidem (AMBIEN) 10 MG tablet Take 1 tablet (10 mg total) by mouth at bedtime as needed for sleep. Patient taking  differently: Take 10 mg by mouth at bedtime. 01/31/18  Yes Angiulli, Lavon Paganini, PA-C  acetaminophen (TYLENOL) 325 MG tablet Take 2 tablets (650 mg total) by mouth every 4 (four) hours as needed for headache or mild pain. Patient not taking: No sig reported 01/31/18   Angiulli, Lavon Paganini, PA-C  ALPRAZolam Duanne Moron) 0.25 MG tablet Take 1 tablet (0.25 mg total) by mouth 2 (two) times daily as needed for anxiety. Patient not taking: Reported on 04/07/2020 01/31/18   Cathlyn Parsons, PA-C    Allergies    Patient has no known allergies.  Review of Systems   Review of Systems  Cardiovascular: Positive for chest pain.  Musculoskeletal: Positive for back pain.  All other systems reviewed and are negative.   Physical Exam Updated Vital Signs BP 137/70   Pulse 72   Temp (!) 97.5 F (36.4 C) (Oral)   Resp (!) 29   Ht 5\' 10"  (1.778 m)   Wt 90 kg   SpO2 96%   BMI 28.47 kg/m   Physical Exam Vitals and nursing note reviewed.  Constitutional:      General: He is not in acute distress.    Appearance: Normal appearance. He is well-developed and well-nourished.  HENT:     Head: Normocephalic and atraumatic.     Right Ear: Hearing normal.     Left Ear: Hearing normal.     Nose: Nose normal.     Mouth/Throat:     Mouth: Oropharynx is clear and moist and mucous membranes are normal.  Eyes:     Extraocular Movements: EOM normal.     Conjunctiva/sclera: Conjunctivae normal.     Pupils: Pupils are equal, round, and reactive to light.  Cardiovascular:     Rate and Rhythm: Rhythm irregular.     Heart sounds: S1 normal and S2 normal. No  murmur heard. No friction rub. No gallop.   Pulmonary:     Effort: Pulmonary effort is normal. No respiratory distress.     Breath sounds: Normal breath sounds.  Chest:     Chest wall: No tenderness.  Abdominal:     General: Bowel sounds are normal.     Palpations: Abdomen is soft. There is no hepatosplenomegaly.     Tenderness: There is no abdominal tenderness. There is no guarding or rebound. Negative signs include Murphy's sign and McBurney's sign.     Hernia: No hernia is present.  Musculoskeletal:        General: Normal range of motion.     Cervical back: Normal range of motion and neck supple.     Thoracic back: Tenderness present.       Back:  Skin:    General: Skin is warm, dry and intact.     Findings: No rash.     Nails: There is no cyanosis.  Neurological:     Mental Status: He is alert and oriented to person, place, and time.     GCS: GCS eye subscore is 4. GCS verbal subscore is 5. GCS motor subscore is 6.     Cranial Nerves: No cranial nerve deficit.     Sensory: No sensory deficit.     Coordination: Coordination normal.     Deep Tendon Reflexes: Strength normal.  Psychiatric:        Mood and Affect: Mood and affect normal.        Speech: Speech normal.        Behavior: Behavior normal.  Thought Content: Thought content normal.     ED Results / Procedures / Treatments   Labs (all labs ordered are listed, but only abnormal results are displayed) Labs Reviewed  BASIC METABOLIC PANEL - Abnormal; Notable for the following components:      Result Value   Glucose, Bld 103 (*)    All other components within normal limits  CBC - Abnormal; Notable for the following components:   MCH 25.0 (*)    MCHC 29.9 (*)    All other components within normal limits  PROTIME-INR - Abnormal; Notable for the following components:   Prothrombin Time 15.6 (*)    INR 1.3 (*)    All other components within normal limits  TROPONIN I (HIGH SENSITIVITY)  TROPONIN I (HIGH  SENSITIVITY)    EKG EKG Interpretation  Date/Time:  Wednesday July 07 2020 02:07:01 EST Ventricular Rate:  76 PR Interval:    QRS Duration: 146 QT Interval:  408 QTC Calculation: 459 R Axis:   -50 Text Interpretation: Atrial fibrillation with a competing junctional pacemaker Left axis deviation Left bundle branch block Abnormal ECG Confirmed by Orpah Greek [phone]) on 07/07/2020 4:02:19 AM   Radiology DG Chest 2 View  Result Date: 07/07/2020 CLINICAL DATA:  Chest pain EXAM: CHEST - 2 VIEW COMPARISON:  01/27/2018 FINDINGS: Mild cardiomegaly. Colonic interposition beneath right hemidiaphragm, which is elevated. Bibasilar atelectasis. No focal consolidation. IMPRESSION: Bibasilar atelectasis without focal consolidation. Electronically Signed   By: Ulyses Jarred M.D.   On: 07/07/2020 02:58    Procedures Procedures   Medications Ordered in ED Medications  oxyCODONE-acetaminophen (PERCOCET/ROXICET) 5-325 MG per tablet 1 tablet (1 tablet Oral Given 07/07/20 0443)    ED Course  I have reviewed the triage vital signs and the nursing notes.  Pertinent labs & imaging results that were available during my care of the patient were reviewed by me and considered in my medical decision making (see chart for details).    MDM Rules/Calculators/A&P                          Patient presents to the emergency department for evaluation of upper back pain.  Patient reports that the pain has been present for a couple of weeks.  Symptoms appear to be musculoskeletal in nature.  He has tenderness in the left upper back area which worsens when he bends and twists.  This is the pain he has been experiencing at home.  He had some chest pain earlier but this has resolved and has not recurred here in the emergency department.  EKG reveals rate controlled atrial fibrillation with left bundle branch block, unchanged from previous.  Troponins negative x2.  Symptoms seem atypical for cardiac etiology,  very consistent with musculoskeletal etiology.  He feels better after analgesia.  Patient to be discharged, follow-up with primary care.  Final Clinical Impression(s) / ED Diagnoses Final diagnoses:  Nonspecific chest pain  Thoracic myofascial strain, initial encounter    Rx / DC Orders ED Discharge Orders    None       Dennys Guin, Gwenyth Allegra, MD 07/07/20 [phone]

## 2020-07-07 NOTE — Progress Notes (Deleted)
CARDIOLOGY OFFICE NOTE  Date:  07/07/2020    Christian Maxwell Date of Birth: Nov 20, 1932 Medical Record #[id number]  PCP:  Ria Bush, MD  Cardiologist: Nahser    No chief complaint on file.   History of Present Illness: Christian Maxwell is a 87 y.o. male who presents today for a work in visit. Seen for Dr. Acie Fredrickson.   He has a history of AF, HLF and PVCS. He has had some atypical chest wall pain in the past. He has had normal LV function. Low risk Myoview in 2017. His wife passed in 2018. He remains on Eliquis for anticoagulation. Mild carotid disease from doppler 01/2018.   Last seen in July - doing ok.   Presented earlier this morning to the ER with chest and back pain - seemed positional. Work up unremarkable.   Thus added to this schedule for today.   Comes in today. Here with   Past Medical History:  Diagnosis Date  . Abscess 7/13   abd abscess  . Cancer [facility])    colon cancer  . Chronic atrial fibrillation ([place])   . History of colon cancer 2000   T3N0 s/p colectomy  . History of echocardiogram    a. Echo 2/13:  Mild LVH, EF 55-60%, mild MR, moderate LAE, mild to moderate RAE;  b. Echo 1/17: mild LVH, vigorous LVF, EF 65-70%, no RWMA, trivial AI, trivial MR, severe LAE, mild RAE, mod TR, PASP 32 mmHg  . History of stress test    a. Cardiolite in 9/04: EF 67%, no ischemia.;  b. Myoview 6/16: no ischemia   . Hx of adenomatous colonic polyps   . Hyperlipidemia   . Hypertension   . METHICILLIN RESISTANT STAPHYLOCOCCUS AUREUS INFECTION 12/12/2006   Annotation: stitch abcess in lower abdomen after colon cancer resection Qualifier: Diagnosis of  By: [name] MD, [name]    . Myocardial infarction ([place]) 1985  . Postoperative stitch abscess 07/19/2011  . PVC's (premature ventricular contractions)   . Syncope and collapse    pt denies  . Thyroid disease    followed by [name]    Past Surgical History:  Procedure Laterality Date  . COLECTOMY  2000   6 inches  removed  . COLONOSCOPY  2009   polyp, h/o cancer [name])  . EYE SURGERY     cataract surgery both eyes- pts ates he has never had cataract surgery 10-26-15  . HERNIA REPAIR  05/9620   DB umbilical hernia  . INCISE AND DRAIN ABCESS  2009,2010,2011,2012  . INCISION AND DRAINAGE Right 04/02/2013   Procedure: INCISION AND DRAINAGE RIGHT KNEE HEMATOMA;  Surgeon: [name], MD;  Location: [place];  Service: Orthopedics;  Laterality: Right;  . INCISION AND DRAINAGE ABSCESS N/A 09/20/2012   Procedure: INCISION AND DRAINAGE SUPRAPUBIC ABSCESS;  Surgeon: [name], MD;  Location: [place];  Service: General;  Laterality: N/A;  . INCISION AND DRAINAGE ABSCESS Left 02/28/2013   Procedure: INCISION AND DRAINAGE LEFT ARM ABSCESS;  Surgeon: [name], MD;  Location: [place];  Service: General;  Laterality: Left;  . LUMBAR LAMINECTOMY  1977  . MASS EXCISION  03/07/2012   EXCISION MASS;  Surgeon: [name], MD;  Laterality: Right;  removal mass posterior right arm  . POLYPECTOMY    . WOUND EXPLORATION  2006   For possible stitch abscess Dr [name]     Medications: No outpatient medications have been marked as taking for the 07/07/20  encounter (Appointment) with Burtis Junes, NP.     Allergies: No Known Allergies  Social History: The patient  reports that he quit smoking about 42 years ago. He quit smokeless tobacco use about 42 years ago.  His smokeless tobacco use included chew. He reports that he does not drink alcohol and does not use drugs.   Family History: The patient's ***family history includes CAD (age of onset: 29) in his brother; CAD (age of onset: 28) in his father; Colon cancer in his cousin; Dementia in his brother and father; Dementia (age of onset: 47) in his sister; Diabetes in his brother; Hypertension in his father; Stroke in his brother, brother, father, and mother; Stroke (age of onset: 9) in his sister.   Review of  Systems: Please see the history of present illness.   All other systems are reviewed and negative.   Physical Exam: VS:  There were no vitals taken for this visit. [name]  BMI There is no height or weight on file to calculate BMI.  Wt Readings from Last 3 Encounters:  07/07/20 198 lb 6.6 oz (90 kg)  11/14/19 185 lb 9.6 oz (84.2 kg)  10/04/19 190 lb (86.2 kg)    General: Pleasant. Well developed, well nourished and in no acute distress.   HEENT: Normal.  Neck: Supple, no JVD, carotid bruits, or masses noted.  Cardiac: ***Regular rate and rhythm. No murmurs, rubs, or gallops. No edema.  Respiratory:  Lungs are clear to auscultation bilaterally with normal work of breathing.  GI: Soft and nontender.  MS: No deformity or atrophy. Gait and ROM intact.  Skin: Warm and dry. Color is normal.  Neuro:  Strength and sensation are intact and no gross focal deficits noted.  Psych: Alert, appropriate and with normal affect.   LABORATORY DATA:  EKG:  EKG {ACTION; IS/IS [id number]} ordered today.  Personally reviewed by me. This demonstrates ***.  Lab Results  Component Value Date   WBC 8.7 07/07/2020   HGB 14.4 07/07/2020   HCT 48.2 07/07/2020   PLT 218 07/07/2020   GLUCOSE 103 (H) 07/07/2020   CHOL 118 02/20/2020   TRIG 114 02/20/2020   HDL 40 02/20/2020   LDLCALC 57 02/20/2020   ALT 28 02/20/2020   AST 28 11/14/2019   NA 138 07/07/2020   K 3.9 07/07/2020   CL 104 07/07/2020   CREATININE 0.97 07/07/2020   BUN 16 07/07/2020   CO2 24 07/07/2020   TSH 1.66 11/05/2015   PSA 1.14 05/31/2010   INR 1.3 (H) 07/07/2020   HGBA1C 5.9 (H) 01/22/2018     BNP (last 3 results) No results for input(s): BNP in the last 8760 hours.  ProBNP (last 3 results) No results for input(s): PROBNP in the last 8760 hours.   Other Studies Reviewed Today:  Echo Study Conclusions 2019   - Left ventricle: The cavity size was normal. There was mild  concentric hypertrophy. Systolic function was  normal. The  estimated ejection fraction was in the range of 60% to 65%. Wall  motion was normal; there were no regional wall motion  abnormalities.  - Ventricular septum: Septal motion showed abnormal function and  dyssynchrony consistent with bundle branch block.  - Aortic valve: Transvalvular velocity was within the normal range.  There was no stenosis. There was trivial regurgitation.  - Mitral valve: Transvalvular velocity was within the normal range.  There was no evidence for stenosis. There was no regurgitation.  - Left atrium: The atrium was severely  dilated.  - Right ventricle: The cavity size was normal. Wall thickness was  normal. Systolic function was normal.  - Tricuspid valve: There was mild regurgitation.  - Pulmonary arteries: Systolic pressure was within the normal  range. PA peak pressure: 23 mm Hg (S).     Myoview Study Highlights 03/2016   Nuclear stress EF: 60%.  The left ventricular ejection fraction is normal (55-65%).  This is a low risk study.  Normal perfusion No ischemia     Assessment/Plan:  1. Atypical chest pain  2. Persistent AF -   3. Chronic anticoagulation  4. HLD - on statin   Current medicines are reviewed with the patient today.  The patient does not have concerns regarding medicines other than what has been noted above.  The following changes have been made:  See above.  Labs/ tests ordered today include:   No orders of the defined types were placed in this encounter.    Disposition:   FU with *** in {gen number [id number]} {Days to years:10300}.   Patient is agreeable to this plan and will call if any problems develop in the interim.   SignedTruitt Merle, NP  07/07/2020 8:50 AM  [facility] [address] [place], [place]  [address] Phone: [phone] Fax: [phone]

## 2020-07-07 NOTE — ED Triage Notes (Signed)
Patient reports intermittent mid chest pain/pressure radiating to right upper back this week , no SOB , denies emesis or diaphoresis , no cough or fever , his cardiologist is Dr. Cathie Olden.

## 2020-07-07 NOTE — ED Triage Notes (Signed)
Patient reports to the ER for chest pain and SOB. Patient reportedly has a hx of dementia and was seen yesterday at [facility].  Patient was discharged and does not remember what was told to him.  Patient is a client of [facility] and receives 4 hours of daily care. Patient BIB [facility] associate.  Patient had a fall x2 days ago.

## 2020-07-07 NOTE — Discharge Instructions (Addendum)
The CAT scan today did not now any problems.  No blood clot, pneumonia or rib fractures.  Head CAT scan was normal.  Labs without  abnormalities.  This could be related to the fall or pain of the lining of the lung.  Take tylenol at home and try voltaren gel or lidocaine patches which are both over the counter.  If you start having fever, confusion, weakness, vomiting, severe shortness of breath or other concerns return to the ER

## 2020-07-07 NOTE — ED Notes (Signed)
Patient in CT. Will update vitals when patient returns

## 2020-07-08 ENCOUNTER — Telehealth: Payer: Self-pay | Admitting: Cardiovascular Disease

## 2020-07-08 DIAGNOSIS — R410 Disorientation, unspecified: Secondary | ICD-10-CM | POA: Diagnosis not present

## 2020-07-08 DIAGNOSIS — R0902 Hypoxemia: Secondary | ICD-10-CM | POA: Diagnosis not present

## 2020-07-08 DIAGNOSIS — I447 Left bundle-branch block, unspecified: Secondary | ICD-10-CM | POA: Diagnosis not present

## 2020-07-08 DIAGNOSIS — R531 Weakness: Secondary | ICD-10-CM | POA: Diagnosis not present

## 2020-07-08 NOTE — Telephone Encounter (Signed)
STAT if patient feels like he/she is going to faint   1) Are you dizzy now? [name] states she is not currently with the patient, but around lunch his caregiver reported he was dizzy  2) Do you feel faint or have you passed out? [name] is unsure.  3) Do you have any other symptoms? Chest pain  4) Have you checked your HR and BP (record if available)?  [name] states she does not have a log of the patient's readings, but the patient's caregiver has been taking it and she has not reported any abnormalities, so she assumes his BP and HR have been normal   Pt c/o of Chest Pain: STAT if CP now or developed within 24 hours  1. Are you having CP right now? Unsure. [name] states she is not currently with the patient  2. Are you experiencing any other symptoms (ex. SOB, nausea, vomiting, sweating)? No   3. How long have you been experiencing CP? Past few days, per patient's daughter   22. Is your CP continuous or coming and going? Continuous   5. Have you taken Nitroglycerin? No, daughter states he does not have any nitro ?

## 2020-07-08 NOTE — Telephone Encounter (Signed)
Left message to call back  

## 2020-07-09 ENCOUNTER — Ambulatory Visit (INDEPENDENT_AMBULATORY_CARE_PROVIDER_SITE_OTHER): Payer: Medicare Other | Admitting: Physician Assistant

## 2020-07-09 ENCOUNTER — Other Ambulatory Visit: Payer: Self-pay

## 2020-07-09 ENCOUNTER — Encounter: Payer: Self-pay | Admitting: Physician Assistant

## 2020-07-09 VITALS — BP 131/82 | HR 98 | Ht 70.0 in | Wt 172.0 lb

## 2020-07-09 DIAGNOSIS — R0789 Other chest pain: Secondary | ICD-10-CM

## 2020-07-09 DIAGNOSIS — I10 Essential (primary) hypertension: Secondary | ICD-10-CM

## 2020-07-09 DIAGNOSIS — I2510 Atherosclerotic heart disease of native coronary artery without angina pectoris: Secondary | ICD-10-CM

## 2020-07-09 DIAGNOSIS — I482 Chronic atrial fibrillation, unspecified: Secondary | ICD-10-CM | POA: Diagnosis not present

## 2020-07-09 DIAGNOSIS — R0602 Shortness of breath: Secondary | ICD-10-CM

## 2020-07-09 MED ORDER — IBUPROFEN 400 MG PO TABS: 400.0000 mg | ORAL_TABLET | Freq: Two times a day (BID) | ORAL | 0 refills | Status: DC

## 2020-07-09 MED ORDER — ASPIRIN EC 81 MG PO TBEC: 81.0000 mg | DELAYED_RELEASE_TABLET | Freq: Every day | ORAL | 3 refills | Status: DC

## 2020-07-09 NOTE — Telephone Encounter (Signed)
See my OV note from 07/09/2020. Richardson Dopp, PA-C    07/09/2020 1:15 PM

## 2020-07-09 NOTE — Progress Notes (Signed)
Cardiology Office Note:    Date:  07/09/2020   ID:  [name], DOB 1932-08-21, MRN [id number]  PCP:  Christian Maxwell, [facility]  Cardiologist:  Christian Moores, MD   Advanced Practice Provider:  No care team member to display Electrophysiologist:  None       Referring MD: Christian Bush, MD   Chief Complaint:  Chest Pain and Shortness of Breath    Patient Profile:    Christian Maxwell is a 87 y.o. male with:   Permanent atrial fibrillation   Echocardiogram 9/19: EF 60-65  S/p intracranial hemorrhage 01/2018 (hx of falling)  Apixaban resumed after hemorrhage resolved   Coronary Ca2+ on Chest CT 06/2020  Hypertension   Hyperlipidemia   LBBB  Hypothyroidism   Colon CA s/p colectomy  Hx of chronic chest pain   Myoview in 6/16: low risk   Myoview 11/17: low risk   Dementia   Aortic atherosclerosis   Prior CV studies:   Chest CTA 07/07/20 IMPRESSION: 1. No evidence of pulmonary embolism. 2. Bibasilar atelectasis. Mild diffuse bronchial wall thickening, suggestive of small airways disease. 3. Cholelithiasis. 4. Similar appearance of the T1 and T4 compression deformities. 5. Coronary artery calcifications. Aortic atherosclerosis.  Aortic Atherosclerosis (ICD10-I70.0).   Echocardiogram 01/21/18 Mild con LVH, EF 60-65, no RWMA, trivial AI, severe LAE, normal RVSF, mild TR, PASP 23  Carotid US 01/31/18 Bilat ICA 1-39  Myoview 04/11/16 EF 60, no ischemia; low risk   Echo 06/14/15 Mild LVH, EF 65-70%, normal wall motion, trivial AI/MR, severe LAE, mild RAE, moderate TR, PASP 32 mmHg  Echo 06/2011:  Mild LVH, EF 55-60%, mild MR, moderate LAE, mild to moderate RAE.  Myoview 11/02/14 No ischemia. Not gated   History of Present Illness:    Christian Maxwell was last seen by Dr. Acie Maxwell in 11/2019.    He was seen in the [facility] ED 07/07/20 with chest pain. ED physician's notes reviewed.  Symptoms were fel tto be non-cardiac and  he was treated for MSK pain with improved symptoms.   Data: K+ 3.9, SCr 0.97, Hgb 14.4, INR 1.3, hsTroponon 10>>10; CXR: basilar ATX, no consolidation ECG personally reviewed - AFib, HR 76, LBBB  He went to [facility] later in the day on 07/07/20 with symptoms of chest pain.  ED physician's notes reviewed.  Pt had an unwitnessed fall prior to the pain starting.   Data: hsTrop 8; Head CT: no acute abnormality; Chest CT: no PE, ATX but no evidence of pneumonia  EKG: AFib, HR 88, LBBB  The patient's daughter called the office today due to concerns about current symptoms.  The patient continues to have chest pain and dizziness.  He is added on to my schedule for evaluation.  He is here today with his aide.  He notes he has been having substernal chest discomfort for the past 2 to 3 weeks.  It is fairly constant.  He has worsening pain with positional changes as well as with taking a deep breath.  He has not had any fevers, chills, cough.  He does not have worsening chest discomfort with lying supine.  He feels somewhat out of breath.  This seems to be more related to taking shallow breaths due to chest pain.  He denies any injury to his chest.  He did fall almost 1 week ago.  He denies hitting his head or chest at that time.  He has not had orthopnea, leg edema  or syncope.  He denies any symptoms of dizziness.  His aide does note complaints of dizziness recently.      Past Medical History:  Diagnosis Date  . Abscess 7/13   abd abscess  . Cancer [facility])    colon cancer  . Chronic atrial fibrillation ([place])   . History of colon cancer 2000   T3N0 s/p colectomy  . History of echocardiogram    a. Echo 2/13:  Mild LVH, EF 55-60%, mild MR, moderate LAE, mild to moderate RAE;  b. Echo 1/17: mild LVH, vigorous LVF, EF 65-70%, no RWMA, trivial AI, trivial MR, severe LAE, mild RAE, mod TR, PASP 32 mmHg  . History of stress test    a. Cardiolite in 9/04: EF 67%, no ischemia.;  b. Myoview 6/16: no ischemia    . Hx of adenomatous colonic polyps   . Hyperlipidemia   . Hypertension   . METHICILLIN RESISTANT STAPHYLOCOCCUS AUREUS INFECTION 12/12/2006   Annotation: stitch abcess in lower abdomen after colon cancer resection Qualifier: Diagnosis of  By: Christian Sima MD, [name]    . Myocardial infarction ([place]) 1985  . Postoperative stitch abscess 07/19/2011  . PVC's (premature ventricular contractions)   . Syncope and collapse    pt denies  . Thyroid disease    followed by Dr. Forde Maxwell    Current Medications: Current Meds  Medication Sig  . aspirin EC 81 MG tablet Take 1 tablet (81 mg total) by mouth daily. Swallow whole.  [name] atorvastatin (LIPITOR) 40 MG tablet Take 1 tablet (40 mg total) by mouth daily at 6 PM.  . ELIQUIS 5 MG TABS tablet TAKE 1 TABLET BY MOUTH TWICE A DAY  . hydroxypropyl methylcellulose / hypromellose (ISOPTO TEARS / GONIOVISC) 2.5 % ophthalmic solution Place 1 drop into both eyes in the morning and at bedtime.  [name] ibuprofen (ADVIL) 400 MG tablet Take 1 tablet (400 mg total) by mouth 2 (two) times daily. Take for 3 days only then stop.  . metoprolol tartrate (LOPRESSOR) 25 MG tablet TAKE 1 TABLET BY MOUTH TWICE A DAY  . rosuvastatin (CRESTOR) 5 MG tablet Take 1 tablet (5 mg total) by mouth daily.  [name] zolpidem (AMBIEN) 10 MG tablet Take 1 tablet (10 mg total) by mouth at bedtime as needed for sleep.  . [DISCONTINUED] aspirin EC 81 MG tablet Take 81 mg by mouth in the morning and at bedtime. Swallow whole.     Allergies:   Patient has no known allergies.   Social History   Tobacco Use  . Smoking status: Former Smoker    Quit date: 10/13/1977    Years since quitting: 42.7  . Smokeless tobacco: Former Systems developer    Types: [place] date: 10/13/1977  Vaping Use  . Vaping Use: Never used  Substance Use Topics  . Alcohol use: No    Alcohol/week: 0.0 standard drinks  . Drug use: No     Family Hx: The patient's family history includes CAD (age of onset: 19) in his brother; CAD (age of  onset: 23) in his father; Colon cancer in his cousin; Dementia in his brother and father; Dementia (age of onset: 42) in his sister; Diabetes in his brother; Hypertension in his father; Stroke in his brother, brother, father, and mother; Stroke (age of onset: 27) in his sister. There is no history of Cancer, Colon polyps, Rectal cancer, or Stomach cancer.  Review of Systems  Constitutional: Negative for chills, fever, weight gain and weight loss.  Cardiovascular:  Positive for chest pain. Negative for orthopnea.  Respiratory: Positive for shortness of breath. Negative for cough and hemoptysis.   Gastrointestinal: Negative for diarrhea, dysphagia, heartburn, hematemesis, hematochezia, melena, nausea and vomiting.  Genitourinary: Negative for hematuria.  All other systems reviewed and are negative.    EKGs/Labs/Other Test Reviewed:    EKG:  EKG is   ordered today.  The ekg ordered today demonstrates Atrial fibrillation, HR 98, left bundle branch block, no change from prior tracing  Recent Labs: 02/20/2020: ALT 28 07/07/2020: BUN 19; Creatinine, Ser 0.92; Hemoglobin 14.6; Platelets 222; Potassium 3.9; Sodium 138   Recent Lipid Panel Lab Results  Component Value Date/Time   CHOL 118 02/20/2020 11:00 AM   TRIG 114 02/20/2020 11:00 AM   HDL 40 02/20/2020 11:00 AM   CHOLHDL 3.0 02/20/2020 11:00 AM   CHOLHDL 4.8 01/22/2018 06:42 AM   LDLCALC 57 02/20/2020 11:00 AM      Risk Assessment/Calculations:    CHA2DS2-VASc Score = 4  This indicates a 4.8% annual risk of stroke. The patient's score is based upon: CHF History: No HTN History: Yes Diabetes History: No Stroke History: No Vascular Disease History: Yes Age Score: 2 Gender Score: 0     Physical Exam:    VS:  BP 131/82   Pulse 98   Ht 5\' 10"  (1.778 m)   Wt 172 lb (78 kg)   BMI 24.68 kg/m     Wt Readings from Last 3 Encounters:  07/09/20 172 lb (78 kg)  07/07/20 182 lb (82.6 kg)  07/07/20 198 lb 6.6 oz (90 kg)      Constitutional:      Appearance: Healthy appearance. Not in distress.  Neck:     Vascular: No JVR. JVD normal.     Lymphadenopathy: No cervical adenopathy.  Pulmonary:     Effort: Pulmonary effort is normal.     Breath sounds: No wheezing. No rales.  Chest:     Chest wall: Not tender to palpatation.  Cardiovascular:     Normal rate. Regular rhythm. Normal S1. Normal S2.     Murmurs: There is no murmur.  Edema:    Peripheral edema absent.  Abdominal:     General: There is no distension.     Palpations: Abdomen is soft. There is no hepatomegaly.     Tenderness: There is no abdominal tenderness.  Skin:    General: Skin is warm and dry.  Neurological:     General: No focal deficit present.     Mental Status: Alert and oriented to person, place and time.     Cranial Nerves: Cranial nerves are intact.        ASSESSMENT & PLAN:    1. Other chest pain 2. SOB (shortness of breath) He seems to have chest wall pain.  Etiology is not entirely clear.  He denies any injury.  He has not had any infectious symptoms.  He has not had a cough.  His CT did not show any evidence of pulmonary embolism.  There was no evidence of consolidation, mediastinal adenopathy or pericardial effusion.  His chest x-ray was also clear aside from atelectasis.  His labs have all been unremarkable and very reassuring.  I reviewed all of this with him today.  He does not really have classic symptoms of pericarditis.  His aide did ask about getting Covid tested.  He does not really have any symptoms of Covid.  With normal troponins, I do not think he has had myocarditis.  To be complete, I will obtain a CRP and sed rate.  I will also obtain a BNP.  Arrange 2D echocardiogram.  I have recommended that he use ibuprofen 400 mg twice daily for 3 days.  He should not continue ibuprofen longer than 3 days.  He knows to take it with food.  He should follow-up with primary care next week.  I will have him see Dr. Acie Maxwell back in 4  weeks.  3. Chronic atrial fibrillation (HCC) Rate is somewhat elevated today.  It is usually well controlled on his current medical regimen.  Continue current dose of metoprolol tartrate, apixaban.  4. Essential hypertension The patient's blood pressure is controlled on his current regimen.  Continue current therapy.   5.  Coronary calcification This was noted on recent CT.  However, he does not describe symptoms consistent with angina.  His symptoms are noncardiac in more consistent with musculoskeletal etiology.  At this point, he does not need ischemic testing.  Dispo:  Return in about 4 weeks (around 08/06/2020) for Routine Follow Up, w/ Dr. Acie Maxwell, in person.   Medication Adjustments/Labs and Tests Ordered: Current medicines are reviewed at length with the patient today.  Concerns regarding medicines are outlined above.  Tests Ordered: Orders Placed This Encounter  Procedures  . C-reactive protein  . Sedimentation rate  . Pro b natriuretic peptide  . EKG 12-Lead  . ECHOCARDIOGRAM COMPLETE   Medication Changes: Meds ordered this encounter  Medications  . ibuprofen (ADVIL) 400 MG tablet    Sig: Take 1 tablet (400 mg total) by mouth 2 (two) times daily. Take for 3 days only then stop.    Dispense:  30 tablet    Refill:  0  . aspirin EC 81 MG tablet    Sig: Take 1 tablet (81 mg total) by mouth daily. Swallow whole.    Dispense:  90 tablet    Refill:  3    Signed, Richardson Dopp, PA-C  07/09/2020 12:27 PM    [facility] [address], [place], [place]  [address] Phone: [phone]; Fax: [phone]

## 2020-07-09 NOTE — Telephone Encounter (Signed)
Spoke with patients daughter who is not currently with the patient. She has minimal information but states that patients caregiver has been reporting that the patient experiences dizzy spells almost daily. He has been to the hospital multiple times recently due to not feeling like himself. Patient has intermittent chest pain at times, seems to be non exertional. Patient not currently experiencing CP per daughter. Patient has difficulty sleeping d/t SOB. No BP, HR or weights available to document at this time. Patients daughter states that patient really needs to be seen in office to be evaluated. She is not sure exactly what is wrong but she knows that something is not right with her father. Appointment made with SW this morning 10:45 am

## 2020-07-09 NOTE — Patient Instructions (Signed)
Medication Instructions:   TAKE IBUPROFEN 400 MG BY MOUTH TWICE DAILY FOR 3 DAYS ONLY, THEN STOP.  YOU CAN PURCHASE THIS OVER-THE-COUNTER AT ANY PHARMACY.  DECREASE YOUR ASPIRIN TO TAKING 81 MG BY MOUTH DAILY  *If you need a refill on your cardiac medications before your next appointment, please call your pharmacy*    Lab Work:  TODAY--CRP, ESR, AND PRO-BNP  If you have labs (blood work) drawn today and your tests are completely normal, you will receive your results only by: [name] MyChart Message (if you have MyChart) OR . A paper copy in the mail If you have any lab test that is abnormal or we need to change your treatment, we will call you to review the results.   Testing/Procedures:  Your physician has requested that you have an echocardiogram. Echocardiography is a painless test that uses sound waves to create images of your heart. It provides your doctor with information about the size and shape of your heart and how well your heart's chambers and valves are working. This procedure takes approximately one hour. There are no restrictions for this procedure.    Follow-Up:  WITH DR. Acie Fredrickson IN THE OFFICE IN 3-4 WEEKS    Other Instructions  PLEASE CALL YOUR PRIMARY CARE DOCTOR'S OFFICE AND SCHEDULE A FOLLOW-UP APPOINTMENT WITH THEM FOR NEXT WEEK, PER SCOTT WEAVER PA-C.

## 2020-07-09 NOTE — Telephone Encounter (Signed)
Daughter of patient returning call from yesterday.

## 2020-07-10 LAB — PRO B NATRIURETIC PEPTIDE: NT-Pro BNP: 747 pg/mL — ABNORMAL HIGH (ref 0–486)

## 2020-07-10 LAB — SEDIMENTATION RATE: Sed Rate: 44 mm/hr — ABNORMAL HIGH (ref 0–30)

## 2020-07-10 LAB — C-REACTIVE PROTEIN: CRP: 62 mg/L — ABNORMAL HIGH (ref 0–10)

## 2020-07-10 MED ORDER — COLCHICINE 0.6 MG PO TABS: 0.6000 mg | ORAL_TABLET | Freq: Every day | ORAL | 2 refills | Status: DC

## 2020-07-10 NOTE — Addendum Note (Signed)
Addended byKathlen Mody, Nicki Reaper T on: 07/10/2020 09:57 AM   Modules accepted: Orders

## 2020-07-14 ENCOUNTER — Encounter (HOSPITAL_COMMUNITY): Payer: Self-pay | Admitting: Physician Assistant

## 2020-07-14 ENCOUNTER — Ambulatory Visit (HOSPITAL_COMMUNITY): Payer: Medicare Other | Attending: Cardiology

## 2020-07-16 DIAGNOSIS — I480 Paroxysmal atrial fibrillation: Secondary | ICD-10-CM | POA: Diagnosis not present

## 2020-07-16 DIAGNOSIS — I2510 Atherosclerotic heart disease of native coronary artery without angina pectoris: Secondary | ICD-10-CM | POA: Diagnosis not present

## 2020-07-16 DIAGNOSIS — K8020 Calculus of gallbladder without cholecystitis without obstruction: Secondary | ICD-10-CM | POA: Diagnosis not present

## 2020-07-16 DIAGNOSIS — E0500 Thyrotoxicosis with diffuse goiter without thyrotoxic crisis or storm: Secondary | ICD-10-CM | POA: Diagnosis not present

## 2020-07-16 DIAGNOSIS — R2681 Unsteadiness on feet: Secondary | ICD-10-CM | POA: Diagnosis not present

## 2020-07-16 DIAGNOSIS — I700 Atherosclerosis of aorta: Secondary | ICD-10-CM | POA: Diagnosis not present

## 2020-07-16 DIAGNOSIS — I618 Other nontraumatic intracerebral hemorrhage: Secondary | ICD-10-CM | POA: Diagnosis not present

## 2020-07-16 DIAGNOSIS — Z7901 Long term (current) use of anticoagulants: Secondary | ICD-10-CM | POA: Diagnosis not present

## 2020-07-16 DIAGNOSIS — E785 Hyperlipidemia, unspecified: Secondary | ICD-10-CM | POA: Diagnosis not present

## 2020-07-16 DIAGNOSIS — F0280 Dementia in other diseases classified elsewhere without behavioral disturbance: Secondary | ICD-10-CM | POA: Diagnosis not present

## 2020-07-16 DIAGNOSIS — I6523 Occlusion and stenosis of bilateral carotid arteries: Secondary | ICD-10-CM | POA: Diagnosis not present

## 2020-07-17 ENCOUNTER — Other Ambulatory Visit: Payer: Self-pay | Admitting: Cardiovascular Disease

## 2020-07-19 NOTE — Telephone Encounter (Signed)
Eliquis 5mg  refill request received. Patient is 87 years old, weight-78kg, Crea-0.92 on 07/07/20, Diagnosis-Afib, and last seen by Richardson Dopp on 07/09/20. Dose is appropriate based on dosing criteria. Will send in refill to requested pharmacy.

## 2020-07-21 ENCOUNTER — Telehealth: Payer: Self-pay

## 2020-07-21 NOTE — Telephone Encounter (Signed)
Pt is aware of his results and his upcoming appt with MD later this month. I have confirmed with PA that this is an acceptable return time for f/u. I have left pt's daughter a message with this appt info.

## 2020-07-22 ENCOUNTER — Emergency Department (HOSPITAL_COMMUNITY): Payer: Medicare Other

## 2020-07-22 ENCOUNTER — Other Ambulatory Visit: Payer: Self-pay

## 2020-07-22 ENCOUNTER — Emergency Department (HOSPITAL_COMMUNITY)
Admission: EM | Admit: 2020-07-22 | Discharge: 2020-07-22 | Disposition: A | Discharge status: Home / Self Care | Payer: Medicare Other | Attending: Emergency Medicine | Admitting: Emergency Medicine

## 2020-07-22 DIAGNOSIS — Z87891 Personal history of nicotine dependence: Secondary | ICD-10-CM | POA: Diagnosis not present

## 2020-07-22 DIAGNOSIS — K760 Fatty (change of) liver, not elsewhere classified: Secondary | ICD-10-CM | POA: Diagnosis not present

## 2020-07-22 DIAGNOSIS — K5900 Constipation, unspecified: Secondary | ICD-10-CM | POA: Insufficient documentation

## 2020-07-22 DIAGNOSIS — R110 Nausea: Secondary | ICD-10-CM | POA: Diagnosis present

## 2020-07-22 DIAGNOSIS — Z7982 Long term (current) use of aspirin: Secondary | ICD-10-CM | POA: Insufficient documentation

## 2020-07-22 DIAGNOSIS — Z7901 Long term (current) use of anticoagulants: Secondary | ICD-10-CM | POA: Diagnosis not present

## 2020-07-22 DIAGNOSIS — F0390 Unspecified dementia without behavioral disturbance: Secondary | ICD-10-CM | POA: Diagnosis not present

## 2020-07-22 DIAGNOSIS — I10 Essential (primary) hypertension: Secondary | ICD-10-CM | POA: Insufficient documentation

## 2020-07-22 DIAGNOSIS — Z79899 Other long term (current) drug therapy: Secondary | ICD-10-CM | POA: Insufficient documentation

## 2020-07-22 DIAGNOSIS — Z85038 Personal history of other malignant neoplasm of large intestine: Secondary | ICD-10-CM | POA: Insufficient documentation

## 2020-07-22 DIAGNOSIS — R0902 Hypoxemia: Secondary | ICD-10-CM | POA: Diagnosis not present

## 2020-07-22 LAB — COMPREHENSIVE METABOLIC PANEL
ALT: 22 U/L (ref 0–44)
AST: 29 U/L (ref 15–41)
Albumin: 3.3 g/dL — ABNORMAL LOW (ref 3.5–5.0)
Alkaline Phosphatase: 85 U/L (ref 38–126)
Anion gap: 8 (ref 5–15)
BUN: 19 mg/dL (ref 8–23)
CO2: 29 mmol/L (ref 22–32)
Calcium: 9.8 mg/dL (ref 8.9–10.3)
Chloride: 105 mmol/L (ref 98–111)
Creatinine, Ser: 1.01 mg/dL (ref 0.61–1.24)
GFR, Estimated: >60 mL/min (ref >60)
Glucose, Bld: 96 mg/dL (ref 70–99)
Potassium: 3.4 mmol/L — ABNORMAL LOW (ref 3.5–5.1)
Sodium: 142 mmol/L (ref 135–145)
Total Bilirubin: 1.2 mg/dL (ref 0.3–1.2)
Total Protein: 6.5 g/dL (ref 6.5–8.1)

## 2020-07-22 LAB — URINALYSIS, ROUTINE W REFLEX MICROSCOPIC
Bilirubin Urine: NEGATIVE
Glucose, UA: NEGATIVE mg/dL
Hgb urine dipstick: NEGATIVE
Ketones, ur: NEGATIVE mg/dL
Leukocytes,Ua: NEGATIVE
Nitrite: NEGATIVE
Protein, ur: NEGATIVE mg/dL
Specific Gravity, Urine: 1.019 (ref 1.005–1.030)
pH: 5 (ref 5.0–8.0)

## 2020-07-22 LAB — CBC WITH DIFFERENTIAL/PLATELET
Abs Immature Granulocytes: 0.01 10*3/uL (ref 0.00–0.07)
Basophils Absolute: 0.1 10*3/uL (ref 0.0–0.1)
Basophils Relative: 1 %
Eosinophils Absolute: 0.1 10*3/uL (ref 0.0–0.5)
Eosinophils Relative: 1 %
HCT: 47.8 % (ref 39.0–52.0)
Hemoglobin: 14.4 g/dL (ref 13.0–17.0)
Immature Granulocytes: 0 %
Lymphocytes Relative: 21 %
Lymphs Abs: 1.4 10*3/uL (ref 0.7–4.0)
MCH: 24.9 pg — ABNORMAL LOW (ref 26.0–34.0)
MCHC: 30.1 g/dL (ref 30.0–36.0)
MCV: 82.6 fL (ref 80.0–100.0)
Monocytes Absolute: 0.8 10*3/uL (ref 0.1–1.0)
Monocytes Relative: 12 %
Neutro Abs: 4.3 10*3/uL (ref 1.7–7.7)
Neutrophils Relative %: 65 %
Platelets: 227 10*3/uL (ref 150–400)
RBC: 5.79 MIL/uL (ref 4.22–5.81)
RDW: 17.5 % — ABNORMAL HIGH (ref 11.5–15.5)
WBC: 6.7 10*3/uL (ref 4.0–10.5)
nRBC: 0 % (ref 0.0–0.2)

## 2020-07-22 MED ORDER — MAGNESIUM CITRATE PO SOLN: 1.0000 | Freq: Once | ORAL | 0 refills | Status: AC

## 2020-07-22 MED ORDER — POLYETHYLENE GLYCOL 3350 17 G PO PACK: 17.0000 g | PACK | Freq: Every day | ORAL | 0 refills | Status: DC

## 2020-07-22 MED ORDER — IOHEXOL 300 MG/ML  SOLN
100.0000 mL | Freq: Once | INTRAMUSCULAR | Status: AC | PRN
  Administered 2020-07-22: 100 mL via INTRAVENOUS

## 2020-07-22 NOTE — ED Provider Notes (Signed)
[facility] EMER[facility]ENC[facility] DEPARTMENT Provider Note   CSN: [id number] Arrival date & time: 07/22/20  0251     History Chief Complaint  Patient presents with  . Constipation    Christian Maxwell is a 87 y.o. male.  History of dementia but states has had constipation last 4 to 5 weeks but specifically last 5 to 6 days is not a single bowel movement.  States he is a surgical his abdomen before he does not know why.  States he has passed gas once in a while.  Some nausea no vomiting.  No fevers.  No sick contacts.  No changes in medications.  Patient states he eats and drinks normally goes to [employer] every day.   Constipation      Past Medical History:  Diagnosis Date  . Abscess 7/13   abd abscess  . Cancer [facility])    colon cancer  . Chronic atrial fibrillation ([place])   . History of colon cancer 2000   T3N0 s/p colectomy  . History of echocardiogram    a. Echo 2/13:  Mild LVH, EF 55-60%, mild MR, moderate LAE, mild to moderate RAE;  b. Echo 1/17: mild LVH, vigorous LVF, EF 65-70%, no RWMA, trivial AI, trivial MR, severe LAE, mild RAE, mod TR, PASP 32 mmHg  . History of stress test    a. Cardiolite in 9/04: EF 67%, no ischemia.;  b. Myoview 6/16: no ischemia   . Hx of adenomatous colonic polyps   . Hyperlipidemia   . Hypertension   . METHICILLIN RESISTANT STAPHYLOCOCCUS AUREUS INFECTION 12/12/2006   Annotation: stitch abcess in lower abdomen after colon cancer resection Qualifier: Diagnosis of  By: [name] MD, [name]    . Myocardial infarction ([place]) 1985  . Postoperative stitch abscess 07/19/2011  . PVC's (premature ventricular contractions)   . Syncope and collapse    pt denies  . Thyroid disease    followed by [name]    Patient Active Problem List   Diagnosis Date Noted  . Benign essential HTN   . Sleep disturbance   . SOB (shortness of breath)   . Transaminitis   . Prediabetes   . Thalamic hemorrhage ([place]) 01/23/2018  . Cerebral hemorrhage ([place])   .  Essential hypertension   . Dyslipidemia   . Dementia without behavioral disturbance ([place]) 01/22/2018  . Ataxia 01/21/2018  . Bleeding in brain ([place]) 01/21/2018  . Hypoxia 01/21/2018  . Chest pain 01/20/2018  . Hearing impairment 11/11/2015  . Olecranon bursitis of left elbow 11/11/2015  . LBBB (left bundle branch block) 05/27/2015  . Senile purpura ([place]) 02/22/2015  . Wound, open, arm, forearm 02/22/2015  . Advanced care planning/counseling discussion 11/06/2014  . Medicare annual wellness visit, initial 11/06/2014  . Bilateral knee pain 03/17/2014  . DOE (dyspnea on exertion) 02/16/2014  . Memory impairment 03/06/2013  . MRSA colonization 12/12/2012  . Nevus of sternal region 12/04/2012  . Seasonal allergic rhinitis 10/22/2012  . Thyroid disease   . Suprapubic abscess 08/21/2012  . H/O colon cancer, stage I 06/24/2012  . Adenomatous colon polyp 06/24/2012  . Open wound-left arm and suprapubic area. 03/11/2012  . Chest discomfort 04/21/2011  . Hyperlipidemia 10/14/2010  . Atrial fibrillation ([place]) 08/08/2010  . INSOMNIA, MILD 12/12/2006    Past Surgical History:  Procedure Laterality Date  . COLECTOMY  2000   6 inches removed  . COLONOSCOPY  2009   polyp, h/o cancer [name])  . EYE SURGERY     cataract  surgery both eyes- pts ates he has never had cataract surgery 10-26-15  . HERNIA REPAIR  05/6107   DB umbilical hernia  . INCISE AND DRAIN ABCESS  2009,2010,2011,2012  . INCISION AND DRAINAGE Right 04/02/2013   Procedure: INCISION AND DRAINAGE RIGHT KNEE HEMATOMA;  Surgeon: [name], MD;  Location: [facility];  Service: Orthopedics;  Laterality: Right;  . INCISION AND DRAINAGE ABSCESS N/A 09/20/2012   Procedure: INCISION AND DRAINAGE SUPRAPUBIC ABSCESS;  Surgeon: [name], MD;  Location: [facility];  Service: General;  Laterality: N/A;  . INCISION AND DRAINAGE ABSCESS Left 02/28/2013   Procedure: INCISION AND DRAINAGE LEFT ARM ABSCESS;   Surgeon: [name], MD;  Location: [facility];  Service: General;  Laterality: Left;  . LUMBAR LAMINECTOMY  1977  . MASS EXCISION  03/07/2012   EXCISION MASS;  Surgeon: [name], MD;  Laterality: Right;  removal mass posterior right arm  . POLYPECTOMY    . WOUND EXPLORATION  2006   For possible stitch abscess Dr [name]       Family History  Problem Relation Age of Onset  . Stroke Mother   . Stroke Father   . Hypertension Father   . CAD Father 65       MI  . Dementia Father   . Stroke Sister 5  . Stroke Brother   . Stroke Brother   . Diabetes Brother   . CAD Brother 83       MI  . Dementia Sister 37  . Dementia Brother   . Colon cancer Cousin        x4 total with colon cancer  . Cancer Neg Hx   . Colon polyps Neg Hx   . Rectal cancer Neg Hx   . Stomach cancer Neg Hx     Social History   Tobacco Use  . Smoking status: Former Smoker    Quit date: 10/13/1977    Years since quitting: 42.8  . Smokeless tobacco: Former Systems developer    Types: [place] date: 10/13/1977  Vaping Use  . Vaping Use: Never used  Substance Use Topics  . Alcohol use: No    Alcohol/week: 0.0 standard drinks  . Drug use: No    Home Medications Prior to Admission medications   Medication Sig Start Date End Date Taking? Authorizing Provider  aspirin EC 81 MG tablet Take 1 tablet (81 mg total) by mouth daily. Swallow whole. 07/09/20   Richardson Dopp T, PA-C  colchicine 0.6 MG tablet Take 1 tablet (0.6 mg total) by mouth daily. 07/10/20 10/08/20  Richardson Dopp T, PA-C  ELIQUIS 5 MG TABS tablet TAKE 1 TABLET BY MOUTH TWICE A DAY 07/19/20   Nahser, Wonda Cheng, MD  hydroxypropyl methylcellulose / hypromellose (ISOPTO TEARS / GONIOVISC) 2.5 % ophthalmic solution Place 1 drop into both eyes in the morning and at bedtime.    [provider]  ibuprofen (ADVIL) 400 MG tablet Take 1 tablet (400 mg total) by mouth 2 (two) times daily. Take for 3 days only then stop. 07/09/20   Richardson Dopp T, PA-C   metoprolol tartrate (LOPRESSOR) 25 MG tablet TAKE 1 TABLET BY MOUTH TWICE A DAY 03/22/20   Nahser, Wonda Cheng, MD  rosuvastatin (CRESTOR) 5 MG tablet Take 1 tablet (5 mg total) by mouth daily. 11/20/19   Nahser, Wonda Cheng, MD  zolpidem (AMBIEN) 10 MG tablet Take 1 tablet (10 mg total) by mouth at bedtime as needed for  sleep. 01/31/18   Angiulli, Lavon Paganini, PA-C    Allergies    Patient has no known allergies.  Review of Systems   Review of Systems  Gastrointestinal: Positive for constipation.  All other systems reviewed and are negative.   Physical Exam Updated Vital Signs BP 125/63   Pulse 66   Temp 97.8 F (36.6 C) (Oral)   Resp 17   Ht 5\' 10"  (1.778 m)   Wt 78 kg   SpO2 98%   BMI 24.67 kg/m   Physical Exam Vitals and nursing note reviewed.  Constitutional:      Appearance: He is well-developed.  HENT:     Head: Normocephalic and atraumatic.     Nose: Nose normal. No congestion or rhinorrhea.     Mouth/Throat:     Mouth: Mucous membranes are moist.     Pharynx: Oropharynx is clear.  Eyes:     Pupils: Pupils are equal, round, and reactive to light.  Cardiovascular:     Rate and Rhythm: Normal rate.  Pulmonary:     Effort: Pulmonary effort is normal. No respiratory distress.  Abdominal:     General: Abdomen is flat. There is no distension.     Comments: Well healed mid line scar for laporotomy  Genitourinary:    Rectum: Normal.  Musculoskeletal:        General: No swelling or tenderness. Normal range of motion.     Cervical back: Normal range of motion.  Skin:    General: Skin is warm and dry.  Neurological:     General: No focal deficit present.     Mental Status: He is alert.  Psychiatric:        Mood and Affect: Mood normal.     ED Results / Procedures / Treatments   Labs (all labs ordered are listed, but only abnormal results are displayed) Labs Reviewed  CBC WITH DIFFERENTIAL/PLATELET - Abnormal; Notable for the following components:      Result Value    MCH 24.9 (*)    RDW 17.5 (*)    All other components within normal limits  COMPREHENSIVE METABOLIC PANEL  URINALYSIS, ROUTINE W REFLEX MICROSCOPIC    EKG None  Radiology No results found.  Procedures Procedures   Medications Ordered in ED Medications - No data to display  ED Course  I have reviewed the triage vital signs and the nursing notes.  Pertinent labs & imaging results that were available during my care of the patient were reviewed by me and considered in my medical decision making (see chart for details).    MDM Rules/Calculators/A&P                         Poor historian with constipation.  No fecal impaction.  Patient questionable whether or not he has had any flatulence but has a huge midline scar on exam so we will go and CT scan which he does have an obstruction.  If this is negative will discharge with instructions on how to relieve his the patient at home CT reassuring. No bowel obstruction. Gave instructions and Rx on how to deal with constipation.   Final Clinical Impression(s) / ED Diagnoses Final diagnoses:  None    Rx / DC Orders ED Discharge Orders    None       Jalaina Salyers, Corene Cornea, MD 07/23/20 [phone]

## 2020-07-22 NOTE — ED Notes (Signed)
Caretaker called and stated someone would be coming to pick up the patient , Christian Maxwell will be coming to pick him up

## 2020-07-22 NOTE — ED Triage Notes (Addendum)
Pt c/o constipation x4 weeks. Pt has a home health aide who advised pt to stay home and continue with Miralax and that they would take him to provider in a.m. Pt stated that he could not wait. Pt has hx of dementia. Information provided to this nurse by EMS.

## 2020-07-22 NOTE — Discharge Instructions (Signed)

## 2020-07-28 ENCOUNTER — Telehealth (HOSPITAL_COMMUNITY): Payer: Self-pay | Admitting: Physician Assistant

## 2020-07-28 NOTE — Telephone Encounter (Signed)
Just an FYI. We have made several attempts to contact this patient including sending a letter to schedule or reschedule their echocardiogram. We will be removing the patient from the echo WQ.   07/14/20 NO SHOW -MAILED LETTER [facility]    Thank you 

## 2020-07-29 NOTE — Telephone Encounter (Signed)
I called the patient.  He notes his chest symptoms are resolved since starting on the Colchicine after his visit with me. Ok to hold off on getting the echocardiogram for now. Keep appt with Dr. Acie Fredrickson 3/28  Will send to Dr. Acie Fredrickson as Juluis Rainier. Richardson Dopp, PA-C    07/29/2020 10:17 AM

## 2020-07-31 ENCOUNTER — Other Ambulatory Visit: Payer: Self-pay

## 2020-07-31 ENCOUNTER — Emergency Department (HOSPITAL_COMMUNITY)
Admission: EM | Admit: 2020-07-31 | Discharge: 2020-07-31 | Disposition: A | Discharge status: Home / Self Care | Payer: Medicare Other | Attending: Emergency Medicine | Admitting: Emergency Medicine

## 2020-07-31 ENCOUNTER — Encounter (HOSPITAL_COMMUNITY): Payer: Self-pay

## 2020-07-31 DIAGNOSIS — Z5321 Procedure and treatment not carried out due to patient leaving prior to being seen by health care provider: Secondary | ICD-10-CM | POA: Diagnosis not present

## 2020-07-31 DIAGNOSIS — M545 Low back pain, unspecified: Secondary | ICD-10-CM | POA: Insufficient documentation

## 2020-07-31 NOTE — ED Triage Notes (Signed)
Patient complains of ongoing lower back pain following fall 1 month ago. States that the pain is severe with any ambulation. Request injection for same. Alert and orirented

## 2020-07-31 NOTE — ED Notes (Signed)
Pt left without being seen.

## 2020-07-31 NOTE — ED Notes (Signed)
Pt stated they were leaving  

## 2020-08-03 DIAGNOSIS — M545 Low back pain, unspecified: Secondary | ICD-10-CM | POA: Diagnosis not present

## 2020-08-04 ENCOUNTER — Ambulatory Visit: Payer: Medicare Other | Admitting: Physician Assistant

## 2020-08-06 ENCOUNTER — Telehealth: Payer: Self-pay | Admitting: Physician Assistant

## 2020-08-06 NOTE — Telephone Encounter (Signed)
Please see my prior note  Richardson Dopp, PA-C 08/06/20 14:20

## 2020-08-06 NOTE — Telephone Encounter (Signed)
We need to make sure he is improved. I put him on Colchicine to treat pericarditis. He never got the echo I ordered. So it would be good to follow up with an exam to make sure he has recovered.  Richardson Dopp 08/06/2020 14:19

## 2020-08-06 NOTE — Telephone Encounter (Signed)
    Natalie calling, she wanted to ask Nicki Reaper if the appt with Dr. Acie Fredrickson is necessary and what is the f/u for. She said they are trying to resolve pt's back and trying to get an appt with ortho as of this time and will defitenly help if pt can just follow up in June.

## 2020-08-06 NOTE — Telephone Encounter (Signed)
Spoke with daughter and made her aware of information from [place].  Daughter states pt's only problem is his back issues.  Daughter can't come Monday but would like to be called while pt is here for visit.  Advised I will place note in appt so team is aware.  Daughter appreciative for call.

## 2020-08-09 ENCOUNTER — Encounter: Payer: Self-pay | Admitting: Cardiovascular Disease

## 2020-08-09 ENCOUNTER — Telehealth (HOSPITAL_COMMUNITY): Payer: Self-pay | Admitting: Physician Assistant

## 2020-08-09 ENCOUNTER — Ambulatory Visit (INDEPENDENT_AMBULATORY_CARE_PROVIDER_SITE_OTHER): Payer: Medicare Other | Admitting: Cardiovascular Disease

## 2020-08-09 ENCOUNTER — Telehealth: Payer: Self-pay | Admitting: Cardiovascular Disease

## 2020-08-09 ENCOUNTER — Other Ambulatory Visit: Payer: Self-pay

## 2020-08-09 VITALS — BP 116/72 | HR 74 | Ht 68.0 in | Wt 159.2 lb

## 2020-08-09 DIAGNOSIS — I482 Chronic atrial fibrillation, unspecified: Secondary | ICD-10-CM

## 2020-08-09 DIAGNOSIS — I2510 Atherosclerotic heart disease of native coronary artery without angina pectoris: Secondary | ICD-10-CM

## 2020-08-09 NOTE — Telephone Encounter (Signed)
I tried to contact [name] the patients daughter by the only number listed in patients chart to try to reschedule but number not in service at this time. I will reach out to [facility] at PCP office to update any other contacts in order to reschedule patient.  It appears this patient appointment was made while patient was in the office. Thank you.

## 2020-08-09 NOTE — Telephone Encounter (Signed)
Just an FYI. We have made several attempts to contact this patient including sending a letter to schedule or reschedule their echocardiogram. We will be removing the patient from the echo [place].   07/14/20 NO SHOW -MAILED LETTER [facility]    Thank you

## 2020-08-09 NOTE — Patient Instructions (Signed)
Medication Instructions:  Your physician has recommended you make the following change in your medication:   STOP aspirin *If you need a refill on your cardiac medications before your next appointment, please call your pharmacy*   Lab Work: none If you have labs (blood work) drawn today and your tests are completely normal, you will receive your results only by: [name] MyChart Message (if you have MyChart) OR . A paper copy in the mail If you have any lab test that is abnormal or we need to change your treatment, we will call you to review the results.   Testing/Procedures: none   Follow-Up: At [facility], you and your health needs are our priority.  As part of our continuing mission to provide you with exceptional heart care, we have created designated Provider Care Teams.  These Care Teams include your primary Cardiologist (physician) and Advanced Practice Providers (APPs -  Physician Assistants and Nurse Practitioners) who all work together to provide you with the care you need, when you need it.  We recommend signing up for the patient portal called "MyChart".  Sign up information is provided on this After Visit Summary.  MyChart is used to connect with patients for Virtual Visits (Telemedicine).  Patients are able to view lab/test results, encounter notes, upcoming appointments, etc.  Non-urgent messages can be sent to your provider as well.   To learn more about what you can do with MyChart, go to [email or web address].    Your next appointment:   6 month(s)  The format for your next appointment:   In Person  Provider:   Richardson Dopp, PA-C

## 2020-08-09 NOTE — Progress Notes (Signed)
Cardiology Office Note   Date:  08/09/2020   ID:  Christian Maxwell, DOB 06/18/1932, MRN [id number]  PCP:  Ria Bush, MD  Cardiologist:  Mertie Moores, MD   Chief Complaint  Patient presents with  . Atrial Fibrillation   1 . Atrial fibrillation 2. Hyperlipidemia 3. Premature ventricular contractions 4. Hypothyroidism   Christian Maxwell is a 87 year old gentleman with a history of atrial fibrillation, hyperlipidemia, and a history of PVCs. He exercises on a regular basis. He has not had any episodes of chest pain or shortness of breath. He denies any syncope or presyncope.  He's doing much better. He was having some shortness of breath earlier in the year and the end of last year.  Dec. 4, 2013 :  He presents today with some right sided chest wall pain. The discomfort increases with deep breath, right arm movement and with twisting of his torso. No dyspnea. No angina.   May 22, 2011: He's doing very well at this point. He is no longer having any episodes of chest wall pain. He still rides a stationary bike for 30 minutes twice a day and does not have any episodes of pain.  Feb. 5, 2015:  Christian Maxwell has no complaints. He was seen with his wife. It is clear that he is gradually getting more and more demented. His wife states that he breathes heavily when climbing stairs. He's able to do all of his normal activities without any significant difficulties. He does notice that he'll typically develop some chest pain that she's late taking his second metoprolol dose of the day.   Sep 30, 2013:  Christian Maxwell is doing well. Stays busy.   Oct. 5, 2015:   Christian Maxwell continues to have DOE. His wife says that he has lots of dyspnea - he denies it.    Sep 28, 2014:  Christian Maxwell is a 87 y.o. male who presents for atrial fib .  He has had more dyspnea and some chest pain  - especially with exertion.  Has difficulty describing his chest pain .   We have suggested a stress test  in the past.  He now agrees to do the test.   Feb.   10 , 2017: Doing well. No complaints.  Saw Christian Maxwell last month. Echo showed normal LV function.   Has marked LAE.   Trace MR, moderate TR .   Oct. 5 2017:  Christian Maxwell is doing well. He's not having any episodes of chest pain or shortness of breath. He has been taking his medications.  04/14/2016:   Christian Maxwell is seen today for follow-up of his chronic atrial fibrillation and chronic episodes of chest discomfort.  He was seen in the emergency room on November 20 with episode of chest pain. He had a [place] study earlier this week which was normal. He has no ischemia.  EF is 60%.  Chest pain  is feeling better.  Oct 03, 2016:  Christian Maxwell has had a rough several months His wife, Christian Maxwell, died of a CVA.  His brother and sister both died recent.   Doing Maxwell from a cardiac standpoint.  No CP   November 14, 2019:  Christian Maxwell is  seen today for follow-up of his chronic atrial fibrillation. Lives at home,  Has home health . , no cp , no dyspnea  Does not need a cane or walker.    August 09, 2020: Christian Maxwell is seen today for follow up of his chronic atrial fib. Daughter Christian Maxwell  (  [phone]) would like a phone call during visit  He was seen in the emergency room in February, 2022.  CTA of the chest revealed no evidence of pulmonary embolism.  He has coronary artery calcifications.  He was seen by Christian Dopp, PA on July 09, 2020.  At that visit he was complaining of constant chest pain.  The pain seemed to worsen as he changed positions and took a deep breath. Christian Maxwell thought that he was probably having chest wall pain he was started on ibuprofen for several days. He is not having any chest pain currently    Past Medical History:  Diagnosis Date  . Abscess 7/13   abd abscess  . Cancer [facility])    colon cancer  . Chronic atrial fibrillation ([place])   . History of colon cancer 2000   T3N0 s/p colectomy  . History of echocardiogram    a. Echo  2/13:  Mild LVH, EF 55-60%, mild MR, moderate LAE, mild to moderate RAE;  b. Echo 1/17: mild LVH, vigorous LVF, EF 65-70%, no RWMA, trivial AI, trivial MR, severe LAE, mild RAE, mod TR, PASP 32 mmHg  . History of stress test    a. Cardiolite in 9/04: EF 67%, no ischemia.;  b. Myoview 6/16: no ischemia   . Hx of adenomatous colonic polyps   . Hyperlipidemia   . Hypertension   . METHICILLIN RESISTANT STAPHYLOCOCCUS AUREUS INFECTION 12/12/2006   Annotation: stitch abcess in lower abdomen after colon cancer resection Qualifier: Diagnosis of  By: Christian Sima MD, Christian Maxwell    . Myocardial infarction ([place]) 1985  . Postoperative stitch abscess 07/19/2011  . PVC's (premature ventricular contractions)   . Syncope and collapse    pt denies  . Thyroid disease    followed by Dr. Forde Maxwell    Past Surgical History:  Procedure Laterality Date  . COLECTOMY  2000   6 inches removed  . COLONOSCOPY  2009   polyp, h/o cancer Christian Maxwell)  . EYE SURGERY     cataract surgery both eyes- pts ates he has never had cataract surgery 10-26-15  . HERNIA REPAIR  0/9811   DB umbilical hernia  . INCISE AND DRAIN ABCESS  2009,2010,2011,2012  . INCISION AND DRAINAGE Right 04/02/2013   Procedure: INCISION AND DRAINAGE RIGHT KNEE HEMATOMA;  Surgeon: Christian Rossetti, MD;  Location: [facility];  Service: Orthopedics;  Laterality: Right;  . INCISION AND DRAINAGE ABSCESS N/A 09/20/2012   Procedure: INCISION AND DRAINAGE SUPRAPUBIC ABSCESS;  Surgeon: Christian Jarred, MD;  Location: [facility];  Service: General;  Laterality: N/A;  . INCISION AND DRAINAGE ABSCESS Left 02/28/2013   Procedure: INCISION AND DRAINAGE LEFT ARM ABSCESS;  Surgeon: Christian Ok, MD;  Location: [facility];  Service: General;  Laterality: Left;  . LUMBAR LAMINECTOMY  1977  . MASS EXCISION  03/07/2012   EXCISION MASS;  Surgeon: Christian Jarred, MD;  Laterality: Right;  removal mass posterior right arm  . POLYPECTOMY    . WOUND EXPLORATION  2006    For possible stitch abscess Dr Christian Maxwell     Current Outpatient Medications  Medication Sig Dispense Refill  . colchicine 0.6 MG tablet Take 1 tablet (0.6 mg total) by mouth daily. 30 tablet 2  . ELIQUIS 5 MG TABS tablet TAKE 1 TABLET BY MOUTH TWICE A DAY 60 tablet 6  . hydroxypropyl methylcellulose / hypromellose (ISOPTO TEARS / GONIOVISC) 2.5 % ophthalmic solution Place 1 drop into both eyes in the morning and at bedtime.    [name]  ibuprofen (ADVIL) 400 MG tablet Take 1 tablet (400 mg total) by mouth 2 (two) times daily. Take for 3 days only then stop. 30 tablet 0  . metoprolol tartrate (LOPRESSOR) 25 MG tablet TAKE 1 TABLET BY MOUTH TWICE A DAY 180 tablet 2  . polyethylene glycol (MIRALAX / GLYCOLAX) 17 g packet Take 17 g by mouth daily. 14 each 0  . rosuvastatin (CRESTOR) 5 MG tablet Take 1 tablet (5 mg total) by mouth daily. 90 tablet 3  . ULTRAM 50 MG tablet Take 50 mg by mouth as directed.    . zolpidem (AMBIEN) 10 MG tablet Take 1 tablet (10 mg total) by mouth at bedtime as needed for sleep. 20 tablet 0   No current facility-administered medications for this visit.    Allergies:   Patient has no known allergies.    Social History:  The patient  reports that he quit smoking about 42 years ago. He quit smokeless tobacco use about 42 years ago.  His smokeless tobacco use included chew. He reports that he does not drink alcohol and does not use drugs.   Family History:  The patient's family history includes CAD (age of onset: 80) in his brother; CAD (age of onset: 82) in his father; Colon cancer in his cousin; Dementia in his brother and father; Dementia (age of onset: 13) in his sister; Diabetes in his brother; Hypertension in his father; Stroke in his brother, brother, father, and mother; Stroke (age of onset: 41) in his sister.    ROS:  Please see the history of present illness.     Physical Exam: Blood pressure 116/72, pulse 74, height 5\' 8"  (1.727 m), weight 159 lb 3.2 oz (72.2 kg),  SpO2 98 %.  GEN:  Elderly male,   HEENT: Normal NECK: No JVD; No carotid bruits LYMPHATICS: No lymphadenopathy CARDIAC:   Irreg. Irreg.  RESPIRATORY:  Clear to auscultation without rales, wheezing or rhonchi  ABDOMEN: Soft, non-tender, non-distended MUSCULOSKELETAL:  No edema; No deformity  SKIN: Warm and dry NEUROLOGIC:  Alert and oriented x 3    EKG:     Recent Labs: 07/09/2020: NT-Pro BNP 747 07/22/2020: ALT 22; BUN 19; Creatinine, Ser 1.01; Hemoglobin 14.4; Platelets 227; Potassium 3.4; Sodium 142    Lipid Panel    Component Value Date/Time   CHOL 118 02/20/2020 1100   TRIG 114 02/20/2020 1100   HDL 40 02/20/2020 1100   CHOLHDL 3.0 02/20/2020 1100   CHOLHDL 4.8 01/22/2018 0642   VLDL 28 01/22/2018 0642   LDLCALC 57 02/20/2020 1100      Wt Readings from Last 3 Encounters:  08/09/20 159 lb 3.2 oz (72.2 kg)  07/22/20 171 lb 15.3 oz (78 kg)  07/09/20 172 lb (78 kg)      Other studies Reviewed: Additional studies/ records that were reviewed today include: . Review of the above records demonstrates:    ASSESSMENT AND PLAN:  1 . Atrial fibrillation- -  Chronic Afib .    Stable,   2. Hyperlipidemia - cont current meds.   Managed by Dr. Forde Maxwell   3. Premature ventricular contractions  4. Hypothyroidism -   Managed by Dr. Forde Maxwell    Current medicines are reviewed at length with the patient today.  The patient does not have concerns regarding medicines.  The following changes have been made:  no change  Labs/ tests ordered today include:  No orders of the defined types were placed in this encounter.    Disposition:  FU with Christian Maxwell in 6 months      Mertie Moores, MD  08/09/2020 12:26 PM    [address], [place], [place]  [address] Phone: [phone]; Fax: [phone]

## 2020-08-09 NOTE — Telephone Encounter (Signed)
Christian Maxwell is calling from [facility] in regards to the recent Echo appointment Christian Maxwell missed. She states she has been speaking with his daughter Christian Maxwell who states she wasn't aware this Echo was needed or scheduled. She is requesting Christian Maxwell be contacted to discuss this and possibly reschedule due to believing Christian Maxwell is having some mind issues.  She states when you try to contact Christian Maxwell at his home he will hang up due to being unable to hear you. Christian Maxwell is scheduled to come in today to be seen, Christian Maxwell states she is going to try and reach out to his transportation services to have them get information in regards to this at his appointment today as well. Christian Maxwell states if you would like to contact her with questions request they overhead page her so you do not go to her VM. Please advise.

## 2020-08-11 DIAGNOSIS — E785 Hyperlipidemia, unspecified: Secondary | ICD-10-CM | POA: Diagnosis not present

## 2020-08-11 DIAGNOSIS — M545 Low back pain, unspecified: Secondary | ICD-10-CM | POA: Diagnosis not present

## 2020-08-11 DIAGNOSIS — R2681 Unsteadiness on feet: Secondary | ICD-10-CM | POA: Diagnosis not present

## 2020-08-11 DIAGNOSIS — F0280 Dementia in other diseases classified elsewhere without behavioral disturbance: Secondary | ICD-10-CM | POA: Diagnosis not present

## 2020-08-11 DIAGNOSIS — I480 Paroxysmal atrial fibrillation: Secondary | ICD-10-CM | POA: Diagnosis not present

## 2020-08-17 DIAGNOSIS — H40033 Anatomical narrow angle, bilateral: Secondary | ICD-10-CM | POA: Diagnosis not present

## 2020-08-17 DIAGNOSIS — H43393 Other vitreous opacities, bilateral: Secondary | ICD-10-CM | POA: Diagnosis not present

## 2020-08-27 ENCOUNTER — Inpatient Hospital Stay (HOSPITAL_COMMUNITY): Payer: Medicare Other

## 2020-08-27 ENCOUNTER — Other Ambulatory Visit: Payer: Self-pay

## 2020-08-27 ENCOUNTER — Encounter (HOSPITAL_COMMUNITY): Payer: Self-pay | Admitting: Family Medicine

## 2020-08-27 ENCOUNTER — Inpatient Hospital Stay (HOSPITAL_COMMUNITY)
Admission: EM | Admit: 2020-08-27 | Discharge: 2020-08-30 | DRG: 281 | Disposition: A | Discharge status: Skilled Nursing Facility | Payer: Medicare Other | Attending: Internal Medicine | Admitting: Internal Medicine

## 2020-08-27 DIAGNOSIS — Z87891 Personal history of nicotine dependence: Secondary | ICD-10-CM

## 2020-08-27 DIAGNOSIS — I96 Gangrene, not elsewhere classified: Secondary | ICD-10-CM | POA: Diagnosis not present

## 2020-08-27 DIAGNOSIS — Z7901 Long term (current) use of anticoagulants: Secondary | ICD-10-CM

## 2020-08-27 DIAGNOSIS — I447 Left bundle-branch block, unspecified: Secondary | ICD-10-CM | POA: Diagnosis present

## 2020-08-27 DIAGNOSIS — Z515 Encounter for palliative care: Secondary | ICD-10-CM | POA: Diagnosis not present

## 2020-08-27 DIAGNOSIS — Z8673 Personal history of transient ischemic attack (TIA), and cerebral infarction without residual deficits: Secondary | ICD-10-CM | POA: Diagnosis not present

## 2020-08-27 DIAGNOSIS — Z66 Do not resuscitate: Secondary | ICD-10-CM | POA: Diagnosis not present

## 2020-08-27 DIAGNOSIS — E785 Hyperlipidemia, unspecified: Secondary | ICD-10-CM | POA: Diagnosis not present

## 2020-08-27 DIAGNOSIS — I771 Stricture of artery: Principal | ICD-10-CM | POA: Diagnosis present

## 2020-08-27 DIAGNOSIS — I10 Essential (primary) hypertension: Secondary | ICD-10-CM | POA: Diagnosis present

## 2020-08-27 DIAGNOSIS — Z8249 Family history of ischemic heart disease and other diseases of the circulatory system: Secondary | ICD-10-CM | POA: Diagnosis not present

## 2020-08-27 DIAGNOSIS — I2510 Atherosclerotic heart disease of native coronary artery without angina pectoris: Secondary | ICD-10-CM | POA: Diagnosis not present

## 2020-08-27 DIAGNOSIS — Z79899 Other long term (current) drug therapy: Secondary | ICD-10-CM | POA: Diagnosis not present

## 2020-08-27 DIAGNOSIS — I214 Non-ST elevation (NSTEMI) myocardial infarction: Secondary | ICD-10-CM | POA: Diagnosis present

## 2020-08-27 DIAGNOSIS — Z8614 Personal history of Methicillin resistant Staphylococcus aureus infection: Secondary | ICD-10-CM | POA: Diagnosis not present

## 2020-08-27 DIAGNOSIS — R0902 Hypoxemia: Secondary | ICD-10-CM | POA: Diagnosis not present

## 2020-08-27 DIAGNOSIS — Z86010 Personal history of colonic polyps: Secondary | ICD-10-CM | POA: Diagnosis not present

## 2020-08-27 DIAGNOSIS — R0689 Other abnormalities of breathing: Secondary | ICD-10-CM | POA: Diagnosis not present

## 2020-08-27 DIAGNOSIS — I70222 Atherosclerosis of native arteries of extremities with rest pain, left leg: Secondary | ICD-10-CM | POA: Diagnosis not present

## 2020-08-27 DIAGNOSIS — Z823 Family history of stroke: Secondary | ICD-10-CM | POA: Diagnosis not present

## 2020-08-27 DIAGNOSIS — M4854XA Collapsed vertebra, not elsewhere classified, thoracic region, initial encounter for fracture: Secondary | ICD-10-CM | POA: Diagnosis not present

## 2020-08-27 DIAGNOSIS — I482 Chronic atrial fibrillation, unspecified: Secondary | ICD-10-CM | POA: Diagnosis not present

## 2020-08-27 DIAGNOSIS — I351 Nonrheumatic aortic (valve) insufficiency: Secondary | ICD-10-CM | POA: Diagnosis not present

## 2020-08-27 DIAGNOSIS — I7090 Unspecified atherosclerosis: Secondary | ICD-10-CM | POA: Diagnosis not present

## 2020-08-27 DIAGNOSIS — I4891 Unspecified atrial fibrillation: Secondary | ICD-10-CM | POA: Diagnosis present

## 2020-08-27 DIAGNOSIS — Z9049 Acquired absence of other specified parts of digestive tract: Secondary | ICD-10-CM | POA: Diagnosis not present

## 2020-08-27 DIAGNOSIS — I700 Atherosclerosis of aorta: Secondary | ICD-10-CM | POA: Diagnosis present

## 2020-08-27 DIAGNOSIS — Z833 Family history of diabetes mellitus: Secondary | ICD-10-CM

## 2020-08-27 DIAGNOSIS — I252 Old myocardial infarction: Secondary | ICD-10-CM | POA: Diagnosis not present

## 2020-08-27 DIAGNOSIS — Z85038 Personal history of other malignant neoplasm of large intestine: Secondary | ICD-10-CM | POA: Diagnosis not present

## 2020-08-27 DIAGNOSIS — I493 Ventricular premature depolarization: Secondary | ICD-10-CM | POA: Diagnosis present

## 2020-08-27 DIAGNOSIS — M79605 Pain in left leg: Secondary | ICD-10-CM | POA: Diagnosis not present

## 2020-08-27 DIAGNOSIS — I70202 Unspecified atherosclerosis of native arteries of extremities, left leg: Secondary | ICD-10-CM | POA: Diagnosis not present

## 2020-08-27 DIAGNOSIS — F0390 Unspecified dementia without behavioral disturbance: Secondary | ICD-10-CM | POA: Diagnosis present

## 2020-08-27 DIAGNOSIS — I361 Nonrheumatic tricuspid (valve) insufficiency: Secondary | ICD-10-CM | POA: Diagnosis not present

## 2020-08-27 DIAGNOSIS — I70209 Unspecified atherosclerosis of native arteries of extremities, unspecified extremity: Secondary | ICD-10-CM | POA: Diagnosis not present

## 2020-08-27 DIAGNOSIS — Z5320 Procedure and treatment not carried out because of patient's decision for unspecified reasons: Secondary | ICD-10-CM | POA: Diagnosis present

## 2020-08-27 DIAGNOSIS — J9811 Atelectasis: Secondary | ICD-10-CM | POA: Diagnosis not present

## 2020-08-27 DIAGNOSIS — I998 Other disorder of circulatory system: Secondary | ICD-10-CM | POA: Diagnosis not present

## 2020-08-27 DIAGNOSIS — F419 Anxiety disorder, unspecified: Secondary | ICD-10-CM | POA: Diagnosis present

## 2020-08-27 DIAGNOSIS — I77810 Thoracic aortic ectasia: Secondary | ICD-10-CM | POA: Diagnosis present

## 2020-08-27 DIAGNOSIS — Z800 Family history of malignant neoplasm of digestive organs: Secondary | ICD-10-CM | POA: Diagnosis not present

## 2020-08-27 DIAGNOSIS — Z7189 Other specified counseling: Secondary | ICD-10-CM | POA: Diagnosis not present

## 2020-08-27 DIAGNOSIS — E782 Mixed hyperlipidemia: Secondary | ICD-10-CM | POA: Diagnosis not present

## 2020-08-27 DIAGNOSIS — Z743 Need for continuous supervision: Secondary | ICD-10-CM | POA: Diagnosis not present

## 2020-08-27 DIAGNOSIS — Z20822 Contact with and (suspected) exposure to covid-19: Secondary | ICD-10-CM | POA: Diagnosis not present

## 2020-08-27 DIAGNOSIS — R279 Unspecified lack of coordination: Secondary | ICD-10-CM | POA: Diagnosis not present

## 2020-08-27 LAB — BASIC METABOLIC PANEL
Anion gap: 6 (ref 5–15)
BUN: 34 mg/dL — ABNORMAL HIGH (ref 8–23)
CO2: 28 mmol/L (ref 22–32)
Calcium: 10 mg/dL (ref 8.9–10.3)
Chloride: 108 mmol/L (ref 98–111)
Creatinine, Ser: 0.92 mg/dL (ref 0.61–1.24)
GFR, Estimated: >60 mL/min (ref >60)
Glucose, Bld: 99 mg/dL (ref 70–99)
Potassium: 3.5 mmol/L (ref 3.5–5.1)
Sodium: 142 mmol/L (ref 135–145)

## 2020-08-27 LAB — ECHOCARDIOGRAM COMPLETE
AR max vel: 2.41 cm2
AV Area VTI: 2.46 cm2
AV Area mean vel: 2.58 cm2
AV Mean grad: 2 mmHg
AV Peak grad: 3.6 mmHg
Ao pk vel: 0.95 m/s
Area-P 1/2: 3.77 cm2
Calc EF: 36.1 %
Height: 68 in
P 1/2 time: 636 msec
S' Lateral: 2.8 cm
Single Plane A2C EF: 38.4 %
Single Plane A4C EF: 33.1 %
Weight: 2400 oz

## 2020-08-27 LAB — RESP PANEL BY RT-PCR (FLU A&B, COVID) ARPGX2
Influenza A by PCR: NEGATIVE
Influenza B by PCR: NEGATIVE
SARS Coronavirus 2 by RT PCR: NEGATIVE

## 2020-08-27 LAB — CBC WITH DIFFERENTIAL/PLATELET
Abs Immature Granulocytes: 0.02 10*3/uL (ref 0.00–0.07)
Basophils Absolute: 0 10*3/uL (ref 0.0–0.1)
Basophils Relative: 0 %
Eosinophils Absolute: 0 10*3/uL (ref 0.0–0.5)
Eosinophils Relative: 0 %
HCT: 49.4 % (ref 39.0–52.0)
Hemoglobin: 15.6 g/dL (ref 13.0–17.0)
Immature Granulocytes: 0 %
Lymphocytes Relative: 10 %
Lymphs Abs: 1 10*3/uL (ref 0.7–4.0)
MCH: 26.4 pg (ref 26.0–34.0)
MCHC: 31.6 g/dL (ref 30.0–36.0)
MCV: 83.7 fL (ref 80.0–100.0)
Monocytes Absolute: 1 10*3/uL (ref 0.1–1.0)
Monocytes Relative: 10 %
Neutro Abs: 7.8 10*3/uL — ABNORMAL HIGH (ref 1.7–7.7)
Neutrophils Relative %: 80 %
Platelets: 166 10*3/uL (ref 150–400)
RBC: 5.9 MIL/uL — ABNORMAL HIGH (ref 4.22–5.81)
RDW: 20.8 % — ABNORMAL HIGH (ref 11.5–15.5)
WBC: 9.9 10*3/uL (ref 4.0–10.5)
nRBC: 0 % (ref 0.0–0.2)

## 2020-08-27 LAB — PROTIME-INR
INR: 1.4 — ABNORMAL HIGH (ref 0.8–1.2)
Prothrombin Time: 17.1 seconds — ABNORMAL HIGH (ref 11.4–15.2)

## 2020-08-27 LAB — TYPE AND SCREEN
ABO/RH(D): A POS
Antibody Screen: NEGATIVE

## 2020-08-27 LAB — HEPARIN LEVEL (UNFRACTIONATED): Heparin Unfractionated: 1.03 IU/mL — ABNORMAL HIGH (ref 0.30–0.70)

## 2020-08-27 LAB — APTT: aPTT: 43 seconds — ABNORMAL HIGH (ref 24–36)

## 2020-08-27 MED ORDER — MORPHINE SULFATE (PF) 2 MG/ML IV SOLN
2.0000 mg | INTRAVENOUS | Status: DC | PRN
  Administered 2020-08-27: 2 mg via INTRAVENOUS
  Filled 2020-08-27: qty 1

## 2020-08-27 MED ORDER — ONDANSETRON HCL 4 MG PO TABS: 4.0000 mg | ORAL_TABLET | Freq: Four times a day (QID) | ORAL | Status: DC | PRN

## 2020-08-27 MED ORDER — OXYCODONE HCL 5 MG PO TABS
5.0000 mg | ORAL_TABLET | Freq: Four times a day (QID) | ORAL | Status: DC | PRN
  Administered 2020-08-28 (×2): 5 mg via ORAL
  Filled 2020-08-27 (×2): qty 1

## 2020-08-27 MED ORDER — IOHEXOL 350 MG/ML SOLN
100.0000 mL | Freq: Once | INTRAVENOUS | Status: AC | PRN
  Administered 2020-08-27: 100 mL via INTRAVENOUS

## 2020-08-27 MED ORDER — ACETAMINOPHEN 325 MG PO TABS: 650.0000 mg | ORAL_TABLET | Freq: Four times a day (QID) | ORAL | Status: DC | PRN

## 2020-08-27 MED ORDER — ACETAMINOPHEN 650 MG RE SUPP: 650.0000 mg | Freq: Four times a day (QID) | RECTAL | Status: DC | PRN

## 2020-08-27 MED ORDER — ZOLPIDEM TARTRATE 5 MG PO TABS
5.0000 mg | ORAL_TABLET | Freq: Every evening | ORAL | Status: DC | PRN
  Administered 2020-08-27 – 2020-08-29 (×3): 5 mg via ORAL
  Filled 2020-08-27 (×3): qty 1

## 2020-08-27 MED ORDER — LACTATED RINGERS IV SOLN: INTRAVENOUS | Status: DC

## 2020-08-27 MED ORDER — ROSUVASTATIN CALCIUM 5 MG PO TABS
5.0000 mg | ORAL_TABLET | Freq: Every day | ORAL | Status: DC
  Administered 2020-08-27 – 2020-08-28 (×2): 5 mg via ORAL
  Filled 2020-08-27 (×2): qty 1

## 2020-08-27 MED ORDER — HEPARIN (PORCINE) 25000 UT/250ML-% IV SOLN
1350.0000 [IU]/h | INTRAVENOUS | Status: DC
  Administered 2020-08-27: 1000 [IU]/h via INTRAVENOUS
  Administered 2020-08-28: 1350 [IU]/h via INTRAVENOUS
  Filled 2020-08-27 (×2): qty 250

## 2020-08-27 MED ORDER — POLYETHYLENE GLYCOL 3350 17 G PO PACK: 17.0000 g | PACK | Freq: Every day | ORAL | Status: DC | PRN

## 2020-08-27 MED ORDER — ONDANSETRON HCL 4 MG/2ML IJ SOLN: 4.0000 mg | Freq: Four times a day (QID) | INTRAMUSCULAR | Status: DC | PRN

## 2020-08-27 MED ORDER — METOPROLOL TARTRATE 25 MG PO TABS
25.0000 mg | ORAL_TABLET | Freq: Two times a day (BID) | ORAL | Status: DC
  Administered 2020-08-27 (×2): 25 mg via ORAL
  Filled 2020-08-27 (×2): qty 1

## 2020-08-27 NOTE — Progress Notes (Signed)
Pt arrived from ED, VSS, CHG completed, Call light within reach, daughter arrived from [employer].Patient is AOOx4.    Chrisandra Carota, RN 08/27/2020

## 2020-08-27 NOTE — Progress Notes (Signed)
I had a relatively long conversation with his daughter Lynelle Smoke on the phone regarding the patient's refusal to undergo an above-the-knee amputation.  I recommended she get together with some love ones and have a conversation with him.  Continued contact with the vascular surgery team is important as well.  All questions answered. Christian Maxwell 5:10 PM

## 2020-08-27 NOTE — ED Notes (Signed)
Called and spoke with daughter Lynelle Smoke [phone] update given, states she lives in [place]  And she is coming.

## 2020-08-27 NOTE — Progress Notes (Signed)
   I evaluated patient in the emergency department.  I agree with previous assessment that there is no option for revascularization patient will be best served with left above-knee amputation.  He continues to refuse this procedure at this time.  We will need to hold Eliquis transition to heparin.  We will plan above-knee amputation when patient is amenable.  If ultimately he decides along with his daughters that he is unwilling to proceed then he would be best served with palliative care consultation.  Lorenza Winkleman C. Donzetta Matters, MD [facility] Office: [phone] Pager: [phone]

## 2020-08-27 NOTE — Progress Notes (Signed)
ANTICOAGULATION CONSULT NOTE - Follow Up Consult  Pharmacy Consult for Heparin Indication: arterial inclusion  No Known Allergies  Patient Measurements: Height: 5\' 8"  (172.7 cm) Weight: 68 kg (150 lb) IBW/kg (Calculated) : 68.4 Heparin Dosing Weight: 68 kg  Vital Signs: Temp: 97.6 F (36.4 C) (04/15 1527) Temp Source: Oral (04/15 1527) BP: 156/84 (04/15 1527) Pulse Rate: 72 (04/15 1527)  Labs: Recent Labs    08/27/20 0645 08/27/20 1653  HGB 15.6  --   HCT 49.4  --   PLT 166  --   APTT  --  43*  LABPROT 17.1*  --   INR 1.4*  --   HEPARINUNFRC  --  1.03*  CREATININE 0.92  --     Estimated Creatinine Clearance: 56.5 mL/min (by C-G formula based on SCr of 0.92 mg/dL).   Medical History: Past Medical History:  Diagnosis Date  . Abscess 7/13   abd abscess  . Cancer [facility])    colon cancer  . Chronic atrial fibrillation ([place])   . History of colon cancer 2000   T3N0 s/p colectomy  . History of echocardiogram    a. Echo 2/13:  Mild LVH, EF 55-60%, mild MR, moderate LAE, mild to moderate RAE;  b. Echo 1/17: mild LVH, vigorous LVF, EF 65-70%, no RWMA, trivial AI, trivial MR, severe LAE, mild RAE, mod TR, PASP 32 mmHg  . History of stress test    a. Cardiolite in 9/04: EF 67%, no ischemia.;  b. Myoview 6/16: no ischemia   . Hx of adenomatous colonic polyps   . Hyperlipidemia   . Hypertension   . METHICILLIN RESISTANT STAPHYLOCOCCUS AUREUS INFECTION 12/12/2006   Annotation: stitch abcess in lower abdomen after colon cancer resection Qualifier: Diagnosis of  By: [name] MD, [name]    . Myocardial infarction ([place]) 1985  . Postoperative stitch abscess 07/19/2011  . PVC's (premature ventricular contractions)   . Syncope and collapse    pt denies  . Thyroid disease    followed by [name]    Medications:  See electronic med rec  Assessment: [name] presents with L leg cold and mottled to touch. Pt on apixaban PTA for afib. Last dose 4/14 1700. Apixaban will be held  and heparin gtt started for arterial occlusion. Apixaban will likely be affecting heparin levels so will utilize PTT for monitoring until levels correlate.   Heparin level 1.03 - falsely elevated from previous apixaban use. aPTT 43 is subtherapeutic on heparin 1000 units/hr. No issues with IV infusion or access per RN. No noted bleeding.   Plan for above knee amputation when patient is amenable.   Goal of Therapy:  Heparin level 0.3-0.7 units/ml; PTT 66-102 sec Monitor platelets by anticoagulation protocol: Yes   Plan:  Increase heparin to 1200 units/hr Check 8 hr aPTT and heparin level Monitor aPTT, heparin level, CBC and s/s of bleeding daily  Follow up plans for above knee amputation and transition back to apixaban  Cristela Felt, PharmD Clinical Pharmacist  08/27/2020,6:54 PM

## 2020-08-27 NOTE — Progress Notes (Signed)
Echocardiogram completed.

## 2020-08-27 NOTE — ED Triage Notes (Signed)
Pt bib EMS from home due to left leg, foot and back pain. Per EMS, pts left leg is cold to touch and mottled. Per pt, Left leg has been numb and in pain for about a month now.  Hx of mild dementia. Pt A&O x4 on triage.  Vitals WDL

## 2020-08-27 NOTE — Consult Note (Signed)
[facility] [place]  ASSESSMENT / PLAN: 87 y.o. male with non-salvageable left lower extremity. I explained to the patient that we have no options for revascularization. A left above knee amputation is unfortunately his only option going forward. My partner, Dr. Donzetta Matters, may have time to do this later today. Please ask internal medicine to evaluate the patient for admission. He will need an embolic work up (echocardiogram, CTA chest). Hold Eliquis. Transition therapeutic anticoagulation to heparin. Keep NPO. Will discuss with daughter.   CHIEF COMPLAINT: left leg pain  HISTORY OF PRESENT ILLNESS: Christian Maxwell is a 87 y.o. male who presents to the emergency room for evaluation of left leg pain.  He states this is been going on for 2 weeks.  He cannot move or feel his left leg.  He is on Eliquis for A. fib, which he reports he has been taking consistently.  His last dose was last night.  He has never had pain like this before.  He has never had vascular surgery before.  Past Medical History:  Diagnosis Date  . Abscess 7/13   abd abscess  . Cancer [facility])    colon cancer  . Chronic atrial fibrillation ([place])   . History of colon cancer 2000   T3N0 s/p colectomy  . History of echocardiogram    a. Echo 2/13:  Mild LVH, EF 55-60%, mild MR, moderate LAE, mild to moderate RAE;  b. Echo 1/17: mild LVH, vigorous LVF, EF 65-70%, no RWMA, trivial AI, trivial MR, severe LAE, mild RAE, mod TR, PASP 32 mmHg  . History of stress test    a. Cardiolite in 9/04: EF 67%, no ischemia.;  b. Myoview 6/16: no ischemia   . Hx of adenomatous colonic polyps   . Hyperlipidemia   . Hypertension   . METHICILLIN RESISTANT STAPHYLOCOCCUS AUREUS INFECTION 12/12/2006   Annotation: stitch abcess in lower abdomen after colon cancer resection Qualifier: Diagnosis of  By: [name] MD, [name]    . Myocardial infarction ([place]) 1985  . Postoperative stitch abscess 07/19/2011  . PVC's (premature ventricular  contractions)   . Syncope and collapse    pt denies  . Thyroid disease    followed by [name]    Past Surgical History:  Procedure Laterality Date  . COLECTOMY  2000   6 inches removed  . COLONOSCOPY  2009   polyp, h/o cancer [name])  . EYE SURGERY     cataract surgery both eyes- pts ates he has never had cataract surgery 10-26-15  . HERNIA REPAIR  01/3809   DB umbilical hernia  . INCISE AND DRAIN ABCESS  2009,2010,2011,2012  . INCISION AND DRAINAGE Right 04/02/2013   Procedure: INCISION AND DRAINAGE RIGHT KNEE HEMATOMA;  Surgeon: [name], MD;  Location: [place];  Service: Orthopedics;  Laterality: Right;  . INCISION AND DRAINAGE ABSCESS N/A 09/20/2012   Procedure: INCISION AND DRAINAGE SUPRAPUBIC ABSCESS;  Surgeon: [name], MD;  Location: [place];  Service: General;  Laterality: N/A;  . INCISION AND DRAINAGE ABSCESS Left 02/28/2013   Procedure: INCISION AND DRAINAGE LEFT ARM ABSCESS;  Surgeon: [name], MD;  Location: [place];  Service: General;  Laterality: Left;  . LUMBAR LAMINECTOMY  1977  . MASS EXCISION  03/07/2012   EXCISION MASS;  Surgeon: [name], MD;  Laterality: Right;  removal mass posterior right arm  . POLYPECTOMY    . WOUND EXPLORATION  2006   For possible stitch abscess Dr  [name]    Family History  Problem Relation Age of Onset  . Stroke Mother   . Stroke Father   . Hypertension Father   . CAD Father 40       MI  . Dementia Father   . Stroke Sister 49  . Stroke Brother   . Stroke Brother   . Diabetes Brother   . CAD Brother 20       MI  . Dementia Sister 38  . Dementia Brother   . Colon cancer Cousin        x4 total with colon cancer  . Cancer Neg Hx   . Colon polyps Neg Hx   . Rectal cancer Neg Hx   . Stomach cancer Neg Hx     Social History   Socioeconomic History  . Marital status: Widowed    Spouse name: Not on file  . Number of children: Not on file  . Years of education: Not on  file  . Highest education level: Not on file  Occupational History  . Not on file  Tobacco Use  . Smoking status: Former Smoker    Quit date: 10/13/1977    Years since quitting: 42.9  . Smokeless tobacco: Former Systems developer    Types: [place] date: 10/13/1977  Vaping Use  . Vaping Use: Never used  Substance and Sexual Activity  . Alcohol use: No    Alcohol/week: 0.0 standard drinks  . Drug use: No  . Sexual activity: Never  Other Topics Concern  . Not on file  Social History Narrative   Caffeine: none   Lives alone.  Grown daughters x2   Occupation: retired, had Human resources officer   Activity: take care of 3 houses   Diet: fruits/vegetables daily, good water      Cards: Ecologist   Endo: [place]   Surg: [name]   GI: [name]   Social Determinants of Health   Financial Resource Strain: Not on file  Food Insecurity: Not on file  Transportation Needs: Not on file  Physical Activity: Not on file  Stress: Not on file  Social Connections: Not on file  Intimate Partner Violence: Not on file    No Known Allergies  Current Facility-Administered Medications  Medication Dose Route Frequency Provider Last Rate Last Admin  . heparin ADULT infusion 100 units/mL (25000 units/257mL)  1,000 Units/hr Intravenous Continuous Franky Macho, [facility]       Current Outpatient Medications  Medication Sig Dispense Refill  . colchicine 0.6 MG tablet Take 1 tablet (0.6 mg total) by mouth daily. 30 tablet 2  . ELIQUIS 5 MG TABS tablet TAKE 1 TABLET BY MOUTH TWICE A DAY (Patient taking differently: Take 5 mg by mouth 2 (two) times daily.) 60 tablet 6  . hydroxypropyl methylcellulose / hypromellose (ISOPTO TEARS / GONIOVISC) 2.5 % ophthalmic solution Place 1 drop into both eyes in the morning and at bedtime.    [name] ibuprofen (ADVIL) 200 MG tablet Take 200 mg by mouth every 6 (six) hours as needed for mild pain or headache.    . metoprolol tartrate (LOPRESSOR) 25 MG tablet TAKE 1 TABLET BY MOUTH TWICE A DAY  (Patient taking differently: Take 25 mg by mouth 2 (two) times daily.) 180 tablet 2  . polyethylene glycol (MIRALAX / GLYCOLAX) 17 g packet Take 17 g by mouth daily. (Patient taking differently: Take 17 g by mouth daily as needed for mild constipation.) 14 each 0  . rosuvastatin (CRESTOR) 5 MG  tablet Take 1 tablet (5 mg total) by mouth daily. 90 tablet 3  . ULTRAM 50 MG tablet Take 50 mg by mouth daily.    [name] zolpidem (AMBIEN) 10 MG tablet Take 1 tablet (10 mg total) by mouth at bedtime as needed for sleep. (Patient taking differently: Take 10 mg by mouth at bedtime.) 20 tablet 0  . ibuprofen (ADVIL) 400 MG tablet Take 1 tablet (400 mg total) by mouth 2 (two) times daily. Take for 3 days only then stop. (Patient not taking: No sig reported) 30 tablet 0    REVIEW OF SYSTEMS:  [X]  denotes positive finding, [ ]  denotes negative finding Cardiac  Comments:  Chest pain or chest pressure:    Shortness of breath upon exertion:    Short of breath when lying flat:    Irregular heart rhythm:        Vascular    Pain in calf, thigh, or hip brought on by ambulation:    Pain in feet at night that wakes you up from your sleep:     Blood clot in your veins:    Leg swelling:         Pulmonary    Oxygen at home:    Productive cough:     Wheezing:         Neurologic    Sudden weakness in arms or legs:     Sudden numbness in arms or legs:     Sudden onset of difficulty speaking or slurred speech:    Temporary loss of vision in one eye:     Problems with dizziness:         Gastrointestinal    Blood in stool:     Vomited blood:         Genitourinary    Burning when urinating:     Blood in urine:        Psychiatric    Major depression:         Hematologic    Bleeding problems:    Problems with blood clotting too easily:        Skin    Rashes or ulcers:        Constitutional    Fever or chills:      PHYSICAL EXAM  Vitals:   08/27/20 0548 08/27/20 0551  BP: 139/75   Pulse: 67    Resp: 14   Temp: (!) 97.5 F (36.4 C)   TempSrc: Oral   SpO2: 96%   Weight:  68 kg  Height:  5\' 8"  (1.727 m)    Constitutional: Elderly, chronically ill. no distress. Appears well nourished.  Neurologic: CN intact. Left leg with no motor or sensory function about the toes, foot, ankle, or knee. Sensation returns above the knee. He can flex his left hip. Normal motor / sensory function in his right leg. Psychiatric: Mood and affect symmetric and appropriate. Eyes: No icterus. No conjunctival pallor. Ears, nose, throat: mucous membranes moist. Midline trachea.  Cardiac: irregular rate and rhythm.  Respiratory: unlabored. Abdominal: soft, non-tender, non-distended.  Peripheral vascular:  2+ radial pulses  2+ femoral pulses  2+ R popliteal pulse  Peroneal only DS in R ankle  Absent L popliteal pulse  Absent doppler flow in L pedal vessels Extremity: No edema. No cyanosis. No pallor.  Skin: No gangrene. No ulceration.  Lymphatic: No Stemmer's sign. No palpable lymphadenopathy.      PERTINENT LABORATORY AND RADIOLOGIC DATA  Most recent CBC CBC Latest Ref Rng &  Units 07/22/2020 07/07/2020 07/07/2020  WBC 4.0 - 10.5 K/uL 6.7 10.0 8.7  Hemoglobin 13.0 - 17.0 g/dL 14.4 14.6 14.4  Hematocrit 39.0 - 52.0 % 47.8 47.2 48.2  Platelets 150 - 400 K/uL 227 222 218     Most recent CMP CMP Latest Ref Rng & Units 07/22/2020 07/07/2020 07/07/2020  Glucose 70 - 99 mg/dL 96 108(H) 103(H)  BUN 8 - 23 mg/dL 19 19 16   Creatinine 0.61 - 1.24 mg/dL 1.01 0.92 0.97  Sodium 135 - 145 mmol/L 142 138 138  Potassium 3.5 - 5.1 mmol/L 3.4(L) 3.9 3.9  Chloride 98 - 111 mmol/L 105 104 104  CO2 22 - 32 mmol/L 29 24 24   Calcium 8.9 - 10.3 mg/dL 9.8 10.0 10.1  Total Protein 6.5 - 8.1 g/dL 6.5 - -  Total Bilirubin 0.3 - 1.2 mg/dL 1.2 - -  Alkaline Phos 38 - 126 U/L 85 - -  AST 15 - 41 U/L 29 - -  ALT 0 - 44 U/L 22 - -    Renal function CrCl cannot be calculated (Patient's most recent lab result is older  than the maximum 21 days allowed.).  Hgb A1c MFr Bld (%)  Date Value  01/22/2018 5.9 (H)    LDL Chol Calc (NIH)  Date Value Ref Range Status  02/20/2020 57 0 - 99 mg/dL Final     Yevonne Aline. Stanford Breed, MD [facility] Phone Number: [phone] 08/27/2020 7:19 AM

## 2020-08-27 NOTE — Progress Notes (Signed)
ANTICOAGULATION CONSULT NOTE - Initial Consult  Pharmacy Consult for Heparin Indication: arterial inclusion  No Known Allergies  Patient Measurements: Height: 5\' 8"  (172.7 cm) Weight: 68 kg (150 lb) IBW/kg (Calculated) : 68.4 Heparin Dosing Weight: 68 kg  Vital Signs: Temp: 97.5 F (36.4 C) (04/15 0548) Temp Source: Oral (04/15 0548) BP: 139/75 (04/15 0548) Pulse Rate: 67 (04/15 0548)  Labs: No results for input(s): HGB, HCT, PLT, APTT, LABPROT, INR, HEPARINUNFRC, HEPRLOWMOCWT, CREATININE, CKTOTAL, CKMB, TROPONINIHS in the last 72 hours.  CrCl cannot be calculated (Patient's most recent lab result is older than the maximum 21 days allowed.).   Medical History: Past Medical History:  Diagnosis Date  . Abscess 7/13   abd abscess  . Cancer [facility])    colon cancer  . Chronic atrial fibrillation ([place])   . History of colon cancer 2000   T3N0 s/p colectomy  . History of echocardiogram    a. Echo 2/13:  Mild LVH, EF 55-60%, mild MR, moderate LAE, mild to moderate RAE;  b. Echo 1/17: mild LVH, vigorous LVF, EF 65-70%, no RWMA, trivial AI, trivial MR, severe LAE, mild RAE, mod TR, PASP 32 mmHg  . History of stress test    a. Cardiolite in 9/04: EF 67%, no ischemia.;  b. Myoview 6/16: no ischemia   . Hx of adenomatous colonic polyps   . Hyperlipidemia   . Hypertension   . METHICILLIN RESISTANT STAPHYLOCOCCUS AUREUS INFECTION 12/12/2006   Annotation: stitch abcess in lower abdomen after colon cancer resection Qualifier: Diagnosis of  By: Christian Sima MD, Christian Maxwell    . Myocardial infarction ([place]) 1985  . Postoperative stitch abscess 07/19/2011  . PVC's (premature ventricular contractions)   . Syncope and collapse    pt denies  . Thyroid disease    followed by [name]    Medications:  See electronic med rec  Assessment: [name] presents with L leg cold and mottled to touch. Pt on apixaban PTA for afib. Last dose 4/14 1700. Apixaban will be held and heparin gtt started for  arterial occlusion. Apixaban will likely be affecting heparin levels so will utilize PTT for monitoring until levels correlate.   Goal of Therapy:  Heparin level 0.3-0.7 units/ml; PTT 66-102 sec Monitor platelets by anticoagulation protocol: Yes   Plan:  Heparin gtt at 1000 units/hr. No bolus. Will f/u heparin level and PTT in 8 hours Daily heparin level, PTT, and CBC  Sherlon Handing, PharmD, BCPS Please see amion for complete clinical pharmacist phone list 08/27/2020,6:37 AM

## 2020-08-27 NOTE — ED Provider Notes (Signed)
[facility] EMERGENCY DEPARTMENT Provider Note   CSN: [id number] Arrival date & time: 08/27/20  0545     History Chief Complaint  Patient presents with  . Leg Pain    Christian Maxwell is a 87 y.o. male.  The history is provided by the patient.  Leg Pain Location:  Leg Leg location:  L lower leg Pain details:    Quality:  Aching   Severity:  Severe   Onset quality:  Gradual   Duration: Weeks.   Timing:  Constant   Progression:  Worsening Chronicity:  New Relieved by:  Nothing Exacerbated by: Movement and palpation. Associated symptoms: back pain ( Chronic back pain) and numbness   Associated symptoms: no fever   Associated symptoms comment:  Chronic back pain     Patient with extensive history including chronic atrial fibrillation, hypertension, hyperlipidemia presents with left leg pain.  Patient ports his left lower leg has been painful for the past month.  Over the past day he has noted is worsening and feels numb.  No recent falls or trauma. No chest pain or shortness of breath.  Patient thinks he is on anticoagulation Past Medical History:  Diagnosis Date  . Abscess 7/13   abd abscess  . Cancer [facility])    colon cancer  . Chronic atrial fibrillation ([place])   . History of colon cancer 2000   T3N0 s/p colectomy  . History of echocardiogram    a. Echo 2/13:  Mild LVH, EF 55-60%, mild MR, moderate LAE, mild to moderate RAE;  b. Echo 1/17: mild LVH, vigorous LVF, EF 65-70%, no RWMA, trivial AI, trivial MR, severe LAE, mild RAE, mod TR, PASP 32 mmHg  . History of stress test    a. Cardiolite in 9/04: EF 67%, no ischemia.;  b. Myoview 6/16: no ischemia   . Hx of adenomatous colonic polyps   . Hyperlipidemia   . Hypertension   . METHICILLIN RESISTANT STAPHYLOCOCCUS AUREUS INFECTION 12/12/2006   Annotation: stitch abcess in lower abdomen after colon cancer resection Qualifier: Diagnosis of  By: [name] MD, [name]    . Myocardial infarction ([place]) 1985   . Postoperative stitch abscess 07/19/2011  . PVC's (premature ventricular contractions)   . Syncope and collapse    pt denies  . Thyroid disease    followed by [name]    Patient Active Problem List   Diagnosis Date Noted  . Benign essential HTN   . Sleep disturbance   . SOB (shortness of breath)   . Transaminitis   . Prediabetes   . Thalamic hemorrhage ([place]) 01/23/2018  . Cerebral hemorrhage ([place])   . Essential hypertension   . Dyslipidemia   . Dementia without behavioral disturbance ([place]) 01/22/2018  . Ataxia 01/21/2018  . Bleeding in brain ([place]) 01/21/2018  . Hypoxia 01/21/2018  . Chest pain 01/20/2018  . Hearing impairment 11/11/2015  . Olecranon bursitis of left elbow 11/11/2015  . LBBB (left bundle branch block) 05/27/2015  . Senile purpura ([place]) 02/22/2015  . Wound, open, arm, forearm 02/22/2015  . Advanced care planning/counseling discussion 11/06/2014  . Medicare annual wellness visit, initial 11/06/2014  . Bilateral knee pain 03/17/2014  . DOE (dyspnea on exertion) 02/16/2014  . Memory impairment 03/06/2013  . MRSA colonization 12/12/2012  . Nevus of sternal region 12/04/2012  . Seasonal allergic rhinitis 10/22/2012  . Thyroid disease   . Suprapubic abscess 08/21/2012  . H/O colon cancer, stage I 06/24/2012  . Adenomatous colon polyp 06/24/2012  .  Open wound-left arm and suprapubic area. 03/11/2012  . Chest discomfort 04/21/2011  . Hyperlipidemia 10/14/2010  . Atrial fibrillation ([place]) 08/08/2010  . INSOMNIA, MILD 12/12/2006    Past Surgical History:  Procedure Laterality Date  . COLECTOMY  2000   6 inches removed  . COLONOSCOPY  2009   polyp, h/o cancer [name])  . EYE SURGERY     cataract surgery both eyes- pts ates he has never had cataract surgery 10-26-15  . HERNIA REPAIR  07/5007   DB umbilical hernia  . INCISE AND DRAIN ABCESS  2009,2010,2011,2012  . INCISION AND DRAINAGE Right 04/02/2013   Procedure: INCISION AND DRAINAGE RIGHT KNEE  HEMATOMA;  Surgeon: [name], MD;  Location: [facility];  Service: Orthopedics;  Laterality: Right;  . INCISION AND DRAINAGE ABSCESS N/A 09/20/2012   Procedure: INCISION AND DRAINAGE SUPRAPUBIC ABSCESS;  Surgeon: [name], MD;  Location: [facility];  Service: General;  Laterality: N/A;  . INCISION AND DRAINAGE ABSCESS Left 02/28/2013   Procedure: INCISION AND DRAINAGE LEFT ARM ABSCESS;  Surgeon: [name], MD;  Location: [facility];  Service: General;  Laterality: Left;  . LUMBAR LAMINECTOMY  1977  . MASS EXCISION  03/07/2012   EXCISION MASS;  Surgeon: [name], MD;  Laterality: Right;  removal mass posterior right arm  . POLYPECTOMY    . WOUND EXPLORATION  2006   For possible stitch abscess Dr [name]       Family History  Problem Relation Age of Onset  . Stroke Mother   . Stroke Father   . Hypertension Father   . CAD Father 2       MI  . Dementia Father   . Stroke Sister 87  . Stroke Brother   . Stroke Brother   . Diabetes Brother   . CAD Brother 51       MI  . Dementia Sister 67  . Dementia Brother   . Colon cancer Cousin        x4 total with colon cancer  . Cancer Neg Hx   . Colon polyps Neg Hx   . Rectal cancer Neg Hx   . Stomach cancer Neg Hx     Social History   Tobacco Use  . Smoking status: Former Smoker    Quit date: 10/13/1977    Years since quitting: 42.9  . Smokeless tobacco: Former Systems developer    Types: [place] date: 10/13/1977  Vaping Use  . Vaping Use: Never used  Substance Use Topics  . Alcohol use: No    Alcohol/week: 0.0 standard drinks  . Drug use: No    Home Medications Prior to Admission medications   Medication Sig Start Date End Date Taking? Authorizing Provider  colchicine 0.6 MG tablet Take 1 tablet (0.6 mg total) by mouth daily. 07/10/20 10/08/20 Yes Weaver, Scott T, PA-C  ELIQUIS 5 MG TABS tablet TAKE 1 TABLET BY MOUTH TWICE A DAY Patient taking differently: Take 5 mg by mouth 2 (two) times  daily. 07/19/20  Yes Nahser, Wonda Cheng, MD  hydroxypropyl methylcellulose / hypromellose (ISOPTO TEARS / GONIOVISC) 2.5 % ophthalmic solution Place 1 drop into both eyes in the morning and at bedtime.   Yes [provider]  ibuprofen (ADVIL) 200 MG tablet Take 200 mg by mouth every 6 (six) hours as needed for mild pain or headache.   Yes [provider]  metoprolol tartrate (LOPRESSOR) 25 MG tablet TAKE 1 TABLET BY MOUTH  TWICE A DAY Patient taking differently: Take 25 mg by mouth 2 (two) times daily. 03/22/20  Yes Nahser, Wonda Cheng, MD  polyethylene glycol (MIRALAX / GLYCOLAX) 17 g packet Take 17 g by mouth daily. Patient taking differently: Take 17 g by mouth daily as needed for mild constipation. 07/22/20  Yes Mesner, Corene Cornea, MD  rosuvastatin (CRESTOR) 5 MG tablet Take 1 tablet (5 mg total) by mouth daily. 11/20/19  Yes Nahser, Wonda Cheng, MD  ULTRAM 50 MG tablet Take 50 mg by mouth daily. 08/05/20  Yes [provider]  zolpidem (AMBIEN) 10 MG tablet Take 1 tablet (10 mg total) by mouth at bedtime as needed for sleep. Patient taking differently: Take 10 mg by mouth at bedtime. 01/31/18  Yes Angiulli, Lavon Paganini, PA-C  ibuprofen (ADVIL) 400 MG tablet Take 1 tablet (400 mg total) by mouth 2 (two) times daily. Take for 3 days only then stop. Patient not taking: No sig reported 07/09/20   Richardson Dopp T, PA-C    Allergies    Patient has no known allergies.  Review of Systems   Review of Systems  Constitutional: Negative for fever.  Respiratory: Negative for shortness of breath.   Cardiovascular: Negative for chest pain.  Musculoskeletal: Positive for back pain ( Chronic back pain).  All other systems reviewed and are negative.   Physical Exam Updated Vital Signs BP 139/75   Pulse 67   Temp (!) 97.5 F (36.4 C) (Oral)   Resp 14   Ht 1.727 m (5\' 8" )   Wt 68 kg   SpO2 96%   BMI 22.81 kg/m   Physical Exam CONSTITUTIONAL: Elderly, no acute distress HEAD:  Normocephalic/atraumatic EYES: EOMI ENMT: Mucous membranes moist, poor dentition NECK: supple no meningeal signs CV: Irregular LUNGS: Lungs are clear to auscultation bilaterally, no apparent distress ABDOMEN: soft, nontender, nondistended.  Large Midline scar noted from previous surgery GU:no cva tenderness NEURO: Pt is awake/alert/appropriate, patient has difficulty moving his left leg EXTREMITIES: Bilateral femoral pulses are noted.  Patient has palpable pulse in his right popliteal fossa.  I am able to Doppler a pulse in the right PT. There is no left popliteal or DP/PT pulse.  I am unable to Doppler a pulse in the left foot.  The left leg is cool to touch and mottled.  See photo below SKIN: Left leg is cool to touch, see photo below PSYCH: no abnormalities of mood noted, alert and oriented to situation      ED Results / Procedures / Treatments   Labs (all labs ordered are listed, but only abnormal results are displayed) Labs Reviewed  CBC WITH DIFFERENTIAL/PLATELET - Abnormal; Notable for the following components:      Result Value   RBC 5.90 (*)    RDW 20.8 (*)    Neutro Abs 7.8 (*)    All other components within normal limits  RESP PANEL BY RT-PCR (FLU A&B, COVID) ARPGX2  BASIC METABOLIC PANEL  PROTIME-INR  HEPARIN LEVEL (UNFRACTIONATED)  APTT  TYPE AND SCREEN    EKG EKG Interpretation  Date/Time:  Friday August 27 2020 06:21:21 EDT Ventricular Rate:  70 PR Interval:    QRS Duration: 142 QT Interval:  435 QTC Calculation: 470 R Axis:   -29 Text Interpretation: Atrial fibrillation Left bundle branch block Confirmed by Ripley Fraise [phone]) on 08/27/2020 6:39:04 AM   Radiology No results found.  Procedures .Critical Care Performed by: Ripley Fraise, MD Authorized by: Ripley Fraise, MD   Critical care provider  statement:    Critical care time (minutes):  36   Critical care start time:  08/27/2020 6:48 AM   Critical care end time:  08/27/2020 7:24  AM   Critical care time was exclusive of:  Separately billable procedures and treating other patients   Critical care was necessary to treat or prevent imminent or life-threatening deterioration of the following conditions:  Circulatory failure   Critical care was time spent personally by me on the following activities:  Discussions with consultants, examination of patient, ordering and review of laboratory studies, pulse oximetry, review of old charts and re-evaluation of patient's condition   I assumed direction of critical care for this patient from another provider in my specialty: no       Medications Ordered in ED Medications  heparin ADULT infusion 100 units/mL (25000 units/231mL) (has no administration in time range)    ED Course  I have reviewed the triage vital signs and the nursing notes.  Pertinent labs & imaging results that were available during my care of the patient were reviewed by me and considered in my medical decision making (see chart for details).    MDM Rules/Calculators/A&P                         6:49 AM Patient presents with left leg pain that has been worsening now reporting numbness.  The left leg is mottled from the knee distally and there is no palpable or dopplerable pulses.  I discussed the case with Dr. Stanford Breed with vascular surgery.  He request that I start heparin.  It is unclear if patient has been taking his Eliquis.  Patient has known history of atrial fibrillation 7:25 AM Discussed with daughter Lynelle Smoke was updated on the grave nature of his illness.  She was informed that he may require amputation  Discussed the case with Dr. Stanford Breed with vascular.  He reports patient will need to have an amputation.  This would not be done emergently.  He will need to be admitted to hospitalist. 7:32 AM Signed out to Dr. Zenia Resides at shift change  Final Clinical Impression(s) / ED Diagnoses Final diagnoses:  Arterial occlusion    Rx / DC Orders ED Discharge Orders     None       Ripley Fraise, MD 08/27/20 [phone]

## 2020-08-27 NOTE — ED Notes (Signed)
Christian Maxwell, daughter, [phone] would like an update when available

## 2020-08-27 NOTE — H&P (Addendum)
History and Physical    OLEN EAVES VOZ:[id number] DOB: 1932/11/26 DOA: 08/27/2020  PCP: Ria Bush, MD  Patient coming from: Home  Chief Complaint: Leg pain  HPI: Christian Maxwell is a 87 y.o. male with medical history significant of atrial fibrillation on Eliquis, colon cancer, hypertension, hyperlipidemia.  The patient has had lower left leg pain for the past month.  It is worsened over the last 2 weeks.  No specific injury or change in activity.  It is turned a purplish color as well.  He cannot feel any sensation outside of pain in the leg and foot.  He stopped smoking over 40 years ago.  He reports he has been compliant with his Eliquis.  He is not having any chest pain or shortness of breath.  He does note that his back has been quite painful as well.  ED Course: Patient presented with left leg pain that has been worsening. The vascular surgery team was consulted after concern for arterial occlusion had arisen.  Eliquis was stopped, heparin was started.  White count is 9.9, renal function at baseline.  Review of Systems: As per HPI otherwise 10 point review of systems negative.  Eliquis is stopped.  Heparin was started.    Past Medical History:  Diagnosis Date  . Abscess 7/13   abd abscess  . Cancer [facility])    colon cancer  . Chronic atrial fibrillation ([place])   . History of colon cancer 2000   T3N0 s/p colectomy  . History of echocardiogram    a. Echo 2/13:  Mild LVH, EF 55-60%, mild MR, moderate LAE, mild to moderate RAE;  b. Echo 1/17: mild LVH, vigorous LVF, EF 65-70%, no RWMA, trivial AI, trivial MR, severe LAE, mild RAE, mod TR, PASP 32 mmHg  . History of stress test    a. Cardiolite in 9/04: EF 67%, no ischemia.;  b. Myoview 6/16: no ischemia   . Hx of adenomatous colonic polyps   . Hyperlipidemia   . Hypertension   . METHICILLIN RESISTANT STAPHYLOCOCCUS AUREUS INFECTION 12/12/2006   Annotation: stitch abcess in lower abdomen after colon cancer resection  Qualifier: Diagnosis of  By: [name] MD, [name]    . Myocardial infarction ([place]) 1985  . Postoperative stitch abscess 07/19/2011  . PVC's (premature ventricular contractions)   . Syncope and collapse    pt denies  . Thyroid disease    followed by [name]    Past Surgical History:  Procedure Laterality Date  . COLECTOMY  2000   6 inches removed  . COLONOSCOPY  2009   polyp, h/o cancer [name])  . EYE SURGERY     cataract surgery both eyes- pts ates he has never had cataract surgery 10-26-15  . HERNIA REPAIR  08/2593   DB umbilical hernia  . INCISE AND DRAIN ABCESS  2009,2010,2011,2012  . INCISION AND DRAINAGE Right 04/02/2013   Procedure: INCISION AND DRAINAGE RIGHT KNEE HEMATOMA;  Surgeon: [name], MD;  Location: [place];  Service: Orthopedics;  Laterality: Right;  . INCISION AND DRAINAGE ABSCESS N/A 09/20/2012   Procedure: INCISION AND DRAINAGE SUPRAPUBIC ABSCESS;  Surgeon: [name], MD;  Location: [place];  Service: General;  Laterality: N/A;  . INCISION AND DRAINAGE ABSCESS Left 02/28/2013   Procedure: INCISION AND DRAINAGE LEFT ARM ABSCESS;  Surgeon: [name], MD;  Location: [place];  Service: General;  Laterality: Left;  . LUMBAR LAMINECTOMY  1977  . MASS EXCISION  03/07/2012  EXCISION MASS;  Surgeon: [name], MD;  Laterality: Right;  removal mass posterior right arm  . POLYPECTOMY    . WOUND EXPLORATION  2006   For possible stitch abscess Dr [name]     reports that he quit smoking about 42 years ago. He quit smokeless tobacco use about 42 years ago.  His smokeless tobacco use included chew. He reports that he does not drink alcohol and does not use drugs.  No Known Allergies  Family History  Problem Relation Age of Onset  . Stroke Mother   . Stroke Father   . Hypertension Father   . CAD Father 50       MI  . Dementia Father   . Stroke Sister 62  . Stroke Brother   . Stroke Brother   . Diabetes Brother   .  CAD Brother 31       MI  . Dementia Sister 27  . Dementia Brother   . Colon cancer Cousin        x4 total with colon cancer  . Cancer Neg Hx   . Colon polyps Neg Hx   . Rectal cancer Neg Hx   . Stomach cancer Neg Hx     Prior to Admission medications   Medication Sig Start Date End Date Taking? Authorizing Provider  colchicine 0.6 MG tablet Take 1 tablet (0.6 mg total) by mouth daily. 07/10/20 10/08/20 Yes Weaver, Scott T, PA-C  ELIQUIS 5 MG TABS tablet TAKE 1 TABLET BY MOUTH TWICE A DAY Patient taking differently: Take 5 mg by mouth 2 (two) times daily. 07/19/20  Yes Nahser, Wonda Cheng, MD  hydroxypropyl methylcellulose / hypromellose (ISOPTO TEARS / GONIOVISC) 2.5 % ophthalmic solution Place 1 drop into both eyes in the morning and at bedtime.   Yes [provider]  ibuprofen (ADVIL) 200 MG tablet Take 200 mg by mouth every 6 (six) hours as needed for mild pain or headache.   Yes [provider]  metoprolol tartrate (LOPRESSOR) 25 MG tablet TAKE 1 TABLET BY MOUTH TWICE A DAY Patient taking differently: Take 25 mg by mouth 2 (two) times daily. 03/22/20  Yes Nahser, Wonda Cheng, MD  polyethylene glycol (MIRALAX / GLYCOLAX) 17 g packet Take 17 g by mouth daily. Patient taking differently: Take 17 g by mouth daily as needed for mild constipation. 07/22/20  Yes Mesner, Corene Cornea, MD  rosuvastatin (CRESTOR) 5 MG tablet Take 1 tablet (5 mg total) by mouth daily. 11/20/19  Yes Nahser, Wonda Cheng, MD  ULTRAM 50 MG tablet Take 50 mg by mouth daily. 08/05/20  Yes [provider]  zolpidem (AMBIEN) 10 MG tablet Take 1 tablet (10 mg total) by mouth at bedtime as needed for sleep. Patient taking differently: Take 10 mg by mouth at bedtime. 01/31/18  Yes Angiulli, Lavon Paganini, PA-C  ibuprofen (ADVIL) 400 MG tablet Take 1 tablet (400 mg total) by mouth 2 (two) times daily. Take for 3 days only then stop. Patient not taking: No sig reported 07/09/20   Richardson Dopp T, [place]    Physical  Exam: Vitals:   08/27/20 0551 08/27/20 0600 08/27/20 0730 08/27/20 0830  BP:  134/85  (!) 155/90  Pulse:  63 62 74  Resp:  16 16 20   Temp:    (!) 97.5 F (36.4 C)  TempSrc:    Oral  SpO2:  95% 96% 100%  Weight: 68 kg     Height: 5\' 8"  (1.727 m)  Constitutional: NAD, calm, comfortable Eyes: PERRL, lids and conjunctivae normal ENMT: Mucous membranes are dry in the mouth. Posterior pharynx clear of any exudate or lesions.Normal dentition.  Neck: normal, supple, no masses, no thyromegaly Respiratory: clear to auscultation bilaterally, no wheezing, no crackles. Normal respiratory effort. No accessory muscle use.  Cardiovascular: RRR. No extremity edema.  I do not appreciate a posterior tibial or dorsal pedis pulse on the left.  Abdomen: no tenderness, no masses palpated. No hepatosplenomegaly. Bowel sounds positive.  Musculoskeletal: No bony or joint deformities noted.  There is tenderness to palpation over the left calf and foot; from the knee to the toes, it is very cold to the touch compared to the right. Skin: no ulcerations, there is a purplish patch that is blotchy and present over the left lower extremity and over portions of the foot. Neurologic: CN 2-12 grossly intact. Sensation intact, DTR normal. Strength 5/5 in all 4.  Psychiatric:  Alert and oriented x 3, could not remember the name of the president. Normal mood.     Labs on Admission: I have personally reviewed following labs and imaging studies  CBC: Recent Labs  Lab 08/27/20 0645  WBC 9.9  NEUTROABS 7.8*  HGB 15.6  HCT 49.4  MCV 83.7  PLT 756   Basic Metabolic Panel: Recent Labs  Lab 08/27/20 0645  NA 142  K 3.5  CL 108  CO2 28  GLUCOSE 99  BUN 34*  CREATININE 0.92  CALCIUM 10.0   GFR: Estimated Creatinine Clearance: 56.5 mL/min (by C-G formula based on SCr of 0.92 mg/dL).  Coagulation Profile: Recent Labs  Lab 08/27/20 0645  INR 1.4*   Urine analysis:    Component Value Date/Time    COLORURINE YELLOW 07/22/2020 0602   APPEARANCEUR CLEAR 07/22/2020 0602   LABSPEC 1.019 07/22/2020 0602   PHURINE 5.0 07/22/2020 0602   GLUCOSEU NEGATIVE 07/22/2020 0602   HGBUR NEGATIVE 07/22/2020 0602   BILIRUBINUR NEGATIVE 07/22/2020 0602   KETONESUR NEGATIVE 07/22/2020 0602   PROTEINUR NEGATIVE 07/22/2020 0602   UROBILINOGEN 0.2 07/17/2012 0950   NITRITE NEGATIVE 07/22/2020 0602   LEUKOCYTESUR NEGATIVE 07/22/2020 0602    Recent Results (from the past 240 hour(s))  Resp Panel by RT-PCR (Flu A&B, Covid) Nasopharyngeal Swab     Status: None   Collection Time: 08/27/20  6:59 AM   Specimen: Nasopharyngeal Swab; Nasopharyngeal(NP) swabs in vial transport medium  Result Value Ref Range Status   SARS Coronavirus 2 by RT PCR NEGATIVE NEGATIVE Final    Comment: (NOTE) SARS-CoV-2 target nucleic acids are NOT DETECTED.  The SARS-CoV-2 RNA is generally detectable in upper respiratory specimens during the acute phase of infection. The lowest concentration of SARS-CoV-2 viral copies this assay can detect is 138 copies/mL. A negative result does not preclude SARS-Cov-2 infection and should not be used as the sole basis for treatment or other patient management decisions. A negative result may occur with  improper specimen collection/handling, submission of specimen other than nasopharyngeal swab, presence of viral mutation(s) within the areas targeted by this assay, and inadequate number of viral copies(<138 copies/mL). A negative result must be combined with clinical observations, patient history, and epidemiological information. The expected result is Negative.  Fact Sheet for Patients:  EntrepreneurPulse.com.au  Fact Sheet for Healthcare Providers:  IncredibleEmployment.be  This test is no t yet approved or cleared by the [place] FDA and  has been authorized for detection and/or diagnosis of SARS-CoV-2 by FDA under an Emergency Use  Authorization (EUA). This  EUA will remain  in effect (meaning this test can be used) for the duration of the COVID-19 declaration under Section 564(b)(1) of the Act, 21 U.S.C.section 360bbb-3(b)(1), unless the authorization is terminated  or revoked sooner.       Influenza A by PCR NEGATIVE NEGATIVE Final   Influenza B by PCR NEGATIVE NEGATIVE Final    Comment: (NOTE) The Xpert Xpress SARS-CoV-2/FLU/RSV plus assay is intended as an aid in the diagnosis of influenza from Nasopharyngeal swab specimens and should not be used as a sole basis for treatment. Nasal washings and aspirates are unacceptable for Xpert Xpress SARS-CoV-2/FLU/RSV testing.  Fact Sheet for Patients: EntrepreneurPulse.com.au  Fact Sheet for Healthcare Providers: IncredibleEmployment.be  This test is not yet approved or cleared by the [place] FDA and has been authorized for detection and/or diagnosis of SARS-CoV-2 by FDA under an Emergency Use Authorization (EUA). This EUA will remain in effect (meaning this test can be used) for the duration of the COVID-19 declaration under Section 564(b)(1) of the Act, 21 U.S.C. section 360bbb-3(b)(1), unless the authorization is terminated or revoked.  Performed at [facility], [place] [address], [place], [place] [address]     EKG: Independently reviewed.   Assessment/Plan Principal Problem:   Arterial occlusion lower extremity (HCC)  Appreciate vascular surgery  Morphine 2 mg every 3 hours for severe pain  Oxycodone 5 mg every 6 hours for moderate pain  Tylenol 650 mg every 6 hours for mild pain  N.p.o.  Heparin infusion  Lactated Ringer's while n.p.o. 75 mL/h  Echo and CTA per vascular surgery recommendations  Active Problems:   Atrial fibrillation (HCC)  Hold Eliquis  Continue metoprolol 25 mg twice daily    Hyperlipidemia  Continue Crestor 5 mg daily    Essential hypertension  Metoprolol as above  DVT  prophylaxis: Patient was started on heparin infusion as above Code Status: Full  Family Communication: Self Disposition Plan: May need skilled nursing after amputation Consults called: Vascular surgery called by emergency department team Admission status: Inpatient  Severity of Illness: The appropriate patient status for this patient is INPATIENT. Inpatient status is judged to be reasonable and necessary in order to provide the required intensity of service to ensure the patient's safety. The patient's presenting symptoms, physical exam findings, and initial radiographic and laboratory data in the context of their chronic comorbidities is felt to place them at high risk for further clinical deterioration. Furthermore, it is not anticipated that the patient will be medically stable for discharge from the hospital within 2 midnights of admission. The following factors support the patient status of inpatient.   " The patient's presenting symptoms include left leg pain and numbness. " The worrisome physical exam findings include discoloration of the skin and lack of pulses of the left foot. " The initial radiographic and laboratory data are worrisome because of: N/A. " The chronic co-morbidities include atrial fibrillation, hyperlipidemia, hypertension.   * I certify that at the point of admission it is my clinical judgment that the patient will require inpatient hospital care spanning beyond 2 midnights from the point of admission due to high intensity of service, high risk for further deterioration and high frequency of surveillance required.*   Shelda Pal, DO [facility] [email or web address] 08/27/2020, 9:22 AM

## 2020-08-28 DIAGNOSIS — I998 Other disorder of circulatory system: Secondary | ICD-10-CM

## 2020-08-28 DIAGNOSIS — I10 Essential (primary) hypertension: Secondary | ICD-10-CM

## 2020-08-28 DIAGNOSIS — I214 Non-ST elevation (NSTEMI) myocardial infarction: Secondary | ICD-10-CM

## 2020-08-28 DIAGNOSIS — Z7189 Other specified counseling: Secondary | ICD-10-CM

## 2020-08-28 DIAGNOSIS — M79605 Pain in left leg: Secondary | ICD-10-CM

## 2020-08-28 DIAGNOSIS — Z515 Encounter for palliative care: Secondary | ICD-10-CM

## 2020-08-28 DIAGNOSIS — I482 Chronic atrial fibrillation, unspecified: Secondary | ICD-10-CM

## 2020-08-28 DIAGNOSIS — E782 Mixed hyperlipidemia: Secondary | ICD-10-CM

## 2020-08-28 DIAGNOSIS — Z66 Do not resuscitate: Secondary | ICD-10-CM

## 2020-08-28 LAB — APTT
aPTT: 64 seconds — ABNORMAL HIGH (ref 24–36)
aPTT: 88 seconds — ABNORMAL HIGH (ref 24–36)

## 2020-08-28 LAB — BASIC METABOLIC PANEL
Anion gap: 10 (ref 5–15)
BUN: 31 mg/dL — ABNORMAL HIGH (ref 8–23)
CO2: 27 mmol/L (ref 22–32)
Calcium: 9.7 mg/dL (ref 8.9–10.3)
Chloride: 108 mmol/L (ref 98–111)
Creatinine, Ser: 0.88 mg/dL (ref 0.61–1.24)
GFR, Estimated: >60 mL/min (ref >60)
Glucose, Bld: 83 mg/dL (ref 70–99)
Potassium: 3.7 mmol/L (ref 3.5–5.1)
Sodium: 145 mmol/L (ref 135–145)

## 2020-08-28 LAB — CBC
HCT: 49.1 % (ref 39.0–52.0)
Hemoglobin: 15.4 g/dL (ref 13.0–17.0)
MCH: 26.2 pg (ref 26.0–34.0)
MCHC: 31.4 g/dL (ref 30.0–36.0)
MCV: 83.6 fL (ref 80.0–100.0)
Platelets: 176 10*3/uL (ref 150–400)
RBC: 5.87 MIL/uL — ABNORMAL HIGH (ref 4.22–5.81)
RDW: 20.8 % — ABNORMAL HIGH (ref 11.5–15.5)
WBC: 10.1 10*3/uL (ref 4.0–10.5)
nRBC: 0 % (ref 0.0–0.2)

## 2020-08-28 LAB — TROPONIN I (HIGH SENSITIVITY)
Troponin I (High Sensitivity): 4274 ng/L (ref <18)
Troponin I (High Sensitivity): 4391 ng/L (ref <18)
Troponin I (High Sensitivity): 2822 ng/L (ref <18)

## 2020-08-28 LAB — HEPARIN LEVEL (UNFRACTIONATED): Heparin Unfractionated: 0.9 IU/mL — ABNORMAL HIGH (ref 0.30–0.70)

## 2020-08-28 MED ORDER — GLYCOPYRROLATE 0.2 MG/ML IJ SOLN: 0.2000 mg | INTRAMUSCULAR | Status: DC | PRN

## 2020-08-28 MED ORDER — MORPHINE SULFATE (PF) 2 MG/ML IV SOLN: 2.0000 mg | INTRAVENOUS | Status: DC | PRN

## 2020-08-28 MED ORDER — BIOTENE DRY MOUTH MT LIQD: 15.0000 mL | OROMUCOSAL | Status: DC | PRN

## 2020-08-28 MED ORDER — LOSARTAN POTASSIUM 25 MG PO TABS: 12.5000 mg | ORAL_TABLET | Freq: Every day | ORAL | Status: DC

## 2020-08-28 MED ORDER — MORPHINE BOLUS VIA INFUSION
2.0000 mg | INTRAVENOUS | Status: DC | PRN
  Filled 2020-08-28: qty 2

## 2020-08-28 MED ORDER — HALOPERIDOL LACTATE 2 MG/ML PO CONC
0.5000 mg | ORAL | Status: DC | PRN
Start: 2020-08-28 — End: 2020-08-31
  Filled 2020-08-28: qty 0.3

## 2020-08-28 MED ORDER — POLYVINYL ALCOHOL 1.4 % OP SOLN
1.0000 [drp] | Freq: Four times a day (QID) | OPHTHALMIC | Status: DC | PRN
  Filled 2020-08-28 (×2): qty 15

## 2020-08-28 MED ORDER — GLYCOPYRROLATE 1 MG PO TABS
1.0000 mg | ORAL_TABLET | ORAL | Status: DC | PRN
  Filled 2020-08-28: qty 1

## 2020-08-28 MED ORDER — HALOPERIDOL 0.5 MG PO TABS
0.5000 mg | ORAL_TABLET | ORAL | Status: DC | PRN
  Filled 2020-08-28: qty 1

## 2020-08-28 MED ORDER — ASPIRIN EC 81 MG PO TBEC
81.0000 mg | DELAYED_RELEASE_TABLET | Freq: Every day | ORAL | Status: DC
  Administered 2020-08-28: 81 mg via ORAL
  Filled 2020-08-28: qty 1

## 2020-08-28 MED ORDER — LOSARTAN POTASSIUM 25 MG PO TABS
25.0000 mg | ORAL_TABLET | Freq: Every day | ORAL | Status: DC
  Administered 2020-08-28: 25 mg via ORAL
  Filled 2020-08-28: qty 1

## 2020-08-28 MED ORDER — HALOPERIDOL LACTATE 5 MG/ML IJ SOLN: 0.5000 mg | INTRAMUSCULAR | Status: DC | PRN

## 2020-08-28 MED ORDER — MORPHINE 100MG IN NS 100ML (1MG/ML) PREMIX INFUSION
2.0000 mg/h | INTRAVENOUS | Status: DC
  Administered 2020-08-28: 2 mg/h via INTRAVENOUS
  Administered 2020-08-30: 4 mg/h via INTRAVENOUS
  Filled 2020-08-28 (×2): qty 100

## 2020-08-28 NOTE — Progress Notes (Addendum)
PROGRESS NOTE    Christian Maxwell   GBT:[id number]  DOB: May 18, 1932  DOA: 08/27/2020 PCP: Christian Bush, MD   Brief Narrative:  Christian Maxwell  is a 87 y.o. male with medical history significant of atrial fibrillation on Eliquis, colon cancer, hypertension,CVA, hyperlipidemia & dementia. The patient has had lower left leg pain for the past month.  It is worsened over the last 2 weeks.  No specific injury or change in activity.  It is turned a purplish color as well.  He cannot feel any sensation outside of pain in the leg and foot.  He reports he has been compliant with his Eliquis.  He has caretakers at home and for about 1 wk and they have been giving him his medications. His daughter Christian Maxwell does say that he has dementia.  Subjective: Having some back pain. Pain in his left leg is present but not severe. His says left foot is numb.     Assessment & Plan:   Principal Problem:   Arterial occlusion, lower extremity - AKA recommended by vascular surgery but declined by patient - Vascular surgery has signed off - cont Heparin infusion   - I have consulted palliative care  Addendum: I met with the daughters and met again with the patient. I spoke for an extended period of time with his daughters and palliative care prior to speaking with the patient again. On repeat exam, leg is more mottled & purplish and the gangrene on the first left toe has progressed from this AM. The daughters did tell me that he likely has not taken his medications (including DOAC) since 3/30.  I explained to them that his death with be within days if an AKA is not done. The patient and daughters understand and have opted for comfort care. Palliative care has transitioned him to full comfort. I appreciate palliative care's assistance.  Active Problems:  NSTEMI - incidental finding on EKG- inferior leads revealed  T wave inversions -  he did not have any chest pain - Trop max 4,391 this am and now trending  down - cont Heparin infusion    Atrial fibrillation - he states he was compliant with Eliquis   - 2 D ECHO does not reveal a thrombus - CTA did not reveal a thrombus - rate in 70s   Mild compression fracture of T8  - noted on CTA - cannot tell if this is acute or chronic - I am unable to turn him to evaluate his back - cont PRN pain meds  Dementia - per daughter he has short term forgetfulness    Hyperlipidemia - cont Crestor    Essential hypertension - cont Cozaar  Time spent in minutes: > 1 hour- Greater than 50 % of time taken in speaking with family in presence of palliative care DVT prophylaxis: Heparin infusion Code Status: Full code Family Communication:  Both daughters  Level of Care: Level of care: Med-Surg Disposition Plan:  Status is: Inpatient  Remains inpatient appropriate because:IV treatments appropriate due to intensity of illness or inability to take PO   Dispo: The patient is from: Home              Anticipated d/c is to: Hospice home              Patient currently is not medically stable to d/c.   Difficult to place patient No   Consultants:   Vascular surgery  Cardiology  Palliative  Procedures:   2 d  ECHO Antimicrobials:  Anti-infectives (From admission, onward)   None       Objective: Vitals:   08/27/20 1920 08/27/20 2313 08/28/20 0000 08/28/20 0822  BP: (!) 143/86   (!) 154/72  Pulse:    74  Resp: '18 20  20  ' Temp: 97.8 F (36.6 C) 98.8 F (37.1 C)  98.7 F (37.1 C)  TempSrc: Oral Oral  Oral  SpO2: 96% 94% 94% 94%  Weight:      Height:        Intake/Output Summary (Last 24 hours) at 08/28/2020 1205 Last data filed at 08/28/2020 1916 Gross per 24 hour  Intake 1494.79 ml  Output --  Net 1494.79 ml   Filed Weights   08/27/20 0551  Weight: 68 kg    Examination: General exam: Appears comfortable  HEENT: PERRLA, oral mucosa moist, no sclera icterus or thrush Respiratory system: Clear to auscultation. Respiratory  effort normal. Cardiovascular system: S1 & S2 heard, IIRR.   Gastrointestinal system: Abdomen soft, non-tender, nondistended. Normal bowel sounds. Central nervous system: Alert and oriented. No focal neurological deficits. Extremities: No cyanosis, clubbing or edema- left leg mottled, cool to touch below the knee Skin: No rashes or ulcers Psychiatry:  Mood & affect appropriate. Tends to speak with me with his eyes closed    Data Reviewed: I have personally reviewed following labs and imaging studies  CBC: Recent Labs  Lab 08/27/20 0645 08/28/20 0302  WBC 9.9 10.1  NEUTROABS 7.8*  --   HGB 15.6 15.4  HCT 49.4 49.1  MCV 83.7 83.6  PLT 166 606   Basic Metabolic Panel: Recent Labs  Lab 08/27/20 0645 08/28/20 0302  NA 142 145  K 3.5 3.7  CL 108 108  CO2 28 27  GLUCOSE 99 83  BUN 34* 31*  CREATININE 0.92 0.88  CALCIUM 10.0 9.7   GFR: Estimated Creatinine Clearance: 59 mL/min (by C-G formula based on SCr of 0.88 mg/dL). Liver Function Tests: No results for input(s): AST, ALT, ALKPHOS, BILITOT, PROT, ALBUMIN in the last 168 hours. No results for input(s): LIPASE, AMYLASE in the last 168 hours. No results for input(s): AMMONIA in the last 168 hours. Coagulation Profile: Recent Labs  Lab 08/27/20 0645  INR 1.4*   Cardiac Enzymes: No results for input(s): CKTOTAL, CKMB, CKMBINDEX, TROPONINI in the last 168 hours. BNP (last 3 results) Recent Labs    07/09/20 1220  PROBNP 747*   HbA1C: No results for input(s): HGBA1C in the last 72 hours. CBG: No results for input(s): GLUCAP in the last 168 hours. Lipid Profile: No results for input(s): CHOL, HDL, LDLCALC, TRIG, CHOLHDL, LDLDIRECT in the last 72 hours. Thyroid Function Tests: No results for input(s): TSH, T4TOTAL, FREET4, T3FREE, THYROIDAB in the last 72 hours. Anemia Panel: No results for input(s): VITAMINB12, FOLATE, FERRITIN, TIBC, IRON, RETICCTPCT in the last 72 hours. Urine analysis:    Component Value  Date/Time   COLORURINE YELLOW 07/22/2020 0602   APPEARANCEUR CLEAR 07/22/2020 0602   LABSPEC 1.019 07/22/2020 0602   PHURINE 5.0 07/22/2020 0602   GLUCOSEU NEGATIVE 07/22/2020 0602   HGBUR NEGATIVE 07/22/2020 0602   BILIRUBINUR NEGATIVE 07/22/2020 0602   KETONESUR NEGATIVE 07/22/2020 0602   PROTEINUR NEGATIVE 07/22/2020 0602   UROBILINOGEN 0.2 07/17/2012 0950   NITRITE NEGATIVE 07/22/2020 0602   LEUKOCYTESUR NEGATIVE 07/22/2020 0602   Sepsis Labs: '@LABRCNTIP' (procalcitonin:4,lacticidven:4) ) Recent Results (from the past 240 hour(s))  Resp Panel by RT-PCR (Flu A&B, Covid) Nasopharyngeal Swab     Status: None  Collection Time: 08/27/20  6:59 AM   Specimen: Nasopharyngeal Swab; Nasopharyngeal(NP) swabs in vial transport medium  Result Value Ref Range Status   SARS Coronavirus 2 by RT PCR NEGATIVE NEGATIVE Final    Comment: (NOTE) SARS-CoV-2 target nucleic acids are NOT DETECTED.  The SARS-CoV-2 RNA is generally detectable in upper respiratory specimens during the acute phase of infection. The lowest concentration of SARS-CoV-2 viral copies this assay can detect is 138 copies/mL. A negative result does not preclude SARS-Cov-2 infection and should not be used as the sole basis for treatment or other patient management decisions. A negative result may occur with  improper specimen collection/handling, submission of specimen other than nasopharyngeal swab, presence of viral mutation(s) within the areas targeted by this assay, and inadequate number of viral copies(<138 copies/mL). A negative result must be combined with clinical observations, patient history, and epidemiological information. The expected result is Negative.  Fact Sheet for Patients:  EntrepreneurPulse.com.au  Fact Sheet for Healthcare Providers:  IncredibleEmployment.be  This test is no t yet approved or cleared by the [place] FDA and  has been authorized for detection  and/or diagnosis of SARS-CoV-2 by FDA under an Emergency Use Authorization (EUA). This EUA will remain  in effect (meaning this test can be used) for the duration of the COVID-19 declaration under Section 564(b)(1) of the Act, 21 U.S.C.section 360bbb-3(b)(1), unless the authorization is terminated  or revoked sooner.       Influenza A by PCR NEGATIVE NEGATIVE Final   Influenza B by PCR NEGATIVE NEGATIVE Final    Comment: (NOTE) The Xpert Xpress SARS-CoV-2/FLU/RSV plus assay is intended as an aid in the diagnosis of influenza from Nasopharyngeal swab specimens and should not be used as a sole basis for treatment. Nasal washings and aspirates are unacceptable for Xpert Xpress SARS-CoV-2/FLU/RSV testing.  Fact Sheet for Patients: EntrepreneurPulse.com.au  Fact Sheet for Healthcare Providers: IncredibleEmployment.be  This test is not yet approved or cleared by the [place] FDA and has been authorized for detection and/or diagnosis of SARS-CoV-2 by FDA under an Emergency Use Authorization (EUA). This EUA will remain in effect (meaning this test can be used) for the duration of the COVID-19 declaration under Section 564(b)(1) of the Act, 21 U.S.C. section 360bbb-3(b)(1), unless the authorization is terminated or revoked.  Performed at [facility], [place] [address], [place], [place] [address]          Radiology Studies: CT ANGIO CHEST PE W OR WO CONTRAST  Result Date: 08/27/2020 CLINICAL DATA:  Pt to ED for mottled, cold LLE; Pt has no palpable or Doppler pulses; No options for re-vascularization per Vascular. Hx of A-Fib EXAM: CT ANGIOGRAPHY CHEST WITH CONTRAST TECHNIQUE: Multidetector CT imaging of the chest was performed using the standard protocol during bolus administration of intravenous contrast. Multiplanar CT image reconstructions and MIPs were obtained to evaluate the vascular anatomy. CONTRAST:  133m OMNIPAQUE IOHEXOL 350  MG/ML SOLN COMPARISON:  07/07/2020 FINDINGS: Cardiovascular: Pulmonary arteries well opacified. Study mildly limited due to respiratory motion. Allowing for the motion limitation, there is no evidence of a pulmonary embolism. Heart top-normal in size. Mild left coronary artery calcifications. No pericardial effusion. Thoracic aorta not opacified. Aorta normal in caliber. Mild atherosclerotic calcifications. Mediastinum/Nodes: No enlarged mediastinal, hilar, or axillary lymph nodes. Thyroid gland, trachea, and esophagus demonstrate no significant findings. Lungs/Pleura: Mild dependent subsegmental atelectasis. No evidence of pneumonia or pulmonary edema. No mass or suspicious nodule. No pleural effusion or pneumothorax. Atelectasis improved from the prior  study. Upper Abdomen: No acute findings. Musculoskeletal: Mild compression fracture of T8, with a fracture line visible cross the anterior lower endplate, associated with sclerosis, new since the prior CT. Mild to moderate chronic compression fracture of T4, unchanged. No other fractures. No bone lesions. Review of the MIP images confirms the above findings. IMPRESSION: 1. No evidence of a pulmonary embolism. Study mildly degraded by respiratory motion. 2. Mild compression fracture of T8 that is new when compared to the prior CT, but of unclear acuity. 3. No other evidence of an acute or recent abnormality on the chest CT. 4. Mild dependent atelectasis in the lungs improved compared to the prior CT. No evidence of pneumonia or pulmonary edema. 5. Aortic atherosclerosis. Aortic Atherosclerosis (ICD10-I70.0). Electronically Signed   By: Lajean Manes M.D.   On: 08/27/2020 12:29   ECHOCARDIOGRAM COMPLETE  Result Date: 08/27/2020    ECHOCARDIOGRAM REPORT   Patient Name:   KEEFE ZAWISTOWSKI Wanamaker Date of Exam: 08/27/2020 Medical Rec #:  [id number]       Height:       68.0 in Accession #:    [id number]      Weight:       150.0 lb Date of Birth:  Sep 28, 1932       BSA:           1.809 m Patient Age:    17 years        BP:           154/112 mmHg Patient Gender: M               HR:           86 bpm. Exam Location:  Inpatient Procedure: 2D Echo, Cardiac Doppler and Color Doppler Indications:    Atrial fibrillation  History:        Patient has prior history of Echocardiogram examinations, most                 recent 01/21/2018. Previous Myocardial Infarction, Arrythmias:PVC                 and Atrial Fibrillation, Signs/Symptoms:Syncope; Risk                 Factors:Hypertension and Dyslipidemia.  Sonographer:    Luisa Hart RDCS Referring Phys: [id number] Crosby Oyster O'Donnell  1. Left ventricular ejection fraction, by estimation, is 55 to 60%. The left ventricle has normal function. The left ventricle has no regional wall motion abnormalities. There is mild concentric left ventricular hypertrophy. Left ventricular diastolic function could not be evaluated.  2. Right ventricular systolic function is mildly reduced. The right ventricular size is normal. There is normal pulmonary artery systolic pressure.  3. Left atrial size was severely dilated.  4. The mitral valve is grossly normal. Trivial mitral valve regurgitation.  5. The aortic valve is tricuspid. There is mild calcification of the aortic valve. There is mild thickening of the aortic valve. Aortic valve regurgitation is mild. Mild aortic valve sclerosis is present, with no evidence of aortic valve stenosis.  6. Aortic dilatation noted. There is borderline dilatation of the ascending aorta, measuring 38 mm.  7. The inferior vena cava is normal in size with greater than 50% respiratory variability, suggesting right atrial pressure of 3 mmHg. Comparison(s): No significant change from prior study. Conclusion(s)/Recommendation(s): Otherwise normal echocardiogram, with minor abnormalities described in the report. FINDINGS  Left Ventricle: Left ventricular ejection fraction, by estimation, is 55 to 60%. The left ventricle has  normal  function. The left ventricle has no regional wall motion abnormalities. The left ventricular internal cavity size was small. There is mild concentric left ventricular hypertrophy. Abnormal (paradoxical) septal motion, consistent with left bundle branch block. Left ventricular diastolic function could not be evaluated due to atrial fibrillation. Left ventricular diastolic function could not be evaluated. Right Ventricle: The right ventricular size is normal. Right vetricular wall thickness was not well visualized. Right ventricular systolic function is mildly reduced. There is normal pulmonary artery systolic pressure. The tricuspid regurgitant velocity is 2.44 m/s, and with an assumed right atrial pressure of 3 mmHg, the estimated right ventricular systolic pressure is 73.2 mmHg. Left Atrium: Left atrial size was severely dilated. Right Atrium: Right atrial size was normal in size. Pericardium: There is no evidence of pericardial effusion. Mitral Valve: The mitral valve is grossly normal. Trivial mitral valve regurgitation. Tricuspid Valve: The tricuspid valve is normal in structure. Tricuspid valve regurgitation is mild. Aortic Valve: The aortic valve is tricuspid. There is mild calcification of the aortic valve. There is mild thickening of the aortic valve. Aortic valve regurgitation is mild. Aortic regurgitation PHT measures 636 msec. Mild aortic valve sclerosis is present, with no evidence of aortic valve stenosis. Aortic valve mean gradient measures 2.0 mmHg. Aortic valve peak gradient measures 3.6 mmHg. Aortic valve area, by VTI measures 2.46 cm. Pulmonic Valve: The pulmonic valve was not well visualized. Pulmonic valve regurgitation is not visualized. No evidence of pulmonic stenosis. Aorta: Aortic dilatation noted. There is borderline dilatation of the ascending aorta, measuring 38 mm. Venous: The inferior vena cava is normal in size with greater than 50% respiratory variability, suggesting right atrial  pressure of 3 mmHg. IAS/Shunts: The atrial septum is grossly normal.  LEFT VENTRICLE PLAX 2D LVIDd:         5.10 cm LVIDs:         2.80 cm LV PW:         1.20 cm LV IVS:        1.00 cm LVOT diam:     1.90 cm LV SV:         37 LV SV Index:   20 LVOT Area:     2.84 cm  LV Volumes (MOD) LV vol d, MOD A2C: 28.9 ml LV vol d, MOD A4C: 39.6 ml LV vol s, MOD A2C: 17.8 ml LV vol s, MOD A4C: 26.5 ml LV SV MOD A2C:     11.1 ml LV SV MOD A4C:     39.6 ml LV SV MOD BP:      12.2 ml RIGHT VENTRICLE RV S prime:     5.70 cm/s TAPSE (M-mode): 1.3 cm LEFT ATRIUM              Index       RIGHT ATRIUM           Index LA Vol (A2C):   34.6 ml  19.13 ml/m RA Area:     11.50 cm LA Vol (A4C):   108.0 ml 59.72 ml/m RA Volume:   17.40 ml  9.62 ml/m LA Biplane Vol: 61.5 ml  34.01 ml/m  AORTIC VALVE                   PULMONIC VALVE AV Area (Vmax):    2.41 cm    PV Vmax:       1.00 m/s AV Area (Vmean):   2.58 cm    PV Vmean:      72.000  cm/s AV Area (VTI):     2.46 cm    PV VTI:        0.162 m AV Vmax:           94.90 cm/s  PV Peak grad:  4.0 mmHg AV Vmean:          61.800 cm/s PV Mean grad:  2.5 mmHg AV VTI:            0.150 m AV Peak Grad:      3.6 mmHg AV Mean Grad:      2.0 mmHg LVOT Vmax:         80.80 cm/s LVOT Vmean:        56.200 cm/s LVOT VTI:          0.130 m LVOT/AV VTI ratio: 0.87 AI PHT:            636 msec  AORTA Ao Root diam: 3.40 cm Ao Asc diam:  3.80 cm MITRAL VALVE               TRICUSPID VALVE MV Area (PHT): 3.77 cm    TR Peak grad:   23.8 mmHg MV Decel Time: 201 msec    TR Vmax:        244.00 cm/s MV E velocity: 67.80 cm/s                            SHUNTS                            Systemic VTI:  0.13 m                            Systemic Diam: 1.90 cm Buford Dresser MD Electronically signed by Buford Dresser MD Signature Date/Time: 08/27/2020/1:18:20 PM    Final       Scheduled Meds: . aspirin EC  81 mg Oral Daily  . losartan  25 mg Oral Daily  . rosuvastatin  5 mg Oral Daily   Continuous  Infusions: . heparin 1,350 Units/hr (08/28/20 9021)  . lactated ringers 75 mL/hr at 08/28/20 0642     LOS: 1 day      Debbe Odea, MD [facility] Pager: [email or web address] 08/28/2020, 12:05 PM

## 2020-08-28 NOTE — Progress Notes (Signed)
Overnight floor coverage progress note  Patient with history of hypertension, hyperlipidemia, A. fib admitted for arterial occlusion of the left lower extremity and currently on IV heparin.  Notified by RN that patient remains in A. fib with heart rate in the 60s but intermittently dropping down to the 40s. Asymptomatic. He did receive metoprolol around 9 PM.  -Hold metoprolol -EKG showing A. fib and T wave inversions in inferior leads which appear more prominent.  Stat troponin level checked and came back significantly elevated at 4274.  Patient is not endorsing chest pain.  Heart rate currently in the 60s to 70s. -CT angiogram done yesterday was negative for PE. -Echocardiogram done yesterday revealed normal LVEF of 55 to 60% with no regional wall motion abnormalities. -Discussed with on-call cardiologist Dr. Alfred Levins.  He recommends continuing IV heparin and trending troponin.

## 2020-08-28 NOTE — Progress Notes (Signed)
ANTICOAGULATION CONSULT NOTE - Follow Up Consult  Pharmacy Consult for Heparin Indication: arterial inclusion  No Known Allergies  Patient Measurements: Height: 5\' 8"  (172.7 cm) Weight: 68 kg (150 lb) IBW/kg (Calculated) : 68.4 Heparin Dosing Weight: 68 kg  Vital Signs: Temp: 98.7 F (37.1 C) (04/16 0822) Temp Source: Oral (04/16 0822) BP: 154/72 (04/16 0822) Pulse Rate: 74 (04/16 0822)  Labs: Recent Labs    08/27/20 0645 08/27/20 1653 08/28/20 0054 08/28/20 0302 08/28/20 0701  HGB 15.6  --   --  15.4  --   HCT 49.4  --   --  49.1  --   PLT 166  --   --  176  --   APTT  --  43*  --  64*  --   LABPROT 17.1*  --   --   --   --   INR 1.4*  --   --   --   --   HEPARINUNFRC  --  1.03*  --  0.90*  --   CREATININE 0.92  --   --  0.88  --   TROPONINIHS  --   --  3,785* 4,391* 2,822*    Estimated Creatinine Clearance: 59 mL/min (by C-G formula based on SCr of 0.88 mg/dL).   Assessment: [name] presents with L leg cold and mottled to touch. Pt on apixaban PTA for afib. Last dose 4/14 1700. Apixaban will be held and heparin gtt started for arterial occlusion. Apixaban will likely be affecting heparin levels so will utilize PTT for monitoring until levels correlate.   Heparin level supratherapeutic at 0.9 but falsely elevated from previous apixaban use. aPTT now therapeutic at 88 on heparin 1350 units/hr. No issues with IV infusion or access per RN. No noted bleeding.   Goal of Therapy:  Heparin level 0.3-0.7 units/ml; PTT 66-102 sec Monitor platelets by anticoagulation protocol: Yes   Plan:  Heparin 1350 units/hr Daily heparin level, aPTT, CBC  Romilda Garret, PharmD PGY1 Acute Care Pharmacy Resident 08/28/2020 11:13 AM  Please check [email or web address] for unit specific pharmacy phone numbers.

## 2020-08-28 NOTE — Progress Notes (Signed)
Pt is trasnfering to [facility]. Called and gave report to Jenean Lindau RN.   Pt's daughter, Ms. [name] May was called and update given. Prior transferring, Pt's  hemodynamically stable.   Kennyth Lose, RN

## 2020-08-28 NOTE — Progress Notes (Addendum)
   VASCULAR SURGERY ASSESSMENT & PLAN:   LLE ischemia; non-salvageable left lower extremity; no options for revascularization. Still refusing amputation. CTA c/a/p pending. Physical exam unchanged. VSS. Afberile.   History of atrial fibrillation maintained on Eliquis. This has been held and patient now on heparin infusion.  SUBJECTIVE:   Complaining of significant LLE and back pain.  PHYSICAL EXAM:   Vitals:   08/27/20 1920 08/27/20 2313 08/28/20 0000 08/28/20 0822  BP: (!) 143/86   (!) 154/72  Pulse:    74  Resp: 18 20  20   Temp: 97.8 F (36.6 C) 98.8 F (37.1 C)  98.7 F (37.1 C)  TempSrc: Oral Oral  Oral  SpO2: 96% 94% 94% 94%  Weight:      Height:       General appearance: Awake, alert in no apparent distress Cardiac: Heart rate and rhythm are regular Respirations: Nonlabored Extremities: LLE mottled to above knee. Left foot with pallor, cool to touch and insensate. Unable to dorsiflex.  LABS:   Lab Results  Component Value Date   WBC 10.1 08/28/2020   HGB 15.4 08/28/2020   HCT 49.1 08/28/2020   MCV 83.6 08/28/2020   PLT 176 08/28/2020   Lab Results  Component Value Date   CREATININE 0.88 08/28/2020   Lab Results  Component Value Date   INR 1.4 (H) 08/27/2020   CBG (last 3)  No results for input(s): GLUCAP in the last 72 hours.  PROBLEM LIST:    Principal Problem:   Arterial occlusion, lower extremity ([place]) Active Problems:   Atrial fibrillation ([place])   Hyperlipidemia   Essential hypertension   CURRENT MEDS:   . rosuvastatin  5 mg Oral Daily    Barbie Banner, [place] Office: [phone] 08/28/2020   I have independently interviewed and examined patient and agree with PA assessment and plan above.  CT angio ordered to evaluate for evidence of thrombus however likely due to atrial fibrillation.  Patient is on heparin.  He has [place] 3 acute limb ischemia with sensation beginning above the knee only has motor of the left hip.  He does have a  strong right lower extremity peroneal artery signal.  I have offered the patient left above-knee amputation which would need to be rather high and I have discussed this with his daughter at length yesterday.  At this time they are unwilling to proceed with above-knee amputation I think all that we have to offer is palliative consultation to discuss end-of-life goals of care.  Vascular surgery will be available as needed for amputation of the left lower extremity if patient becomes amenable.  Lakota Markgraf C. Donzetta Matters, MD [facility] Office: [phone] Pager: [phone]

## 2020-08-28 NOTE — Consult Note (Signed)
Cardiology Consultation:   Patient ID: Christian Maxwell MRN: [id number]; DOB: Apr 10, 1933  Admit date: 08/27/2020 Date of Consult: 08/28/2020  PCP:  Ria Bush, [facility]  Cardiologist:  Mertie Moores, MD  Advanced Practice Provider:  No care team member to display Electrophysiologist:  None    Patient Profile:   Christian Maxwell is a 87 y.o. male with a hx of chronic afib and admitted with acute left leg ishcemia who is being seen today for the evaluation of elevated troponin at the request of Dr Wynelle Cleveland.  History of Present Illness:   Christian Maxwell 87 yo male history of chronic afib, HL, PVCs, HTN, HL, admitted with leg pain. Found to have acute left leg arterial occlusion, started on hep gtt and evaluated by vascular. From vascular notes reported as non-salvageable and recommended left AKA   He reports eliquis compliance at home Denies any chest pain or SOB.    K 3.5 Cr 0.92 BUN 34 WBC 9.9 Hgb 15.6 Plt 166  COVID neg Trop 4274-->4391-->2822 CT PE no PE, aortic atherosclerosis Echo LVEF 55-60%, no WMAs, mild RV dysfunction, severe LAE EKG Afib, chronic LBBB Past Medical History:  Diagnosis Date  . Abscess 7/13   abd abscess  . Cancer [facility])    colon cancer  . Chronic atrial fibrillation ([place])   . History of colon cancer 2000   T3N0 s/p colectomy  . History of echocardiogram    a. Echo 2/13:  Mild LVH, EF 55-60%, mild MR, moderate LAE, mild to moderate RAE;  b. Echo 1/17: mild LVH, vigorous LVF, EF 65-70%, no RWMA, trivial AI, trivial MR, severe LAE, mild RAE, mod TR, PASP 32 mmHg  . History of stress test    a. Cardiolite in 9/04: EF 67%, no ischemia.;  b. Myoview 6/16: no ischemia   . Hx of adenomatous colonic polyps   . Hyperlipidemia   . Hypertension   . METHICILLIN RESISTANT STAPHYLOCOCCUS AUREUS INFECTION 12/12/2006   Annotation: stitch abcess in lower abdomen after colon cancer resection Qualifier: Diagnosis of  By: [name] MD,  [name]    . Myocardial infarction ([place]) 1985  . Postoperative stitch abscess 07/19/2011  . PVC's (premature ventricular contractions)   . Syncope and collapse    pt denies  . Thyroid disease    followed by [name]    Past Surgical History:  Procedure Laterality Date  . COLECTOMY  2000   6 inches removed  . COLONOSCOPY  2009   polyp, h/o cancer [name])  . EYE SURGERY     cataract surgery both eyes- pts ates he has never had cataract surgery 10-26-15  . HERNIA REPAIR  06/9796   DB umbilical hernia  . INCISE AND DRAIN ABCESS  2009,2010,2011,2012  . INCISION AND DRAINAGE Right 04/02/2013   Procedure: INCISION AND DRAINAGE RIGHT KNEE HEMATOMA;  Surgeon: [name], MD;  Location: [place];  Service: Orthopedics;  Laterality: Right;  . INCISION AND DRAINAGE ABSCESS N/A 09/20/2012   Procedure: INCISION AND DRAINAGE SUPRAPUBIC ABSCESS;  Surgeon: [name], MD;  Location: [place];  Service: General;  Laterality: N/A;  . INCISION AND DRAINAGE ABSCESS Left 02/28/2013   Procedure: INCISION AND DRAINAGE LEFT ARM ABSCESS;  Surgeon: [name], MD;  Location: [place];  Service: General;  Laterality: Left;  . LUMBAR LAMINECTOMY  1977  . MASS EXCISION  03/07/2012   EXCISION MASS;  Surgeon: [name], MD;  Laterality: Right;  removal mass posterior right arm  . POLYPECTOMY    . WOUND EXPLORATION  2006   For possible stitch abscess Dr [name]     Inpatient Medications: Scheduled Meds: . rosuvastatin  5 mg Oral Daily   Continuous Infusions: . heparin 1,350 Units/hr (08/28/20 3016)  . lactated ringers 75 mL/hr at 08/28/20 0642   PRN Meds: acetaminophen **OR** acetaminophen, morphine injection, ondansetron **OR** ondansetron (ZOFRAN) IV, oxyCODONE, polyethylene glycol, zolpidem  Allergies:   No Known Allergies  Social History:   Social History   Socioeconomic History  . Marital status: Widowed    Spouse name: Not on file  . Number of  children: Not on file  . Years of education: Not on file  . Highest education level: Not on file  Occupational History  . Not on file  Tobacco Use  . Smoking status: Former Smoker    Quit date: 10/13/1977    Years since quitting: 42.9  . Smokeless tobacco: Former Systems developer    Types: [place] date: 10/13/1977  Vaping Use  . Vaping Use: Never used  Substance and Sexual Activity  . Alcohol use: No    Alcohol/week: 0.0 standard drinks  . Drug use: No  . Sexual activity: Never  Other Topics Concern  . Not on file  Social History Narrative   Caffeine: none   Lives alone.  Grown daughters x2   Occupation: retired, had Human resources officer   Activity: take care of 3 houses   Diet: fruits/vegetables daily, good water      Cards: Ecologist   Endo: [place]   Surg: [name]   GI: [name]   Social Determinants of Health   Financial Resource Strain: Not on file  Food Insecurity: Not on file  Transportation Needs: Not on file  Physical Activity: Not on file  Stress: Not on file  Social Connections: Not on file  Intimate Partner Violence: Not on file    Family History:    Family History  Problem Relation Age of Onset  . Stroke Mother   . Stroke Father   . Hypertension Father   . CAD Father 28       MI  . Dementia Father   . Stroke Sister 42  . Stroke Brother   . Stroke Brother   . Diabetes Brother   . CAD Brother 98       MI  . Dementia Sister 56  . Dementia Brother   . Colon cancer Cousin        x4 total with colon cancer  . Cancer Neg Hx   . Colon polyps Neg Hx   . Rectal cancer Neg Hx   . Stomach cancer Neg Hx      ROS:  Please see the history of present illness.   All other ROS reviewed and negative.     Physical Exam/Data:   Vitals:   08/27/20 1920 08/27/20 2313 08/28/20 0000 08/28/20 0822  BP: (!) 143/86   (!) 154/72  Pulse:    74  Resp: 18 20  20   Temp: 97.8 F (36.6 C) 98.8 F (37.1 C)  98.7 F (37.1 C)  TempSrc: Oral Oral  Oral  SpO2: 96% 94% 94% 94%   Weight:      Height:        Intake/Output Summary (Last 24 hours) at 08/28/2020 1038 Last data filed at 08/28/2020 0109 Gross per 24 hour  Intake 1494.79 ml  Output --  Net 1494.79 ml   Last  3 Weights 08/27/2020 08/09/2020 07/22/2020  Weight (lbs) 150 lb 159 lb 3.2 oz 171 lb 15.3 oz  Weight (kg) 68.04 kg 72.213 kg 78 kg     Body mass index is 22.81 kg/m.  General:  Well nourished, well developed, in no acute distress HEENT: normal Lymph: no adenopathy Neck: no JVD Endocrine:  No thryomegaly Vascular: No carotid bruits; FA pulses 2+ bilaterally without bruits  Cardiac:  normal S1, S2; RRR; no murmur  Lungs:  clear to auscultation bilaterally, no wheezing, rhonchi or rales  Abd: soft, nontender, no hepatomegaly  Ext: left leg cold, bluish color Musculoskeletal:  No deformities, BUE and BLE strength normal and equal Skin: warm and dry  Neuro:  CNs 2-12 intact, no focal abnormalities noted Psych:  Normal affect    Laboratory Data:  High Sensitivity Troponin:   Recent Labs  Lab 08/28/20 0054 08/28/20 0302 08/28/20 0701  TROPONINIHS 4,274* 4,391* 2,822*     Chemistry Recent Labs  Lab 08/27/20 0645 08/28/20 0302  NA 142 145  K 3.5 3.7  CL 108 108  CO2 28 27  GLUCOSE 99 83  BUN 34* 31*  CREATININE 0.92 0.88  CALCIUM 10.0 9.7  GFRNONAA >60 >60  ANIONGAP 6 10    No results for input(s): PROT, ALBUMIN, AST, ALT, ALKPHOS, BILITOT in the last 168 hours. Hematology Recent Labs  Lab 08/27/20 0645 08/28/20 0302  WBC 9.9 10.1  RBC 5.90* 5.87*  HGB 15.6 15.4  HCT 49.4 49.1  MCV 83.7 83.6  MCH 26.4 26.2  MCHC 31.6 31.4  RDW 20.8* 20.8*  PLT 166 176   BNPNo results for input(s): BNP, PROBNP in the last 168 hours.  DDimer No results for input(s): DDIMER in the last 168 hours.   Radiology/Studies:  CT ANGIO CHEST PE W OR WO CONTRAST  Result Date: 08/27/2020 CLINICAL DATA:  Pt to ED for mottled, cold LLE; Pt has no palpable or Doppler pulses; No options for  re-vascularization per Vascular. Hx of A-Fib EXAM: CT ANGIOGRAPHY CHEST WITH CONTRAST TECHNIQUE: Multidetector CT imaging of the chest was performed using the standard protocol during bolus administration of intravenous contrast. Multiplanar CT image reconstructions and MIPs were obtained to evaluate the vascular anatomy. CONTRAST:  11mL OMNIPAQUE IOHEXOL 350 MG/ML SOLN COMPARISON:  07/07/2020 FINDINGS: Cardiovascular: Pulmonary arteries well opacified. Study mildly limited due to respiratory motion. Allowing for the motion limitation, there is no evidence of a pulmonary embolism. Heart top-normal in size. Mild left coronary artery calcifications. No pericardial effusion. Thoracic aorta not opacified. Aorta normal in caliber. Mild atherosclerotic calcifications. Mediastinum/Nodes: No enlarged mediastinal, hilar, or axillary lymph nodes. Thyroid gland, trachea, and esophagus demonstrate no significant findings. Lungs/Pleura: Mild dependent subsegmental atelectasis. No evidence of pneumonia or pulmonary edema. No mass or suspicious nodule. No pleural effusion or pneumothorax. Atelectasis improved from the prior study. Upper Abdomen: No acute findings. Musculoskeletal: Mild compression fracture of T8, with a fracture line visible cross the anterior lower endplate, associated with sclerosis, new since the prior CT. Mild to moderate chronic compression fracture of T4, unchanged. No other fractures. No bone lesions. Review of the MIP images confirms the above findings. IMPRESSION: 1. No evidence of a pulmonary embolism. Study mildly degraded by respiratory motion. 2. Mild compression fracture of T8 that is new when compared to the prior CT, but of unclear acuity. 3. No other evidence of an acute or recent abnormality on the chest CT. 4. Mild dependent atelectasis in the lungs improved compared to the prior CT.  No evidence of pneumonia or pulmonary edema. 5. Aortic atherosclerosis. Aortic Atherosclerosis (ICD10-I70.0).  Electronically Signed   By: Lajean Manes M.D.   On: 08/27/2020 12:29   ECHOCARDIOGRAM COMPLETE  Result Date: 08/27/2020    ECHOCARDIOGRAM REPORT   Patient Name:   GLORIA LAMBERTSON Zanella Date of Exam: 08/27/2020 Medical Rec #:  [id number]       Height:       68.0 in Accession #:    [id number]      Weight:       150.0 lb Date of Birth:  [birth year]       BSA:          1.809 m Patient Age:    89 years        BP:           154/112 mmHg Patient Gender: M               HR:           86 bpm. Exam Location:  Inpatient Procedure: 2D Echo, Cardiac Doppler and Color Doppler Indications:    Atrial fibrillation  History:        Patient has prior history of Echocardiogram examinations, most                 recent 01/21/2018. Previous Myocardial Infarction, Arrythmias:PVC                 and Atrial Fibrillation, Signs/Symptoms:Syncope; Risk                 Factors:Hypertension and Dyslipidemia.  Sonographer:    Luisa Hart RDCS Referring Phys: [id number] [place]  1. Left ventricular ejection fraction, by estimation, is 55 to 60%. The left ventricle has normal function. The left ventricle has no regional wall motion abnormalities. There is mild concentric left ventricular hypertrophy. Left ventricular diastolic function could not be evaluated.  2. Right ventricular systolic function is mildly reduced. The right ventricular size is normal. There is normal pulmonary artery systolic pressure.  3. Left atrial size was severely dilated.  4. The mitral valve is grossly normal. Trivial mitral valve regurgitation.  5. The aortic valve is tricuspid. There is mild calcification of the aortic valve. There is mild thickening of the aortic valve. Aortic valve regurgitation is mild. Mild aortic valve sclerosis is present, with no evidence of aortic valve stenosis.  6. Aortic dilatation noted. There is borderline dilatation of the ascending aorta, measuring 38 mm.  7. The inferior vena cava is normal in size with greater than  50% respiratory variability, suggesting right atrial pressure of 3 mmHg. Comparison(s): No significant change from prior study. Conclusion(s)/Recommendation(s): Otherwise normal echocardiogram, with minor abnormalities described in the report. FINDINGS  Left Ventricle: Left ventricular ejection fraction, by estimation, is 55 to 60%. The left ventricle has normal function. The left ventricle has no regional wall motion abnormalities. The left ventricular internal cavity size was small. There is mild concentric left ventricular hypertrophy. Abnormal (paradoxical) septal motion, consistent with left bundle Ricky Doan block. Left ventricular diastolic function could not be evaluated due to atrial fibrillation. Left ventricular diastolic function could not be evaluated. Right Ventricle: The right ventricular size is normal. Right vetricular wall thickness was not well visualized. Right ventricular systolic function is mildly reduced. There is normal pulmonary artery systolic pressure. The tricuspid regurgitant velocity is 2.44 m/s, and with an assumed right atrial pressure of 3 mmHg, the estimated right ventricular systolic pressure is 25.9 mmHg. Left  Atrium: Left atrial size was severely dilated. Right Atrium: Right atrial size was normal in size. Pericardium: There is no evidence of pericardial effusion. Mitral Valve: The mitral valve is grossly normal. Trivial mitral valve regurgitation. Tricuspid Valve: The tricuspid valve is normal in structure. Tricuspid valve regurgitation is mild. Aortic Valve: The aortic valve is tricuspid. There is mild calcification of the aortic valve. There is mild thickening of the aortic valve. Aortic valve regurgitation is mild. Aortic regurgitation PHT measures 636 msec. Mild aortic valve sclerosis is present, with no evidence of aortic valve stenosis. Aortic valve mean gradient measures 2.0 mmHg. Aortic valve peak gradient measures 3.6 mmHg. Aortic valve area, by VTI measures 2.46 cm.  Pulmonic Valve: The pulmonic valve was not well visualized. Pulmonic valve regurgitation is not visualized. No evidence of pulmonic stenosis. Aorta: Aortic dilatation noted. There is borderline dilatation of the ascending aorta, measuring 38 mm. Venous: The inferior vena cava is normal in size with greater than 50% respiratory variability, suggesting right atrial pressure of 3 mmHg. IAS/Shunts: The atrial septum is grossly normal.  LEFT VENTRICLE PLAX 2D LVIDd:         5.10 cm LVIDs:         2.80 cm LV PW:         1.20 cm LV IVS:        1.00 cm LVOT diam:     1.90 cm LV SV:         37 LV SV Index:   20 LVOT Area:     2.84 cm  LV Volumes (MOD) LV vol d, MOD A2C: 28.9 ml LV vol d, MOD A4C: 39.6 ml LV vol s, MOD A2C: 17.8 ml LV vol s, MOD A4C: 26.5 ml LV SV MOD A2C:     11.1 ml LV SV MOD A4C:     39.6 ml LV SV MOD BP:      12.2 ml RIGHT VENTRICLE RV S prime:     5.70 cm/s TAPSE (M-mode): 1.3 cm LEFT ATRIUM              Index       RIGHT ATRIUM           Index LA Vol (A2C):   34.6 ml  19.13 ml/m RA Area:     11.50 cm LA Vol (A4C):   108.0 ml 59.72 ml/m RA Volume:   17.40 ml  9.62 ml/m LA Biplane Vol: 61.5 ml  34.01 ml/m  AORTIC VALVE                   PULMONIC VALVE AV Area (Vmax):    2.41 cm    PV Vmax:       1.00 m/s AV Area (Vmean):   2.58 cm    PV Vmean:      72.000 cm/s AV Area (VTI):     2.46 cm    PV VTI:        0.162 m AV Vmax:           94.90 cm/s  PV Peak grad:  4.0 mmHg AV Vmean:          61.800 cm/s PV Mean grad:  2.5 mmHg AV VTI:            0.150 m AV Peak Grad:      3.6 mmHg AV Mean Grad:      2.0 mmHg LVOT Vmax:         80.80 cm/s LVOT Vmean:  56.200 cm/s LVOT VTI:          0.130 m LVOT/AV VTI ratio: 0.87 AI PHT:            636 msec  AORTA Ao Root diam: 3.40 cm Ao Asc diam:  3.80 cm MITRAL VALVE               TRICUSPID VALVE MV Area (PHT): 3.77 cm    TR Peak grad:   23.8 mmHg MV Decel Time: 201 msec    TR Vmax:        244.00 cm/s MV E velocity: 67.80 cm/s                            SHUNTS                             Systemic VTI:  0.13 m                            Systemic Diam: 1.90 cm Buford Dresser MD Electronically signed by Buford Dresser MD Signature Date/Time: 08/27/2020/1:18:20 PM    Final      Assessment and Plan:   1. NSTEMI - peak trop 4391, trending down, EKG chronic LBBB -Echo LVEF 55-60%, no WMAs, mild RV dysfunction, severe LAE   - patient presented with acute left limb ishcemia deemed to be nonsalvageable, he has iniitally turned down AKA - he has not had any chest pain or SOB.  - given presentation of NSTEMI and acute limb ischemia and chronic afib consider possible embolic event, though patient reports compliance with eliquis. Echo without evidence of thrombus, CTA is pending.   - difficult management. He has had no cardiopulmonary symptoms and has stable echo findings. If has cath and started on DAPT would prohibit any leg surgery for his nonsalvageable ishcemic leg. If final decision is to refuse leg surgery and has cath and started on DAPT likely to succomb to complications from untreated ischemic limb in the near term. Either scenario do not see how cath is overall beneficial.  - continue medical therapy with hep gtt, start ASA whle off eliquis. Continue staitn. Lopressor on hold due to low HRs. Can start ARB in setting of ACS.  - agree with palliative care consult to address goals of care.    2. Acute left leg ischemia - from vascular notes nonsalvageable, recs for AKA - patient refused procedure, ongoing discussions with vascular, primary team and potentially palliative consultation if he continues to refuse procedure.    For questions or updates, please contact [place] Please consult [email or web address] for contact info under    Signed, Carlyle Dolly, MD  08/28/2020 10:38 AM

## 2020-08-28 NOTE — Progress Notes (Signed)
ANTICOAGULATION CONSULT NOTE - Follow Up Consult  Pharmacy Consult for Heparin Indication: arterial inclusion  No Known Allergies  Patient Measurements: Height: 5\' 8"  (172.7 cm) Weight: 68 kg (150 lb) IBW/kg (Calculated) : 68.4 Heparin Dosing Weight: 68 kg  Vital Signs: Temp: 98.8 F (37.1 C) (04/15 2313) Temp Source: Oral (04/15 2313) BP: 143/86 (04/15 1920)  Labs: Recent Labs    08/27/20 0645 08/27/20 1653 08/28/20 0054 08/28/20 0302  HGB 15.6  --   --  15.4  HCT 49.4  --   --  49.1  PLT 166  --   --  176  APTT  --  43*  --  64*  LABPROT 17.1*  --   --   --   INR 1.4*  --   --   --   HEPARINUNFRC  --  1.03*  --  0.90*  CREATININE 0.92  --   --  0.88  TROPONINIHS  --   --  4,274* 4,391*    Estimated Creatinine Clearance: 59 mL/min (by C-G formula based on SCr of 0.88 mg/dL).   Assessment: [name] presents with L leg cold and mottled to touch. Pt on apixaban PTA for afib. Last dose 4/14 1700. Apixaban will be held and heparin gtt started for arterial occlusion. Apixaban will likely be affecting heparin levels so will utilize PTT for monitoring until levels correlate.   Heparin level 0.9 - falsely elevated from previous apixaban use. aPTT 64 (slightly subtherapeutic) on heparin 1000 units/hr. No issues with IV infusion or access per RN. No noted bleeding.   Goal of Therapy:  Heparin level 0.3-0.7 units/ml; PTT 66-102 sec Monitor platelets by anticoagulation protocol: Yes   Plan:  Increase heparin to 1350 units/hr Check 8 hr PTT  Sherlon Handing, PharmD, BCPS Please see amion for complete clinical pharmacist phone list 08/28/2020,4:51 AM

## 2020-08-28 NOTE — Consult Note (Signed)
Consultation Note Date: 08/28/2020   Patient Name: Christian Maxwell  DOB: 09/02/1932  MRN: [id number]  Age / Sex: 87 y.o., male  PCP: Ria Bush, MD Referring Physician: Debbe Odea, MD  Reason for Consultation: Establishing goals of care  HPI/Patient Profile: 87 y.o. male  with past medical history of a fib on eliquis, colon cancer, hypertension,CVA, hyperlipidemia & dementia admitted on 08/27/2020 with lower extremity arterial occlusion. AKA was recommended by vascular surgery however patient declined surgery.  Patient also with NSTEMI. PMT consulted for [place].   Clinical Assessment and Goals of Care: I have reviewed medical records including EPIC notes, labs and imaging, received report from RN and DR Wynelle Cleveland, assessed the patient and then met with 2 daughters and patient  to discuss diagnosis prognosis, GOC, EOL wishes, disposition and options.  Upon assessment, patient left lower extremity is cold and mottled. Great toe dark purple/black. Patient reports pain in extremity. Requesting pain medication.   I introduced Palliative Medicine as specialized medical care for people living with serious illness. It focuses on providing relief from the symptoms and stress of a serious illness. The goal is to improve quality of life for both the patient and the family.  As far as functional and nutritional status, daughters tell me patient had been living at home with caregivers in the home most of the day. They tell me he has really declined since a fall about 2 months ago. They tell me of increasing confusion.    We discussed patient's current illness and what it means in the larger context of patient's on-going co-morbidities.  Natural disease trajectory and expectations at EOL were discussed.  I attempted to elicit values and goals of care important to the patient.    The difference between aggressive medical intervention and comfort care was  considered in light of the patient's goals of care. We discuss amputation of leg which would result in SNF stay, long recovery period. Daughters share patient would definitely not want this. We discuss not proceeding with amputation which result with death soon - likely days to a week. We discuss promoting comfort during this time. We discuss a transfer to hospice facility for aggressive symptom management.   Daughters discussed this between themselves and with patient and eventually decided that they do not want to proceed with surgery. They agree to full comfort measures and transfer to hospice facility. They request continuous infusion of pain medication as they want to eliminate suffering. We discuss code status change to DNR and they agree. We discuss dc heparin drip so he does not have to continue with lab work. They agree to all - comfort measures only.   Discussed with patient/family the importance of continued conversation with family and the medical providers regarding overall plan of care and treatment options, ensuring decisions are within the context of the patient's values and GOCs.    Discuss philosophy of hospice care and care provided at hospice facility. Connected family with hospice liaison - they specifically requested [facility].   Questions and concerns were addressed. The family was encouraged to call with questions or concerns.   Primary Decision Maker NEXT OF KIN - 2 adult daughters [name] and Tammy   SUMMARY OF RECOMMENDATIONS   - comfort measures only - dc all measures that do not promote comfort, will dc heparin infusion though it may provide some symptom relief family does not want to continue with lab draws and we can use medication for pain - morphine infusion with  boluses PRN, PRNs for agitation, anxiety, and excessive secretions ordered - hospice facility when bed available - have requested bed on [place] - DNR signed and placed on chart  Code Status/Advance  Care Planning:  DNR  Additional Recommendations (Limitations, Scope, Preferences):  Full Comfort Care  Prognosis:   < 2 weeks  Discharge Planning: Hospice facility      Primary Diagnoses: Present on Admission: . Atrial fibrillation ([place]) . (Resolved) Benign essential HTN . Essential hypertension . Hyperlipidemia . (Resolved) Arterial occlusion . Arterial occlusion, lower extremity ([place])   I have reviewed the medical record, interviewed the patient and family, and examined the patient. The following aspects are pertinent.  Past Medical History:  Diagnosis Date  . Abscess 7/13   abd abscess  . Cancer [facility])    colon cancer  . Chronic atrial fibrillation ([place])   . History of colon cancer 2000   T3N0 s/p colectomy  . History of echocardiogram    a. Echo 2/13:  Mild LVH, EF 55-60%, mild MR, moderate LAE, mild to moderate RAE;  b. Echo 1/17: mild LVH, vigorous LVF, EF 65-70%, no RWMA, trivial AI, trivial MR, severe LAE, mild RAE, mod TR, PASP 32 mmHg  . History of stress test    a. Cardiolite in 9/04: EF 67%, no ischemia.;  b. Myoview 6/16: no ischemia   . Hx of adenomatous colonic polyps   . Hyperlipidemia   . Hypertension   . METHICILLIN RESISTANT STAPHYLOCOCCUS AUREUS INFECTION 12/12/2006   Annotation: stitch abcess in lower abdomen after colon cancer resection Qualifier: Diagnosis of  By: [name] MD, [name]    . Myocardial infarction ([place]) 1985  . Postoperative stitch abscess 07/19/2011  . PVC's (premature ventricular contractions)   . Syncope and collapse    pt denies  . Thyroid disease    followed by [name]   Social History   Socioeconomic History  . Marital status: Widowed    Spouse name: Not on file  . Number of children: Not on file  . Years of education: Not on file  . Highest education level: Not on file  Occupational History  . Not on file  Tobacco Use  . Smoking status: Former Smoker    Quit date: 10/13/1977    Years since quitting: 42.9  .  Smokeless tobacco: Former Systems developer    Types: [place] date: 10/13/1977  Vaping Use  . Vaping Use: Never used  Substance and Sexual Activity  . Alcohol use: No    Alcohol/week: 0.0 standard drinks  . Drug use: No  . Sexual activity: Never  Other Topics Concern  . Not on file  Social History Narrative   Caffeine: none   Lives alone.  Grown daughters x2   Occupation: retired, had Human resources officer   Activity: take care of 3 houses   Diet: fruits/vegetables daily, good water      Cards: Ecologist   Endo: [place]   Surg: [name]   GI: [name]   Social Determinants of Health   Financial Resource Strain: Not on file  Food Insecurity: Not on file  Transportation Needs: Not on file  Physical Activity: Not on file  Stress: Not on file  Social Connections: Not on file   Family History  Problem Relation Age of Onset  . Stroke Mother   . Stroke Father   . Hypertension Father   . CAD Father 30       MI  . Dementia Father   .  Stroke Sister 63  . Stroke Brother   . Stroke Brother   . Diabetes Brother   . CAD Brother 63       MI  . Dementia Sister 21  . Dementia Brother   . Colon cancer Cousin        x4 total with colon cancer  . Cancer Neg Hx   . Colon polyps Neg Hx   . Rectal cancer Neg Hx   . Stomach cancer Neg Hx    Scheduled Meds: Continuous Infusions: . heparin 1,350 Units/hr (08/28/20 2951)  . morphine     PRN Meds:.acetaminophen **OR** acetaminophen, antiseptic oral rinse, glycopyrrolate **OR** glycopyrrolate **OR** glycopyrrolate, haloperidol **OR** haloperidol **OR** haloperidol lactate, morphine, ondansetron **OR** ondansetron (ZOFRAN) IV, polyethylene glycol, polyvinyl alcohol, zolpidem No Known Allergies Review of Systems  Musculoskeletal:       Pain in back and left lower extremity    Physical Exam Pulmonary:     Effort: Pulmonary effort is normal.  Musculoskeletal:     Comments: Left lower leg cold to touch, majority of leg purple, great toe dark  purple/black  Neurological:     Mental Status: He is alert and oriented to person, place, and time.     Vital Signs: BP (!) 145/71   Pulse 77   Temp 98.7 F (37.1 C) (Oral)   Resp 20   Ht '5\' 8"'  (1.727 m)   Wt 68 kg   SpO2 94%   BMI 22.81 kg/m  Pain Scale: 0-10 POSS *See Group Information*: 1-Acceptable,Awake and alert Pain Score: 10-Worst pain ever   SpO2: SpO2: 94 % O2 Device:SpO2: 94 % O2 Flow Rate: .   IO: Intake/output summary:   Intake/Output Summary (Last 24 hours) at 08/28/2020 1601 Last data filed at 08/28/2020 8841 Gross per 24 hour  Intake 1494.79 ml  Output --  Net 1494.79 ml    LBM: Last BM Date: 08/27/20 Baseline Weight: Weight: 68 kg Most recent weight: Weight: 68 kg     Palliative Assessment/Data: PPS 30%    Time Total: 120 minutes Greater than 50%  of this time was spent counseling and coordinating care related to the above assessment and plan.  Juel Burrow, DNP, AGNP-C Palliative Medicine Team [phone] Pager: [phone]

## 2020-08-28 NOTE — Progress Notes (Signed)
Engineer, maintenance [facility]) Hospital Liaison note.   Received request from [place] for family interest in [facility]. [facility] is unable to offer a room today.  Hospital Liaison will follow up tomorrow or sooner if a room becomes available. Spoke to both daughters who are aware no beds until possibly Monday or Tuesday. Patient has been given days to weeks.   Please do not hesitate to call with questions.   Thank you,  Clementeen Hoof, BSN, RN [place] (listed on AMION under Hospice and [place] of [phone]   [phone]

## 2020-08-29 DIAGNOSIS — I70209 Unspecified atherosclerosis of native arteries of extremities, unspecified extremity: Secondary | ICD-10-CM

## 2020-08-29 MED ORDER — POLYVINYL ALCOHOL 1.4 % OP SOLN
1.0000 [drp] | Freq: Four times a day (QID) | OPHTHALMIC | Status: DC
  Administered 2020-08-29 – 2020-08-30 (×4): 1 [drp] via OPHTHALMIC
  Filled 2020-08-29: qty 15

## 2020-08-29 NOTE — Progress Notes (Signed)
Palliative:  HPI: 87 y.o. male  with past medical history of a fib on eliquis, colon cancer, hypertension,CVA,hyperlipidemia& dementia admitted on 08/27/2020 with lower extremity arterial occlusion. AKA was recommended by vascular surgery however patient declined surgery.  Patient also with NSTEMI. PMT consulted for [place].  I met today at Christian Maxwell' bedside with 2 daughters and caregiver present. He is resting comfortably on morphine infusion. He denies pain or discomfort. He is able to speak and interact but restful. No beds available for [facility] today. No changes to plan of care or medication regimen. Reviewed medications for comfort with family. They are hopeful for transition to [facility].   All questions/concerns addressed. Emotional support provided.   Exam: Resting comfortably. Able to awaken and interact verbally. Breathing regular, unlabored. Abd flat. Denies discomfort.   Plan: - Continue with comfort care.  - Hopeful for transition to [facility] in the coming days.   15 min  Christian Sill, NP Palliative Medicine Team Pager [phone] (Please see [email or web address] for schedule) Team Phone [phone]    Greater than 50%  of this time was spent counseling and coordinating care related to the above assessment and plan

## 2020-08-29 NOTE — Progress Notes (Signed)
Manufacturing engineer [facility]) Hospital Liaison note.    Chart and pt information have been reviewed by [facility] physician.  [facility] eligibility confirmed.  [facility] is unable to offer a room today. Hospital Liaison will follow up tomorrow or sooner if a room becomes available. Please do not hesitate to call with questions.    Thank you for the opportunity to participate in this patient's care.  Domenic Moras, BSN, RN [facility] Liaison (listed on [place] under Hospice/[facility])    [phone] (24h on call)

## 2020-08-29 NOTE — Progress Notes (Signed)
Chart reviewed including primary team and palliative care notes. Patient has transitioned to comfort care, cardiology team will no long follow as inpatient       Signed, Carlyle Dolly, MD  08/29/2020, 10:54 AM

## 2020-08-29 NOTE — Progress Notes (Signed)
PROGRESS NOTE    [name]   GEX:[id number]  DOB: 10-13-1932  DOA: 08/27/2020 PCP: Ria Bush, MD   Brief Narrative:  Christian Maxwell  is a 87 y.o. male with medical history significant of atrial fibrillation on Eliquis, colon cancer, hypertension,CVA, hyperlipidemia & dementia. The patient has had lower left leg pain for the past month.  It is worsened over the last 2 weeks.  No specific injury or change in activity.  It is turned a purplish color as well.  He cannot feel any sensation outside of pain in the leg and foot.  He reports he has been compliant with his Eliquis.  He has caretakers at home and for about 1 wk and they have been giving him his medications. His daughter Christian Maxwell does say that he has dementia.  Subjective: Asleep-     Assessment & Plan:   Principal Problem:   Arterial occlusion, lower extremity - AKA recommended by vascular surgery but declined by patient - Vascular surgery has signed offI met with the daughters and met again with the patient. I spoke for an extended period of time with his daughters and palliative care prior to speaking with the patient again.   - The daughters did tell me that he likely has not taken his medications (including DOAC) since 3/30.  - The patient and daughters understand and have opted for comfort care. Palliative care has transitioned him to full comfort. I appreciate palliative care's assistance. Will go to hospice home when bed is available. - on Morphine infusion per palliative  Active Problems:  NSTEMI - incidental finding on EKG- inferior leads revealed  T wave inversions -  he did not have any chest pain - Trop max 4,391     Atrial fibrillation - he states he was compliant with Eliquis   - 2 D ECHO does not reveal a thrombus - CTA did not reveal a thrombus    Mild compression fracture of T8  - noted on CTA - cannot tell if this is acute or chronic - I am unable to turn him to evaluate his back     Dementia - per daughter he has short term forgetfulness    Hyperlipidemia - cont Crestor    Essential hypertension - cont Cozaar  Time spent in minutes:20 DVT prophylaxis: none Code Status: DNR Family Communication:  Both daughters 4/16 Level of Care: Level of care: Palliative Care Disposition Plan:  Status is: Inpatient  Remains inpatient appropriate because:IV treatments appropriate due to intensity of illness or inability to take PO   Dispo: The patient is from: Home              Anticipated d/c is to: Hospice home              Patient currently is not medically stable to d/c.   Difficult to place patient No   Consultants:   Vascular surgery  Cardiology  Palliative  Procedures:   2 d ECHO Antimicrobials:  Anti-infectives (From admission, onward)   None       Objective: Vitals:   08/28/20 2000 08/28/20 2200 08/28/20 2305 08/28/20 2323  BP: (!) 152/60  131/65 127/76  Pulse: 70 78 78 75  Resp: 20 (!) _0 Temp: 97.8 F (36.6 C)   97.8 F (36.6 C)  TempSrc: Oral     SpO2: 98% 93% 92% 90%  Weight:      Height:        Intake/Output Summary (  Last 24 hours) at 08/29/2020 1359 Last data filed at 08/28/2020 2000 Gross per 24 hour  Intake 374.59 ml  Output --  Net 374.59 ml   Filed Weights   08/27/20 0551  Weight: 68 kg    Examination: Asleep and appears comfortable    Data Reviewed: I have personally reviewed following labs and imaging studies  CBC: Recent Labs  Lab 08/27/20 0645 08/28/20 0302  WBC 9.9 10.1  NEUTROABS 7.8*  --   HGB 15.6 15.4  HCT 49.4 49.1  MCV 83.7 83.6  PLT 166 154   Basic Metabolic Panel: Recent Labs  Lab 08/27/20 0645 08/28/20 0302  NA 142 145  K 3.5 3.7  CL 108 108  CO2 28 27  GLUCOSE 99 83  BUN 34* 31*  CREATININE 0.92 0.88  CALCIUM 10.0 9.7   GFR: Estimated Creatinine Clearance: 59 mL/min (by C-G formula based on SCr of 0.88 mg/dL). Liver Function Tests: No results for input(s): AST,  ALT, ALKPHOS, BILITOT, PROT, ALBUMIN in the last 168 hours. No results for input(s): LIPASE, AMYLASE in the last 168 hours. No results for input(s): AMMONIA in the last 168 hours. Coagulation Profile: Recent Labs  Lab 08/27/20 0645  INR 1.4*   Cardiac Enzymes: No results for input(s): CKTOTAL, CKMB, CKMBINDEX, TROPONINI in the last 168 hours. BNP (last 3 results) Recent Labs    07/09/20 1220  PROBNP 747*   HbA1C: No results for input(s): HGBA1C in the last 72 hours. CBG: No results for input(s): GLUCAP in the last 168 hours. Lipid Profile: No results for input(s): CHOL, HDL, LDLCALC, TRIG, CHOLHDL, LDLDIRECT in the last 72 hours. Thyroid Function Tests: No results for input(s): TSH, T4TOTAL, FREET4, T3FREE, THYROIDAB in the last 72 hours. Anemia Panel: No results for input(s): VITAMINB12, FOLATE, FERRITIN, TIBC, IRON, RETICCTPCT in the last 72 hours. Urine analysis:    Component Value Date/Time   COLORURINE YELLOW 07/22/2020 0602   APPEARANCEUR CLEAR 07/22/2020 0602   LABSPEC 1.019 07/22/2020 0602   PHURINE 5.0 07/22/2020 0602   GLUCOSEU NEGATIVE 07/22/2020 0602   HGBUR NEGATIVE 07/22/2020 0602   BILIRUBINUR NEGATIVE 07/22/2020 0602   KETONESUR NEGATIVE 07/22/2020 0602   PROTEINUR NEGATIVE 07/22/2020 0602   UROBILINOGEN 0.2 07/17/2012 0950   NITRITE NEGATIVE 07/22/2020 0602   LEUKOCYTESUR NEGATIVE 07/22/2020 0602   Sepsis Labs: _0 (procalcitonin:4,lacticidven:4) ) Recent Results (from the past 240 hour(s))  Resp Panel by RT-PCR (Flu A&B, Covid) Nasopharyngeal Swab     Status: None   Collection Time: 08/27/20  6:59 AM   Specimen: Nasopharyngeal Swab; Nasopharyngeal(NP) swabs in vial transport medium  Result Value Ref Range Status   SARS Coronavirus 2 by RT PCR NEGATIVE NEGATIVE Final    Comment: (NOTE) SARS-CoV-2 target nucleic acids are NOT DETECTED.  The SARS-CoV-2 RNA is generally detectable in upper respiratory specimens during the acute phase of  infection. The lowest concentration of SARS-CoV-2 viral copies this assay can detect is 138 copies/mL. A negative result does not preclude SARS-Cov-2 infection and should not be used as the sole basis for treatment or other patient management decisions. A negative result may occur with  improper specimen collection/handling, submission of specimen other than nasopharyngeal swab, presence of viral mutation(s) within the areas targeted by this assay, and inadequate number of viral copies(<138 copies/mL). A negative result must be combined with clinical observations, patient history, and epidemiological information. The expected result is Negative.  Fact Sheet for Patients:  EntrepreneurPulse.com.au  Fact Sheet for Healthcare Providers:  IncredibleEmployment.be  This  test is no t yet approved or cleared by the [place] and  has been authorized for detection and/or diagnosis of SARS-CoV-2 by FDA under an Emergency Use Authorization (EUA). This EUA will remain  in effect (meaning this test can be used) for the duration of the COVID-19 declaration under Section 564(b)(1) of the Act, 21 U.S.C.section 360bbb-3(b)(1), unless the authorization is terminated  or revoked sooner.       Influenza A by PCR NEGATIVE NEGATIVE Final   Influenza B by PCR NEGATIVE NEGATIVE Final    Comment: (NOTE) The Xpert Xpress SARS-CoV-2/FLU/RSV plus assay is intended as an aid in the diagnosis of influenza from Nasopharyngeal swab specimens and should not be used as a sole basis for treatment. Nasal washings and aspirates are unacceptable for Xpert Xpress SARS-CoV-2/FLU/RSV testing.  Fact Sheet for Patients: EntrepreneurPulse.com.au  Fact Sheet for Healthcare Providers: IncredibleEmployment.be  This test is not yet approved or cleared by the [place] FDA and has been authorized for detection and/or diagnosis of SARS-CoV-2  by FDA under an Emergency Use Authorization (EUA). This EUA will remain in effect (meaning this test can be used) for the duration of the COVID-19 declaration under Section 564(b)(1) of the Act, 21 U.S.C. section 360bbb-3(b)(1), unless the authorization is terminated or revoked.  Performed at [facility], [place] [address] [place], [place] [address]          Radiology Studies: No results found.    Scheduled Meds: . polyvinyl alcohol  1 drop Both Eyes QID   Continuous Infusions: . morphine 2 mg/hr (08/28/20 2000)     LOS: 2 days      Debbe Odea, MD [facility] Pager: [email or web address] 08/29/2020, 1:59 PM

## 2020-08-30 MED ORDER — MORPHINE BOLUS VIA INFUSION
4.0000 mg | INTRAVENOUS | Status: DC | PRN
  Filled 2020-08-30: qty 4

## 2020-08-30 MED ORDER — MORPHINE BOLUS VIA INFUSION: 2.0000 mg | INTRAVENOUS | 0 refills | Status: DC | PRN

## 2020-08-30 MED ORDER — MORPHINE 100MG IN NS 100ML (1MG/ML) PREMIX INFUSION: 2.0000 mg/h | INTRAVENOUS | 0 refills | Status: DC

## 2020-08-30 MED ORDER — HYDROMORPHONE HCL 1 MG/ML IJ SOLN
2.0000 mg | Freq: Once | INTRAMUSCULAR | Status: AC
  Administered 2020-08-30: 2 mg via INTRAVENOUS
  Filled 2020-08-30: qty 2

## 2020-08-30 MED ORDER — LORAZEPAM 2 MG/ML IJ SOLN
1.0000 mg | INTRAMUSCULAR | Status: DC | PRN
  Administered 2020-08-30 (×2): 1 mg via INTRAVENOUS
  Filled 2020-08-30 (×2): qty 1

## 2020-08-30 MED ORDER — ZOLPIDEM TARTRATE 5 MG PO TABS: 5.0000 mg | ORAL_TABLET | Freq: Every evening | ORAL | 0 refills | Status: DC | PRN

## 2020-08-30 NOTE — Progress Notes (Signed)
Transport came to pick up patient. 20cc of Morphine wasted with another nurse ([place],RN). and Dilaudid 2mg  given per MD order before transport.

## 2020-08-30 NOTE — Progress Notes (Signed)
Palliative:  HPI: 87 y.o.malewith past medical history of a fib on eliquis,colon cancer, hypertension,CVA,hyperlipidemia& dementiaadmitted on 4/15/2022with lower extremity arterial occlusion.AKA was recommended by vascular surgery however patient declined surgery. Patient also with NSTEMI. PMT consulted for [place].  I met again today with both daughters. They have many questions regarding his foot/leg and how this has progressed. We reviewed images in electronic record compared to today and then discussed this increase in his pain and discomfort are all signs of progression as expected. They remain very concerned that he is having increased pain and they do not want him to have any pain and desire aggressive management to ensure his comfort. We discussed adjustment to morphine infusion as needed and plan for dilaudid prior to transfer to hospice. We discussed that in hospice care should be more consistently pointed towards pain relief and comfort and I am hoping this will be a better environment for them all. Of note, they also have some issues with his finances causing additional stress to family.   All questions/concerns addressed. Emotional support provided.   Exam: More sleepy today but does arouse and follow commands. Increased discomfort in leg - difficult to obtain good descriptors of pain. Daughters express when he asks for something "for sleep" this is a good indication he is having pain. Abd soft.   Plan: - Full comfort care.  - Transition to hospice facility.   25 min  Vinie Sill, NP Palliative Medicine Team Pager [phone] (Please see [email or web address] for schedule) Team Phone [phone]    Greater than 50%  of this time was spent counseling and coordinating care related to the above assessment and plan

## 2020-08-30 NOTE — Progress Notes (Signed)
[facility] ([facility])  Chart and pt information have been reviewed by [facility] physician.  [facility] eligibility confirmed.  Spoke to [place] the patients daughtery to confirm interest and explain services. Family agreeable to transfer today. CSW aware.    Registration paperwork completed will be completed by daughter. I will notify the TOC once completed. Dr. Orpah Melter to assume care per family request.    RN please call report to [phone].  Please arrange transport for patient once consents complete.   Thank you,   Clementeen Hoof, BSN, RN    [place] (listed on AMION under Hospice and [place] of [phone]   [phone]

## 2020-08-30 NOTE — TOC Transition Note (Signed)
Transition of Care [facility]) - CM/SW Discharge Note   Patient Details  Name: Christian Maxwell MRN: [id number] Date of Birth: [birth year]  [facility]) CM/SW Contact:  Emeterio Reeve, [place] Phone Number: 08/30/2020, 3:22 PM   Clinical Narrative:     Patient will DC to: [facility] Anticipated DC date: 08/30/20 Family notified: both daughters [name] and Tammie Transport by: Corey Harold     Per MD patient ready for DC to [facility]. RN, patient, patient's family, and facility notified of DC. DC packet on chart. Ambulance transport requested for patient.    RN to call report to [phone].  CSW will sign off for now as social work intervention is no longer needed. Please consult [place] again if new needs arise.   Final next level of care: Other (comment) (REsidential hospice) Barriers to Discharge: No Barriers Identified   Patient Goals and CMS Choice        Discharge Placement                Patient to be transferred to facility by: [facility] Name of family member notified: Daughters, [name] and Tammie Patient and family notified of of transfer: 08/30/20  Discharge Plan and Services                                     Social Determinants of Health (SDOH) Interventions     Readmission Risk Interventions No flowsheet data found.  Emeterio Reeve, Latanya Presser, [place] Social Worker [phone]

## 2020-08-30 NOTE — Discharge Summary (Signed)
Physician Discharge Summary  RAAHIM SHARTZER QQP:[id number] DOB: 1932/07/06 DOA: 08/27/2020  PCP: Ria Bush, MD  Admit date: 08/27/2020 Discharge date: 08/30/2020  Admitted From: home Disposition:  [place]   Recommendations for Outpatient Follow-up:  1. On Morphine infusion for pain in back and left foot    Discharge Condition:  gaurded   CODE STATUS:  DNR   Diet recommendation:  Regular diet Consultations:  Vascular surgery  Cardiology  Palliative care  Procedures/Studies: . 2 D ECHO   Discharge Diagnoses:  Principal Problem:   Arterial occlusion, lower extremity ([place]) Active Problems:   Atrial fibrillation ([place])   Hyperlipidemia   Essential hypertension    Brief Narrative:  BENJAMINE Maxwell is a 87 y.o.malewith medical history significant ofatrial fibrillation on Eliquis, colon cancer, hypertension,CVA, hyperlipidemia & dementia. The patient has had lower left leg pain for the past month. It is worsened over the last 2 weeks. No specific injury or change in activity. It is turned a purplish color as well.He reports he has been compliant with his Eliquis.  Further history obtained from his daughters. He has caretakers at home and for about 1- 2 wks they have been giving him his medications. Prior to this, his daughters feel, he was not taking his medications appropriately.  Subjective: Alert. Asking for food. Has pain in lower back and in left foot.     Assessment & Plan:   Principal Problem:   Arterial occlusion, lower extremity - AKA recommended by vascular surgery but declined by patient - ? embolic  - 2 D ECHO does not reveal a thrombus - CTA did not reveal a thrombus - Vascular surgery has signed off - The patient and daughters understand the gravity of the situation in regards to acute ischemia of the left leg and fully understand that he will die if he does not have the AKA. After discussing it amongst themselves, they have opted for  comfort care. - Palliative care has transitioned him to full comfort. I appreciate palliative care's assistance. - on Morphine infusion per palliative for his pain- he remains quite alert - [facility] bed available today  Active Problems:  NSTEMI - incidental finding on EKG - inferior leads revealed  T wave inversions -  he did not have any chest pain - Trop max 4,391     Atrial fibrillation - remains rate controlled   Mild compression fracture of T8  - noted on CTA - he has been having back pain for a few weeks now per family   Dementia - per daughter he has short term memory loss but he has been able to remember what his doctors have told him here in the hospital (in regards to plan and prognosis)    Hyperlipidemia - cont Crestor    Essential hypertension - cont Cozaar   Discharge Exam: Vitals:   08/28/20 2323 08/30/20 0355  BP: 127/76 (!) 158/86  Pulse: 75 87  Resp: 17 18  Temp: 97.8 F (36.6 C) 97.6 F (36.4 C)  SpO2: 90% 94%   Vitals:   08/28/20 2200 08/28/20 2305 08/28/20 2323 08/30/20 0355  BP:  131/65 127/76 (!) 158/86  Pulse: 78 78 75 87  Resp: (!) 9 12 17 18   Temp:   97.8 F (36.6 C) 97.6 F (36.4 C)  TempSrc:    Oral  SpO2: 93% 92% 90% 94%  Weight:      Height:        General: Pt is alert, awake, not  in acute distress Cardiovascular: RRR, S1/S2 +, no rubs, no gallops Respiratory: CTA bilaterally, no wheezing, no rhonchi Abdominal: Soft, NT, ND, bowel sounds + Extremities: no edema, no cyanosis   Discharge Instructions   Allergies as of 08/30/2020   No Known Allergies     Medication List    STOP taking these medications   colchicine 0.6 MG tablet   Eliquis 5 MG Tabs tablet Generic drug: apixaban   hydroxypropyl methylcellulose / hypromellose 2.5 % ophthalmic solution Commonly known as: ISOPTO TEARS / GONIOVISC   metoprolol tartrate 25 MG tablet Commonly known as: LOPRESSOR   polyethylene glycol 17 g packet Commonly  known as: MIRALAX / GLYCOLAX   rosuvastatin 5 MG tablet Commonly known as: CRESTOR   Ultram 50 MG tablet Generic drug: traMADol     TAKE these medications   ibuprofen 200 MG tablet Commonly known as: ADVIL Take 200 mg by mouth every 6 (six) hours as needed for mild pain or headache. What changed: Another medication with the same name was removed. Continue taking this medication, and follow the directions you see here.   morphine 1 mg/mL Soln infusion Inject 2-10 mg/hr into the vein continuous.   morphine 5 mg/mL Soln Inject 2 mg into the vein every 30 (thirty) minutes as needed (dyspnea).   zolpidem 5 MG tablet Commonly known as: AMBIEN Take 1 tablet (5 mg total) by mouth at bedtime as needed for sleep. What changed:   medication strength  how much to take       No Known Allergies    CT ANGIO CHEST PE W OR WO CONTRAST  Result Date: 08/27/2020 CLINICAL DATA:  Pt to ED for mottled, cold LLE; Pt has no palpable or Doppler pulses; No options for re-vascularization per Vascular. Hx of A-Fib EXAM: CT ANGIOGRAPHY CHEST WITH CONTRAST TECHNIQUE: Multidetector CT imaging of the chest was performed using the standard protocol during bolus administration of intravenous contrast. Multiplanar CT image reconstructions and MIPs were obtained to evaluate the vascular anatomy. CONTRAST:  181mL OMNIPAQUE IOHEXOL 350 MG/ML SOLN COMPARISON:  07/07/2020 FINDINGS: Cardiovascular: Pulmonary arteries well opacified. Study mildly limited due to respiratory motion. Allowing for the motion limitation, there is no evidence of a pulmonary embolism. Heart top-normal in size. Mild left coronary artery calcifications. No pericardial effusion. Thoracic aorta not opacified. Aorta normal in caliber. Mild atherosclerotic calcifications. Mediastinum/Nodes: No enlarged mediastinal, hilar, or axillary lymph nodes. Thyroid gland, trachea, and esophagus demonstrate no significant findings. Lungs/Pleura: Mild dependent  subsegmental atelectasis. No evidence of pneumonia or pulmonary edema. No mass or suspicious nodule. No pleural effusion or pneumothorax. Atelectasis improved from the prior study. Upper Abdomen: No acute findings. Musculoskeletal: Mild compression fracture of T8, with a fracture line visible cross the anterior lower endplate, associated with sclerosis, new since the prior CT. Mild to moderate chronic compression fracture of T4, unchanged. No other fractures. No bone lesions. Review of the MIP images confirms the above findings. IMPRESSION: 1. No evidence of a pulmonary embolism. Study mildly degraded by respiratory motion. 2. Mild compression fracture of T8 that is new when compared to the prior CT, but of unclear acuity. 3. No other evidence of an acute or recent abnormality on the chest CT. 4. Mild dependent atelectasis in the lungs improved compared to the prior CT. No evidence of pneumonia or pulmonary edema. 5. Aortic atherosclerosis. Aortic Atherosclerosis (ICD10-I70.0). Electronically Signed   By: Lajean Manes M.D.   On: 08/27/2020 12:29   ECHOCARDIOGRAM COMPLETE  Result Date: 08/27/2020  ECHOCARDIOGRAM REPORT   Patient Name:   Christian Maxwell Date of Exam: 08/27/2020 Medical Rec #:  [id number]       Height:       68.0 in Accession #:    [id number]      Weight:       150.0 lb Date of Birth:  January 16, 1933       BSA:          1.809 m Patient Age:    45 years        BP:           154/112 mmHg Patient Gender: M               HR:           86 bpm. Exam Location:  Inpatient Procedure: 2D Echo, Cardiac Doppler and Color Doppler Indications:    Atrial fibrillation  History:        Patient has prior history of Echocardiogram examinations, most                 recent 01/21/2018. Previous Myocardial Infarction, Arrythmias:PVC                 and Atrial Fibrillation, Signs/Symptoms:Syncope; Risk                 Factors:Hypertension and Dyslipidemia.  Sonographer:    Luisa Hart RDCS Referring Phys: [id number] [place]  1. Left ventricular ejection fraction, by estimation, is 55 to 60%. The left ventricle has normal function. The left ventricle has no regional wall motion abnormalities. There is mild concentric left ventricular hypertrophy. Left ventricular diastolic function could not be evaluated.  2. Right ventricular systolic function is mildly reduced. The right ventricular size is normal. There is normal pulmonary artery systolic pressure.  3. Left atrial size was severely dilated.  4. The mitral valve is grossly normal. Trivial mitral valve regurgitation.  5. The aortic valve is tricuspid. There is mild calcification of the aortic valve. There is mild thickening of the aortic valve. Aortic valve regurgitation is mild. Mild aortic valve sclerosis is present, with no evidence of aortic valve stenosis.  6. Aortic dilatation noted. There is borderline dilatation of the ascending aorta, measuring 38 mm.  7. The inferior vena cava is normal in size with greater than 50% respiratory variability, suggesting right atrial pressure of 3 mmHg. Comparison(s): No significant change from prior study. Conclusion(s)/Recommendation(s): Otherwise normal echocardiogram, with minor abnormalities described in the report. FINDINGS  Left Ventricle: Left ventricular ejection fraction, by estimation, is 55 to 60%. The left ventricle has normal function. The left ventricle has no regional wall motion abnormalities. The left ventricular internal cavity size was small. There is mild concentric left ventricular hypertrophy. Abnormal (paradoxical) septal motion, consistent with left bundle branch block. Left ventricular diastolic function could not be evaluated due to atrial fibrillation. Left ventricular diastolic function could not be evaluated. Right Ventricle: The right ventricular size is normal. Right vetricular wall thickness was not well visualized. Right ventricular systolic function is mildly reduced. There is normal  pulmonary artery systolic pressure. The tricuspid regurgitant velocity is 2.44 m/s, and with an assumed right atrial pressure of 3 mmHg, the estimated right ventricular systolic pressure is 93.2 mmHg. Left Atrium: Left atrial size was severely dilated. Right Atrium: Right atrial size was normal in size. Pericardium: There is no evidence of pericardial effusion. Mitral Valve: The mitral valve is grossly normal. Trivial mitral valve regurgitation. Tricuspid Valve:  The tricuspid valve is normal in structure. Tricuspid valve regurgitation is mild. Aortic Valve: The aortic valve is tricuspid. There is mild calcification of the aortic valve. There is mild thickening of the aortic valve. Aortic valve regurgitation is mild. Aortic regurgitation PHT measures 636 msec. Mild aortic valve sclerosis is present, with no evidence of aortic valve stenosis. Aortic valve mean gradient measures 2.0 mmHg. Aortic valve peak gradient measures 3.6 mmHg. Aortic valve area, by VTI measures 2.46 cm. Pulmonic Valve: The pulmonic valve was not well visualized. Pulmonic valve regurgitation is not visualized. No evidence of pulmonic stenosis. Aorta: Aortic dilatation noted. There is borderline dilatation of the ascending aorta, measuring 38 mm. Venous: The inferior vena cava is normal in size with greater than 50% respiratory variability, suggesting right atrial pressure of 3 mmHg. IAS/Shunts: The atrial septum is grossly normal.  LEFT VENTRICLE PLAX 2D LVIDd:         5.10 cm LVIDs:         2.80 cm LV PW:         1.20 cm LV IVS:        1.00 cm LVOT diam:     1.90 cm LV SV:         37 LV SV Index:   20 LVOT Area:     2.84 cm  LV Volumes (MOD) LV vol d, MOD A2C: 28.9 ml LV vol d, MOD A4C: 39.6 ml LV vol s, MOD A2C: 17.8 ml LV vol s, MOD A4C: 26.5 ml LV SV MOD A2C:     11.1 ml LV SV MOD A4C:     39.6 ml LV SV MOD BP:      12.2 ml RIGHT VENTRICLE RV S prime:     5.70 cm/s TAPSE (M-mode): 1.3 cm LEFT ATRIUM              Index       RIGHT ATRIUM            Index LA Vol (A2C):   34.6 ml  19.13 ml/m RA Area:     11.50 cm LA Vol (A4C):   108.0 ml 59.72 ml/m RA Volume:   17.40 ml  9.62 ml/m LA Biplane Vol: 61.5 ml  34.01 ml/m  AORTIC VALVE                   PULMONIC VALVE AV Area (Vmax):    2.41 cm    PV Vmax:       1.00 m/s AV Area (Vmean):   2.58 cm    PV Vmean:      72.000 cm/s AV Area (VTI):     2.46 cm    PV VTI:        0.162 m AV Vmax:           94.90 cm/s  PV Peak grad:  4.0 mmHg AV Vmean:          61.800 cm/s PV Mean grad:  2.5 mmHg AV VTI:            0.150 m AV Peak Grad:      3.6 mmHg AV Mean Grad:      2.0 mmHg LVOT Vmax:         80.80 cm/s LVOT Vmean:        56.200 cm/s LVOT VTI:          0.130 m LVOT/AV VTI ratio: 0.87 AI PHT:  636 msec  AORTA Ao Root diam: 3.40 cm Ao Asc diam:  3.80 cm MITRAL VALVE               TRICUSPID VALVE MV Area (PHT): 3.77 cm    TR Peak grad:   23.8 mmHg MV Decel Time: 201 msec    TR Vmax:        244.00 cm/s MV E velocity: 67.80 cm/s                            SHUNTS                            Systemic VTI:  0.13 m                            Systemic Diam: 1.90 cm Buford Dresser MD Electronically signed by Buford Dresser MD Signature Date/Time: 08/27/2020/1:18:20 PM    Final      The results of significant diagnostics from this hospitalization (including imaging, microbiology, ancillary and laboratory) are listed below for reference.     Microbiology: Recent Results (from the past 240 hour(s))  Resp Panel by RT-PCR (Flu A&B, Covid) Nasopharyngeal Swab     Status: None   Collection Time: 08/27/20  6:59 AM   Specimen: Nasopharyngeal Swab; Nasopharyngeal(NP) swabs in vial transport medium  Result Value Ref Range Status   SARS Coronavirus 2 by RT PCR NEGATIVE NEGATIVE Final    Comment: (NOTE) SARS-CoV-2 target nucleic acids are NOT DETECTED.  The SARS-CoV-2 RNA is generally detectable in upper respiratory specimens during the acute phase of infection. The lowest concentration of  SARS-CoV-2 viral copies this assay can detect is 138 copies/mL. A negative result does not preclude SARS-Cov-2 infection and should not be used as the sole basis for treatment or other patient management decisions. A negative result may occur with  improper specimen collection/handling, submission of specimen other than nasopharyngeal swab, presence of viral mutation(s) within the areas targeted by this assay, and inadequate number of viral copies(<138 copies/mL). A negative result must be combined with clinical observations, patient history, and epidemiological information. The expected result is Negative.  Fact Sheet for Patients:  EntrepreneurPulse.com.au  Fact Sheet for Healthcare Providers:  IncredibleEmployment.be  This test is no t yet approved or cleared by the [place] FDA and  has been authorized for detection and/or diagnosis of SARS-CoV-2 by FDA under an Emergency Use Authorization (EUA). This EUA will remain  in effect (meaning this test can be used) for the duration of the COVID-19 declaration under Section 564(b)(1) of the Act, 21 U.S.C.section 360bbb-3(b)(1), unless the authorization is terminated  or revoked sooner.       Influenza A by PCR NEGATIVE NEGATIVE Final   Influenza B by PCR NEGATIVE NEGATIVE Final    Comment: (NOTE) The Xpert Xpress SARS-CoV-2/FLU/RSV plus assay is intended as an aid in the diagnosis of influenza from Nasopharyngeal swab specimens and should not be used as a sole basis for treatment. Nasal washings and aspirates are unacceptable for Xpert Xpress SARS-CoV-2/FLU/RSV testing.  Fact Sheet for Patients: EntrepreneurPulse.com.au  Fact Sheet for Healthcare Providers: IncredibleEmployment.be  This test is not yet approved or cleared by the [place] FDA and has been authorized for detection and/or diagnosis of SARS-CoV-2 by FDA under an Emergency Use  Authorization (EUA). This EUA will remain in effect (meaning this  test can be used) for the duration of the COVID-19 declaration under Section 564(b)(1) of the Act, 21 U.S.C. section 360bbb-3(b)(1), unless the authorization is terminated or revoked.  Performed at [facility], [place] [address], [place], [place] [address]      Labs: BNP (last 3 results) No results for input(s): BNP in the last 8760 hours. Basic Metabolic Panel: Recent Labs  Lab 08/27/20 0645 08/28/20 0302  NA 142 145  K 3.5 3.7  CL 108 108  CO2 28 27  GLUCOSE 99 83  BUN 34* 31*  CREATININE 0.92 0.88  CALCIUM 10.0 9.7   Liver Function Tests: No results for input(s): AST, ALT, ALKPHOS, BILITOT, PROT, ALBUMIN in the last 168 hours. No results for input(s): LIPASE, AMYLASE in the last 168 hours. No results for input(s): AMMONIA in the last 168 hours. CBC: Recent Labs  Lab 08/27/20 0645 08/28/20 0302  WBC 9.9 10.1  NEUTROABS 7.8*  --   HGB 15.6 15.4  HCT 49.4 49.1  MCV 83.7 83.6  PLT 166 176   Cardiac Enzymes: No results for input(s): CKTOTAL, CKMB, CKMBINDEX, TROPONINI in the last 168 hours. BNP: Invalid input(s): POCBNP CBG: No results for input(s): GLUCAP in the last 168 hours. D-Dimer No results for input(s): DDIMER in the last 72 hours. Hgb A1c No results for input(s): HGBA1C in the last 72 hours. Lipid Profile No results for input(s): CHOL, HDL, LDLCALC, TRIG, CHOLHDL, LDLDIRECT in the last 72 hours. Thyroid function studies No results for input(s): TSH, T4TOTAL, T3FREE, THYROIDAB in the last 72 hours.  Invalid input(s): FREET3 Anemia work up No results for input(s): VITAMINB12, FOLATE, FERRITIN, TIBC, IRON, RETICCTPCT in the last 72 hours. Urinalysis    Component Value Date/Time   COLORURINE YELLOW 07/22/2020 0602   APPEARANCEUR CLEAR 07/22/2020 0602   LABSPEC 1.019 07/22/2020 0602   PHURINE 5.0 07/22/2020 0602   GLUCOSEU NEGATIVE 07/22/2020 0602   HGBUR NEGATIVE 07/22/2020  0602   BILIRUBINUR NEGATIVE 07/22/2020 0602   KETONESUR NEGATIVE 07/22/2020 0602   PROTEINUR NEGATIVE 07/22/2020 0602   UROBILINOGEN 0.2 07/17/2012 0950   NITRITE NEGATIVE 07/22/2020 0602   LEUKOCYTESUR NEGATIVE 07/22/2020 0602   Sepsis Labs Invalid input(s): PROCALCITONIN,  WBC,  LACTICIDVEN Microbiology Recent Results (from the past 240 hour(s))  Resp Panel by RT-PCR (Flu A&B, Covid) Nasopharyngeal Swab     Status: None   Collection Time: 08/27/20  6:59 AM   Specimen: Nasopharyngeal Swab; Nasopharyngeal(NP) swabs in vial transport medium  Result Value Ref Range Status   SARS Coronavirus 2 by RT PCR NEGATIVE NEGATIVE Final    Comment: (NOTE) SARS-CoV-2 target nucleic acids are NOT DETECTED.  The SARS-CoV-2 RNA is generally detectable in upper respiratory specimens during the acute phase of infection. The lowest concentration of SARS-CoV-2 viral copies this assay can detect is 138 copies/mL. A negative result does not preclude SARS-Cov-2 infection and should not be used as the sole basis for treatment or other patient management decisions. A negative result may occur with  improper specimen collection/handling, submission of specimen other than nasopharyngeal swab, presence of viral mutation(s) within the areas targeted by this assay, and inadequate number of viral copies(<138 copies/mL). A negative result must be combined with clinical observations, patient history, and epidemiological information. The expected result is Negative.  Fact Sheet for Patients:  EntrepreneurPulse.com.au  Fact Sheet for Healthcare Providers:  IncredibleEmployment.be  This test is no t yet approved or cleared by the [place] FDA and  has been authorized for detection  and/or diagnosis of SARS-CoV-2 by FDA under an Emergency Use Authorization (EUA). This EUA will remain  in effect (meaning this test can be used) for the duration of the COVID-19 declaration  under Section 564(b)(1) of the Act, 21 U.S.C.section 360bbb-3(b)(1), unless the authorization is terminated  or revoked sooner.       Influenza A by PCR NEGATIVE NEGATIVE Final   Influenza B by PCR NEGATIVE NEGATIVE Final    Comment: (NOTE) The Xpert Xpress SARS-CoV-2/FLU/RSV plus assay is intended as an aid in the diagnosis of influenza from Nasopharyngeal swab specimens and should not be used as a sole basis for treatment. Nasal washings and aspirates are unacceptable for Xpert Xpress SARS-CoV-2/FLU/RSV testing.  Fact Sheet for Patients: EntrepreneurPulse.com.au  Fact Sheet for Healthcare Providers: IncredibleEmployment.be  This test is not yet approved or cleared by the [place] FDA and has been authorized for detection and/or diagnosis of SARS-CoV-2 by FDA under an Emergency Use Authorization (EUA). This EUA will remain in effect (meaning this test can be used) for the duration of the COVID-19 declaration under Section 564(b)(1) of the Act, 21 U.S.C. section 360bbb-3(b)(1), unless the authorization is terminated or revoked.  Performed at [facility], [place] [address] [place], [place] [address]      Time coordinating discharge in minutes: 65  SIGNED:   Debbe Odea, MD  [facility] 08/30/2020, 11:00 AM

## 2020-09-01 ENCOUNTER — Ambulatory Visit: Payer: Medicare Other | Admitting: Adult Health

## 2020-09-09 ENCOUNTER — Ambulatory Visit (HOSPITAL_COMMUNITY): Payer: Medicare Other | Attending: Cardiology

## 2020-09-09 ENCOUNTER — Telehealth (HOSPITAL_COMMUNITY): Payer: Self-pay | Admitting: Physician Assistant

## 2020-09-09 NOTE — Telephone Encounter (Signed)
Will send this message to the ordering Provider, Richardson Dopp PA-C, as a general FYI.

## 2020-09-09 NOTE — Telephone Encounter (Signed)
Just an FYI. We have made several attempts to contact this patient including sending a letter to schedule or reschedule their echocardiogram. We will be removing the patient from the echo [place].   09/09/20 NO SHOWED x2 09/09/20-no letter mailed Armanda Heritage  07/14/20 NO SHOW -MAILED LETTER [facility]   Thank you

## 2020-09-12 DEATH — deceased
